# Patient Record
Sex: Female | Born: 1965 | Race: Black or African American | Hispanic: No | Marital: Single | Smoking: Current every day smoker
Health system: Southern US, Community
[De-identification: ages and names within clinical notes are randomized; demographics above are authoritative.]

## PROBLEM LIST (undated history)

## (undated) DIAGNOSIS — R569 Unspecified convulsions: Secondary | ICD-10-CM

## (undated) DIAGNOSIS — I639 Cerebral infarction, unspecified: Secondary | ICD-10-CM

## (undated) DIAGNOSIS — H539 Unspecified visual disturbance: Secondary | ICD-10-CM

## (undated) DIAGNOSIS — B20 Human immunodeficiency virus [HIV] disease: Secondary | ICD-10-CM

## (undated) DIAGNOSIS — Z21 Asymptomatic human immunodeficiency virus [HIV] infection status: Secondary | ICD-10-CM

## (undated) DIAGNOSIS — I10 Essential (primary) hypertension: Secondary | ICD-10-CM

## (undated) HISTORY — DX: Asymptomatic human immunodeficiency virus (hiv) infection status: Z21

## (undated) HISTORY — DX: Unspecified visual disturbance: H53.9

## (undated) HISTORY — DX: Essential (primary) hypertension: I10

## (undated) HISTORY — DX: Human immunodeficiency virus (HIV) disease: B20

## (undated) HISTORY — DX: Cerebral infarction, unspecified: I63.9

## (undated) HISTORY — DX: Unspecified convulsions: R56.9

---

## 1999-03-02 ENCOUNTER — Ambulatory Visit (HOSPITAL_COMMUNITY): Admission: RE | Admit: 1999-03-02 | Discharge: 1999-03-02 | Payer: Self-pay | Admitting: *Deleted

## 1999-03-14 ENCOUNTER — Encounter: Admission: RE | Admit: 1999-03-14 | Discharge: 1999-03-14 | Payer: Self-pay | Admitting: Obstetrics & Gynecology

## 1999-03-28 ENCOUNTER — Encounter: Admission: RE | Admit: 1999-03-28 | Discharge: 1999-03-28 | Payer: Self-pay | Admitting: Obstetrics & Gynecology

## 1999-04-11 ENCOUNTER — Encounter: Admission: RE | Admit: 1999-04-11 | Discharge: 1999-04-11 | Payer: Self-pay | Admitting: Obstetrics & Gynecology

## 1999-04-17 ENCOUNTER — Ambulatory Visit (HOSPITAL_COMMUNITY): Admission: RE | Admit: 1999-04-17 | Discharge: 1999-04-17 | Payer: Self-pay | Admitting: *Deleted

## 1999-04-25 ENCOUNTER — Inpatient Hospital Stay (HOSPITAL_COMMUNITY): Admission: AD | Admit: 1999-04-25 | Discharge: 1999-04-25 | Payer: Self-pay | Admitting: Obstetrics

## 1999-04-25 ENCOUNTER — Encounter (HOSPITAL_COMMUNITY): Admission: RE | Admit: 1999-04-25 | Discharge: 1999-05-14 | Payer: Self-pay | Admitting: Obstetrics & Gynecology

## 1999-04-25 ENCOUNTER — Encounter: Admission: RE | Admit: 1999-04-25 | Discharge: 1999-04-25 | Payer: Self-pay | Admitting: Obstetrics & Gynecology

## 1999-04-26 ENCOUNTER — Inpatient Hospital Stay (HOSPITAL_COMMUNITY): Admission: AD | Admit: 1999-04-26 | Discharge: 1999-04-26 | Payer: Self-pay | Admitting: Obstetrics & Gynecology

## 1999-04-30 ENCOUNTER — Inpatient Hospital Stay (HOSPITAL_COMMUNITY): Admission: AD | Admit: 1999-04-30 | Discharge: 1999-04-30 | Payer: Self-pay | Admitting: Obstetrics

## 1999-05-09 ENCOUNTER — Encounter: Admission: RE | Admit: 1999-05-09 | Discharge: 1999-05-09 | Payer: Self-pay | Admitting: Obstetrics & Gynecology

## 1999-05-12 ENCOUNTER — Inpatient Hospital Stay (HOSPITAL_COMMUNITY): Admission: AD | Admit: 1999-05-12 | Discharge: 1999-05-14 | Payer: Self-pay | Admitting: *Deleted

## 1999-05-12 ENCOUNTER — Inpatient Hospital Stay (HOSPITAL_COMMUNITY): Admission: AD | Admit: 1999-05-12 | Discharge: 1999-05-12 | Payer: Self-pay | Admitting: *Deleted

## 1999-05-12 ENCOUNTER — Encounter: Payer: Self-pay | Admitting: *Deleted

## 2000-02-14 ENCOUNTER — Emergency Department (HOSPITAL_COMMUNITY): Admission: EM | Admit: 2000-02-14 | Discharge: 2000-02-14 | Payer: Self-pay | Admitting: Emergency Medicine

## 2001-05-24 ENCOUNTER — Emergency Department (HOSPITAL_COMMUNITY): Admission: EM | Admit: 2001-05-24 | Discharge: 2001-05-24 | Payer: Self-pay | Admitting: Emergency Medicine

## 2005-04-04 ENCOUNTER — Encounter: Admission: RE | Admit: 2005-04-04 | Discharge: 2005-04-04 | Payer: Self-pay | Admitting: Obstetrics & Gynecology

## 2005-08-14 ENCOUNTER — Emergency Department (HOSPITAL_COMMUNITY): Admission: EM | Admit: 2005-08-14 | Discharge: 2005-08-14 | Payer: Self-pay | Admitting: Family Medicine

## 2005-09-26 ENCOUNTER — Emergency Department (HOSPITAL_COMMUNITY): Admission: EM | Admit: 2005-09-26 | Discharge: 2005-09-26 | Payer: Self-pay | Admitting: Family Medicine

## 2007-01-20 ENCOUNTER — Emergency Department (HOSPITAL_COMMUNITY): Admission: EM | Admit: 2007-01-20 | Discharge: 2007-01-20 | Payer: Self-pay | Admitting: Family Medicine

## 2007-01-30 ENCOUNTER — Ambulatory Visit: Payer: Self-pay | Admitting: Family Medicine

## 2007-01-30 ENCOUNTER — Encounter: Payer: Self-pay | Admitting: Internal Medicine

## 2007-01-30 LAB — CONVERTED CEMR LAB
HCV Ab: NEGATIVE
Hep B S Ab: NEGATIVE
Hepatitis B Surface Ag: NEGATIVE

## 2007-02-02 ENCOUNTER — Ambulatory Visit: Payer: Self-pay | Admitting: *Deleted

## 2007-02-19 ENCOUNTER — Encounter (INDEPENDENT_AMBULATORY_CARE_PROVIDER_SITE_OTHER): Payer: Self-pay | Admitting: Specialist

## 2007-02-19 ENCOUNTER — Encounter: Payer: Self-pay | Admitting: Internal Medicine

## 2007-02-19 ENCOUNTER — Ambulatory Visit: Payer: Self-pay | Admitting: Family Medicine

## 2007-02-19 ENCOUNTER — Encounter (INDEPENDENT_AMBULATORY_CARE_PROVIDER_SITE_OTHER): Payer: Self-pay | Admitting: Family Medicine

## 2007-02-19 LAB — CONVERTED CEMR LAB: Pap Smear: ABNORMAL

## 2007-03-12 ENCOUNTER — Ambulatory Visit: Payer: Self-pay | Admitting: Internal Medicine

## 2007-04-16 ENCOUNTER — Ambulatory Visit: Payer: Self-pay | Admitting: Internal Medicine

## 2007-05-30 ENCOUNTER — Emergency Department (HOSPITAL_COMMUNITY): Admission: EM | Admit: 2007-05-30 | Discharge: 2007-05-31 | Payer: Self-pay | Admitting: Emergency Medicine

## 2007-08-06 ENCOUNTER — Encounter: Payer: Self-pay | Admitting: Internal Medicine

## 2007-08-06 DIAGNOSIS — I10 Essential (primary) hypertension: Secondary | ICD-10-CM | POA: Insufficient documentation

## 2007-08-06 DIAGNOSIS — B20 Human immunodeficiency virus [HIV] disease: Secondary | ICD-10-CM | POA: Insufficient documentation

## 2007-09-02 ENCOUNTER — Encounter (INDEPENDENT_AMBULATORY_CARE_PROVIDER_SITE_OTHER): Payer: Self-pay | Admitting: *Deleted

## 2007-09-08 ENCOUNTER — Ambulatory Visit (HOSPITAL_COMMUNITY): Admission: RE | Admit: 2007-09-08 | Discharge: 2007-09-08 | Payer: Self-pay | Admitting: Family Medicine

## 2007-12-21 ENCOUNTER — Emergency Department (HOSPITAL_COMMUNITY): Admission: EM | Admit: 2007-12-21 | Discharge: 2007-12-21 | Payer: Self-pay | Admitting: Family Medicine

## 2007-12-24 ENCOUNTER — Ambulatory Visit: Payer: Self-pay | Admitting: Internal Medicine

## 2007-12-24 LAB — CONVERTED CEMR LAB
ALT: 55 units/L — ABNORMAL HIGH (ref 0–35)
AST: 49 units/L — ABNORMAL HIGH (ref 0–37)
Absolute CD4: 179 #/uL — ABNORMAL LOW (ref 381–1469)
Albumin: 3.7 g/dL (ref 3.5–5.2)
Alkaline Phosphatase: 67 units/L (ref 39–117)
BUN: 12 mg/dL (ref 6–23)
Basophils Absolute: 0 10*3/uL (ref 0.0–0.1)
Basophils Relative: 0 % (ref 0–1)
CD4 T Helper %: 9 % — ABNORMAL LOW (ref 32–62)
CO2: 22 meq/L (ref 19–32)
Calcium: 8.6 mg/dL (ref 8.4–10.5)
Chloride: 106 meq/L (ref 96–112)
Creatinine, Ser: 0.62 mg/dL (ref 0.40–1.20)
Eosinophils Absolute: 0.1 10*3/uL (ref 0.0–0.7)
Eosinophils Relative: 3 % (ref 0–5)
Glucose, Bld: 91 mg/dL (ref 70–99)
HCT: 40.3 % (ref 36.0–46.0)
HIV 1 RNA Quant: 101000 copies/mL — ABNORMAL HIGH (ref <50)
HIV-1 RNA Quant, Log: 5 — ABNORMAL HIGH (ref <1.70)
Hemoglobin: 13.2 g/dL (ref 12.0–15.0)
Lymphocytes Relative: 71 % — ABNORMAL HIGH (ref 12–46)
Lymphs Abs: 2 10*3/uL (ref 0.7–4.0)
MCHC: 32.8 g/dL (ref 30.0–36.0)
MCV: 86.5 fL (ref 78.0–100.0)
Monocytes Absolute: 0.3 10*3/uL (ref 0.1–1.0)
Monocytes Relative: 10 % (ref 3–12)
Neutro Abs: 0.5 10*3/uL — ABNORMAL LOW (ref 1.7–7.7)
Neutrophils Relative %: 16 % — ABNORMAL LOW (ref 43–77)
Platelets: 152 10*3/uL (ref 150–400)
Potassium: 3.9 meq/L (ref 3.5–5.3)
RBC: 4.66 M/uL (ref 3.87–5.11)
RDW: 13.5 % (ref 11.5–15.5)
Sodium: 140 meq/L (ref 135–145)
Total Bilirubin: 0.5 mg/dL (ref 0.3–1.2)
Total Lymphocytes %: 71 % — ABNORMAL HIGH (ref 12–46)
Total Protein: 8.7 g/dL — ABNORMAL HIGH (ref 6.0–8.3)
Total lymphocyte count: 1988 cells/mcL (ref 700–3300)
WBC, lymph enumeration: 2.8 10*3/uL — ABNORMAL LOW (ref 4.0–10.5)
WBC: 2.8 10*3/uL — ABNORMAL LOW (ref 4.0–10.5)

## 2008-01-07 ENCOUNTER — Ambulatory Visit: Payer: Self-pay | Admitting: Internal Medicine

## 2008-04-03 ENCOUNTER — Emergency Department (HOSPITAL_COMMUNITY): Admission: EM | Admit: 2008-04-03 | Discharge: 2008-04-04 | Payer: Self-pay | Admitting: Emergency Medicine

## 2008-07-22 ENCOUNTER — Emergency Department (HOSPITAL_COMMUNITY): Admission: EM | Admit: 2008-07-22 | Discharge: 2008-07-22 | Payer: Self-pay | Admitting: Emergency Medicine

## 2008-12-22 ENCOUNTER — Ambulatory Visit: Payer: Self-pay | Admitting: Internal Medicine

## 2008-12-22 LAB — CONVERTED CEMR LAB

## 2008-12-23 ENCOUNTER — Encounter: Payer: Self-pay | Admitting: Internal Medicine

## 2008-12-23 LAB — CONVERTED CEMR LAB
ALT: 32 units/L (ref 0–35)
AST: 38 units/L — ABNORMAL HIGH (ref 0–37)
Absolute CD4: 130 #/uL — ABNORMAL LOW (ref 381–1469)
Albumin: 3.9 g/dL (ref 3.5–5.2)
Alkaline Phosphatase: 65 units/L (ref 39–117)
BUN: 9 mg/dL (ref 6–23)
Basophils Absolute: 0 10*3/uL (ref 0.0–0.1)
Basophils Relative: 0 % (ref 0–1)
CD4 T Helper %: 9 % — ABNORMAL LOW (ref 32–62)
CO2: 20 meq/L (ref 19–32)
Calcium: 8.6 mg/dL (ref 8.4–10.5)
Chlamydia, Swab/Urine, PCR: NEGATIVE
Chloride: 104 meq/L (ref 96–112)
Creatinine, Ser: 0.74 mg/dL (ref 0.40–1.20)
Eosinophils Absolute: 0.1 10*3/uL (ref 0.0–0.7)
Eosinophils Relative: 5 % (ref 0–5)
GC Probe Amp, Urine: NEGATIVE
Glucose, Bld: 83 mg/dL (ref 70–99)
HCT: 40.9 % (ref 36.0–46.0)
HIV 1 RNA Quant: 269000 copies/mL — ABNORMAL HIGH (ref <48)
HIV-1 RNA Quant, Log: 5.43 — ABNORMAL HIGH (ref <1.68)
Hemoglobin: 13.7 g/dL (ref 12.0–15.0)
Lymphocytes Relative: 60 % — ABNORMAL HIGH (ref 12–46)
Lymphs Abs: 1.4 10*3/uL (ref 0.7–4.0)
MCHC: 33.5 g/dL (ref 30.0–36.0)
MCV: 83.5 fL (ref 78.0–100.0)
Monocytes Absolute: 0.2 10*3/uL (ref 0.1–1.0)
Monocytes Relative: 8 % (ref 3–12)
Neutro Abs: 0.7 10*3/uL — ABNORMAL LOW (ref 1.7–7.7)
Neutrophils Relative %: 28 % — ABNORMAL LOW (ref 43–77)
Platelets: 140 10*3/uL — ABNORMAL LOW (ref 150–400)
Potassium: 3.8 meq/L (ref 3.5–5.3)
RBC: 4.9 M/uL (ref 3.87–5.11)
RDW: 13.2 % (ref 11.5–15.5)
Sodium: 139 meq/L (ref 135–145)
Total Bilirubin: 0.7 mg/dL (ref 0.3–1.2)
Total Lymphocytes %: 60 % — ABNORMAL HIGH (ref 12–46)
Total Protein: 8.9 g/dL — ABNORMAL HIGH (ref 6.0–8.3)
Total lymphocyte count: 1440 cells/mcL (ref 700–3300)
WBC, lymph enumeration: 2.4 10*3/uL — ABNORMAL LOW (ref 4.0–10.5)
WBC: 2.4 10*3/uL — ABNORMAL LOW (ref 4.0–10.5)

## 2009-01-19 ENCOUNTER — Encounter: Payer: Self-pay | Admitting: Internal Medicine

## 2009-01-19 ENCOUNTER — Ambulatory Visit: Payer: Self-pay | Admitting: Internal Medicine

## 2009-01-19 DIAGNOSIS — J209 Acute bronchitis, unspecified: Secondary | ICD-10-CM | POA: Insufficient documentation

## 2009-03-07 ENCOUNTER — Ambulatory Visit: Payer: Self-pay | Admitting: Internal Medicine

## 2009-03-07 ENCOUNTER — Encounter: Payer: Self-pay | Admitting: Internal Medicine

## 2009-03-16 ENCOUNTER — Encounter: Payer: Self-pay | Admitting: Internal Medicine

## 2009-04-08 ENCOUNTER — Emergency Department (HOSPITAL_COMMUNITY): Admission: EM | Admit: 2009-04-08 | Discharge: 2009-04-08 | Payer: Self-pay | Admitting: Family Medicine

## 2009-04-20 ENCOUNTER — Encounter: Payer: Self-pay | Admitting: Internal Medicine

## 2009-12-28 ENCOUNTER — Ambulatory Visit: Payer: Self-pay | Admitting: Internal Medicine

## 2009-12-28 ENCOUNTER — Encounter: Payer: Self-pay | Admitting: Internal Medicine

## 2009-12-28 DIAGNOSIS — B370 Candidal stomatitis: Secondary | ICD-10-CM | POA: Insufficient documentation

## 2009-12-28 LAB — CONVERTED CEMR LAB
ALT: 14 units/L (ref 0–35)
AST: 23 units/L (ref 0–37)
Absolute CD4: 186 #/uL — ABNORMAL LOW (ref 381–1469)
Albumin: 3.7 g/dL (ref 3.5–5.2)
Alkaline Phosphatase: 65 units/L (ref 39–117)
BUN: 11 mg/dL (ref 6–23)
Basophils Absolute: 0.1 10*3/uL (ref 0.0–0.1)
Basophils Relative: 1 % (ref 0–1)
CD4 T Helper %: 12 % — ABNORMAL LOW (ref 32–62)
CO2: 25 meq/L (ref 19–32)
Calcium: 8.7 mg/dL (ref 8.4–10.5)
Chloride: 103 meq/L (ref 96–112)
Creatinine, Ser: 0.66 mg/dL (ref 0.40–1.20)
Eosinophils Absolute: 0.1 10*3/uL (ref 0.0–0.7)
Eosinophils Relative: 2 % (ref 0–5)
Glucose, Bld: 81 mg/dL (ref 70–99)
HCT: 43.2 % (ref 36.0–46.0)
HIV 1 RNA Quant: 126000 copies/mL — ABNORMAL HIGH (ref <48)
HIV-1 RNA Quant, Log: 5.1 — ABNORMAL HIGH (ref <1.68)
Hemoglobin: 14.7 g/dL (ref 12.0–15.0)
Lymphocytes Relative: 50 % — ABNORMAL HIGH (ref 12–46)
Lymphs Abs: 1.6 10*3/uL (ref 0.7–4.0)
MCHC: 34 g/dL (ref 30.0–36.0)
MCV: 84.9 fL (ref 78.0–100.0)
Monocytes Absolute: 0.2 10*3/uL (ref 0.1–1.0)
Monocytes Relative: 6 % (ref 3–12)
Neutro Abs: 1.3 10*3/uL — ABNORMAL LOW (ref 1.7–7.7)
Neutrophils Relative %: 41 % — ABNORMAL LOW (ref 43–77)
Platelets: 161 10*3/uL (ref 150–400)
Potassium: 3.9 meq/L (ref 3.5–5.3)
RBC: 5.09 M/uL (ref 3.87–5.11)
RDW: 13.1 % (ref 11.5–15.5)
Sodium: 140 meq/L (ref 135–145)
Total Bilirubin: 0.7 mg/dL (ref 0.3–1.2)
Total Protein: 8.6 g/dL — ABNORMAL HIGH (ref 6.0–8.3)
Total lymphocyte count: 1550 cells/mcL (ref 700–3300)
WBC: 3.1 10*3/uL — ABNORMAL LOW (ref 4.0–10.5)

## 2010-01-11 ENCOUNTER — Encounter: Payer: Self-pay | Admitting: Internal Medicine

## 2010-01-11 ENCOUNTER — Ambulatory Visit: Payer: Self-pay | Admitting: Internal Medicine

## 2010-01-30 ENCOUNTER — Encounter: Admission: RE | Admit: 2010-01-30 | Discharge: 2010-01-30 | Payer: Self-pay | Admitting: Internal Medicine

## 2010-02-22 ENCOUNTER — Encounter: Payer: Self-pay | Admitting: Internal Medicine

## 2010-02-22 ENCOUNTER — Ambulatory Visit: Payer: Self-pay | Admitting: Internal Medicine

## 2010-02-22 LAB — CONVERTED CEMR LAB
ALT: 14 units/L (ref 0–35)
AST: 18 units/L (ref 0–37)
Absolute CD4: 226 #/uL — ABNORMAL LOW (ref 381–1469)
Albumin: 3.6 g/dL (ref 3.5–5.2)
Alkaline Phosphatase: 78 units/L (ref 39–117)
BUN: 11 mg/dL (ref 6–23)
Basophils Absolute: 0 10*3/uL (ref 0.0–0.1)
Basophils Relative: 1 % (ref 0–1)
CD4 T Helper %: 14 % — ABNORMAL LOW (ref 32–62)
CO2: 24 meq/L (ref 19–32)
Calcium: 9.2 mg/dL (ref 8.4–10.5)
Chloride: 105 meq/L (ref 96–112)
Creatinine, Ser: 0.65 mg/dL (ref 0.40–1.20)
Eosinophils Absolute: 0.2 10*3/uL (ref 0.0–0.7)
Eosinophils Relative: 5 % (ref 0–5)
Glucose, Bld: 64 mg/dL — ABNORMAL LOW (ref 70–99)
HCT: 41.4 % (ref 36.0–46.0)
HIV 1 RNA Quant: 73 copies/mL — ABNORMAL HIGH (ref <48)
HIV-1 RNA Quant, Log: 1.86 — ABNORMAL HIGH (ref <1.68)
Hemoglobin: 13.9 g/dL (ref 12.0–15.0)
Lymphocytes Relative: 49 % — ABNORMAL HIGH (ref 12–46)
Lymphs Abs: 1.6 10*3/uL (ref 0.7–4.0)
MCHC: 33.6 g/dL (ref 30.0–36.0)
MCV: 85.7 fL (ref 78.0–100.0)
Monocytes Absolute: 0.3 10*3/uL (ref 0.1–1.0)
Monocytes Relative: 10 % (ref 3–12)
Neutro Abs: 1.2 10*3/uL — ABNORMAL LOW (ref 1.7–7.7)
Neutrophils Relative %: 36 % — ABNORMAL LOW (ref 43–77)
Platelets: 209 10*3/uL (ref 150–400)
Potassium: 4.2 meq/L (ref 3.5–5.3)
RBC: 4.83 M/uL (ref 3.87–5.11)
RDW: 14.1 % (ref 11.5–15.5)
Sodium: 139 meq/L (ref 135–145)
Total Bilirubin: 0.5 mg/dL (ref 0.3–1.2)
Total Protein: 8.1 g/dL (ref 6.0–8.3)
Total lymphocyte count: 1617 cells/mcL (ref 700–3300)
WBC: 3.3 10*3/uL — ABNORMAL LOW (ref 4.0–10.5)

## 2010-05-11 ENCOUNTER — Telehealth: Payer: Self-pay | Admitting: Internal Medicine

## 2010-05-21 ENCOUNTER — Ambulatory Visit: Payer: Self-pay | Admitting: Internal Medicine

## 2010-05-21 DIAGNOSIS — L0291 Cutaneous abscess, unspecified: Secondary | ICD-10-CM | POA: Insufficient documentation

## 2010-05-21 DIAGNOSIS — L0390 Cellulitis, unspecified: Secondary | ICD-10-CM

## 2010-05-21 DIAGNOSIS — R21 Rash and other nonspecific skin eruption: Secondary | ICD-10-CM | POA: Insufficient documentation

## 2010-05-21 LAB — CONVERTED CEMR LAB
ALT: 31 units/L (ref 0–35)
AST: 36 units/L (ref 0–37)
Albumin: 3.9 g/dL (ref 3.5–5.2)
Alkaline Phosphatase: 84 units/L (ref 39–117)
BUN: 12 mg/dL (ref 6–23)
Basophils Absolute: 0 10*3/uL (ref 0.0–0.1)
Basophils Relative: 0 % (ref 0–1)
CO2: 26 meq/L (ref 19–32)
Calcium: 9.1 mg/dL (ref 8.4–10.5)
Chloride: 106 meq/L (ref 96–112)
Cholesterol: 140 mg/dL (ref 0–200)
Creatinine, Ser: 0.74 mg/dL (ref 0.40–1.20)
Eosinophils Absolute: 0.1 10*3/uL (ref 0.0–0.7)
Eosinophils Relative: 2 % (ref 0–5)
Glucose, Bld: 90 mg/dL (ref 70–99)
HCT: 41.3 % (ref 36.0–46.0)
HDL: 42 mg/dL (ref >39)
HIV 1 RNA Quant: <48 copies/mL (ref <48)
HIV-1 RNA Quant, Log: <1.68 (ref <1.68)
Hemoglobin: 13.9 g/dL (ref 12.0–15.0)
LDL Cholesterol: 78 mg/dL (ref 0–99)
Lymphocytes Relative: 36 % (ref 12–46)
Lymphs Abs: 1.4 10*3/uL (ref 0.7–4.0)
MCHC: 33.7 g/dL (ref 30.0–36.0)
MCV: 88.2 fL (ref 78.0–100.0)
Monocytes Absolute: 0.3 10*3/uL (ref 0.1–1.0)
Monocytes Relative: 9 % (ref 3–12)
Neutro Abs: 2.1 10*3/uL (ref 1.7–7.7)
Neutrophils Relative %: 54 % (ref 43–77)
Platelets: 213 10*3/uL (ref 150–400)
Potassium: 3.9 meq/L (ref 3.5–5.3)
RBC: 4.68 M/uL (ref 3.87–5.11)
RDW: 12.8 % (ref 11.5–15.5)
Sodium: 140 meq/L (ref 135–145)
Total Bilirubin: 1.1 mg/dL (ref 0.3–1.2)
Total CHOL/HDL Ratio: 3.3
Total Protein: 8.2 g/dL (ref 6.0–8.3)
Triglycerides: 100 mg/dL (ref <150)
VLDL: 20 mg/dL (ref 0–40)
WBC: 3.9 10*3/uL — ABNORMAL LOW (ref 4.0–10.5)

## 2010-05-22 ENCOUNTER — Encounter: Payer: Self-pay | Admitting: Internal Medicine

## 2010-05-24 ENCOUNTER — Telehealth: Payer: Self-pay | Admitting: Internal Medicine

## 2010-06-11 ENCOUNTER — Telehealth: Payer: Self-pay | Admitting: Internal Medicine

## 2010-07-11 ENCOUNTER — Encounter: Payer: Self-pay | Admitting: Internal Medicine

## 2010-07-16 ENCOUNTER — Telehealth: Payer: Self-pay | Admitting: Internal Medicine

## 2010-07-19 ENCOUNTER — Ambulatory Visit: Payer: Self-pay | Admitting: Internal Medicine

## 2010-07-19 DIAGNOSIS — N912 Amenorrhea, unspecified: Secondary | ICD-10-CM | POA: Insufficient documentation

## 2010-08-23 ENCOUNTER — Telehealth: Payer: Self-pay | Admitting: Internal Medicine

## 2010-08-29 ENCOUNTER — Ambulatory Visit: Payer: Self-pay | Admitting: Internal Medicine

## 2010-08-29 DIAGNOSIS — D239 Other benign neoplasm of skin, unspecified: Secondary | ICD-10-CM | POA: Insufficient documentation

## 2010-08-29 LAB — CONVERTED CEMR LAB
ALT: 21 units/L (ref 0–35)
AST: 25 units/L (ref 0–37)
Albumin: 4 g/dL (ref 3.5–5.2)
Alkaline Phosphatase: 88 units/L (ref 39–117)
BUN: 15 mg/dL (ref 6–23)
Basophils Absolute: 0 10*3/uL (ref 0.0–0.1)
Basophils Relative: 0 % (ref 0–1)
CO2: 25 meq/L (ref 19–32)
Calcium: 8.9 mg/dL (ref 8.4–10.5)
Chloride: 105 meq/L (ref 96–112)
Creatinine, Ser: 0.74 mg/dL (ref 0.40–1.20)
Eosinophils Absolute: 0.1 10*3/uL (ref 0.0–0.7)
Eosinophils Relative: 3 % (ref 0–5)
Glucose, Bld: 79 mg/dL (ref 70–99)
HCT: 43.2 % (ref 36.0–46.0)
HIV 1 RNA Quant: <20 copies/mL (ref <20)
HIV-1 RNA Quant, Log: <1.3 (ref <1.30)
Hemoglobin: 14.7 g/dL (ref 12.0–15.0)
Lymphocytes Relative: 49 % — ABNORMAL HIGH (ref 12–46)
Lymphs Abs: 1.3 10*3/uL (ref 0.7–4.0)
MCHC: 34 g/dL (ref 30.0–36.0)
MCV: 89.4 fL (ref 78.0–100.0)
Monocytes Absolute: 0.3 10*3/uL (ref 0.1–1.0)
Monocytes Relative: 12 % (ref 3–12)
Neutro Abs: 0.9 10*3/uL — ABNORMAL LOW (ref 1.7–7.7)
Neutrophils Relative %: 36 % — ABNORMAL LOW (ref 43–77)
Platelets: 144 10*3/uL — ABNORMAL LOW (ref 150–400)
Potassium: 3.9 meq/L (ref 3.5–5.3)
RBC: 4.83 M/uL (ref 3.87–5.11)
RDW: 13 % (ref 11.5–15.5)
Sodium: 139 meq/L (ref 135–145)
Total Bilirubin: 1.2 mg/dL (ref 0.3–1.2)
Total Protein: 8 g/dL (ref 6.0–8.3)
WBC: 2.6 10*3/uL — ABNORMAL LOW (ref 4.0–10.5)

## 2010-09-14 ENCOUNTER — Telehealth: Payer: Self-pay | Admitting: Internal Medicine

## 2010-10-16 ENCOUNTER — Telehealth (INDEPENDENT_AMBULATORY_CARE_PROVIDER_SITE_OTHER): Payer: Self-pay | Admitting: *Deleted

## 2010-11-13 ENCOUNTER — Encounter (INDEPENDENT_AMBULATORY_CARE_PROVIDER_SITE_OTHER): Payer: Self-pay | Admitting: *Deleted

## 2010-11-14 ENCOUNTER — Telehealth (INDEPENDENT_AMBULATORY_CARE_PROVIDER_SITE_OTHER): Payer: Self-pay | Admitting: *Deleted

## 2010-12-24 ENCOUNTER — Ambulatory Visit: Admit: 2010-12-24 | Payer: Self-pay | Admitting: Internal Medicine

## 2010-12-26 ENCOUNTER — Ambulatory Visit
Admission: RE | Admit: 2010-12-26 | Discharge: 2010-12-26 | Payer: Self-pay | Source: Home / Self Care | Attending: Internal Medicine | Admitting: Internal Medicine

## 2010-12-26 ENCOUNTER — Encounter: Payer: Self-pay | Admitting: Internal Medicine

## 2010-12-26 DIAGNOSIS — N898 Other specified noninflammatory disorders of vagina: Secondary | ICD-10-CM | POA: Insufficient documentation

## 2010-12-26 LAB — CONVERTED CEMR LAB
ALT: 13 units/L (ref 0–35)
AST: 16 units/L (ref 0–37)
Albumin: 4 g/dL (ref 3.5–5.2)
Alkaline Phosphatase: 115 units/L (ref 39–117)
BUN: 11 mg/dL (ref 6–23)
Basophils Absolute: 0 10*3/uL (ref 0.0–0.1)
Basophils Relative: 0 % (ref 0–1)
CO2: 26 meq/L (ref 19–32)
Calcium: 8.7 mg/dL (ref 8.4–10.5)
Chloride: 106 meq/L (ref 96–112)
Creatinine, Ser: 0.76 mg/dL (ref 0.40–1.20)
Eosinophils Absolute: 0.1 10*3/uL (ref 0.0–0.7)
Eosinophils Relative: 4 % (ref 0–5)
Glucose, Bld: 87 mg/dL (ref 70–99)
HCT: 44.1 % (ref 36.0–46.0)
HIV 1 RNA Quant: <20 copies/mL (ref <20)
HIV-1 RNA Quant, Log: <1.3 (ref <1.30)
Hemoglobin: 14.8 g/dL (ref 12.0–15.0)
Lymphocytes Relative: 38 % (ref 12–46)
Lymphs Abs: 1.1 10*3/uL (ref 0.7–4.0)
MCHC: 33.6 g/dL (ref 30.0–36.0)
MCV: 91.7 fL (ref 78.0–100.0)
Monocytes Absolute: 0.3 10*3/uL (ref 0.1–1.0)
Monocytes Relative: 11 % (ref 3–12)
Neutro Abs: 1.4 10*3/uL — ABNORMAL LOW (ref 1.7–7.7)
Neutrophils Relative %: 46 % (ref 43–77)
Platelets: 175 10*3/uL (ref 150–400)
Potassium: 3.8 meq/L (ref 3.5–5.3)
RBC: 4.81 M/uL (ref 3.87–5.11)
RDW: 13.2 % (ref 11.5–15.5)
Sodium: 138 meq/L (ref 135–145)
Total Bilirubin: 0.8 mg/dL (ref 0.3–1.2)
Total Protein: 7.8 g/dL (ref 6.0–8.3)
WBC: 3 10*3/uL — ABNORMAL LOW (ref 4.0–10.5)

## 2010-12-27 ENCOUNTER — Encounter: Payer: Self-pay | Admitting: Internal Medicine

## 2010-12-27 LAB — CONVERTED CEMR LAB
Candida species: NEGATIVE
Gardnerella vaginalis: POSITIVE — AB

## 2010-12-31 LAB — T-HELPER CELL (CD4) - (RCID CLINIC ONLY)
CD4 % Helper T Cell: 17 % — ABNORMAL LOW (ref 33–55)
CD4 T Cell Abs: 180 uL — ABNORMAL LOW (ref 400–2700)

## 2011-01-14 ENCOUNTER — Encounter (INDEPENDENT_AMBULATORY_CARE_PROVIDER_SITE_OTHER): Payer: Self-pay | Admitting: *Deleted

## 2011-01-15 NOTE — Assessment & Plan Note (Signed)
Summary: f/u, labs in June /dde   CC:  follow-up visit, B/P elevated has been out of Troprol for one week, and c/o weight gain and recent mole on back.  History of Present Illness: Pt c/o weight gain.  She has been eating too much lately. She is trying to cut back. She has been out of her BP medication and needs a refill. She has been taking her HIV meds regularly. She has a mole on her back that she thinks is new.  Preventive Screening-Counseling & Management  Alcohol-Tobacco     Alcohol drinks/day: socially     Smoking Status: current     Smoke Cessation Stage: precontemplative     Packs/Day: 1/2  Caffeine-Diet-Exercise     Caffeine use/day: coffee 2 per day     Does Patient Exercise: no  Hep-HIV-STD-Contraception     HIV Risk: no     Sun Exposure-Excessive: occasionally  Safety-Violence-Falls     Seat Belt Use: 100      Sexual History:  currently monogamous.        Drug Use:  never and no.    Comments: pt. given condoms   Updated Prior Medication List: TOPROL XL 25 MG  TB24 (METOPROLOL SUCCINATE) Take 1 tablet by mouth once a day CLARITIN 10 MG TABS (LORATADINE) Take 1 tablet by mouth once a day TRUVADA 200-300 MG TABS (EMTRICITABINE-TENOFOVIR) Take 1 tablet by mouth once a day ISENTRESS 400 MG TABS (RALTEGRAVIR POTASSIUM) Take 1 tablet by mouth two times a day METROGEL-VAGINAL 0.75 % GEL (METRONIDAZOLE) use as directed  Current Allergies (reviewed today): No known allergies  Past History:  Past Medical History: Last updated: 08/06/2007 HIV disease Hypertension  Social History: Sexual History:  currently monogamous Drug Use:  never, no  Review of Systems       The patient complains of weight gain.  The patient denies anorexia, fever, chest pain, and headaches.    Vital Signs:  Patient profile:   45 year old female Menstrual status:  irregular Height:      64 inches (162.56 cm) Weight:      157.12 pounds (71.42 kg) BMI:     27.07 Temp:     98.1  degrees F (36.72 degrees C) oral Pulse rate:   75 / minute BP sitting:   169 / 111  (right arm)  Vitals Entered By: Wendall Mola CMA Duncan Dull) (August 29, 2010 3:13 PM) CC: follow-up visit, B/P elevated has been out of Troprol for one week, c/o weight gain and recent mole on back Is Patient Diabetic? No Pain Assessment Patient in pain? no      Nutritional Status BMI of 25 - 29 = overweight Nutritional Status Detail appetite "good"  Does patient need assistance? Functional Status Self care Ambulation Normal Comments pt. missed two doses of meds since last visit   Physical Exam  General:  alert, well-developed, well-nourished, and well-hydrated.   Head:  normocephalic and atraumatic.   Mouth:  pharynx pink and moist.   Lungs:  normal breath sounds.   Skin:  slightly irregular 1cm mole on upper right back   Impression & Recommendations:  Problem # 1:  NEVUS (ICD-216.9) refer to Derm Orders: Dermatology Referral ([facility]erm[facility])  Problem # 2:  HYPERTENSION (ICD-401.9) re-start Toprol Her updated medication list for this problem includes:    Toprol Xl 25 Mg Tb24 (Metoprolol succinate) .Marland Kitchen... Take 1 tablet by mouth once a day  Problem # 3:  HIV DISEASE (ICD-042) Will obtain labs today.  Pt to continue current meds. Influenza vaccine given. Orders: T-CBC w/Diff [phone]) T-CD4SP [facility]) (CD4SP) T-Comprehensive Metabolic Panel [phone]) T-HIV Viral Load [phone]) Est. Patient Level III (57846)  Diagnostics Reviewed:  HIV: CDC-defined AIDS (12/28/2009)   CD4: 200 (05/22/2010)   WBC: 3.9 (05/21/2010)   Hgb: 13.9 (05/21/2010)   HCT: 41.3 (05/21/2010)   Platelets: 213 (05/21/2010) HIV genotype: See Comment (12/28/2009)   HIV-1 RNA: <48 copies/mL (05/21/2010)   HBSAg: NEGATIVE (01/30/2007)  Other Orders: Influenza Vaccine NON MCR (96295)  Patient Instructions: 1)  Please schedule a follow-up appointment in 3 months, 2 weeks after labs. Prescriptions: TOPROL  XL 25 MG  TB24 (METOPROLOL SUCCINATE) Take 1 tablet by mouth once a day  #30 x 5   Entered and Authorized by:   Yisroel Ramming MD   Signed by:   Yisroel Ramming MD on 08/29/2010   Method used:   Print then Give to Patient   RxID:   [id number]    Immunizations Administered:  Influenza Vaccine # 1:    Vaccine Type: Fluvax Non-MCR    Site: right deltoid    Mfr: Novartis    Dose: 0.5 ml    Route: IM    Given by: Wendall Mola CMA ( AAMA)    Exp. Date: 03/17/2011    Lot #: [id number]    VIS given: 07/10/10 version given August 29, 2010.  Flu Vaccine Consent Questions:    Do you have a history of severe allergic reactions to this vaccine? no    Any prior history of allergic reactions to egg and/or gelatin? no    Do you have a sensitivity to the preservative Thimersol? no    Do you have a past history of Guillan-Barre Syndrome? no    Do you currently have an acute febrile illness? no    Have you ever had a severe reaction to latex? no    Vaccine information given and explained to patient? yes    Are you currently pregnant? no

## 2011-01-15 NOTE — Progress Notes (Signed)
Summary: [facility]AP/pt assist meds arrived for Jun  Phone Note Refill Request      Prescriptions: ISENTRESS 400 MG TABS (RALTEGRAVIR POTASSIUM) Take 1 tablet by mouth two times a day  #60 x 0   Entered by:   Paulo Fruit  BS,CPht II,MPH   Authorized by:   Yisroel Ramming MD   Signed by:   Paulo Fruit  BS,CPht II,MPH on 06/11/2010   Method used:   Samples Given   RxID:   [id number] TRUVADA 200-300 MG TABS (EMTRICITABINE-TENOFOVIR) Take 1 tablet by mouth once a day  #30 x 0   Entered by:   Paulo Fruit  BS,CPht II,MPH   Authorized by:   Yisroel Ramming MD   Signed by:   Paulo Fruit  BS,CPht II,MPH on 06/11/2010   Method used:   Samples Given   RxID:   [id number] BACTRIM DS 800-160 MG  TABS (SULFAMETHOXAZOLE-TRIMETHOPRIM) Take 1 tablet by mouth once a day  #30 x 0   Entered by:   Paulo Fruit  BS,CPht II,MPH   Authorized by:   Yisroel Ramming MD   Signed by:   Paulo Fruit  BS,CPht II,MPH on 06/11/2010   Method used:   Samples Given   RxID:   [id number]  Patient Assist Medication Verification: Medication name: Isentress 400mg  RX # [id number] Tech approval:MLD  Patient Assist Medication Verification: Medication name:Truvada RX # [id number] Tech approval:MLD  Patient Assist Medication Verification: Medication name:sulfameth/Trimethoprim 800/160mg  RX # [id number] Tech approval:MLD Call placed to patient with message that assistance medications are ready for pick-up. Left message for patient on her cell phone per her request. Paulo Fruit  BS,CPht II,MPH  June 11, 2010 10:16 AM   Appended Document: [facility]ADAP/pt assist meds arrived for Jun Pt. came to clinic to pick up medication, Isentress, Truvada, and Bactrim

## 2011-01-15 NOTE — Miscellaneous (Signed)
Summary: HIPAA Restrictions  HIPAA Restrictions   Imported By: Florinda Marker 05/22/2010 09:33:55  _____________________________________________________________________  External Attachment:    Type:   Image     Comment:   External Document

## 2011-01-15 NOTE — Miscellaneous (Signed)
Summary: RW Update  Clinical Lists Changes  Observations: Added new observation of DATE1STVISIT: 05/21/2010 (07/11/2010 13:54)

## 2011-01-15 NOTE — Progress Notes (Signed)
Summary: NCADAP med arrived for Sept--left msg for pt to call  Phone Note Refill Request      Prescriptions: ISENTRESS 400 MG TABS (RALTEGRAVIR POTASSIUM) Take 1 tablet by mouth two times a day  #60 Each x 0   Entered by:   Paulo Fruit  BS,CPht II,MPH   Authorized by:   Yisroel Ramming MD   Signed by:   Paulo Fruit  BS,CPht II,MPH on 08/23/2010   Method used:   Samples Given   RxID:   [id number] TRUVADA 200-300 MG TABS (EMTRICITABINE-TENOFOVIR) Take 1 tablet by mouth once a day  #30 Each x 0   Entered by:   Paulo Fruit  BS,CPht II,MPH   Authorized by:   Yisroel Ramming MD   Signed by:   Paulo Fruit  BS,CPht II,MPH on 08/23/2010   Method used:   Samples Given   RxID:   [id number]  Patient Assist Medication Verification: Medication name: Truvada RX # [id number] Tech approval:MLD  Patient Assist Medication Verification: Medication name:Isentress 400mg  RX # [id number] Tech approval:MLD Call placed to patient with message that assistance medications are ready for pick-up. Left message for patient to call office. Paulo Fruit  BS,CPht II,MPH  August 23, 2010 12:23 PM

## 2011-01-15 NOTE — Miscellaneous (Signed)
Summary: Office Visit ([facility])    Vital Signs:  Patient profile:   45 year old female Menstrual status:  irregular LMP:     02/13/2009 Weight:      128.2 pounds Temp:     98.4 degrees F oral Pulse rate:   102 / minute Pulse rhythm:   regular Resp:     17 per minute BP sitting:   136 / 100  (left arm)  Vitals Entered By: Sharen Heck RN (December 28, 2009 3:40 PM) CC: f/u 05, 0ut of 05 and BP meds x 1 month/ intermittent rash on face, Thrush, intermittent vaginal itching Is Patient Diabetic? No Pain Assessment Patient in pain? no       Does patient need assistance? Functional Status Self care Ambulation Normal LMP (date): 02/13/2009     Menstrual Status irregular Enter LMP: 02/13/2009 Last PAP Result ATYPICAL SQUAMOUS CELLS OF UNDETERMINED SIGNIFICANCE   CC:  f/u 05, 0ut of 05 and BP meds x 1 month/ intermittent rash on face, Thrush, and intermittent vaginal itching.  History of Present Illness: Pt has been out of her meds for awhile.  She had gotten a job and insurance and thus was not able to be seen here.  She did not seek care elsewhere. She c/o thrush which made it difficult to eat so she lost 8 lbs. Pt also c/o runny nose and pruritic rash on her face.  Preventive Screening-Counseling & Management  Alcohol-Tobacco     Alcohol drinks/day: 1     Smoking Status: current     Packs/Day: 1/2  Caffeine-Diet-Exercise     Caffeine use/day: 2     Does Patient Exercise: no  Current Problems (verified): 1)  Acute Bronchitis  (ICD-466.0) 2)  Hypertension  (ICD-401.9) 3)  HIV Disease  (ICD-042)  Current Medications (verified): 1)  Atripla 600-200-300 Mg Tabs (Efavirenz-Emtricitab-Tenofovir) .... Take 1 Tablet By Mouth Once A Day 2)  Bactrim Ds 800-160 Mg  Tabs (Sulfamethoxazole-Trimethoprim) .... Take 1 Tablet By Mouth Once A Day 3)  Toprol Xl 25 Mg  Tb24 (Metoprolol Succinate) .... Take 1 Tablet By Mouth Once A Day 4)  Claritin 10 Mg Tabs (Loratadine)  .... Take 1 Tablet By Mouth Once A Day 5)  Fluconazole 100 Mg Tabs (Fluconazole) .... Take 1 Tablet By Mouth Once A Day  Allergies (verified): No Known Drug Allergies   Additional History Menstrual Status:  irregular  Review of Systems       The patient complains of weight loss.  The patient denies anorexia, fever, and prolonged cough.     Physical Exam  General:  alert, well-developed, well-nourished, and well-hydrated.   Head:  normocephalic and atraumatic.   Mouth:  thrush present Lungs:  normal breath sounds.   Skin:  papular rash on face        Medication Adherence: 12/28/2009   Adherence to medications reviewed with patient. Counseling to provide adequate adherence provided   Prevention For Positives: 12/28/2009   Safe sex practices discussed with patient. Condoms offered.                             Impression & Recommendations:  Problem # 1:  HIV DISEASE (ICD-042) Will obtain labs today and re-enroll in ADAP so we can get her Atripla. Influenza vaccine given.  F/ in 2 weeks. Re-start her BActrim for PCP prophylaxis Diagnostics Reviewed:  CD4: 1.7.10 (12/22/2008)   WBC: 2.4 (12/23/2008)   Hgb: 13.7 (  12/23/2008)   HCT: 40.9 (12/23/2008)   Platelets: 140 (12/23/2008) HIV genotype: See Comment (12/23/2008)   HIV-1 RNA: 161096 (12/23/2008)   HBSAg: NEGATIVE (01/30/2007)  Orders: T-HIV1 Quant rflx Ultra or Genotype ([id number])  Problem # 2:  THRUSH (ICD-112.0) fluconazole for 10 days.  Problem # 3:  HYPERTENSION (ICD-401.9) re-start Toprol Her updated medication list for this problem includes:    Toprol Xl 25 Mg Tb24 (Metoprolol succinate) .Marland Kitchen... Take 1 tablet by mouth once a day  Problem # 4:  PREVENTIVE HEALTH CARE (ICD-V70.0) schedule mammogram  Medications Added to Medication List This Visit: 1)  Claritin 10 Mg Tabs (Loratadine) .... Take 1 tablet by mouth once a day 2)  Fluconazole 100 Mg Tabs (Fluconazole) .... Take 1 tablet by mouth once a  day  Other Orders: T-CD4SP [facility]) (CD4SP) T-Comprehensive Metabolic Panel [phone]) T-CBC w/Diff [phone]) T-RPR (Syphilis) ([id number]) Est. Patient Level III (44010)    Patient Instructions: 1)  Please schedule a follow-up appointment in 2 weeks. Prescriptions: FLUCONAZOLE 100 MG TABS (FLUCONAZOLE) Take 1 tablet by mouth once a day  #10 x 0   Entered and Authorized by:   Yisroel Ramming MD   Signed by:   Yisroel Ramming MD on 12/28/2009   Method used:   Print then Give to Patient   RxID:   [id number] CLARITIN 10 MG TABS (LORATADINE) Take 1 tablet by mouth once a day  #30 x 5   Entered and Authorized by:   Yisroel Ramming MD   Signed by:   Yisroel Ramming MD on 12/28/2009   Method used:   Print then Give to Patient   RxID:   [id number] TOPROL XL 25 MG  TB24 (METOPROLOL SUCCINATE) Take 1 tablet by mouth once a day  #30 x 5   Entered and Authorized by:   Yisroel Ramming MD   Signed by:   Yisroel Ramming MD on 12/28/2009   Method used:   Print then Give to Patient   RxID:   [id number] BACTRIM DS 800-160 MG  TABS (SULFAMETHOXAZOLE-TRIMETHOPRIM) Take 1 tablet by mouth once a day  #30 x 5   Entered and Authorized by:   Yisroel Ramming MD   Signed by:   Yisroel Ramming MD on 12/28/2009   Method used:   Print then Give to Patient   RxID:   [id number] ATRIPLA 600-200-300 MG TABS (EFAVIRENZ-EMTRICITAB-TENOFOVIR) Take 1 tablet by mouth once a day  #30 x 5   Entered and Authorized by:   Yisroel Ramming MD   Signed by:   Yisroel Ramming MD on 12/28/2009   Method used:   Print then Give to Patient   RxID:   [id number]   Appended Document: Office Visit ([facility])     Clinical Lists Changes  Orders: Added new Service order of Influenza Vaccine NON MCR (37106) - Signed Observations: Added new observation of FLU VAX#1VIS: 07/09/07 version given December 29, 2009. (12/29/2009 15:14) Added new observation of FLU VAXLOT: [id number] (12/29/2009 15:14) Added new  observation of FLU VAX EXP: 06/14/2010 (12/29/2009 15:14) Added new observation of FLU VAXBY: Sharen Heck RN (12/29/2009 15:14) Added new observation of FLU VAXRTE: IM (12/29/2009 15:14) Added new observation of FLU VAX DSE: 0.5 ml (12/29/2009 15:14) Added new observation of FLU VAXMFR: Sanofi Pasteur (12/29/2009 15:14) Added new observation of FLU VAX SITE: left deltoid (12/29/2009 15:14) Added new observation of FLU VAX: Fluvax Non-MCR (12/29/2009 15:14)       Influenza Vaccine    Vaccine Type: Fluvax Non-MCR  Site: left deltoid    Mfr: Sanofi Pasteur    Dose: 0.5 ml    Route: IM    Given by: Sharen Heck RN    Exp. Date: 06/14/2010    Lot #: [id number]    VIS given: 07/09/07 version given December 29, 2009.

## 2011-01-15 NOTE — Progress Notes (Signed)
Summary: NCADAP/pt assist one time med arrived for Jun  Phone Note Refill Request      Prescriptions: ZITHROMAX Z-PAK 250 MG TABS (AZITHROMYCIN) take as directed  #1 pack x 0   Entered by:   Paulo Fruit  BS,CPht II,MPH   Authorized by:   Yisroel Ramming MD   Signed by:   Paulo Fruit  BS,CPht II,MPH on 05/24/2010   Method used:   Samples Given   RxID:   [id number]  Patient Assist Medication Verification: Medication name: Azithromycin 250mg  (Zpak) RX # [id number] Tech approval:MLD Call placed to patient with message that assistance medications are ready for pick-up. Left message on pt's cell phone per patient's request Paulo Fruit  BS,CPht II,MPH  May 24, 2010 10:00 AM

## 2011-01-15 NOTE — Progress Notes (Signed)
Summary: Pregnant?  Phone Note Call from Patient   Caller: Patient Summary of Call: Patient called about medications.  She was told they are ready for pick up.  She also asked if we do pregnancy test here.  I told her yes that when she comes I can see if the nurse can fit her in the schedule. Initial call taken by: Paulo Fruit  BS,CPht II,MPH,  July 16, 2010 9:35 AM

## 2011-01-15 NOTE — Progress Notes (Signed)
Summary: ADAP rxes ready for pickup.  Pt. notified  Phone Note Outgoing Call   Call placed by: Jennet Maduro RN,  November 14, 2010 4:56 PM Call placed to: Patient Action Taken: Assistance medications ready for pick up Summary of Call: ADAP rxes ready for pickup.  Pt. notified.  Jennet Maduro RN  November 14, 2010 4:57 PM     Prescriptions: ISENTRESS 400 MG TABS (RALTEGRAVIR POTASSIUM) Take 1 tablet by mouth two times a day  #60 Each x 0   Entered by:   Jennet Maduro RN   Authorized by:   Yisroel Ramming MD   Signed by:   Jennet Maduro RN on 11/14/2010   Method used:   Samples Given   RxID:   [id number] TRUVADA 200-300 MG TABS (EMTRICITABINE-TENOFOVIR) Take 1 tablet by mouth once a day  #30 Each x 0   Entered by:   Jennet Maduro RN   Authorized by:   Yisroel Ramming MD   Signed by:   Jennet Maduro RN on 11/14/2010   Method used:   Samples Given   RxID:   [id number]   Appended Document: ADAP rxes ready for pickup.  Pt. notified Pt. picked up rx.

## 2011-01-15 NOTE — Progress Notes (Signed)
Summary: [facility]/pt assist meds arrived for Aug  Phone Note Refill Request      Prescriptions: TRUVADA 200-300 MG TABS (EMTRICITABINE-TENOFOVIR) Take 1 tablet by mouth once a day  #30 Each x 0   Entered by:   Paulo Fruit  BS,CPht II,MPH   Authorized by:   Yisroel Ramming MD   Signed by:   Paulo Fruit  BS,CPht II,MPH on 07/16/2010   Method used:   Samples Given   RxID:   [id number] TOPROL XL 25 MG  TB24 (METOPROLOL SUCCINATE) Take 1 tablet by mouth once a day  #30 x 0   Entered by:   Paulo Fruit  BS,CPht II,MPH   Authorized by:   Yisroel Ramming MD   Signed by:   Paulo Fruit  BS,CPht II,MPH on 07/16/2010   Method used:   Samples Given   RxID:   [id number] BACTRIM DS 800-160 MG  TABS (SULFAMETHOXAZOLE-TRIMETHOPRIM) Take 1 tablet by mouth once a day  #30 Each x 0   Entered by:   Paulo Fruit  BS,CPht II,MPH   Authorized by:   Yisroel Ramming MD   Signed by:   Paulo Fruit  BS,CPht II,MPH on 07/16/2010   Method used:   Samples Given   RxID:   [id number]  Patient Assist Medication Verification: Medication name:sulfameth/trimethoprim 800/160mg  RX # [id number] Tech approval:MLD  Patient Assist Medication Verification: Medication name:Isentress 400mg  RX # [id number] Tech approval:MLD  Patient Assist Medication Verification: Medication name:truvada RX # [id number] Tech approval:MLD  Patient called to see if medication is available for pick up. Paulo Fruit  BS,CPht II,MPH  July 16, 2010 9:33 AM   Appended Document: [facility]/pt assist meds arrived for Aug Prescription/Samples picked up by: patient

## 2011-01-15 NOTE — Miscellaneous (Signed)
Summary: Office Visit ([facility])    Vital Signs:  Patient profile:   45 year old female Menstrual status:  irregular Weight:      132.1 pounds Temp:     98.4 degrees F oral Pulse rate:   73 / minute Pulse rhythm:   regular Resp:     19 per minute BP sitting:   142 / 94  (left arm)  Vitals Entered By: Sharen Heck RN (February 22, 2010 4:06 PM) CC: f/u 05, needs Toprol refill,mucus in chest, occ speck of blood when wiping anfer having a bm, reports possible hemorrhoids/ productive cough o f clear and dark green phlegm Is Patient Diabetic? No Pain Assessment Patient in pain? no       Does patient need assistance? Functional Status Self care Ambulation Normal   CC:  f/u 05, needs Toprol refill, mucus in chest, occ speck of blood when wiping anfer having a bm, and reports possible hemorrhoids/ productive cough o f clear and dark green phlegm.  History of Present Illness: Pt her for f/u.  She is tolerating her HIV meds well and overall feels better. She c/o cough productive of yeloow thick sputum for about a week.  Preventive Screening-Counseling & Management  Alcohol-Tobacco     Alcohol drinks/day: 1     Smoking Status: current     Smoke Cessation Stage: precontemplative     Packs/Day: 1/2  Caffeine-Diet-Exercise     Caffeine use/day: 2     Does Patient Exercise: no  Current Problems (verified): 1)  Preventive Health Care  (ICD-V70.0) 2)  Thrush  (ICD-112.0) 3)  Acute Bronchitis  (ICD-466.0) 4)  Hypertension  (ICD-401.9) 5)  HIV Disease  (ICD-042)  Current Medications (verified): 1)  Bactrim Ds 800-160 Mg  Tabs (Sulfamethoxazole-Trimethoprim) .... Take 1 Tablet By Mouth Once A Day 2)  Toprol Xl 25 Mg  Tb24 (Metoprolol Succinate) .... Take 1 Tablet By Mouth Once A Day 3)  Claritin 10 Mg Tabs (Loratadine) .... Take 1 Tablet By Mouth Once A Day 4)  Truvada 200-300 Mg Tabs (Emtricitabine-Tenofovir) .... Take 1 Tablet By Mouth Once A Day 5)  Isentress 400 Mg Tabs  (Raltegravir Potassium) .... Take 1 Tablet By Mouth Two Times A Day 6)  Zithromax Z-Pak 250 Mg Tabs (Azithromycin) .... Take As Directed  Allergies (verified): No Known Drug Allergies   Review of Systems  The patient denies fever, dyspnea on exertion, and hemoptysis.     Physical Exam  General:  alert, well-developed, well-nourished, and well-hydrated.   Head:  normocephalic and atraumatic.   Mouth:  pharynx pink and moist.   Lungs:  few crackles bilaterally    Impression & Recommendations:  Problem # 1:  HIV DISEASE (ICD-042) Will obtain labs today and have her f/u in 2 weeks. Diagnostics Reviewed:  HIV: CDC-defined AIDS (12/28/2009)   CD4: 1.7.10 (12/22/2008)   WBC: 3.1 (12/28/2009)   Hgb: 14.7 (12/28/2009)   HCT: 43.2 (12/28/2009)   Platelets: 161 (12/28/2009) HIV genotype: See Comment (12/28/2009)   HIV-1 RNA: 126000 (12/28/2009)   HBSAg: NEGATIVE (01/30/2007)  Problem # 2:  ACUTE BRONCHITIS (ICD-466.0) treat witha z-pack  Medications Added to Medication List This Visit: 1)  Zithromax Z-pak 250 Mg Tabs (Azithromycin) .... Take as directed  Other Orders: T-CD4SP ([facility]) (CD4SP) T-HIV Viral Load [phone]) T-Comprehensive Metabolic Panel [phone]) T-CBC w/Diff ([id number]) Est. Patient Level III (33295)    Patient Instructions: 1)  Please schedule a follow-up appointment in 2 weeks. Prescriptions: ZITHROMAX Z-PAK 250 MG  TABS (AZITHROMYCIN) take as directed  #1 pack x 0   Entered and Authorized by:   Yisroel Ramming MD   Signed by:   Yisroel Ramming MD on 02/22/2010   Method used:   Print then Give to Patient   RxID:   [id number]

## 2011-01-15 NOTE — Miscellaneous (Signed)
Summary: RW Update  Clinical Lists Changes  Observations: Added new observation of DATE1STVISIT: 06/14/2010 (07/11/2010 13:50) Added new observation of RWPARTICIP: Yes (07/11/2010 13:50)

## 2011-01-15 NOTE — Miscellaneous (Signed)
Summary: Orders Update  Clinical Lists Changes  Problems: Added new problem of AMENORRHEA (ICD-626.0) Orders: Added new Test order of T-Urine Pregnancy (in -house) [phone]) - Signed  Appended Document: Orders Update  Laboratory Results   Urine Tests  Date/Time Received: Mariea Clonts  July 19, 2010 3:32 PM  Date/Time Reported: Mariea Clonts  July 19, 2010 3:32 PM     Urine HCG: negative

## 2011-01-15 NOTE — Progress Notes (Signed)
Summary: ncladap meds arrived for sept-pt aware  Phone Note Refill Request      Prescriptions: ISENTRESS 400 MG TABS (RALTEGRAVIR POTASSIUM) Take 1 tablet by mouth two times a day  #60 Each x 0   Entered by:   Paulo Fruit  BS,CPht II,MPH   Authorized by:   Yisroel Ramming MD   Signed by:   Paulo Fruit  BS,CPht II,MPH on 09/14/2010   Method used:   Samples Given   RxID:   [id number] TRUVADA 200-300 MG TABS (EMTRICITABINE-TENOFOVIR) Take 1 tablet by mouth once a day  #30 Each x 0   Entered by:   Paulo Fruit  BS,CPht II,MPH   Authorized by:   Yisroel Ramming MD   Signed by:   Paulo Fruit  BS,CPht II,MPH on 09/14/2010   Method used:   Samples Given   RxID:   [id number]  Patient Assist Medication Verification: Medication name: [name] # [id number] Tech approval:mld  Patient Assist Medication Verification: Medication name:isentress 400mg  RX # [id number] Tech approval:mld Call placed to patient with message that assistance medications are ready for pick-up. Paulo Fruit  BS,CPht II,MPH  September 14, 2010 3:15 PM  Appended Document: ncladap meds arrived for sept-pt aware Upon patient request medications from ADAP was givien to another patient [name].

## 2011-01-15 NOTE — Progress Notes (Signed)
Summary: ADAP meds ready for pick up  Phone Note Outgoing Call   Call placed by: Annice Pih Summary of Call: pt. notified ADAP meds ready for pick up, Isentress and Truvada Initial call taken by: Wendall Mola CMA Duncan Dull),  October 16, 2010 12:29 PM

## 2011-01-15 NOTE — Progress Notes (Signed)
Summary: Request a prescription for Metrogel Vaginal  Phone Note Outgoing Call   Call placed by: Paulo Fruit  BS,CPht II,MPH,  May 15, 2010 10:19 AM Call placed to: Patient Reason for Call: Get patient information Summary of Call: tried to contact patient per Dr. Philipp Deputy; Left a message for her to contact the office. Initial call taken by: Paulo Fruit  BS,CPht II,MPH,  May 15, 2010 10:20 AM Caller: [facility] Reason for Call: Needs renewal Summary of Call: Received a fax refill request for Metrogel Vaginal 0.7% Directions unknown.  It was last filled on 03/10/2009.  It is not listed in the EMR in her active or inactive profile.   Initial call taken by: Paulo Fruit  BS,CPht II,MPH,  May 11, 2010 3:08 PM  Follow-up for Phone Call        what does she want it for - does she think she has an infection?  patient called back and stated that she is having some irriation, but it's not bad. Follow-up by: Yisroel Ramming MD,  May 11, 2010 3:12 PM  Additional Follow-up for Phone Call Additional follow up Details #1::        tried to contact patient and was unable to reach her to find out the situation.  Will try again on Tuesday. Additional Follow-up by: Paulo Fruit  BS,CPht II,MPH,  May 11, 2010 4:53 PM

## 2011-01-15 NOTE — Miscellaneous (Signed)
  Clinical Lists Changes  Observations: Added new observation of YEARAIDSPOS: 2009  (11/13/2010 11:13)

## 2011-01-15 NOTE — Assessment & Plan Note (Signed)
Summary: COUGH/VS   CC:  pt. c/o cough and congestion x 1 week and draining sores on upper thighs and rash on back x 1 month.  History of Present Illness: Pt c/o cough for about 3 days productive of yellow sputum.  Fever initially but not now. she would also like a refill of her metrogel for vaginal itching.  She also c/o pruritic rash on her back that comes and goes.  Preventive Screening-Counseling & Management  Alcohol-Tobacco     Alcohol drinks/day: 1     Smoking Status: current     Smoke Cessation Stage: precontemplative     Packs/Day: 1/2  Caffeine-Diet-Exercise     Caffeine use/day: 2     Does Patient Exercise: no  Hep-HIV-STD-Contraception     HIV Risk: no     Sun Exposure-Excessive: occasionally  Safety-Violence-Falls     Seat Belt Use: 100      Drug Use:  no.    Comments: pt. given condoms   Updated Prior Medication List: BACTRIM DS 800-160 MG  TABS (SULFAMETHOXAZOLE-TRIMETHOPRIM) Take 1 tablet by mouth once a day TOPROL XL 25 MG  TB24 (METOPROLOL SUCCINATE) Take 1 tablet by mouth once a day CLARITIN 10 MG TABS (LORATADINE) Take 1 tablet by mouth once a day TRUVADA 200-300 MG TABS (EMTRICITABINE-TENOFOVIR) Take 1 tablet by mouth once a day ISENTRESS 400 MG TABS (RALTEGRAVIR POTASSIUM) Take 1 tablet by mouth two times a day METROGEL-VAGINAL 0.75 % GEL (METRONIDAZOLE) use as directed ZITHROMAX Z-PAK 250 MG TABS (AZITHROMYCIN) take as directed TRIAMCINOLONE ACETONIDE 0.1 % CREA (TRIAMCINOLONE ACETONIDE) apply two times a day  Current Allergies (reviewed today): No known allergies  Past History:  Past Medical History: Last updated: 08/06/2007 HIV disease Hypertension  Review of Systems  The patient denies anorexia, dyspnea on exertion, and hemoptysis.    Vital Signs:  Patient profile:   45 year old female Menstrual status:  irregular Height:      64 inches (162.56 cm) Weight:      142.8 pounds (64.91 kg) BMI:     24.60 Temp:     98.2 degrees F  (36.78 degrees C) oral Pulse rate:   98 / minute BP sitting:   152 / 105  (right arm)  Vitals Entered By: Wendall Mola CMA Duncan Dull) (May 21, 2010 3:55 PM) CC: pt. c/o cough and congestion x 1 week and draining sores on upper thighs, rash on back x 1 month Is Patient Diabetic? No Pain Assessment Patient in pain? no      Nutritional Status BMI of 19 -24 = normal Nutritional Status Detail appetite "low"  Does patient need assistance? Functional Status Self care Ambulation Normal Comments B/P elevated, did not take B/P med. today no missed doses of HAART meds   Physical Exam  General:  alert, well-developed, well-nourished, and well-hydrated.  alert, well-developed, well-nourished, and well-hydrated.   Head:  normocephalic and atraumatic.  normocephalic and atraumatic.   Mouth:  pharynx pink and moist.  pharynx pink and moist.   Lungs:  normal breath sounds.  normal breath sounds.   Skin:  slight papular rash on her back    Impression & Recommendations:  Problem # 1:  HIV DISEASE (ICD-042) Will obtain labs today and have pt f/u in 2weeks. Diagnostics Reviewed:  HIV: CDC-defined AIDS (12/28/2009)   CD4: 1.7.10 (12/22/2008)   WBC: 3.3 (02/22/2010)   Hgb: 13.9 (02/22/2010)   HCT: 41.4 (02/22/2010)   Platelets: 209 (02/22/2010) HIV genotype: See Comment (12/28/2009)  HIV-1 RNA: 73 (02/22/2010)   HBSAg: NEGATIVE (01/30/2007)  Problem # 2:  ACUTE BRONCHITIS (ICD-466.0) treat with z-pack  Problem # 3:  SKIN RASH (ICD-782.1)  Her updated medication list for this problem includes:    Triamcinolone Acetonide 0.1 % Crea (Triamcinolone acetonide) .Marland Kitchen... Apply two times a day  Her updated medication list for this problem includes:    Triamcinolone Acetonide 0.1 % Crea (Triamcinolone acetonide) .Marland Kitchen... Apply two times a day  Medications Added to Medication List This Visit: 1)  Metrogel-vaginal 0.75 % Gel (Metronidazole) .... Use as directed 2)  Zithromax Z-pak 250 Mg Tabs  (Azithromycin) .... Take as directed 3)  Triamcinolone Acetonide 0.1 % Crea (Triamcinolone acetonide) .... Apply two times a day  Other Orders: T-CD4SP [facility]) (CD4SP) T-HIV Viral Load [phone]) T-Comprehensive Metabolic Panel [phone]) T-CBC w/Diff [phone]) T-Lipid Profile ([id number]) Est. Patient Level IV (28413) Rocephin  250mg  (K4401) Admin of Therapeutic Inj  intramuscular or subcutaneous (02725) Admin of Therapeutic Inj  intramuscular or subcutaneous (36644)  Patient Instructions: 1)  Please schedule a follow-up appointment in 2 weeks.  Prescriptions: TRIAMCINOLONE ACETONIDE 0.1 % CREA (TRIAMCINOLONE ACETONIDE) apply two times a day  #60gm x 0   Entered and Authorized by:   Yisroel Ramming MD   Signed by:   Yisroel Ramming MD on 05/21/2010   Method used:   Print then Give to Patient   RxID:   [id number] ZITHROMAX Z-PAK 250 MG TABS (AZITHROMYCIN) take as directed  #1 pack x 0   Entered and Authorized by:   Yisroel Ramming MD   Signed by:   Yisroel Ramming MD on 05/21/2010   Method used:   Print then Give to Patient   RxID:   [id number] METROGEL-VAGINAL 0.75 % GEL (METRONIDAZOLE) use as directed  #70gm x 0   Entered and Authorized by:   Yisroel Ramming MD   Signed by:   Yisroel Ramming MD on 05/21/2010   Method used:   Print then Give to Patient   RxID:   [id number]   Prevention & Chronic Care Immunizations   Influenza vaccine: Fluvax Non-MCR  (12/29/2009)    Tetanus booster: Not documented    Pneumococcal vaccine: Pneumovax  (03/12/2007)  Other Screening   Pap smear: ATYPICAL SQUAMOUS CELLS OF UNDETERMINED SIGNIFICANCE  (03/07/2009)    Mammogram: BI-RADS CATEGORY 3:  Probably benign finding(s) - short interval^MM DIGITAL DIAGNOSTIC BILAT  (01/30/2010)   Smoking status: current  (05/21/2010)  Lipids   Total Cholesterol: Not documented   LDL: Not documented   LDL Direct: Not documented   HDL: Not documented   Triglycerides: Not  documented  Hypertension   Last Blood Pressure: 152 / 105  (05/21/2010)   Serum creatinine: 0.65  (02/22/2010)   Serum potassium 4.2  (02/22/2010) CMP ordered   Self-Management Support :    Hypertension self-management support: Not documented    Medication Administration  Injection # 1:    Medication: Rocephin  250mg     Diagnosis: CELLULITIS AND ABSCESS OF UNSPECIFIED SITE (ICD-682.9)    Route: IM    Site: LUOQ gluteus    Exp Date: 10/2012    Lot #: [id number]    Mfr: Sandoz    Comments: Rocephin 1 gram    Patient tolerated injection without complications    Given by: Wendall Mola CMA Duncan Dull) (May 21, 2010 4:25 PM)  Orders Added: 1)  T-CD4SP [facility]) [CD4SP] 2)  T-HIV Viral Load [phone] 3)  T-Comprehensive Metabolic Panel [[id number]] 4)  T-CBC w/Diff [  85025-10010] 5)  T-Lipid Profile [80061-22930] 6)  Est. Patient Level IV [16109] 7)  Rocephin  250mg  [J0696] 8)  Admin of Therapeutic Inj  intramuscular or subcutaneous [96372] 9)  Admin of Therapeutic Inj  intramuscular or subcutaneous [60454]

## 2011-01-15 NOTE — Miscellaneous (Signed)
Summary: Office Visit ([facility])    Vital Signs:  Patient profile:   45 year old female Menstrual status:  irregular Weight:      130 pounds Temp:     98.5 degrees F oral Pulse rate:   89 / minute Pulse rhythm:   regular Resp:     20 per minute BP sitting:   143 / 90  (left arm)  Vitals Entered By: Sharen Heck RN (January 11, 2010 3:44 PM) CC: f/u 05, has been off BP med for months Is Patient Diabetic? No Pain Assessment Patient in pain? no       Does patient need assistance? Functional Status Self care Ambulation Normal   CC:  f/u 05 and has been off BP med for months.  History of Present Illness: Pt here for her lab results. She never got her BP medication. Feels kind of spacey. No HA or CP.  Preventive Screening-Counseling & Management  Alcohol-Tobacco     Alcohol drinks/day: 1     Smoking Status: current     Packs/Day: 1/2  Caffeine-Diet-Exercise     Caffeine use/day: 2     Does Patient Exercise: no  Current Problems (verified): 1)  Preventive Health Care  (ICD-V70.0) 2)  Thrush  (ICD-112.0) 3)  Acute Bronchitis  (ICD-466.0) 4)  Hypertension  (ICD-401.9) 5)  HIV Disease  (ICD-042)  Current Medications (verified): 1)  Bactrim Ds 800-160 Mg  Tabs (Sulfamethoxazole-Trimethoprim) .... Take 1 Tablet By Mouth Once A Day 2)  Toprol Xl 25 Mg  Tb24 (Metoprolol Succinate) .... Take 1 Tablet By Mouth Once A Day 3)  Claritin 10 Mg Tabs (Loratadine) .... Take 1 Tablet By Mouth Once A Day 4)  Truvada 200-300 Mg Tabs (Emtricitabine-Tenofovir) .... Take 1 Tablet By Mouth Once A Day 5)  Isentress 400 Mg Tabs (Raltegravir Potassium) .... Take 1 Tablet By Mouth Two Times A Day  Allergies (verified): No Known Drug Allergies   Review of Systems  The patient denies anorexia, fever, and weight loss.     Physical Exam  General:  alert, well-developed, well-nourished, and well-hydrated.   Head:  normocephalic and atraumatic.     Impression &  Recommendations:  Problem # 1:  HIV DISEASE (ICD-042) genotype shows resistance to Sustiva. Will switch her meds to Truvada and Isentress.  Potential side effects were discussed.  She will f/u in 6 weeks. Her updated medication list for this problem includes:    Bactrim Ds 800-160 Mg Tabs (Sulfamethoxazole-trimethoprim) .Marland Kitchen... Take 1 tablet by mouth once a day  Diagnostics Reviewed:  HIV: CDC-defined AIDS (12/28/2009)   CD4: 1.7.10 (12/22/2008)   WBC: 3.1 (12/28/2009)   Hgb: 14.7 (12/28/2009)   HCT: 43.2 (12/28/2009)   Platelets: 161 (12/28/2009) HIV genotype: See Comment (12/28/2009)   HIV-1 RNA: 126000 (12/28/2009)   HBSAg: NEGATIVE (01/30/2007)  Problem # 2:  HYPERTENSION (ICD-401.9) Pt to pick up her Toprol at the pharmacy. Her updated medication list for this problem includes:    Toprol Xl 25 Mg Tb24 (Metoprolol succinate) .Marland Kitchen... Take 1 tablet by mouth once a day  Medications Added to Medication List This Visit: 1)  Truvada 200-300 Mg Tabs (Emtricitabine-tenofovir) .... Take 1 tablet by mouth once a day 2)  Isentress 400 Mg Tabs (Raltegravir potassium) .... Take 1 tablet by mouth two times a day   Patient Instructions: 1)  Please schedule a follow-up appointment in 6 weeks. Prescriptions: ISENTRESS 400 MG TABS (RALTEGRAVIR POTASSIUM) Take 1 tablet by mouth two times a day  #  60 x 5   Entered and Authorized by:   Yisroel Ramming MD   Signed by:   Yisroel Ramming MD on 01/11/2010   Method used:   Print then Give to Patient   RxID:   [id number] TRUVADA 200-300 MG TABS (EMTRICITABINE-TENOFOVIR) Take 1 tablet by mouth once a day  #30 x 5   Entered and Authorized by:   Yisroel Ramming MD   Signed by:   Yisroel Ramming MD on 01/11/2010   Method used:   Print then Give to Patient   RxID:   [id number]

## 2011-01-17 NOTE — Assessment & Plan Note (Signed)
Summary: VAGINAL INFECTION?/VS   CC:  pt. c/o vaginal itching and discharge, bumps on scalp, B/P elevated has not been taking Toprol regularly, and needs refill.  History of Present Illness: patient complains of a white vaginal discharge that is somewhat itchy.  She denies and odor to her discharge.  She has had this complaint for several days now.  She has been out of her blood pressure medication because she is no longer able to get it at [facility].  She denies headache or chest pain.  She also complains of sores on her head since a perm that she had.  The sores are draining pus-like fluid.  She states that she has been taking her HIV medications every day.  Preventive Screening-Counseling & Management  Alcohol-Tobacco     Alcohol drinks/day: socially     Smoking Status: current     Smoke Cessation Stage: precontemplative     Packs/Day: 1/2  Caffeine-Diet-Exercise     Caffeine use/day: coffee 2 per day     Does Patient Exercise: yes     Type of exercise: gym     Times/week: <3  Hep-HIV-STD-Contraception     HIV Risk: no     Sun Exposure-Excessive: occasionally  Safety-Violence-Falls     Seat Belt Use: 100      Sexual History:  currently monogamous.        Drug Use:  never and no.    Comments: pt. given condoms   Updated Prior Medication List: CLARITIN 10 MG TABS (LORATADINE) Take 1 tablet by mouth once a day TRUVADA 200-300 MG TABS (EMTRICITABINE-TENOFOVIR) Take 1 tablet by mouth once a day ISENTRESS 400 MG TABS (RALTEGRAVIR POTASSIUM) Take 1 tablet by mouth two times a day METROGEL-VAGINAL 0.75 % GEL (METRONIDAZOLE) use as directed ATENOLOL 100 MG TABS (ATENOLOL) Take 1 tablet by mouth once a day DOXYCYCLINE HYCLATE 100 MG CAPS (DOXYCYCLINE HYCLATE) Take 1 capsule by mouth two times a day  Current Allergies (reviewed today): No known allergies  Past History:  Past Medical History: Last updated: 08/06/2007 HIV disease Hypertension  Review of Systems  The  patient denies anorexia, fever, weight loss, chest pain, and headaches.    Vital Signs:  Patient profile:   45 year old female Menstrual status:  irregular Height:      64 inches (162.56 cm) Weight:      168.8 pounds (76.73 kg) BMI:     29.08 Temp:     98.0 degrees F (36.67 degrees C) oral Pulse rate:   85 / minute BP sitting:   160 / 119  (left arm)  Vitals Entered By: Wendall Mola CMA Duncan Dull) (December 26, 2010 9:21 AM) CC: pt. c/o vaginal itching and discharge, bumps on scalp, B/P elevated has not been taking Toprol regularly, needs refill Is Patient Diabetic? No Pain Assessment Patient in pain? no      Nutritional Status BMI of 25 - 29 = overweight Nutritional Status Detail appetite "great"  Have you ever been in a relationship where you felt threatened, hurt or afraid?No   Does patient need assistance? Functional Status Self care Ambulation Normal Comments pt. has missed three doses of HAART meds since last visit   Physical Exam  General:  alert, well-developed, well-nourished, and well-hydrated.   Head:  normocephalic and atraumatic.  some pustules on scalp Lungs:  normal breath sounds.   Heart:  normal rate and regular rhythm.     Impression & Recommendations:  Problem # 1:  HIV DISEASE (ICD-042) Will  obatin labs today and have patient return in 2 weeks for results. Her updated medication list for this problem includes:    Doxycycline Hyclate 100 Mg Caps (Doxycycline hyclate) .Marland Kitchen... Take 1 capsule by mouth two times a day  Orders: T-CBC w/Diff [id number]) T-CD4SP [facility]) (CD4SP) T-Comprehensive Metabolic Panel [id number]) T-HIV Viral Load [phone]) Est. Patient Level IV (41324)  Diagnostics Reviewed:  HIV: CDC-defined AIDS (12/28/2009)   CD4: 220 (08/30/2010)   WBC: 2.6 (08/29/2010)   Hgb: 14.7 (08/29/2010)   HCT: 43.2 (08/29/2010)   Platelets: 144 (08/29/2010) HIV genotype: See Comment (12/28/2009)   HIV-1 RNA: <20 copies/mL (08/29/2010)    HBSAg: NEGATIVE (01/30/2007)  Problem # 2:  VAGINAL DISCHARGE (ICD-623.5) obtain wet prep Orders: T-Wet Prep by Molecular Probe ([id number]) Est. Patient Level IV (64403)  Problem # 3:  CELLULITIS AND ABSCESS OF UNSPECIFIED SITE (ICD-682.9) boils on scalp will treat with doxycycline for 10 days Her updated medication list for this problem includes:    Doxycycline Hyclate 100 Mg Caps (Doxycycline hyclate) .Marland Kitchen... Take 1 capsule by mouth two times a day  Problem # 4:  HYPERTENSION (ICD-401.9) will switch to atenolo which she can get on the $4 Rx plan. The following medications were removed from the medication list:    Toprol Xl 25 Mg Tb24 (Metoprolol succinate) .Marland Kitchen... Take 1 tablet by mouth once a day Her updated medication list for this problem includes:    Atenolol 100 Mg Tabs (Atenolol) .Marland Kitchen... Take 1 tablet by mouth once a day  Medications Added to Medication List This Visit: 1)  Atenolol 100 Mg Tabs (Atenolol) .... Take 1 tablet by mouth once a day 2)  Doxycycline Hyclate 100 Mg Caps (Doxycycline hyclate) .... Take 1 capsule by mouth two times a day  Patient Instructions: 1)  Please schedule a follow-up appointment in 2 weeks. Prescriptions: DOXYCYCLINE HYCLATE 100 MG CAPS (DOXYCYCLINE HYCLATE) Take 1 capsule by mouth two times a day  #20 x 0   Entered and Authorized by:   Yisroel Ramming MD   Signed by:   Yisroel Ramming MD on 12/26/2010   Method used:   Print then Give to Patient   RxID:   [id number] ATENOLOL 100 MG TABS (ATENOLOL) Take 1 tablet by mouth once a day  #30 x 5   Entered and Authorized by:   Yisroel Ramming MD   Signed by:   Yisroel Ramming MD on 12/26/2010   Method used:   Print then Give to Patient   RxID:   [id number]

## 2011-01-18 ENCOUNTER — Inpatient Hospital Stay (HOSPITAL_COMMUNITY)
Admission: EM | Admit: 2011-01-18 | Discharge: 2011-01-22 | DRG: 063 | Disposition: A | Discharge status: Home / Self Care | Payer: Medicaid Other | Attending: Neurology | Admitting: Neurology

## 2011-01-18 ENCOUNTER — Inpatient Hospital Stay (HOSPITAL_COMMUNITY): Payer: Medicaid Other

## 2011-01-18 ENCOUNTER — Emergency Department (HOSPITAL_COMMUNITY): Payer: Medicaid Other

## 2011-01-18 DIAGNOSIS — Z21 Asymptomatic human immunodeficiency virus [HIV] infection status: Secondary | ICD-10-CM | POA: Diagnosis present

## 2011-01-18 DIAGNOSIS — I10 Essential (primary) hypertension: Secondary | ICD-10-CM | POA: Diagnosis present

## 2011-01-18 DIAGNOSIS — F17200 Nicotine dependence, unspecified, uncomplicated: Secondary | ICD-10-CM | POA: Diagnosis present

## 2011-01-18 DIAGNOSIS — Z7982 Long term (current) use of aspirin: Secondary | ICD-10-CM

## 2011-01-18 DIAGNOSIS — E785 Hyperlipidemia, unspecified: Secondary | ICD-10-CM | POA: Diagnosis present

## 2011-01-18 DIAGNOSIS — T45615A Adverse effect of thrombolytic drugs, initial encounter: Secondary | ICD-10-CM | POA: Diagnosis not present

## 2011-01-18 DIAGNOSIS — T783XXA Angioneurotic edema, initial encounter: Secondary | ICD-10-CM | POA: Diagnosis not present

## 2011-01-18 DIAGNOSIS — I6340 Cerebral infarction due to embolism of unspecified cerebral artery: Principal | ICD-10-CM | POA: Diagnosis present

## 2011-01-18 LAB — PROTIME-INR
INR: 0.92 (ref 0.00–1.49)
Prothrombin Time: 12.6 seconds (ref 11.6–15.2)
Prothrombin Time: 13.3 seconds (ref 11.6–15.2)

## 2011-01-18 LAB — CK TOTAL AND CKMB (NOT AT ARMC)
Relative Index: 1.7 (ref 0.0–2.5)
Total CK: 463 U/L — ABNORMAL HIGH (ref 7–177)
Total CK: 310 U/L — ABNORMAL HIGH (ref 7–177)

## 2011-01-18 LAB — DIFFERENTIAL
Basophils Absolute: 0 10*3/uL (ref 0.0–0.1)
Basophils Relative: 0 % (ref 0–1)
Eosinophils Absolute: 0.2 10*3/uL (ref 0.0–0.7)
Eosinophils Relative: 7 % — ABNORMAL HIGH (ref 0–5)
Lymphocytes Relative: 48 % — ABNORMAL HIGH (ref 12–46)
Lymphocytes Relative: 19 % (ref 12–46)
Lymphs Abs: 1.3 10*3/uL (ref 0.7–4.0)
Lymphs Abs: 0.6 10*3/uL — ABNORMAL LOW (ref 0.7–4.0)
Monocytes Absolute: 0.4 10*3/uL (ref 0.1–1.0)
Monocytes Absolute: 0.1 10*3/uL (ref 0.1–1.0)
Monocytes Relative: 13 % — ABNORMAL HIGH (ref 3–12)
Monocytes Relative: 2 % — ABNORMAL LOW (ref 3–12)
Neutro Abs: 0.9 10*3/uL — ABNORMAL LOW (ref 1.7–7.7)
Neutro Abs: 2.5 10*3/uL (ref 1.7–7.7)
Neutrophils Relative %: 32 % — ABNORMAL LOW (ref 43–77)
Neutrophils Relative %: 78 % — ABNORMAL HIGH (ref 43–77)

## 2011-01-18 LAB — CBC
HCT: 43.1 % (ref 36.0–46.0)
HCT: 44.2 % (ref 36.0–46.0)
Hemoglobin: 14.8 g/dL (ref 12.0–15.0)
Hemoglobin: 15.2 g/dL — ABNORMAL HIGH (ref 12.0–15.0)
MCH: 30.5 pg (ref 26.0–34.0)
MCH: 30.6 pg (ref 26.0–34.0)
MCHC: 34.3 g/dL (ref 30.0–36.0)
MCHC: 34.4 g/dL (ref 30.0–36.0)
MCV: 88.7 fL (ref 78.0–100.0)
MCV: 89.1 fL (ref 78.0–100.0)
Platelets: 134 10*3/uL — ABNORMAL LOW (ref 150–400)
RBC: 4.86 MIL/uL (ref 3.87–5.11)
RBC: 4.96 MIL/uL (ref 3.87–5.11)
RDW: 12.9 % (ref 11.5–15.5)
WBC: 2.8 10*3/uL — ABNORMAL LOW (ref 4.0–10.5)

## 2011-01-18 LAB — COMPREHENSIVE METABOLIC PANEL
ALT: 21 U/L (ref 0–35)
ALT: 22 U/L (ref 0–35)
AST: 26 U/L (ref 0–37)
Alkaline Phosphatase: 95 U/L (ref 39–117)
CO2: 23 mEq/L (ref 19–32)
CO2: 22 mEq/L (ref 19–32)
Calcium: 8.5 mg/dL (ref 8.4–10.5)
Calcium: 8.5 mg/dL (ref 8.4–10.5)
Chloride: 106 mEq/L (ref 96–112)
Creatinine, Ser: 0.82 mg/dL (ref 0.4–1.2)
GFR calc Af Amer: >60 mL/min (ref >60)
GFR calc non Af Amer: >60 mL/min (ref >60)
GFR calc non Af Amer: >60 mL/min (ref >60)
Glucose, Bld: 103 mg/dL — ABNORMAL HIGH (ref 70–99)
Glucose, Bld: 102 mg/dL — ABNORMAL HIGH (ref 70–99)
Potassium: 3.7 mEq/L (ref 3.5–5.1)
Sodium: 137 mEq/L (ref 135–145)
Total Bilirubin: 2.4 mg/dL — ABNORMAL HIGH (ref 0.3–1.2)

## 2011-01-18 LAB — URINALYSIS, ROUTINE W REFLEX MICROSCOPIC
Bilirubin Urine: NEGATIVE
Ketones, ur: NEGATIVE mg/dL
Nitrite: NEGATIVE
Urobilinogen, UA: 0.2 mg/dL (ref 0.0–1.0)
pH: 6.5 (ref 5.0–8.0)

## 2011-01-18 LAB — RAPID URINE DRUG SCREEN, HOSP PERFORMED
Amphetamines: NOT DETECTED
Cocaine: NOT DETECTED
Tetrahydrocannabinol: NOT DETECTED

## 2011-01-18 LAB — TROPONIN I
Troponin I: 0.11 ng/mL — ABNORMAL HIGH (ref 0.00–0.06)
Troponin I: 0.14 ng/mL — ABNORMAL HIGH (ref 0.00–0.06)

## 2011-01-18 LAB — GLUCOSE, CAPILLARY: Glucose-Capillary: 102 mg/dL — ABNORMAL HIGH (ref 70–99)

## 2011-01-18 LAB — POCT I-STAT, CHEM 8
BUN: 9 mg/dL (ref 6–23)
Calcium, Ion: 1 mmol/L — ABNORMAL LOW (ref 1.12–1.32)
Creatinine, Ser: 1 mg/dL (ref 0.4–1.2)
TCO2: 27 mmol/L (ref 0–100)

## 2011-01-18 LAB — APTT: aPTT: 29 seconds (ref 24–37)

## 2011-01-18 LAB — URINE MICROSCOPIC-ADD ON

## 2011-01-19 ENCOUNTER — Inpatient Hospital Stay (HOSPITAL_COMMUNITY): Payer: Medicaid Other

## 2011-01-19 LAB — LIPID PANEL
LDL Cholesterol: 112 mg/dL — ABNORMAL HIGH (ref 0–99)
Total CHOL/HDL Ratio: 3.1 RATIO
Triglycerides: 48 mg/dL (ref <150)
VLDL: 10 mg/dL (ref 0–40)

## 2011-01-19 LAB — HEMOGLOBIN A1C
Hgb A1c MFr Bld: 5.1 % (ref <5.7)
Mean Plasma Glucose: 100 mg/dL (ref <117)

## 2011-01-20 DIAGNOSIS — I6789 Other cerebrovascular disease: Secondary | ICD-10-CM

## 2011-01-20 LAB — GLUCOSE, CAPILLARY
Glucose-Capillary: 100 mg/dL — ABNORMAL HIGH (ref 70–99)
Glucose-Capillary: 131 mg/dL — ABNORMAL HIGH (ref 70–99)

## 2011-01-21 ENCOUNTER — Inpatient Hospital Stay (HOSPITAL_COMMUNITY): Payer: Medicaid Other

## 2011-01-21 DIAGNOSIS — R079 Chest pain, unspecified: Secondary | ICD-10-CM

## 2011-01-21 DIAGNOSIS — I6350 Cerebral infarction due to unspecified occlusion or stenosis of unspecified cerebral artery: Secondary | ICD-10-CM

## 2011-01-21 DIAGNOSIS — I6789 Other cerebrovascular disease: Secondary | ICD-10-CM

## 2011-01-21 LAB — GLUCOSE, CAPILLARY: Glucose-Capillary: 94 mg/dL (ref 70–99)

## 2011-01-22 ENCOUNTER — Inpatient Hospital Stay (HOSPITAL_COMMUNITY): Payer: Medicaid Other

## 2011-01-22 DIAGNOSIS — R072 Precordial pain: Secondary | ICD-10-CM

## 2011-01-22 LAB — GLUCOSE, CAPILLARY: Glucose-Capillary: 110 mg/dL — ABNORMAL HIGH (ref 70–99)

## 2011-01-22 LAB — ANA: Anti Nuclear Antibody(ANA): NEGATIVE

## 2011-01-23 NOTE — Miscellaneous (Signed)
  Clinical Lists Changes  Observations: Added new observation of INCOMESOURCE: UNKNOWN (01/14/2011 15:21) Added new observation of #CHILD<18 IN: Unknown (01/14/2011 15:21) Added new observation of YEARLYEXPEN: 0  (01/14/2011 15:21) Added new observation of GENDER: Unknown  (01/14/2011 15:21)

## 2011-01-24 ENCOUNTER — Emergency Department (HOSPITAL_COMMUNITY): Payer: Medicaid Other

## 2011-01-24 ENCOUNTER — Emergency Department (HOSPITAL_COMMUNITY)
Admission: EM | Admit: 2011-01-24 | Discharge: 2011-01-25 | Disposition: A | Discharge status: Home / Self Care | Payer: Medicaid Other | Attending: Emergency Medicine | Admitting: Emergency Medicine

## 2011-01-24 DIAGNOSIS — I10 Essential (primary) hypertension: Secondary | ICD-10-CM | POA: Insufficient documentation

## 2011-01-24 DIAGNOSIS — I69998 Other sequelae following unspecified cerebrovascular disease: Secondary | ICD-10-CM | POA: Insufficient documentation

## 2011-01-24 DIAGNOSIS — R51 Headache: Secondary | ICD-10-CM | POA: Insufficient documentation

## 2011-01-24 DIAGNOSIS — B20 Human immunodeficiency virus [HIV] disease: Secondary | ICD-10-CM | POA: Insufficient documentation

## 2011-01-24 DIAGNOSIS — I69992 Facial weakness following unspecified cerebrovascular disease: Secondary | ICD-10-CM | POA: Insufficient documentation

## 2011-01-24 DIAGNOSIS — F17200 Nicotine dependence, unspecified, uncomplicated: Secondary | ICD-10-CM | POA: Insufficient documentation

## 2011-01-24 DIAGNOSIS — R29898 Other symptoms and signs involving the musculoskeletal system: Secondary | ICD-10-CM | POA: Insufficient documentation

## 2011-01-24 LAB — CBC
MCH: 30.7 pg (ref 26.0–34.0)
MCHC: 34.5 g/dL (ref 30.0–36.0)
Platelets: 137 10*3/uL — ABNORMAL LOW (ref 150–400)
RBC: 4.73 MIL/uL (ref 3.87–5.11)
RDW: 12.4 % (ref 11.5–15.5)

## 2011-01-24 LAB — DIFFERENTIAL
Basophils Relative: 0 % (ref 0–1)
Eosinophils Absolute: 0.2 10*3/uL (ref 0.0–0.7)
Monocytes Relative: 10 % (ref 3–12)
Neutrophils Relative %: 45 % (ref 43–77)

## 2011-01-24 LAB — BASIC METABOLIC PANEL
BUN: 9 mg/dL (ref 6–23)
Chloride: 103 mEq/L (ref 96–112)
Creatinine, Ser: 0.62 mg/dL (ref 0.4–1.2)
GFR calc non Af Amer: >60 mL/min (ref >60)

## 2011-01-25 ENCOUNTER — Telehealth: Payer: Self-pay | Admitting: Adult Health

## 2011-01-25 ENCOUNTER — Ambulatory Visit: Payer: Self-pay | Admitting: Adult Health

## 2011-01-25 ENCOUNTER — Encounter: Payer: Self-pay | Admitting: Adult Health

## 2011-01-25 DIAGNOSIS — I69959 Hemiplegia and hemiparesis following unspecified cerebrovascular disease affecting unspecified side: Secondary | ICD-10-CM | POA: Insufficient documentation

## 2011-01-28 ENCOUNTER — Ambulatory Visit: Payer: 59 | Admitting: Adult Health

## 2011-02-01 ENCOUNTER — Encounter (INDEPENDENT_AMBULATORY_CARE_PROVIDER_SITE_OTHER): Payer: Self-pay | Admitting: *Deleted

## 2011-02-06 ENCOUNTER — Telehealth (INDEPENDENT_AMBULATORY_CARE_PROVIDER_SITE_OTHER): Payer: Self-pay | Admitting: *Deleted

## 2011-02-06 NOTE — Progress Notes (Signed)
Summary: Recent stroke and ED f/u 01/24/11 for B/P elevation  Phone Note Call from Patient Call back at Home Phone [phone]   Caller: Patient Reason for Call: Acute Illness Action Taken: Phone Call Completed, Appt Scheduled Today Summary of Call: Pt. recently diagnosed with stroke.  Returned to ED yesterday d/t elevated blood pressure.  Placed on new medications.  Pt. instructed to call this office for urgent follow up.  ED wanting new B/P meds reconciled with HIV mreds. Pt. scheduled for 3:45 today.  Jennet Maduro RN  January 25, 2011 10:19 AM

## 2011-02-06 NOTE — Miscellaneous (Signed)
  Clinical Lists Changes  Observations: Added new observation of TRANSGENDER: Female to female (02/01/2011 15:26) Added new observation of GENDER: Transgender (02/01/2011 15:26)

## 2011-02-12 NOTE — Progress Notes (Signed)
Summary: Med pick-up  Phone Note Outgoing Call   Call placed by: Alesia Morin CMA,  February 06, 2011 9:08 AM Action Taken: Phone Call Completed Details for Reason: Med pick-up Summary of Call: Called the patient to let her know that her medications are here and need to be picked up ASAP. We have her Truvada and her Isentress

## 2011-02-14 NOTE — Discharge Summary (Signed)
[name], [name]                ACCOUNT NO.:  [id number]  MEDICAL RECORD NO.:  [id number]           PATIENT TYPE:  I  LOCATION:  3003                         FACILITY:  [facility]  PHYSICIAN:  Read Bonelli P. [name], MD    DATE OF BIRTH:  1966/05/09  DATE OF ADMISSION:  01/18/2011 DATE OF DISCHARGE:  01/22/2011                              DISCHARGE SUMMARY   DIAGNOSES AT TIME OF DISCHARGE: 1. Right middle cerebral artery branch infarct, felt to be     embolic, though no source found during hospitalization. 2. Hypertension. 3. Human immunodeficiency virus. 4. Dyslipidemia. 5. Cigarette smoker. 6. Alcohol use.  MEDICINES AT TIME OF DISCHARGE: 1. Aspirin 325 mg a day. 2. Simvastatin 20 mg a day. 3. Atenolol 100 mg a day. 4. Isentress 400 mg b.i.d. 5. Truvada 200/300 mg a day.  STUDIES PERFORMED: 1. CT of the brain shows no acute abnormality.  A mildly age advanced     cerebral atrophy.  Minimal sinus disease. 2. MRI of the brain shows 3-4 cm acute infarct in the right     frontoparietal region.  No apparent hemorrhage. 3. MRA of the head shows no major vessel occlusion or correctable     proximal stenosis.  Minimal medium-to-small vessel atherosclerotic     changes. 4. Chest x-ray shows no pneumothorax postbronchoscopy. 5. 2D echocardiogram shows EF of 55-60% with no obvious source of     embolus. 6. 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  No evidence of thrombus in atrial cavity or appendage     right or left, delayed appearance of agitated saline contrast in     left atrium suggestive of possible transpulmonary passage or shunt.     No PFO by color Doppler or with contrast injections.  Carotid     Doppler shows no ICA stenosis. 7. EKG shows sinus bradycardia with sinus arrhythmia, left axis     deviation, minimal voltage criteria for LVH maybe normal  variant.  LABORATORY STUDIES:  ANA negative.  Cardiac enzymes with CK-MB 5.6, relative index 4.7, CK 120 and troponin 0.03.  Sed rate 5.  Pathology smear review shows moderate absolute neutropenia and mild thrombocytopenia, absolute lymphocyte count normal, but increased reactive forms present.  Cholesterol 179, triglycerides 48, HDL 57, LDL 112, hemoglobin A1c 5.1.  Coagulation studies normal.  CBC with hemoglobin 13.2, white blood cells 3.2, platelets 132, neutrophils 78. MRSA screening negative.  Urinalysis was 3-6 white blood cells, 0-2 red blood cells, and few bacteria.  Urine drug screen is negative.  Urine pregnancy is negative.  HISTORY OF PRESENT ILLNESS:  Abigail Medina is a 45 year old right- handed African American female who was last seen normal at 2:00 p.m. the day of admission.  She was in her normal state of health playing with her dog at 2:00 p.m. when she bent over to pick up something.  She felt sudden onset of left arm numbness and tingling with left-sided facial weakness and numbness.  The patient called a friend  to bring her to the emergency room.  Upon arrival, CT was negative for acute abnormality. NIH stroke scale was 5.  She was thought to be a candidate for full-dose IV t-PA, which was administered at 3:55 p.m.  She was admitted to the hospital for further stroke evaluation.  HOSPITAL COURSE:  The patient tolerated t-PA without difficulty.  Post t- PA imaging showed no acute hemorrhage.  She was started on aspirin for secondary stroke prevention. The patient was also found to have mild dyslipidemia and started on Zocor.  It is felt her stroke was likely embolic; therefore, a transesophageal echocardiogram was performed.  TEE was negative for obvious source, though she did have delayed agitation of saline contrast, which indicates possible transpulmonary AV shunt.  She had no PFO.  Following the TEE, the patient had chest pain.  Cardiac enzymes were  nonreactive.  No further cardiac testing was needed. Outpatient TCD study was recommended if she has a large shunt and a chest CTA to evaluate for pulmonary AVMs.  Cardiology deferred to Dr. [name] and follow up to make these decisions.  In the meanwhile, the patient was evaluated by PT, OT, and Speech Therapy and recommended outpatient followup.  CONDITION AT TIME OF DISCHARGE:  The patient alert and oriented x3. Mild left facial weakness, mild left upper extremity weakness, decreased grip on the left, and decreased fine motor movements on the left.  She has good lower extremity strength and slightly ataxic gait.  DISCHARGE PLAN: 1. Discharge home with family. 2. Aspirin for secondary stroke prevention. 3. New Zocor for mild dyslipidemia.  Needs followup by primary care     physician. 4. Consider outpatient IRIS study. 5. Outpatient CardioNet monitoring arranged with [facility].     They are to call the patient and setup. 6. Outpatient TCD bubble study     and emboli monitoring within 1 month with Dr. [name]. 6. Outpatient PT, OT, and speech therapy. 7. TCD/chest CT to evaluate for pulmonary AVMs per Neurology     discretion.     [name], N.P.   ______________________________ Sunny Schlein. [name], MD    SB/MEDQ  D:  01/22/2011  T:  01/23/2011  Job:  [id number]  cc:    Cardiology Dr. [facility]  Electronically Signed by [name] N.P. on 02/04/2011 04:04:46 PM Electronically Signed by Delia Heady MD on 02/14/2011 01:15:49 PM

## 2011-02-15 NOTE — Consult Note (Signed)
Abigail Medina, Abigail Medina NO.:  [id number]  MEDICAL RECORD NO.:  [id number]           PATIENT TYPE:  I  LOCATION:  3003                         FACILITY:  [facility]  PHYSICIAN:  Brayton El, MD    DATE OF BIRTH:  February 11, 1966  DATE OF CONSULTATION:  01/21/2011 DATE OF DISCHARGE:                                CONSULTATION   CHIEF COMPLAINT:  Intermittent chest pain after TEE.  HISTORY OF PRESENT ILLNESS:  The patient is a 45 year old black female with past medical history significant for HIV and hypertension who was admitted with an acute CVA and given thrombolytics.  As part of the workup for CVA, she underwent a TEE earlier today which demonstrated a structurally normal heart with no evidence of PFO.  The patient states that since the procedure, she has had persistent discomfort in her throat and chest.  She has vomited 2 times but denies any frank hematemesis or coffee-ground emesis.  She has recently been able to tolerate very small amounts of p.o. liquid without any worsening of the chest or throat discomfort.  The patient denies any prior history of swallowing difficulty or chest or throat pain.  PAST MEDICAL HISTORY:  As above in HPI.  SOCIAL HISTORY:  She smokes cigarettes and drinks moderate amount of alcohol.  No illicit drug use.  FAMILY HISTORY:  None.  ALLERGIES:  No known drug allergies.  MEDICATIONS: 1. Aspirin 325 daily. 2. Lopressor 25 b.i.d. 3. Reglan p.r.n. 4. Zocor 20 mg every evening. 5. Labetalol p.r.n. 6. Nicardipine p.r.n.  REVIEW OF SYSTEMS:  As in HPI.  The patient also complained of left facial and left arm numbness and weakness as well as slurred speech from her stroke.  All other systems as in HPI, otherwise negative.  PHYSICAL EXAMINATION:  VITAL SIGNS:  Blood pressure is 160/100, heart rate is 60. GENERAL:  No acute distress. HEENT:  Normocephalic and atraumatic.  Oropharynx appears clear without lesion or  exudate. NECK:  Supple.  There is no tenderness to palpation on the anterior portion of her trachea. HEART:  Regular rate and rhythm. LUNGS:  Clear bilaterally. ABDOMEN:  Soft, nontender, and nondistended. CHEST:  Chest wall is nontender to palpation. ABDOMEN:  Soft, nontender, and nondistended. EXTREMITIES:  Without edema. SKIN:  Warm and dry. PSYCHIATRIC:  The patient is appropriate.  IMAGING:  Chest x-ray postop shows no acute disease.  ASSESSMENT:  Esophageal/oropharyngeal discomfort after TEE.  This could certainly be the normal course and may resolve.  However, micro perforation is also possible.  Homero Fellers perforation is possible but less likely.  PLAN:  I would observe for overnight with adequate pain and nausea control.  Only a small amount p.o. fluid should be given.  If pain persists in a.m., a GI consult may be appropriate for further investigation.  She would need imaging in the form of esophagram versus EGD versus CT with oral contrast to more thoroughly evaluate her esophagus.     Brayton El, MD     SGA/MEDQ  D:  01/21/2011  T:  01/21/2011  Job:  [id number]  Electronically Signed by  Raynelle Bring MD on 02/15/2011 10:35:02 AM

## 2011-02-18 NOTE — H&P (Signed)
Abigail Medina, Abigail Medina                ACCOUNT NO.:  [id number]  MEDICAL RECORD NO.:  [id number]           PATIENT TYPE:  I  LOCATION:  1824                         FACILITY:  [facility]  PHYSICIAN:  Joycelyn Schmid, MD   DATE OF BIRTH:  1966/01/19  DATE OF ADMISSION:  01/18/2011 DATE OF DISCHARGE:                             HISTORY & PHYSICAL   TIME:  4:15 p.m.  CHIEF COMPLAINT:  Left face and arm numbness and weakness, slurred speech, code stroke evaluation.  Code stroke log last seen normal 1400.  The patient arrival to triage 1443.  Code Stroke was activated at 14:58 stroke Team arrival 15:05. Initial ED physician examination 15:20.  Initial NIH stroke scale was 5. Initial neurology examination was 15:45, full dose IV t-PA was given at 15:55.  NIH stroke scale meter prior to giving t-PA was 6.  HISTORY OF PRESENT ILLNESS:  A 45 year old female with history of hypertension and HIV, was in normal state of health at home playing withher dog at 2 p.m. when she bent over to pick something up.  She felt a sudden onset of left arm numbness and tingling, left-sided facial numbness and weakness.  The patient called a friend to bring her to emergency room, but son brought the patient to the Triage.  PAST MEDICAL HISTORY:  Hypertension and HIV.  MEDICATIONS:  Toprol dose unknown, Truvada, and Isentress.  ALLERGIES:  No known drug allergies.  FAMILY HISTORY:  None.  SOCIAL HISTORY:  The patient smokes one pack of cigarettes per day.  She has 2 alcoholic drinks per day.  No illicit drug use.  REVIEW OF SYSTEMS:  As per the HPI plus denies chest pain, shortness of breath, nausea, vomiting, abdominal pain.  Denies any history of intracerebral hemorrhage.  RECENT SURGERIES OR PROCEDURES:  GI bleeding and urinary bleeding.  PHYSICAL EXAMINATION:  VITAL SIGNS:  Blood pressure 184/115.  Repeat blood pressure 170s/110, heart rate 65, respirations 12. GENERAL EXAMINATION:  She is awake and  alert.  Her language is fluent. Comprehension intact.  No aphasia. CRANIAL NERVE EXAMINATION:  Pupils reactive 2 to 1 mm.  Visual fields, full to confrontation.  Extraocular muscles intact.  Decreased facial sensation on the left side.  She has decreased strength in the left lower face.  She has mild dysarthria.  Uvula is midline.  Shoulder symmetric.  Tongue is midline.  Motor examination, normal bulk and tone in the right upper and bilateral lower extremities.  The left upper extremity demonstrates mild drift.  She has significant weakness of left grip strength.  Sensory examination, decreased light touch in the left arm.  No extinction.  Coordination testing, finger-nose-finger on the right upper extremity is normal and on the left upper extremity is mild ataxia.  Reflexes are 1+ throughout. CARDIOVASCULAR EXAMINATION:  Regular rate and rhythm.  No murmurs.  No carotid bruits.  LABORATORY DATA:  Sodium 138, BUN 9, creatinine 1.0, glucose 83.  White count 2.8, platelets 134.  PT 12.6, INR 0.92.  CD4 count on December 26, 2010, is 180.  CT scan of the head, which I reviewed shows no evidence of  intracerebral hemorrhage.  Small streak artifact noted in the right parietal region.  EKG shows normal sinus rhythm.  ASSESSMENT/PLAN:  A 45 year old female with hypertension, human immunodeficiency virus (HIV), now with suspected right subcortical ischemic infarction, NIH stroke scale of 6.  Risks and benefits IV t-PA were discussed with the patient.  We proceeded with full-dose of IV t- PA.  PLAN: 1. Strict blood pressure control at next admission to the Neuro ICU     3100. 2. Transthoracic echocardiogram carotid ultrasound. 3. MRI and MRA of the head. 4. Fasting lipid profile. 5. Antiplatelet agent will be started after repeat neuro imaging study     tomorrow. 6. PT and OT evaluation. 7. NPO until swallow evaluation. 8. Head of the bed less than 30 degrees. 9. Maintain euvolemia,  euglycemia, normothermia.  Reviewed the patient's lab testing, neuroimaging studies myself. Discussed with other healthcare providers evaluation of this high-risk medical condition of acute ischemic infarction requiring high complex medical decision-making.     Joycelyn Schmid, MD     VP/MEDQ  D:  01/18/2011  T:  01/18/2011  Job:  [id number]  Electronically Signed by Joycelyn Schmid  on 02/18/2011 12:28:09 PM

## 2011-02-19 ENCOUNTER — Ambulatory Visit: Payer: Medicaid Other

## 2011-02-19 ENCOUNTER — Ambulatory Visit: Payer: Medicaid Other | Admitting: Physical Therapy

## 2011-02-19 ENCOUNTER — Ambulatory Visit: Payer: Medicaid Other | Attending: Neurology | Admitting: Occupational Therapy

## 2011-02-19 DIAGNOSIS — R269 Unspecified abnormalities of gait and mobility: Secondary | ICD-10-CM | POA: Insufficient documentation

## 2011-02-19 DIAGNOSIS — Z5189 Encounter for other specified aftercare: Secondary | ICD-10-CM | POA: Insufficient documentation

## 2011-02-19 DIAGNOSIS — R279 Unspecified lack of coordination: Secondary | ICD-10-CM | POA: Insufficient documentation

## 2011-02-19 DIAGNOSIS — R5381 Other malaise: Secondary | ICD-10-CM | POA: Insufficient documentation

## 2011-02-19 DIAGNOSIS — I69922 Dysarthria following unspecified cerebrovascular disease: Secondary | ICD-10-CM | POA: Insufficient documentation

## 2011-02-19 DIAGNOSIS — I69991 Dysphagia following unspecified cerebrovascular disease: Secondary | ICD-10-CM | POA: Insufficient documentation

## 2011-02-19 DIAGNOSIS — M6281 Muscle weakness (generalized): Secondary | ICD-10-CM | POA: Insufficient documentation

## 2011-02-19 DIAGNOSIS — R1310 Dysphagia, unspecified: Secondary | ICD-10-CM | POA: Insufficient documentation

## 2011-02-19 DIAGNOSIS — I69998 Other sequelae following unspecified cerebrovascular disease: Secondary | ICD-10-CM | POA: Insufficient documentation

## 2011-02-21 ENCOUNTER — Other Ambulatory Visit: Payer: Self-pay | Admitting: Adult Health

## 2011-02-21 ENCOUNTER — Encounter: Payer: Self-pay | Admitting: Adult Health

## 2011-02-21 ENCOUNTER — Ambulatory Visit (INDEPENDENT_AMBULATORY_CARE_PROVIDER_SITE_OTHER): Payer: Self-pay | Admitting: Adult Health

## 2011-02-21 DIAGNOSIS — B20 Human immunodeficiency virus [HIV] disease: Secondary | ICD-10-CM

## 2011-02-21 DIAGNOSIS — I10 Essential (primary) hypertension: Secondary | ICD-10-CM

## 2011-02-21 LAB — CONVERTED CEMR LAB
ALT: 14 units/L (ref 0–35)
Albumin: 4.2 g/dL (ref 3.5–5.2)
CO2: 25 meq/L (ref 19–32)
Calcium: 8.9 mg/dL (ref 8.4–10.5)
Chloride: 106 meq/L (ref 96–112)
Cholesterol: 179 mg/dL (ref 0–200)
Eosinophils Absolute: 0.2 10*3/uL (ref 0.0–0.7)
Lymphocytes Relative: 43 % (ref 12–46)
Lymphs Abs: 1.2 10*3/uL (ref 0.7–4.0)
Neutro Abs: 1.1 10*3/uL — ABNORMAL LOW (ref 1.7–7.7)
Neutrophils Relative %: 38 % — ABNORMAL LOW (ref 43–77)
Platelets: 142 10*3/uL — ABNORMAL LOW (ref 150–400)
Sodium: 138 meq/L (ref 135–145)
Total CHOL/HDL Ratio: 3.1
Total Protein: 7.2 g/dL (ref 6.0–8.3)
Triglycerides: 79 mg/dL (ref <150)
VLDL: 16 mg/dL (ref 0–40)
WBC: 2.9 10*3/uL — ABNORMAL LOW (ref 4.0–10.5)

## 2011-02-22 LAB — T-HELPER CELL (CD4) - (RCID CLINIC ONLY): CD4 T Cell Abs: 240 uL — ABNORMAL LOW (ref 400–2700)

## 2011-02-25 ENCOUNTER — Ambulatory Visit: Payer: Medicaid Other | Admitting: Speech Pathology

## 2011-02-26 NOTE — Assessment & Plan Note (Signed)
Summary: ED F/U/VS   Vital Signs:  Patient profile:   45 year old female Menstrual status:  irregular Height:      64 inches Weight:      167 pounds BMI:     28.77 Temp:     97.8 degrees F oral Pulse rate:   87 / minute BP sitting:   169 / 115  (right arm)  Vitals Entered By: Alesia Morin CMA (January 25, 2011 3:32 PM) CC: pt in for uncontrolled BP and the ER sent her home with a Rx and wants to know if it will be ok with her HIV meds, she also has a itch in the vaginal area and some discharge Is Patient Diabetic? No Pain Assessment Patient in pain? no      Nutritional Status BMI of 25 - 29 = overweight Nutritional Status Detail appetite "ok"  Have you ever been in a relationship where you felt threatened, hurt or afraid?No   Does patient need assistance? Functional Status Self care Ambulation Normal   CC:  pt in for uncontrolled BP and the ER sent her home with a Rx and wants to know if it will be ok with her HIV meds and she also has a itch in the vaginal area and some discharge.  History of Present Illness: Recent treatment for R _______ CVA with Left side weakness and facial droop.  Recently seen in ED for hypertensive episode and associated headache.  Was given Rx for hydrochlorothizide, but did not fill Rx until she knew there were no interactions with HIV meds.  Also c/o new onset vaginal infection.  Has been treated recurrentlyfor BV which resolves with metronidazole therapy.  Current symptoms include pruritic "fishy" smelling vaginal dschg similar to her previous episodes.  Preventive Screening-Counseling & Management  Alcohol-Tobacco     Alcohol drinks/day: socially     Smoking Status: current     Smoke Cessation Stage: precontemplative     Packs/Day: 1/2  Caffeine-Diet-Exercise     Caffeine use/day: coffee 2 per day     Does Patient Exercise: yes     Type of exercise: gym     Times/week: <3  Hep-HIV-STD-Contraception     HIV Risk: no     Sun  Exposure-Excessive: occasionally  Safety-Violence-Falls     Seat Belt Use: 100      Sexual History:  currently monogamous.        Drug Use:  never and no.    Allergies (verified): No Known Drug Allergies  Past History:  Past medical, surgical, family and social histories (including risk factors) reviewed for relevance to current acute and chronic problems.  Past Medical History: Reviewed history from 08/06/2007 and no changes required. HIV disease Hypertension  Family History: Reviewed history and no changes required.  Social History: Reviewed history and no changes required.  Review of Systems General:  Denies chills, fatigue, fever, loss of appetite, malaise, sleep disorder, sweats, weakness, and weight loss. Eyes:  Denies blurring, discharge, double vision, eye irritation, eye pain, halos, itching, light sensitivity, red eye, vision loss-1 eye, and vision loss-both eyes. ENT:  Denies decreased hearing, difficulty swallowing, ear discharge, earache, hoarseness, nasal congestion, nosebleeds, postnasal drainage, ringing in ears, sinus pressure, and sore throat. CV:  Denies bluish discoloration of lips or nails, chest pain or discomfort, difficulty breathing at night, difficulty breathing while lying down, fainting, fatigue, leg cramps with exertion, lightheadness, near fainting, palpitations, shortness of breath with exertion, swelling of feet, swelling of hands, and weight  gain. Resp:  Denies chest discomfort, chest pain with inspiration, cough, coughing up blood, excessive snoring, hypersomnolence, morning headaches, pleuritic, shortness of breath, sputum productive, and wheezing. GI:  Complains of constipation and gas; denies abdominal pain, bloody stools, change in bowel habits, dark tarry stools, diarrhea, excessive appetite, hemorrhoids, indigestion, loss of appetite, nausea, vomiting, vomiting blood, and yellowish skin color. Derm:  Denies changes in color of skin, changes in  nail beds, dryness, excessive perspiration, flushing, hair loss, insect bite(s), itching, lesion(s), poor wound healing, and rash. Neuro:  Complains of disturbances in coordination, headaches, poor balance, and weakness. Psych:  Complains of depression, easily angered, and irritability; denies suicidal thoughts/plans, unusual visions or sounds, and thoughts /plans of harming others.  Physical Exam  General:  alert, well-developed, well-nourished, and well-hydrated.   Head:  normocephalic and atraumatic.   Eyes:  vision grossly intact, pupils equal, pupils round, and pupils reactive to light.   Mouth:  Left facial droop Neck:  supple and full ROM.   Lungs:  normal respiratory effort and normal breath sounds.   Heart:  normal rate, regular rhythm, no murmur, no gallop, no rub, and no JVD.   Abdomen:  soft, non-tender, normal bowel sounds, no hepatomegaly, no splenomegaly, and bowel sounds hypoactive.   Extremities:  No clubbing, cyanosis, edema, or deformity noted with normal full range of motion of all joints.   Neurologic:  alert & oriented X3, speech clear without dysarthria or slurr.  Left facial droop with smile.  Left-sided weakness but still 4/4 with R > L.  Gait guarded favoring left but stableDTRs symmetrical and normal.   Skin:  turgor normal, color normal, no rashes, and no suspicious lesions.   Psych:  Oriented X3, memory intact for recent and remote, normally interactive, good eye contact, not anxious appearing, and not depressed appearing.      Impression & Recommendations:  Problem # 1:  HYPERTENSION (ICD-401.9) Has yet to start hydrochlorothiazide.  Informed no interaction problems with med and HIV therapy.  Instructed her to begin therapy as soon as she has it filled.  She is to check her BP at home at least 2x/wk and she is to come to clinic in 2 weeks for  nurse visit to check BP.  If BP remains elevated we should consider adding ACE Inhibitor therapy to her regimen. Her  updated medication list for this problem includes:    Atenolol 100 Mg Tabs (Atenolol) .Marland Kitchen... Take 1 tablet by mouth once a day  Problem # 2:  VAGINAL DISCHARGE (ICD-623.5) Will renew metronidazole 500mg  by mouth two times a day x 10 days.  Instructed if dschg either does not clear or recurs, she needs to come in for anothervaginal exam. Her updated medication list for this problem includes:    Metrogel-vaginal 0.75 % Gel (Metronidazole) ..... 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  At present, she seems to managing well independently.  A review of her hospitalization and course of therapy would be helpful to determine appropriate eferrals and f/u.  Meanwhile, BP control at this stage is important to prevent recurrence of another CVA.     Problem # 4:  HIV DISEASE (ICD-042) Last CD4 was <200, but she has not been taking her Bactrim PCP prophylaxis.   Extensive discussion regarding this and reasosn for continuing therapy for now was made.  Verbally acknowledged and agreed with  plan to continue Bactrim along with ARV therapy.  We will have her RTC for lbs in 4 weeks with a formal f/u visit in 6 weeks. Prescriptions: METRONIDAZOLE 500 MG TABS (METRONIDAZOLE) take one tablet two times a day x 7 days  #14 x 0   Entered and Authorized by:   Talmadge Chad NP   Signed by:   Talmadge Chad NP on 01/25/2011   Method used:   Print then Give to Patient   RxID:   [id number]

## 2011-02-26 NOTE — Assessment & Plan Note (Signed)
Summary: resch from 2/13 f/u on bp meds [mkj]   Vital Signs:  Patient profile:   45 year old female Menstrual status:  irregular Height:      64 inches Weight:      178 pounds BMI:     30.66 Temp:     98 degrees F oral Pulse rate:   69 / minute BP sitting:   149 / 105  (left arm)  Vitals Entered By: Alesia Morin CMA (February 21, 2011 9:49 AM) CC: follow-up visit for BP check, pt says that she has not felt good she has had headached and been dizzy and light headed with some nauesa. Is Patient Diabetic? No Pain Assessment Patient in pain? no      Nutritional Status BMI of > 30 = obese Nutritional Status Detail appetite "good"  Have you ever been in a relationship where you felt threatened, hurt or afraid?No   Does patient need assistance? Functional Status Self care Ambulation Normal Comments no missed meds   CC:  follow-up visit for BP check and pt says that she has not felt good she has had headached and been dizzy and light headed with some nauesa.Marland Kitchen  History of Present Illness: In for BP check.  Ran out of HCTZ 6 days back.  Claims she notified clinic, but was informed we did not want to refill med until we were certain medication was working.  Now having episodes of dizziness, heaches and weaknee.  Bp diastolically has been between 90-95 before she ran out of HCTZ.  Also wants to know if she can start going to gym to work out.  Preventive Screening-Counseling & Management  Alcohol-Tobacco     Alcohol drinks/day: socially     Smoking Status: current     Smoke Cessation Stage: precontemplative     Packs/Day: 1/2  Caffeine-Diet-Exercise     Caffeine use/day: coffee 2 per day     Does Patient Exercise: yes     Type of exercise: gym     Times/week: <3  Hep-HIV-STD-Contraception     HIV Risk: no     Sun Exposure-Excessive: occasionally  Safety-Violence-Falls     Seat Belt Use: 100      Sexual History:  currently monogamous.        Drug Use:  never and no.      Blood Transfusions:  no.        Travel History:  no.    Comments: pt declined condoms  Allergies: No Known Drug Allergies  Social History: Blood Transfusions:  no Travel History:  no  Review of Systems       The patient complains of headaches and muscle weakness.  The patient denies anorexia, fever, weight loss, weight gain, vision loss, decreased hearing, hoarseness, chest pain, syncope, dyspnea on exertion, peripheral edema, prolonged cough, hemoptysis, abdominal pain, melena, hematochezia, severe indigestion/heartburn, hematuria, incontinence, genital sores, suspicious skin lesions, transient blindness, difficulty walking, depression, unusual weight change, abnormal bleeding, enlarged lymph nodes, angioedema, and breast masses.         See HPI  Physical Exam  General:  alert, well-developed, well-nourished, and well-hydrated.   Head:  normocephalic and atraumatic.   Eyes:  vision grossly intact, pupils equal, pupils round, and pupils reactive to light.   Ears:  R ear normal and L ear normal.   Nose:  no external deformity, no external erythema, and no nasal discharge.   Mouth:  pharynx pink and moist.   Neck:  supple, full ROM,  and no masses.   Lungs:  normal respiratory effort and normal breath sounds.   Heart:  normal rate, regular rhythm, no murmur, no gallop, no rub, and no JVD.   Abdomen:  soft, non-tender, and normal bowel sounds.   Msk:  normal ROM.   Extremities:  No clubbing, cyanosis, edema, or deformity noted with normal full range of motion of all joints.   Neurologic:  alert & oriented X3, cranial nerves II-XII intact, and gait normal.  Some weakness on left > right but ovreral motor strength is 5/5 Skin:  Intact without suspicious lesions or rashes Psych:  Cognition and judgment appear intact. Alert and cooperative with normal attention span and concentration. No apparent delusions, illusions, hallucinations   Impression & Recommendations:  Problem # 1:   HYPERTENSION (ICD-401.9) BP 144/102 after 2 doses clonidine 0.1 mg SL.  Renew HCTZ and add Lisinopril 20 mg once daily.  Recheck BP 2x/ week.  RTC 2 weeks for f/u.  Verbally acknowledged.  Instructed we will consider medical release to participate in gym activites after we have re-assessed her BP statusand then after we have sent her for a stress test.  Verbally acknowleedged this as well. Her updated medication list for this problem includes:    Atenolol 100 Mg Tabs (Atenolol) .Marland Kitchen... Take 1 tablet by mouth once a day    Hydrochlorothiazide 25 Mg Tabs (Hydrochlorothiazide) .Marland Kitchen... 1 tab by mouth once daily    Lisinopril 20 Mg Tabs (Lisinopril) .Marland Kitchen... 1 tab by mouth once daily  Orders: Clonidine 0.1mg  tab [facility]) Admin of patients own med IM/SQ (16109U) T-Lipid Profile ([id number]) Est. Patient Level IV (14782)  Problem # 2:  HIV DISEASE (ICD-042) Staging labs + lipids today.  CPM until labs reviewed.  F/U in 2 weeks. Her updated medication list for this problem includes:    Metronidazole 500 Mg Tabs (Metronidazole) .Marland Kitchen... Take one tablet two times a day x 7 days    Bactrim Ds 800-160 Mg Tabs (Sulfamethoxazole-trimethoprim) .Marland Kitchen... Take 1 tablet by mouth once a day  Orders: T-CBC w/Diff [phone]) T-CD4SP [facility]) (CD4SP) T-Comprehensive Metabolic Panel [phone]) T-HIV Viral Load [phone]) Est. Patient Level IV (27253)  Medications Added to Medication List This Visit: 1)  Hydrochlorothiazide 25 Mg Tabs (Hydrochlorothiazide) .Marland Kitchen.. 1 tab by mouth once daily 2)  Lisinopril 20 Mg Tabs (Lisinopril) .Marland Kitchen.. 1 tab by mouth once daily Prescriptions: LISINOPRIL 20 MG TABS (LISINOPRIL) 1 tab by mouth once daily  #30 x 0   Entered and Authorized by:   Talmadge Chad NP   Signed by:   Talmadge Chad NP on 02/21/2011   Method used:   Print then Give to Patient   RxID:   [id number] HYDROCHLOROTHIAZIDE 25 MG TABS (HYDROCHLOROTHIAZIDE) 1 tab by mouth once daily  #90 x 1    Entered and Authorized by:   Talmadge Chad NP   Signed by:   Talmadge Chad NP on 02/21/2011   Method used:   Print then Give to Patient   RxID:   [id number]    Medication Administration  Medication # 1:    Medication: Clonidine 0.1mg  tab    Diagnosis: HYPERTENSION (ICD-401.9)    Dose: 1 tablet    Route: SL    Exp Date: 03/14/2012    Lot #: [id number]    Mfr: [employer]    Patient tolerated medication without complications    Given by: Alesia Morin CMA (February 21, 2011 10:28 AM)  Orders Added: 1)  Clonidine 0.1mg  tab [EMRORAL] 2)  Admin of patients own med IM/SQ [96372M] 3)  T-CBC w/Diff [[id number]] 4)  T-CD4SP [facility]) [CD4SP] 5)  T-Comprehensive Metabolic Panel [80053-22900] 6)  T-HIV Viral Load [phone] 7)  T-Lipid Profile [80061-22930] 8)  Est. Patient Level IV [84696]

## 2011-02-27 ENCOUNTER — Ambulatory Visit: Payer: Medicaid Other | Admitting: Occupational Therapy

## 2011-03-04 ENCOUNTER — Ambulatory Visit: Payer: Medicaid Other

## 2011-03-04 ENCOUNTER — Ambulatory Visit: Payer: Medicaid Other | Admitting: Occupational Therapy

## 2011-03-04 LAB — T-HELPER CELL (CD4) - (RCID CLINIC ONLY): CD4 % Helper T Cell: 15 % — ABNORMAL LOW (ref 33–55)

## 2011-03-06 ENCOUNTER — Ambulatory Visit: Payer: Medicaid Other | Admitting: Occupational Therapy

## 2011-03-06 ENCOUNTER — Ambulatory Visit: Payer: Medicaid Other | Admitting: Speech Pathology

## 2011-03-07 ENCOUNTER — Ambulatory Visit (INDEPENDENT_AMBULATORY_CARE_PROVIDER_SITE_OTHER): Payer: Self-pay | Admitting: Adult Health

## 2011-03-07 ENCOUNTER — Encounter: Payer: Self-pay | Admitting: Adult Health

## 2011-03-07 DIAGNOSIS — L219 Seborrheic dermatitis, unspecified: Secondary | ICD-10-CM | POA: Insufficient documentation

## 2011-03-07 DIAGNOSIS — E785 Hyperlipidemia, unspecified: Secondary | ICD-10-CM

## 2011-03-07 DIAGNOSIS — B9689 Other specified bacterial agents as the cause of diseases classified elsewhere: Secondary | ICD-10-CM

## 2011-03-07 DIAGNOSIS — Z21 Asymptomatic human immunodeficiency virus [HIV] infection status: Secondary | ICD-10-CM

## 2011-03-07 DIAGNOSIS — B20 Human immunodeficiency virus [HIV] disease: Secondary | ICD-10-CM

## 2011-03-07 DIAGNOSIS — N760 Acute vaginitis: Secondary | ICD-10-CM

## 2011-03-07 DIAGNOSIS — J309 Allergic rhinitis, unspecified: Secondary | ICD-10-CM

## 2011-03-07 DIAGNOSIS — I10 Essential (primary) hypertension: Secondary | ICD-10-CM

## 2011-03-07 DIAGNOSIS — A499 Bacterial infection, unspecified: Secondary | ICD-10-CM

## 2011-03-07 MED ORDER — ATENOLOL 100 MG PO TABS: 50.0000 mg | ORAL_TABLET | Freq: Every day | ORAL | Status: DC

## 2011-03-07 MED ORDER — NYSTATIN 100000 UNIT/GM EX CREA: TOPICAL_CREAM | Freq: Two times a day (BID) | CUTANEOUS | Status: DC

## 2011-03-07 MED ORDER — HYDROCORTISONE 1 % EX LOTN: TOPICAL_LOTION | Freq: Two times a day (BID) | CUTANEOUS | Status: DC

## 2011-03-07 NOTE — Progress Notes (Signed)
  Subjective:    Patient ID: Abigail Medina, female    DOB: July 10, 1966, 45 y.o.   MRN: [id number]  HPI Comments: Doing much better with BP control, except evening BP's dropping below 100 mmHg systolic.  Taking HCTZ, atenolol, and lisinopril.   Hypertension This is a chronic problem. The current episode started more than 1 year ago. The problem has been rapidly improving since onset. The problem is controlled. Associated symptoms include headaches (unchanged from previously). Risk factors for coronary artery disease include dyslipidemia, family history, smoking/tobacco exposure, stress and sedentary lifestyle.  HIV Positive/AIDS Disease stability: stable.     Substance Abuse: Counseled about substance abuse: No.  Doing well on new ARV regimen with good tolerance.    Review of Systems  Eyes: Negative.   Respiratory: Negative.   Cardiovascular: Negative.   Gastrointestinal: Negative.   Genitourinary: Negative.   Musculoskeletal: Negative.   Skin: Positive for rash.  Neurological: Positive for weakness (improved LUE strength but still with weakness) and headaches (unchanged from previously).  Hematological: Negative.   Psychiatric/Behavioral: Negative.   Noticing patchy dry flaking rash on face     Objective:   Physical Exam  Constitutional: She is oriented to person, place, and time. She appears well-developed and well-nourished. No distress.  HENT:  Head: Normocephalic and atraumatic.  Right Ear: External ear normal.  Left Ear: External ear normal.  Nose: Nose normal.  Mouth/Throat: Oropharynx is clear and moist. No oropharyngeal exudate.  Eyes: Conjunctivae and EOM are normal. Pupils are equal, round, and reactive to light. Right eye exhibits no discharge. Left eye exhibits no discharge. No scleral icterus.  Neck: Normal range of motion. Neck supple. No JVD present. No tracheal deviation present. No thyromegaly present.  Cardiovascular: Normal rate, regular rhythm, normal heart  sounds and intact distal pulses.  Exam reveals no gallop and no friction rub.   No murmur heard. Pulmonary/Chest: Effort normal and breath sounds normal. No stridor.  Abdominal: Soft. Bowel sounds are normal.  Musculoskeletal: Normal range of motion. She exhibits no edema.  Lymphadenopathy:    She has cervical adenopathy.  Neurological: She is alert and oriented to person, place, and time. She has normal reflexes. A cranial nerve deficit (slight left facial droop with smile) is present. She exhibits abnormal muscle tone (muscle tone normal on right, and diminished on left).  Skin: Skin is warm and dry. Rash (hyperkeratotic seborrheic patches on right cheek, left eye brow and left upper lip regions) noted. She is not diaphoretic. No erythema. No pallor.  Psychiatric: She has a normal mood and affect. Her behavior is normal. Judgment and thought content normal.          Assessment & Plan:  1. HIV - CD4 226 @ 17% marked improvement with VL < 20 copies/ml.  CPM check labs again in 6 weeks and RTC f/u in 2 months. 2.. Facial seborrheic dermatitis.  Hydrocortisone 1% cream bid.  And nystatin cream 100000 U bid. To affected areas. 3.  Hypertension.  Maybe overcompensating.  Will decrease atenolol to 50mg  qd and have her check her BP daily.  To call clinic if systolic BP continues to rise over 140 mmHg. 4. R CVA.:Improving, may begin exercise regimens, light only (walking), and to stop if there is any dizziness

## 2011-03-12 ENCOUNTER — Ambulatory Visit: Payer: Medicaid Other | Admitting: Occupational Therapy

## 2011-03-12 ENCOUNTER — Ambulatory Visit: Payer: Medicaid Other

## 2011-03-14 ENCOUNTER — Ambulatory Visit: Payer: Medicaid Other | Admitting: Occupational Therapy

## 2011-03-14 ENCOUNTER — Ambulatory Visit: Payer: Medicaid Other

## 2011-03-19 ENCOUNTER — Ambulatory Visit: Payer: Medicaid Other | Attending: Neurology

## 2011-03-19 ENCOUNTER — Ambulatory Visit: Payer: Medicaid Other | Admitting: Occupational Therapy

## 2011-03-19 DIAGNOSIS — I69991 Dysphagia following unspecified cerebrovascular disease: Secondary | ICD-10-CM | POA: Insufficient documentation

## 2011-03-19 DIAGNOSIS — R279 Unspecified lack of coordination: Secondary | ICD-10-CM | POA: Insufficient documentation

## 2011-03-19 DIAGNOSIS — R1310 Dysphagia, unspecified: Secondary | ICD-10-CM | POA: Insufficient documentation

## 2011-03-19 DIAGNOSIS — R5381 Other malaise: Secondary | ICD-10-CM | POA: Insufficient documentation

## 2011-03-19 DIAGNOSIS — I69922 Dysarthria following unspecified cerebrovascular disease: Secondary | ICD-10-CM | POA: Insufficient documentation

## 2011-03-19 DIAGNOSIS — Z5189 Encounter for other specified aftercare: Secondary | ICD-10-CM | POA: Insufficient documentation

## 2011-03-19 DIAGNOSIS — R269 Unspecified abnormalities of gait and mobility: Secondary | ICD-10-CM | POA: Insufficient documentation

## 2011-03-19 DIAGNOSIS — M6281 Muscle weakness (generalized): Secondary | ICD-10-CM | POA: Insufficient documentation

## 2011-03-19 DIAGNOSIS — I69998 Other sequelae following unspecified cerebrovascular disease: Secondary | ICD-10-CM | POA: Insufficient documentation

## 2011-03-21 ENCOUNTER — Ambulatory Visit: Payer: Medicaid Other

## 2011-03-21 ENCOUNTER — Ambulatory Visit: Payer: Medicaid Other | Admitting: Occupational Therapy

## 2011-03-25 ENCOUNTER — Other Ambulatory Visit: Payer: Self-pay | Admitting: Adult Health

## 2011-03-25 DIAGNOSIS — I10 Essential (primary) hypertension: Secondary | ICD-10-CM

## 2011-03-27 ENCOUNTER — Ambulatory Visit: Payer: Medicaid Other | Admitting: Speech Pathology

## 2011-03-27 ENCOUNTER — Ambulatory Visit: Payer: Self-pay

## 2011-03-27 ENCOUNTER — Ambulatory Visit: Payer: Medicaid Other | Admitting: Occupational Therapy

## 2011-03-29 ENCOUNTER — Ambulatory Visit: Payer: Medicaid Other | Admitting: Occupational Therapy

## 2011-04-02 ENCOUNTER — Ambulatory Visit: Payer: Medicaid Other | Admitting: Occupational Therapy

## 2011-04-02 ENCOUNTER — Ambulatory Visit: Payer: Medicaid Other

## 2011-04-03 ENCOUNTER — Encounter: Payer: Self-pay | Admitting: Speech Pathology

## 2011-04-03 ENCOUNTER — Encounter: Payer: Self-pay | Admitting: Occupational Therapy

## 2011-04-04 ENCOUNTER — Other Ambulatory Visit: Payer: Self-pay | Admitting: Adult Health

## 2011-04-04 ENCOUNTER — Ambulatory Visit: Payer: Medicaid Other | Admitting: Occupational Therapy

## 2011-04-04 ENCOUNTER — Other Ambulatory Visit: Payer: Self-pay

## 2011-04-04 DIAGNOSIS — B20 Human immunodeficiency virus [HIV] disease: Secondary | ICD-10-CM

## 2011-04-04 DIAGNOSIS — Z113 Encounter for screening for infections with a predominantly sexual mode of transmission: Secondary | ICD-10-CM

## 2011-04-09 ENCOUNTER — Ambulatory Visit: Payer: Medicaid Other | Admitting: Occupational Therapy

## 2011-04-09 ENCOUNTER — Ambulatory Visit: Payer: Medicaid Other

## 2011-04-11 ENCOUNTER — Ambulatory Visit: Payer: Medicaid Other | Admitting: Occupational Therapy

## 2011-04-16 ENCOUNTER — Encounter: Payer: Self-pay | Admitting: Occupational Therapy

## 2011-04-18 ENCOUNTER — Encounter: Payer: Self-pay | Admitting: Adult Health

## 2011-04-18 ENCOUNTER — Ambulatory Visit (INDEPENDENT_AMBULATORY_CARE_PROVIDER_SITE_OTHER): Payer: Self-pay | Admitting: Adult Health

## 2011-04-18 VITALS — BP 118/83 | HR 59 | Temp 97.9°F | Ht 64.0 in | Wt 179.0 lb

## 2011-04-18 DIAGNOSIS — L210 Seborrhea capitis: Secondary | ICD-10-CM | POA: Insufficient documentation

## 2011-04-18 DIAGNOSIS — L738 Other specified follicular disorders: Secondary | ICD-10-CM

## 2011-04-18 DIAGNOSIS — Z23 Encounter for immunization: Secondary | ICD-10-CM

## 2011-04-18 DIAGNOSIS — B20 Human immunodeficiency virus [HIV] disease: Secondary | ICD-10-CM

## 2011-04-18 DIAGNOSIS — Z79899 Other long term (current) drug therapy: Secondary | ICD-10-CM

## 2011-04-18 DIAGNOSIS — L739 Follicular disorder, unspecified: Secondary | ICD-10-CM

## 2011-04-18 NOTE — Progress Notes (Signed)
Subjective:    Patient ID: Abigail Medina, female    DOB: [birth year], 45 y.o.   MRN: [id number]  HPI Presents for followup. Has been adherent to her new antiretroviral regimen with good tolerance as well as her antihypertensive therapy with very good response. Her chief complaint today involves recurrence of a dry, flaky, scaly, pruritic scalp with, "bad dandruff." She denies any use of any special hair products and uses "regular shampoo." For washing her hair. She also relates having a few "bumps"on the back of her neck and 2 spots on her arms. States they are pruritic, come to a head, and go away.   Review of Systems  Constitutional: Negative.   HENT: Negative.        Hair and scalp complaints as per history of present illness. Grossly  Eyes: Negative.   Respiratory: Negative.   Cardiovascular: Negative.   Gastrointestinal: Negative.   Genitourinary: Negative.   Musculoskeletal: Negative.   Skin: Positive for wound.       Skin lesions, as described per history of present illness.   Neurological: Negative.   Hematological: Negative.   Psychiatric/Behavioral: Negative.        Objective:   Physical Exam  Constitutional: She is oriented to person, place, and time. She appears well-developed and well-nourished.  HENT:  Head: Normocephalic and atraumatic. Hair is abnormal.  Right Ear: External ear normal.  Left Ear: External ear normal.  Mouth/Throat: Oropharyngeal exudate present.       Diffuse xerotic skin patches covering most areas of scalp with dry flaking skin intermixed with hair. Scalp base appears seborrheic hyperkeratotic.  Eyes: Conjunctivae and EOM are normal. Pupils are equal, round, and reactive to light. Right eye exhibits no discharge. Left eye exhibits no discharge.  Neck: Normal range of motion. Neck supple. No JVD present. No tracheal deviation present. No thyromegaly present.  Cardiovascular: Normal rate, regular rhythm, normal heart sounds and intact distal  pulses.  Exam reveals no gallop and no friction rub.   No murmur heard. Pulmonary/Chest: Effort normal and breath sounds normal.  Abdominal: Soft. Bowel sounds are normal.  Musculoskeletal: Normal range of motion. She exhibits no edema and no tenderness.  Lymphadenopathy:    She has no cervical adenopathy.  Neurological: She is alert and oriented to person, place, and time. No cranial nerve deficit. She exhibits normal muscle tone. Coordination normal.  Skin: Skin is warm and dry.       3 punctate follicular lesions noted, with 1 at the base of the neck posteriorly 1 on the left wrist and one on the right forearm. All lesions appear to be healing with no active inflammation noted.  Psychiatric: She has a normal mood and affect. Her behavior is normal. Judgment and thought content normal.          Assessment & Plan:  1. HIV. No recent labs are available for evaluation, but labs were drawn on 02/21/2011, showed a CD4 count of 240 at 17%, and a viral load less than 20 copies per mL. She is stable on her current regimen, and we would recommend continuing her present management. She draw staging labs today and repeat labs again in 10 weeks. If her CD4 count remains greater than 200 cells per cubic millimeter in 10 weeks. We will discontinue her Bactrim. She verbally acknowledged this and agreed with plan.  2. Seborrheic Dermatitis of the Scalp. Instructed her that perhaps at this point he might be necessary for her to have her hair cut rather  short this time. We did recommend that she washes her hair daily with selenium sulfide shampoo (Selsun Blue). She openly agreed to this and showed no objection to having her hair cut considering her problems. I instructed her that should the symptoms persist over the next month to contact the clinic and we will call in a prescription for a different hair shampoo that she might use, but keeping hair, short, and daily use of Selsun Blue should improve this  condition. She verbally acknowledged this.  3. Isolated Follicular Lesion. Recommended to wash areas with a antibacterial soap and to apply triple antibiotic ointment to the areas should they recur again. If lesions become inflamed, do not drain, and/or become painful, she is to notify the clinic. Verbally acknowledged this information and agreed with plan of care. Sleep

## 2011-04-19 LAB — COMPREHENSIVE METABOLIC PANEL
AST: 24 U/L (ref 0–37)
Albumin: 4.1 g/dL (ref 3.5–5.2)
Alkaline Phosphatase: 72 U/L (ref 39–117)
BUN: 16 mg/dL (ref 6–23)
Creat: 0.76 mg/dL (ref 0.40–1.20)
Glucose, Bld: 84 mg/dL (ref 70–99)
Potassium: 4.1 mEq/L (ref 3.5–5.3)

## 2011-04-19 LAB — CBC WITH DIFFERENTIAL/PLATELET
Basophils Absolute: 0 10*3/uL (ref 0.0–0.1)
Basophils Relative: 0 % (ref 0–1)
Eosinophils Absolute: 0.2 10*3/uL (ref 0.0–0.7)
Eosinophils Relative: 7 % — ABNORMAL HIGH (ref 0–5)
HCT: 37.3 % (ref 36.0–46.0)
Hemoglobin: 12.4 g/dL (ref 12.0–15.0)
MCH: 28.9 pg (ref 26.0–34.0)
MCHC: 33.2 g/dL (ref 30.0–36.0)
MCV: 86.9 fL (ref 78.0–100.0)
Monocytes Absolute: 0.3 10*3/uL (ref 0.1–1.0)
Monocytes Relative: 10 % (ref 3–12)
RDW: 13.3 % (ref 11.5–15.5)

## 2011-04-19 LAB — T-HELPER CELL (CD4) - (RCID CLINIC ONLY): CD4 % Helper T Cell: 16 % — ABNORMAL LOW (ref 33–55)

## 2011-04-21 LAB — HIV-1 RNA QUANT-NO REFLEX-BLD: HIV-1 RNA Quant, Log: <1.3 {Log} (ref <1.30)

## 2011-04-24 ENCOUNTER — Ambulatory Visit: Payer: Medicaid Other | Attending: Neurology | Admitting: Occupational Therapy

## 2011-04-24 DIAGNOSIS — M6281 Muscle weakness (generalized): Secondary | ICD-10-CM | POA: Insufficient documentation

## 2011-04-24 DIAGNOSIS — R279 Unspecified lack of coordination: Secondary | ICD-10-CM | POA: Insufficient documentation

## 2011-04-24 DIAGNOSIS — R1310 Dysphagia, unspecified: Secondary | ICD-10-CM | POA: Insufficient documentation

## 2011-04-24 DIAGNOSIS — I69922 Dysarthria following unspecified cerebrovascular disease: Secondary | ICD-10-CM | POA: Insufficient documentation

## 2011-04-24 DIAGNOSIS — R269 Unspecified abnormalities of gait and mobility: Secondary | ICD-10-CM | POA: Insufficient documentation

## 2011-04-24 DIAGNOSIS — R5381 Other malaise: Secondary | ICD-10-CM | POA: Insufficient documentation

## 2011-04-24 DIAGNOSIS — I69991 Dysphagia following unspecified cerebrovascular disease: Secondary | ICD-10-CM | POA: Insufficient documentation

## 2011-04-24 DIAGNOSIS — Z5189 Encounter for other specified aftercare: Secondary | ICD-10-CM | POA: Insufficient documentation

## 2011-04-24 DIAGNOSIS — I69998 Other sequelae following unspecified cerebrovascular disease: Secondary | ICD-10-CM | POA: Insufficient documentation

## 2011-04-26 ENCOUNTER — Other Ambulatory Visit (HOSPITAL_COMMUNITY)
Admission: RE | Admit: 2011-04-26 | Discharge: 2011-04-26 | Disposition: A | Discharge status: Home / Self Care | Payer: Medicaid Other | Source: Ambulatory Visit | Attending: Adult Health | Admitting: Adult Health

## 2011-04-26 ENCOUNTER — Encounter: Payer: Self-pay | Admitting: Occupational Therapy

## 2011-04-26 ENCOUNTER — Ambulatory Visit (INDEPENDENT_AMBULATORY_CARE_PROVIDER_SITE_OTHER): Payer: Medicaid Other | Admitting: *Deleted

## 2011-04-26 ENCOUNTER — Other Ambulatory Visit: Payer: Self-pay | Admitting: *Deleted

## 2011-04-26 DIAGNOSIS — B20 Human immunodeficiency virus [HIV] disease: Secondary | ICD-10-CM

## 2011-04-26 DIAGNOSIS — IMO0001 Reserved for inherently not codable concepts without codable children: Secondary | ICD-10-CM

## 2011-04-26 DIAGNOSIS — Z124 Encounter for screening for malignant neoplasm of cervix: Secondary | ICD-10-CM | POA: Insufficient documentation

## 2011-04-26 DIAGNOSIS — A6000 Herpesviral infection of urogenital system, unspecified: Secondary | ICD-10-CM

## 2011-04-26 DIAGNOSIS — R87610 Atypical squamous cells of undetermined significance on cytologic smear of cervix (ASC-US): Secondary | ICD-10-CM

## 2011-04-26 DIAGNOSIS — R87810 Cervical high risk human papillomavirus (HPV) DNA test positive: Secondary | ICD-10-CM | POA: Insufficient documentation

## 2011-04-26 MED ORDER — VALACYCLOVIR HCL 1 G PO TABS: 1000.0000 mg | ORAL_TABLET | Freq: Two times a day (BID) | ORAL | Status: DC

## 2011-04-26 MED ORDER — EMTRICITABINE-TENOFOVIR DF 200-300 MG PO TABS: 1.0000 | ORAL_TABLET | Freq: Every day | ORAL | Status: DC

## 2011-04-26 NOTE — Patient Instructions (Addendum)
  Pt has previously had a abnormal PAP smear and a COLPO over a year ago.  She c/o spotting after intercourse.  She had stopped having periods for the year following the COLPO.  She started having irregular periods one in Feb. 2012 and another in March 2012.  She also c/o perineal itching with vaginal discharge.  Upon inspection the pt has vesicular lesions located the labia major and the perinium.  Reported lesions to B. Farringtion, NP who ordered Valtrex 1 gram bid x 7 days.  Rx sent to [facility] on [address].  RN stated that pt will be contacted with results and possible referral to GYN MD at [facility].  Jennet Maduro, RN   Results will be available in about a week.  Abnormal results.  Pt referred to [facility] clinic.  First available appt June 24, 2011 @ 1:30.  Pt telephoned and mailed appt information.  Jennet Maduro, RN.

## 2011-04-26 NOTE — Progress Notes (Signed)
  Subjective:     Shakera Ebrahimi is a 45 y.o. woman who comes in today for a  pap smear only.    Review of Systems Pt. Complained of perineal itching intermittently for the past year.  Lesions that come and go that end with a scab.  She also stated that every time she has intercourse she has spotting.  History of previous abnormal PAP smear and COLPO per the pt.    Objective:    There were no vitals taken for this visit. Pelvic Exam:  Vesicular lesions observed on perineum. Vaginal tissue dry and friable.  Pap smear test causing small amount of bleeding from vaginal tissue.  Pap smear obtained.   Assessment:  Discussed vesicular lesions with Traci Sermon, NP.  Ordered Valtrex 1 gram bid for 7 days to be called in to the pharmacy.   Screening pap smear.   Plan:  Possible referral to GYN MD for evaluation of Pap smear results and "vaginal dryness.    Follow up in one year, or as indicated by Pap results.   PAP results = ASC-US, HPV high risk DETECTED.  Obtained order from B. Sundra Aland, NP for referral.  Referral to [facility] clinic.  Form completed and faxed.  Jennet Maduro, RN

## 2011-04-26 NOTE — Telephone Encounter (Signed)
Patient needs refill of truvada and someone else is in the chart so they will send it over

## 2011-04-30 ENCOUNTER — Ambulatory Visit: Payer: Medicaid Other | Admitting: Occupational Therapy

## 2011-05-02 ENCOUNTER — Ambulatory Visit: Payer: Medicaid Other | Admitting: Occupational Therapy

## 2011-05-07 ENCOUNTER — Ambulatory Visit: Payer: Medicaid Other | Admitting: Occupational Therapy

## 2011-05-09 ENCOUNTER — Encounter: Payer: Self-pay | Admitting: *Deleted

## 2011-05-14 ENCOUNTER — Ambulatory Visit: Payer: Medicaid Other | Admitting: Physical Therapy

## 2011-05-14 ENCOUNTER — Ambulatory Visit: Payer: Medicaid Other | Admitting: Occupational Therapy

## 2011-05-16 ENCOUNTER — Ambulatory Visit: Payer: Medicaid Other | Admitting: Occupational Therapy

## 2011-05-16 ENCOUNTER — Encounter: Payer: Self-pay | Admitting: *Deleted

## 2011-05-20 ENCOUNTER — Ambulatory Visit: Payer: Medicaid Other | Attending: Neurology | Admitting: Occupational Therapy

## 2011-05-20 ENCOUNTER — Encounter: Payer: Self-pay | Admitting: *Deleted

## 2011-05-20 DIAGNOSIS — Z5189 Encounter for other specified aftercare: Secondary | ICD-10-CM | POA: Insufficient documentation

## 2011-05-20 DIAGNOSIS — M6281 Muscle weakness (generalized): Secondary | ICD-10-CM | POA: Insufficient documentation

## 2011-05-20 DIAGNOSIS — I69991 Dysphagia following unspecified cerebrovascular disease: Secondary | ICD-10-CM | POA: Insufficient documentation

## 2011-05-20 DIAGNOSIS — I69998 Other sequelae following unspecified cerebrovascular disease: Secondary | ICD-10-CM | POA: Insufficient documentation

## 2011-05-20 DIAGNOSIS — R1310 Dysphagia, unspecified: Secondary | ICD-10-CM | POA: Insufficient documentation

## 2011-05-20 DIAGNOSIS — I69922 Dysarthria following unspecified cerebrovascular disease: Secondary | ICD-10-CM | POA: Insufficient documentation

## 2011-05-20 DIAGNOSIS — R269 Unspecified abnormalities of gait and mobility: Secondary | ICD-10-CM | POA: Insufficient documentation

## 2011-05-20 DIAGNOSIS — R5381 Other malaise: Secondary | ICD-10-CM | POA: Insufficient documentation

## 2011-05-20 DIAGNOSIS — R279 Unspecified lack of coordination: Secondary | ICD-10-CM | POA: Insufficient documentation

## 2011-05-23 ENCOUNTER — Telehealth: Payer: Self-pay | Admitting: *Deleted

## 2011-05-23 NOTE — Telephone Encounter (Signed)
States her bp is up today 137/106. Past bps have being good when seen in office.  She is taking all 3 bp meds. Asked if she can take a whole tab of the atenolol instead of a half. Told her not to change meds without a doctor ok. Asked her to come in tomorrow am for a BP check. She agreed, I reviewed a low salt diet with her. She has not been following one.

## 2011-05-24 ENCOUNTER — Ambulatory Visit: Payer: Medicaid Other

## 2011-05-24 ENCOUNTER — Encounter: Payer: Self-pay | Admitting: *Deleted

## 2011-05-24 ENCOUNTER — Ambulatory Visit: Payer: Medicaid Other | Admitting: Occupational Therapy

## 2011-05-24 NOTE — Patient Instructions (Signed)
States her bp at PT this am was 128/82 & at home this am was 137/94. Advised her to continue meds as prescribed. Reviewed low salt diet again. Told her BP machines may differ in accuracy & that bp changes with emotions, activity, etc. Told her if she gets a bad HA, blurry vision or dizziness, call to be seen. May use tylenol for HA. Keep fluid intake up. She verbalized understanding

## 2011-05-27 ENCOUNTER — Other Ambulatory Visit: Payer: Self-pay | Admitting: *Deleted

## 2011-05-28 ENCOUNTER — Ambulatory Visit: Payer: Medicaid Other | Admitting: Occupational Therapy

## 2011-05-28 ENCOUNTER — Other Ambulatory Visit: Payer: Self-pay | Admitting: *Deleted

## 2011-05-28 DIAGNOSIS — B009 Herpesviral infection, unspecified: Secondary | ICD-10-CM

## 2011-05-28 MED ORDER — VALACYCLOVIR HCL 1 G PO TABS: 1000.0000 mg | ORAL_TABLET | Freq: Two times a day (BID) | ORAL | Status: AC

## 2011-05-28 MED ORDER — VALACYCLOVIR HCL 1 G PO TABS: 1000.0000 mg | ORAL_TABLET | Freq: Two times a day (BID) | ORAL | Status: DC

## 2011-05-31 ENCOUNTER — Ambulatory Visit: Payer: Medicaid Other | Admitting: Occupational Therapy

## 2011-05-31 ENCOUNTER — Inpatient Hospital Stay (HOSPITAL_COMMUNITY)
Admission: EM | Admit: 2011-05-31 | Discharge: 2011-06-02 | DRG: 864 | Disposition: A | Discharge status: Home / Self Care | Payer: Medicaid Other | Attending: Internal Medicine | Admitting: Internal Medicine

## 2011-05-31 DIAGNOSIS — R509 Fever, unspecified: Principal | ICD-10-CM | POA: Diagnosis present

## 2011-05-31 DIAGNOSIS — R209 Unspecified disturbances of skin sensation: Secondary | ICD-10-CM

## 2011-05-31 DIAGNOSIS — E785 Hyperlipidemia, unspecified: Secondary | ICD-10-CM | POA: Diagnosis present

## 2011-05-31 DIAGNOSIS — I951 Orthostatic hypotension: Secondary | ICD-10-CM | POA: Diagnosis present

## 2011-05-31 DIAGNOSIS — Z21 Asymptomatic human immunodeficiency virus [HIV] infection status: Secondary | ICD-10-CM | POA: Diagnosis present

## 2011-05-31 DIAGNOSIS — R51 Headache: Secondary | ICD-10-CM | POA: Diagnosis present

## 2011-05-31 DIAGNOSIS — I69992 Facial weakness following unspecified cerebrovascular disease: Secondary | ICD-10-CM

## 2011-05-31 DIAGNOSIS — E860 Dehydration: Secondary | ICD-10-CM | POA: Diagnosis present

## 2011-05-31 DIAGNOSIS — E876 Hypokalemia: Secondary | ICD-10-CM | POA: Diagnosis present

## 2011-05-31 DIAGNOSIS — I69998 Other sequelae following unspecified cerebrovascular disease: Secondary | ICD-10-CM

## 2011-05-31 DIAGNOSIS — I10 Essential (primary) hypertension: Secondary | ICD-10-CM | POA: Diagnosis present

## 2011-05-31 DIAGNOSIS — J329 Chronic sinusitis, unspecified: Secondary | ICD-10-CM | POA: Diagnosis present

## 2011-06-01 ENCOUNTER — Emergency Department (HOSPITAL_COMMUNITY): Payer: Medicaid Other

## 2011-06-01 ENCOUNTER — Encounter: Payer: Self-pay | Admitting: Internal Medicine

## 2011-06-01 DIAGNOSIS — R51 Headache: Secondary | ICD-10-CM

## 2011-06-01 DIAGNOSIS — R509 Fever, unspecified: Secondary | ICD-10-CM

## 2011-06-01 LAB — ETHANOL: Alcohol, Ethyl (B): <11 mg/dL (ref 0–11)

## 2011-06-01 LAB — DIFFERENTIAL
Basophils Relative: 0 % (ref 0–1)
Eosinophils Absolute: 0 10*3/uL (ref 0.0–0.7)
Lymphs Abs: 1.5 10*3/uL (ref 0.7–4.0)
Monocytes Relative: 11 % (ref 3–12)
Neutro Abs: 5.3 10*3/uL (ref 1.7–7.7)
Neutrophils Relative %: 69 % (ref 43–77)

## 2011-06-01 LAB — URINALYSIS, MICROSCOPIC ONLY
Bilirubin Urine: NEGATIVE
Ketones, ur: NEGATIVE mg/dL
Nitrite: NEGATIVE
Protein, ur: NEGATIVE mg/dL
pH: 6 (ref 5.0–8.0)

## 2011-06-01 LAB — URINALYSIS, ROUTINE W REFLEX MICROSCOPIC
Hgb urine dipstick: NEGATIVE
Nitrite: POSITIVE — AB
Protein, ur: NEGATIVE mg/dL
Specific Gravity, Urine: >1.03 — ABNORMAL HIGH (ref 1.005–1.030)
Urobilinogen, UA: 0.2 mg/dL (ref 0.0–1.0)

## 2011-06-01 LAB — GRAM STAIN

## 2011-06-01 LAB — CBC
Hemoglobin: 12.7 g/dL (ref 12.0–15.0)
MCH: 30 pg (ref 26.0–34.0)
MCH: 30.4 pg (ref 26.0–34.0)
MCV: 88.7 fL (ref 78.0–100.0)
MCV: 89.4 fL (ref 78.0–100.0)
Platelets: 134 10*3/uL — ABNORMAL LOW (ref 150–400)
Platelets: 164 10*3/uL (ref 150–400)
RBC: 4.24 MIL/uL (ref 3.87–5.11)
RDW: 13.9 % (ref 11.5–15.5)
WBC: 7.3 10*3/uL (ref 4.0–10.5)
WBC: 7.8 10*3/uL (ref 4.0–10.5)

## 2011-06-01 LAB — URINE MICROSCOPIC-ADD ON

## 2011-06-01 LAB — LACTIC ACID, PLASMA: Lactic Acid, Venous: 1 mmol/L (ref 0.5–2.2)

## 2011-06-01 LAB — CSF CELL COUNT WITH DIFFERENTIAL
RBC Count, CSF: 0 /mm3
Tube #: 4
WBC, CSF: 1 /mm3 (ref 0–5)

## 2011-06-01 LAB — CRYPTOCOCCAL ANTIGEN, CSF: Crypto Ag: NEGATIVE

## 2011-06-01 LAB — RAPID URINE DRUG SCREEN, HOSP PERFORMED
Amphetamines: NOT DETECTED
Benzodiazepines: NOT DETECTED
Tetrahydrocannabinol: NOT DETECTED

## 2011-06-01 LAB — BASIC METABOLIC PANEL
CO2: 25 mEq/L (ref 19–32)
Chloride: 100 mEq/L (ref 96–112)
Glucose, Bld: 119 mg/dL — ABNORMAL HIGH (ref 70–99)
Sodium: 135 mEq/L (ref 135–145)

## 2011-06-01 LAB — PROTEIN AND GLUCOSE, CSF
Glucose, CSF: 66 mg/dL (ref 43–76)
Total  Protein, CSF: 26 mg/dL (ref 15–45)

## 2011-06-01 LAB — PROCALCITONIN: Procalcitonin: <0.1 ng/mL

## 2011-06-01 LAB — TSH: TSH: 0.661 u[IU]/mL (ref 0.350–4.500)

## 2011-06-01 NOTE — Progress Notes (Signed)
Hospital Admission Note Date: 06/01/2011  Patient name: Abigail Medina Medical record number: [id number] Date of birth: 1966-11-05 Age: 45 y.o. Gender: female PCP: Carolin Guernsey, NP, NP  Medical Service: IMTS  Attending physician:  Dr. Anderson Malta              Pager: Resident [phone]): Dr. Eben Burow    Pager: Resident (R1): Dr. 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 Patient states that she was in her usual state of health until earlier today when she started complaining of a left sided headache which she described as throbbing. The headache was associated with a fever to 102.9 and chills, however there was no associated nausea, vomiting, neck pain, abd pain, dizziness/lightheadedness, chest pain, sob, palpitations, dysuria or abd pain. However, she states she had a mild non productive cough for the past 2 weeks which was now resolved. She states that she feels like she has a sinus infection. Of note, her daughter checked her bp at home and found it to be low in the 80s/50s, after which she was subsequently brought into the ED. She denies being around any sick contacts or recent travel.  ED course is notable for concerns of meningitis given fever and headache. Fluoro guided LP was conducted and preliminary results have been normal. Workup to eval fever by CXR, CT and UA have also been non revealing. However, she was started on Vanc + Rocephin. There was no documented fever in the ED.  Past Medical History: HIV on ARV (last CD4 count 200 in May 2012, viral load <20 in March 2012) HTN HL R. MCA infarct in Feb 2012   Current Outpatient Prescriptions  Medication Sig Dispense Refill  . atenolol  (TENORMIN) 100 MG tablet Take 0.5 tablets (50 mg total) by mouth daily.  15 tablet  2  . emtricitabine-tenofovir (TRUVADA) 200-300 MG per tablet Take 1 tablet by mouth daily.  30 tablet  6  . hydrochlorothiazide 25 MG tablet Take 25 mg by mouth daily.        . hydrocortisone 1 % lotion Apply topically 2 (two) times daily. Use sparingly (May use OTC cream)  120 mL  0  . lisinopril (PRINIVIL,ZESTRIL) 20 MG tablet TAKE ONE TABLET BY MOUTH DAILY  30 tablet  3  . loratadine (CLARITIN) 10 MG tablet Take 10 mg by mouth daily.        . metroNIDAZOLE (FLAGYL) 500 MG tablet Take 500 mg by mouth 2 (two) times daily. For 7 days       . metroNIDAZOLE (METROGEL) 0.75 % vaginal gel Place 1 Applicatorful vaginally 2 (two) times daily.        Marland Kitchen nystatin (MYCOSTATIN) cream Apply topically 2 (two) times daily. Use sparingly.  30 g  1  . raltegravir (ISENTRESS) 400 MG tablet Take 400 mg by mouth 2 (two) times daily.        . simvastatin (ZOCOR) 20 MG tablet Take 20 mg by mouth at bedtime.        . sulfamethoxazole-trimethoprim (BACTRIM DS) 800-160 MG per tablet Take 1 tablet by mouth daily.        . valACYclovir (VALTREX) 1000 MG tablet Take 1 tablet (1,000  mg total) by mouth 2 (two) times daily.  21 tablet  0    Allergies: Review of patient's allergies indicates no known allergies.   History   Social History  . Marital Status: Single    Spouse Name: N/A    Number of Children: N/A  . Years of Education: N/A   Occupational History  . Not on file.   Social History Main Topics  . Smoking status: Never Smoker   . Smokeless tobacco: Never Used  . Alcohol Use: 1.0 - 1.5 oz/week    2-3 drink(s) per week  . Drug Use: No  . Sexually Active: Yes -- Female partner(s)    Birth Control/ Protection: Condom   Other Topics Concern  . Not on file   Social History Narrative  . No narrative on file    Review of Systems: Pertinent items are noted in HPI.  Physical Exam: Vitals  t  98.7, bp 88/58 (118/85), p  86-96, rr 17-19, 02 sat 98% RA General appearance: no distress and drowsy from pain meds Head: Normocephalic, without obvious abnormality, atraumatic Eyes: PERRL, EOMI Nose: Nares normal. Septum midline. Mucosa normal. No drainage or sinus tenderness. Throat: lips, mucosa, and tongue normal; teeth and gums normal Neck: no adenopathy, no carotid bruit, no JVD, supple, symmetrical, trachea midline and no meningeal signs Lungs: clear to auscultation bilaterally Heart: regular rate and rhythm, S1, S2 normal, no murmur, click, rub or gallop Abdomen: soft, non-tender; bowel sounds normal; no masses,  no organomegaly Extremities: extremities normal, atraumatic, no cyanosis or edema Pulses: 2+ and symmetric Skin: warm and dry, color normal, no rashes or petechiae Neurologic: left sided facial droop, decreased sensation on the left face, at baseline per patient and daughter, rest of the exam difficult to assess given reduced ability to perform commands given increased drowsiness from pain medications  Lab results: CBC:    Component Value Date/Time   WBC 7.8 06/01/2011 0027   HGB 12.7 06/01/2011 0027   HCT 37.6 06/01/2011 0027   PLT 164 06/01/2011 0027   MCV 88.7 06/01/2011 0027   NEUTROABS 5.3 06/01/2011 0027   LYMPHSABS 1.5 06/01/2011 0027   MONOABS 0.9 06/01/2011 0027   EOSABS 0.0 06/01/2011 0027   BASOSABS 0.0 06/01/2011 0027    Basic Metabolic Panel:    Component Value Date/Time   NA 135 06/01/2011 0027   K 3.1* 06/01/2011 0027   CL 100 06/01/2011 0027   CO2 25 06/01/2011 0027   BUN 11 06/01/2011 0027   CREATININE 0.85 06/01/2011 0027   CREATININE 0.76 04/18/2011 1115   GLUCOSE 119* 06/01/2011 0027   CALCIUM 8.9 06/01/2011 0027    Color, Urine                             AMBER      a      YELLOW    BIOCHEMICALS MAY BE AFFECTED BY COLOR  Appearance                               CLOUDY     a      CLEAR  Specific Gravity                         >1.030     h      1.005-1.030  pH  5.0               5.0-8.0  Urine Glucose                            NEGATIVE          NEG              mg/dL  Bilirubin                                MODERATE   a      NEG  Ketones                                  15         a      NEG              mg/dL  Blood                                    NEGATIVE          NEG  Protein                                  NEGATIVE          NEG              mg/dL  Urobilinogen                             0.2               0.0-1.0          mg/dL  Nitrite                                  POSITIVE   a      NEG  Leukocytes                               TRACE      a      NEG  Squamous Epithelial / LPF                MANY       a      RARE  Casts / HPF                              SEE NOTE.  a      NEG    HYALINE CASTS  WBC / HPF                                3-6               <3               WBC/hpf  RBC / HPF  0-2               <3               RBC/hpf  Bacteria / HPF                           RARE              RARE   Procalcitonin                            <0.10                              Ng/mL  CSF Analysis SPECIMEN OBTAINED:            06/01/2011 03:35  SPECIMEN DESCRIPTION:         CSF                                TUBE 3  SPECIAL REQUESTS:             NONE  GRAM SMEAR:                   CYTOSPIN SLIDE                                WBC PRESENT,BOTH PMN AND MONONUCLEAR                                NO ORGANISMS SEEN  Glucose, CSF                             66                43-76            mg/dL  Total  Protein, CSF                      26                15-45            mg/dL  Cell count and diff' Tube #                                   1  Color, CSF                               COLORLESS         COLR  Clarity                                  CLEAR             CLEAR  Supernatant                              SEE NOTE.    NOT INDICATED  WBC Count-CSF  1                  0-5              /cu mm  RBC Count                                1          h      0                /cu mm  Segmented Neutrophils-CSF                RARE              0-6              %    TOO FEW TO COUNT, SMEAR AVAILABLE FOR REVIEW  Lymphocytes-CSF                          FEW               40-80            %  Monocyte-Macrophage-Spinal Fluid         RARE              15-45            %  Tube #                                   4  Color, CSF                               COLORLESS         COLR  Clarity                                  CLEAR             CLEAR  Supernatant                              SEE NOTE.    NOT INDICATED  WBC Count-CSF                            1                 0-5              /cu mm  RBC Count                                0                 0                /cu mm  Segmented Neutrophils-CSF                RARE              0-6              %  TOO FEW TO COUNT, SMEAR AVAILABLE FOR REVIEW  Lymphocytes-CSF                          RARE              40-80            %  Monocyte-Macrophage-Spinal Fluid         RARE              15-45            %   Imaging results:  CHEST - 2 VIEW    Comparison: 01/21/2011    Findings: Heart size is within normal limits.  Ectasia of the   thoracic aorta is stable.  Both lungs are clear.  No evidence of   pleural effusion. No mass or adenopathy identified.    IMPRESSION:   Stable exam.  No active disease.  CT HEAD WITHOUT CONTRAST    Technique:  Contiguous axial images were obtained from the base of   the skull through the vertex without contrast.    Comparison: 01/25/2011    Findings: Chronic infarct right frontal cortex.  No acute infarct.   Negative for hemorrhage or mass lesion.  Ventricle size is slightly   prominent but unchanged from prior study.  Chronic infarct right   cerebellum is unchanged.    Mild chronic sinusitis.    IMPRESSION:   Chronic infarcts right frontal lobe and right cerebellum.   No acute   abnormality.  Assessment & Plan by Problem:  1. Fever/headache - unclear etiology. No fever documented in the ED thus far. Clinically and hemodynamically stable currently. 02 sats wnl on RA. Workup thus far for fever has potentially ruled out meningitis, pneumonia, CT head showing mild chronic sinusitis. Rheumatologic workup with ANA and ESR negative in Feb 2012. UA dirty, however asymptomatic. Hypotension resolved after IVF, however appears to be clinically dry. Will admit for observation over the next 24 hours and for IVF administration. - Blood and urine cultures obtained in the ED. Will follow. - Conservative management with IVFs- 1L bolus now and cont NS @ 100cc/hr - Tylenol and Toradol prn - consider starting broad spectrum antibiotics if she spikes a fever   2. Hypokalemia - likely 2/2 dehydration and poor po intake - replete orally for now. Check Mg if she doesn't respond appropriately.  3. HIV - continue Isentress and Truvada - f/u CD4 count. Consider checking a viral load  4. HTN - hold all antihypertensives for now  5. H/O CVA - continue ASA and statin - Lipids wnl in Feb 2012  6. HL - continue statin  7. DVT ppx - Heparin

## 2011-06-03 ENCOUNTER — Telehealth: Payer: Self-pay | Admitting: *Deleted

## 2011-06-03 LAB — HERPES SIMPLEX VIRUS(HSV) DNA BY PCR
HSV 1 DNA: NOT DETECTED
HSV 2 DNA: NOT DETECTED

## 2011-06-03 LAB — T-HELPER CELLS (CD4) COUNT (NOT AT ARMC): CD4 % Helper T Cell: 15 % — ABNORMAL LOW (ref 33–55)

## 2011-06-03 NOTE — Telephone Encounter (Signed)
Patient called adv she was just released from the hospital and needs a follow-up. She said her BP was low and that she has some kind of infection. Adv her would make a not and transferred her to the front desk to make her appt.

## 2011-06-04 ENCOUNTER — Encounter: Payer: Medicaid Other | Admitting: Occupational Therapy

## 2011-06-04 ENCOUNTER — Encounter: Payer: Self-pay | Admitting: Adult Health

## 2011-06-04 ENCOUNTER — Ambulatory Visit (INDEPENDENT_AMBULATORY_CARE_PROVIDER_SITE_OTHER): Payer: Medicaid Other | Admitting: Adult Health

## 2011-06-04 VITALS — BP 117/90 | HR 73 | Temp 97.9°F | Ht 63.5 in | Wt 183.4 lb

## 2011-06-04 DIAGNOSIS — B20 Human immunodeficiency virus [HIV] disease: Secondary | ICD-10-CM

## 2011-06-04 LAB — URINE CULTURE

## 2011-06-04 LAB — CSF CULTURE W GRAM STAIN

## 2011-06-04 NOTE — Progress Notes (Signed)
  Subjective:    Patient ID: Abigail Medina, female    DOB: 24-Dec-1965, 45 y.o.   MRN: [id number]  HPI Presents to clinic for followup posthospitalization for which he was admitted for "being sick." States had fevers with chills, and sweats, and generalized malaise, and not exertional fatigue, along with headaches and neck stiffness. Hospital. Workup revealed only an active UTI for which she was treated during the hospitalization. Currently, she states she is feeling better. However, she is complaining of sinus congestion, and burning in the nasal passages.   Review of Systems  Constitutional: Negative.   HENT: Positive for congestion, rhinorrhea, postnasal drip and sinus pressure. Negative for sneezing.   Eyes: Negative.   Respiratory: Negative.   Cardiovascular: Negative.   Gastrointestinal: Negative.   Genitourinary: Negative.   Musculoskeletal: Negative.   Skin: Negative.   Neurological: Negative.   Psychiatric/Behavioral: Negative.        Objective:   Physical Exam  Constitutional: She is oriented to person, place, and time. She appears well-developed and well-nourished. No distress.  HENT:  Head: Normocephalic and atraumatic.  Right Ear: External ear normal.  Left Ear: External ear normal.  Mouth/Throat: Oropharynx is clear and moist.       Nasal passages appeared edematous, but dry. Slightly inflamed  Eyes: Conjunctivae and EOM are normal. Pupils are equal, round, and reactive to light.  Neck: Normal range of motion. Neck supple. No thyromegaly present.  Cardiovascular: Normal rate, regular rhythm and intact distal pulses.   Pulmonary/Chest: Effort normal and breath sounds normal.  Abdominal: Soft. Bowel sounds are normal.  Neurological: She is alert and oriented to person, place, and time. No cranial nerve deficit. She exhibits normal muscle tone. Coordination normal.  Skin: Skin is warm and dry. No rash noted.  Psychiatric: She has a normal mood and affect. Her behavior is  normal. Judgment and thought content normal.          Assessment & Plan:  1. Status post hospitalization. No discharge summary was available, either in the E-Chart or in Epic. Based on laboratory data, it appears that she may have had a bad UTI, which has recovered as her repeat urinalysis demonstrated a normal result. We will continue to monitor this especially if symptoms redevelop.  2. HIV. Scheduled for repeat staging labs, with a lipid profile on 06/27/2011, with a followup 2 weeks following the blood draw.  Verbally acknowledged all information was provided to her and agreed with plan of care.

## 2011-06-06 ENCOUNTER — Ambulatory Visit: Payer: Medicaid Other | Admitting: Occupational Therapy

## 2011-06-07 LAB — CULTURE, BLOOD (ROUTINE X 2)
Culture  Setup Time: 201206161032
Culture: NO GROWTH

## 2011-06-11 ENCOUNTER — Ambulatory Visit: Payer: Medicaid Other | Admitting: Occupational Therapy

## 2011-06-11 ENCOUNTER — Encounter: Payer: Self-pay | Admitting: *Deleted

## 2011-06-13 ENCOUNTER — Encounter: Payer: Medicaid Other | Admitting: Occupational Therapy

## 2011-06-17 ENCOUNTER — Encounter: Payer: Medicaid Other | Admitting: Occupational Therapy

## 2011-06-19 NOTE — Discharge Summary (Signed)
Abigail Medina, SAKO NO.:  [id number]  MEDICAL RECORD NO.:  [id number]  LOCATION:  6714                         FACILITY:  [facility]  PHYSICIAN:  Blanch Media, M.D.DATE OF BIRTH:  [birth year]  DATE OF ADMISSION:  05/31/2011 DATE OF DISCHARGE:  06/02/2011                              DISCHARGE SUMMARY   DISCHARGE DIAGNOSES: 1. Fever and headache, unclear etiology.  Meningitis ruled out with     unrevealing spinal tap.  No fever for 24 hours during admission. 2. Human immunodeficiency virus with CD-4 count 200 at admission. 3. Hypokalemia, likely secondary to dehydration and poor p.o. intake,     replaced before discharge. 4. Hypertension. 5. History of cerebrovascular accident, on aspirin and statin. 6. Hyperlipidemia, on statin. 7. Stroke with right middle cerebral artery infarct in February 2012.  DISCHARGE MEDICATIONS: 1. Bactrim double strength 1 tablet daily by mouth. 2. Atenolol 100 mg 1 tablet p.o. daily every morning. 3. Aspirin 325 mg 1 tablet p.o. daily. 4. Isentress that is raltegravir 400 mg 1 tablet p.o. b.i.d. 5. Simvastatin 20 mg 1 tablet p.o. daily. 6. Truvada 1 tablet p.o. daily 200 x 300 mg.  DISPOSITION AND FOLLOWUP:  [name] is to be discharged in a relatively stable condition.  She is to be followed up at [facility] in 1-2 weeks.  She is also be followed up in the ID Clinic with Talmadge Chad, NP.  CONSULTATIONS:  None.  PROCEDURES PERFORMED: 1. Chest x-ray 2 view on June 01, 2011, impression, stable exam, no     active disease. 2. CT head without contrast media, June 01, 2011, impression, chronic     infarcts in right frontal lobe and right cerebellum.  No acute     abnormality. 3. Lumbar puncture, fluoroscopic guided, impression, successful lumbar     puncture with clear CSF obtained on June 01, 2011, and the CSF     studies showing no signs of infection.  Also, blood culture and CSF     culture final  negative, showing no growth.  CHIEF COMPLAINT:  Fever, headache, and hypotension.  HISTORY OF PRESENT ILLNESS:  The patient in 45 year old woman with HIV, CD-4 200 in May 2012, hypertension, hyperlipidemia, and recent right MCA stroke with residual left-sided facial droop and numbness in February 2012, presenting with history of fever to 102.9, headache, and hypertension, this started 38 a day.  The patient states that she was in her usual state of health until earlier today when she started complaining of a left-sided headache which she described as throbbing. The headache was associated with a fever of 102.9 and chills, however, there was no associated nausea, vomiting, neck pain, abdominal pain, dizziness/lightheadedness, chest pain, shortness of breath, palpitations, dysuria, or abdominal pain.  However, she states she had a mild nonproductive cough for the past 2 weeks which was not resolved. She states that she feels like she had a sinus infection.  Of note, her daughter checked her blood pressure at home and found it to be low in the 80s/50s.  After which, she was subsequently brought into the ED. She denies being around any sick contacts or recent travels.  ED  course is notable for concerns of meningitis given fever and headache. Fluoroscopic-guided lumbar puncture was conducted and preliminary results have been normal.  Workup to evaluate fever by chest x-ray, CT, and UA have also been nonrevealing.  However, she was started on vancomycin plus Rocephin.  There was no documented fever in the ED.  PHYSICAL EXAMINATION:  VITAL SIGNS:  On admission, temperature 98.7, blood pressure 88/58 which improved to 118/85, pulse 86-96, respirations 17-19, O2 sats 98% on room air. GENERAL APPEARANCE:  No distress and drowsy from pain meds. HEENT:  Head normocephalic without obvious abnormality, atraumatic. Eyes, PERRLA.  EOMI.  Nose, nares normal.  Septum midline.  Mucosa normal.  No  drainage or sinus tenderness.  Throat, lips, mucosa, and tongue normal.  Teeth and gums normal. NECK:  No adenopathy.  No carotid bruit.  No JVD.  Supple, symmetrical. Trachea midline.  No meningeal signs. LUNGS:  Clear to auscultation bilaterally. HEART:  Regular rate and rhythm.  S1 and S2 normal.  No murmur, click, rub, or gallop. ABDOMEN:  Soft, nontender.  Bowel sounds normal.  No masses, no organomegaly. EXTREMITIES:  Normal, atraumatic.  No cyanosis or edema.  Pulses are 2+ and symmetric. SKIN:  Warm and dry.  Normal.  No rashes or petechiae. NEUROLOGIC:  Left-sided facial droop.  Decreased sensation on the left face at baseline per the patient and daughter, rest of the exam difficult to assess given her reduced ability to perform commands, given increased drowsiness from pain medications.  LABS ON ADMISSION:  CBC; WBC 7.8, hemoglobin 12.7, hematocrit 37.6, platelets 164, absolute neutrophil 5.3.  BMET; sodium 135, potassium 3.1, chloride 100, bicarb 25, BUN 11, creatinine 0.85, glucose 119, calcium 8.9.  UA showing moderate bilirubin with cloudy urine and positive nitrites with trace leukocytes.  Urine micro showing many squamous epithelial cells but rare bacteria and 3-6 wbc's. Procalcitonin less than 0.1.  CSF analysis, Gram smear showing no organisms, cell count in differential showing 1 wbc, 1 rbc, and few lymphocytes.  HOSPITAL COURSE: 1. Fever and headache.  The patient complained of fever of 102.9 at     home along with headache.  Lumbar puncture was performed in the ED     to rule out meningitis because the patient is HIV and had last CD-4     count was 200 and so is at high risk of having meningitis versus     encephalitis.  So, lumbar puncture was unrevealing for any signs of     infection as described above.  Also, final cultures from CSF showed     no growth for any organism.  Also, blood culture was drawn in the     ED, final results showed no growth.  Her  urine sample which was     collected was likely contaminated and which showed 100,000 colonies     of MRSA, but she was asymptomatic and was likely not a clean urine     sample.  Also during the whole hospital course, her temperature     never spiked up to a fever range.  Her headache was improving.     Also, HSV DNA was checked and CSF which was not detected, both HSV     1 and 2 were not detecting, so she was ruled out for serious causes     of her headache and fever.  Also, not able to find any serious     causes.  She was discharged home in relatively stable condition  with a follow up in a week or two with a primary care and with     followup appointment which would be set up by the patient with ID     Clinic. 2. HIV, her recent CD-4 count in May 2012 was 200 and during the     admission also was 200.  It was unchanged.  She was continued on     her HIV medication during the hospital course and was discharged on     the same.  She was discharged on Bactrim prophylaxis double     strength 1 tablet daily.  She is to be followed up with ID Clinic     and she will make an appointment with them. 3. Hypokalemia.  The patient was found to have potassium of 3.1 during     admission and was likely due to dehydration and poor p.o. intake.     Her potassium was replaced appropriately before discharge. 4. Hypertension.  All the blood pressure medications were held during     the hospital course because of hypotension and she was sent home on     atenolol 100 mg p.o. daily once her blood pressure was stabilized     during the hospital course. 5. History of CVA recently in February 2012.  She was continued on     aspirin and statin and was discharged on the same home.  DISCHARGE LABS:  CBC; WBC 7.3, hemoglobin 11.2, hematocrit 32.9, platelets 134.  TSH 0.661.  Cryptococcal antigen negative and CSF, HSV 1 and 2 DNA not detected in CSF.  DISCHARGE VITALS:  Temperature 97.7, pulse 75,  respirations 18, blood pressure 134/94, O2 sat 100% on room air.  She is to be discharged in relatively stable condition.    ______________________________ Lyn Hollingshead, MD   ______________________________ Blanch Media, M.D.    RP/MEDQ  D:  06/17/2011  T:  06/18/2011  Job:  [id number]  cc:   Talmadge Chad, NP  Electronically Signed by Lyn Hollingshead MD on 06/18/2011 02:26:57 PM Electronically Signed by Blanch Media M.D. on 06/19/2011 02:13:05 PM

## 2011-06-20 ENCOUNTER — Encounter: Payer: Medicaid Other | Admitting: Occupational Therapy

## 2011-06-24 ENCOUNTER — Other Ambulatory Visit: Payer: Self-pay | Admitting: Adult Health

## 2011-06-24 ENCOUNTER — Ambulatory Visit (INDEPENDENT_AMBULATORY_CARE_PROVIDER_SITE_OTHER): Payer: Medicaid Other | Admitting: Family Medicine

## 2011-06-24 ENCOUNTER — Encounter: Payer: Self-pay | Admitting: Family Medicine

## 2011-06-24 ENCOUNTER — Other Ambulatory Visit (HOSPITAL_COMMUNITY)
Admission: RE | Admit: 2011-06-24 | Discharge: 2011-06-24 | Disposition: A | Discharge status: Home / Self Care | Payer: Medicaid Other | Source: Ambulatory Visit | Attending: Family Medicine | Admitting: Family Medicine

## 2011-06-24 ENCOUNTER — Other Ambulatory Visit: Payer: Self-pay | Admitting: Family Medicine

## 2011-06-24 VITALS — BP 107/81 | HR 69 | Temp 97.2°F | Ht 64.0 in | Wt 181.9 lb

## 2011-06-24 DIAGNOSIS — N72 Inflammatory disease of cervix uteri: Secondary | ICD-10-CM | POA: Insufficient documentation

## 2011-06-24 DIAGNOSIS — N870 Mild cervical dysplasia: Secondary | ICD-10-CM | POA: Insufficient documentation

## 2011-06-24 DIAGNOSIS — R87810 Cervical high risk human papillomavirus (HPV) DNA test positive: Secondary | ICD-10-CM

## 2011-06-24 DIAGNOSIS — I10 Essential (primary) hypertension: Secondary | ICD-10-CM

## 2011-06-24 LAB — POCT PREGNANCY, URINE: Preg Test, Ur: NEGATIVE

## 2011-06-24 NOTE — Progress Notes (Signed)
Negative urine pregnancy test.

## 2011-06-24 NOTE — Progress Notes (Signed)
  Colposcopy Procedure Note  Indications: this is a 45 yo HIV pov [id number] s/p TSVD x6 presenting with recurrent abnormal pap smear.  Pap smear 3 months ago showed: ASCUS with POSITIVE high risk HPV. The prior pap showed atypical squamous cellularity of undetermined significance (ASCUS).  Prior cervical/vaginal disease: HPV related changes. Prior cervical treatment: numerous colposcopies and pt denies any history of LEEP or cone or surgeries. UPT neg. H/o irregular menses currently every 3 mo. LMP 06/13/2011  Procedure Details  The risks and benefits of the procedure and Written informed consent obtained.  Speculum placed in vagina and excellent visualization of cervix achieved, cervix swabbed x 3 with acetic acid solution.  Findings: Cervix: HPV changes noted at 9 o'clock and 1 o'clock; cervix swabbed with Lugol's solution, endocervical curettage performed and cervical biopsies taken at 9 and 1 o'clock. Vaginal inspection: normal without visible lesions. Vulvar colposcopy: vulvar colposcopy not performed.  Specimens: cervical specimens at 9 o'clock and 1 o'clock.  ECC performed  Complications: none-minimal EBL-monsel's applied to the cervix.  Plan: Specimens labelled and sent to Pathology. Post biopsy instructions given to patient. Return to discuss Pathology results in 2 weeks.

## 2011-06-24 NOTE — Patient Instructions (Signed)
  Colposcopy Care After Colposcopy is a procedure in which a special tool is used to magnify the surface of the cervix. A tissue sample (biopsy) may also be taken. This sample will be used to detect cancer of the cervix or other problems. These procedures may be done after a pelvic exam shows a possible problem.  AFTER THE TEST  You may have some cramping.   Lie down for a few minutes if you feel light headed.   You may have some bleeding which should stop in a few days.  HOME CARE  Do not have sex or use tampons for 2-3 days or as directed.   Only take medicine as directed by your doctor.   If you are on birth control pills, continue to take your pills the way you normally do.   To protect your privacy, test results cannot be given over the phone. Make sure you get the results of your test. Ask how you will get the results if you have not been told. Do not assume everything is normal if you have not heard from your doctor. Follow up on all of your test results.  GET HELP RIGHT AWAY IF YOU:  Are bleeding heavily or passing blood clots.   Develop a fever of 102F (38.9C) or higher.   Have abnormal vaginal discharge.   Have cramps that do not go away after medicine.   Feel light headed, dizzy or faint.  MAKE SURE YOU:   Understand these instructions.   Will watch your condition.   Will get help right away if you are not doing well or get worse.  Return to clinic in 2 weeks for results and follow up with surgeon to discuss options.  Document Released: 05/20/2008 Document Re-Released: 09/29/2009 [facility] Patient Information 2011 [place], [place].

## 2011-06-25 ENCOUNTER — Encounter: Payer: Medicaid Other | Admitting: Occupational Therapy

## 2011-06-27 ENCOUNTER — Other Ambulatory Visit: Payer: Medicaid Other

## 2011-06-27 ENCOUNTER — Encounter: Payer: Medicaid Other | Admitting: Occupational Therapy

## 2011-07-01 ENCOUNTER — Other Ambulatory Visit: Payer: Medicaid Other

## 2011-07-01 ENCOUNTER — Other Ambulatory Visit: Payer: Self-pay | Admitting: Infectious Diseases

## 2011-07-01 DIAGNOSIS — Z79899 Other long term (current) drug therapy: Secondary | ICD-10-CM

## 2011-07-01 DIAGNOSIS — B20 Human immunodeficiency virus [HIV] disease: Secondary | ICD-10-CM

## 2011-07-01 LAB — FUNGUS CULTURE W SMEAR

## 2011-07-02 LAB — COMPREHENSIVE METABOLIC PANEL
ALT: 28 U/L (ref 0–35)
AST: 27 U/L (ref 0–37)
Calcium: 9.1 mg/dL (ref 8.4–10.5)
Chloride: 105 mEq/L (ref 96–112)
Creat: 0.94 mg/dL (ref 0.50–1.10)
Potassium: 4.2 mEq/L (ref 3.5–5.3)
Sodium: 139 mEq/L (ref 135–145)
Total Protein: 7.4 g/dL (ref 6.0–8.3)

## 2011-07-02 LAB — CBC WITH DIFFERENTIAL/PLATELET
Basophils Absolute: 0 10*3/uL (ref 0.0–0.1)
Eosinophils Relative: 5 % (ref 0–5)
Lymphocytes Relative: 50 % — ABNORMAL HIGH (ref 12–46)
Neutro Abs: 1 10*3/uL — ABNORMAL LOW (ref 1.7–7.7)
Neutrophils Relative %: 36 % — ABNORMAL LOW (ref 43–77)
Platelets: 151 10*3/uL (ref 150–400)
RBC: 4.29 MIL/uL (ref 3.87–5.11)
RDW: 13.7 % (ref 11.5–15.5)
WBC: 2.7 10*3/uL — ABNORMAL LOW (ref 4.0–10.5)

## 2011-07-02 LAB — LIPID PANEL
HDL: 51 mg/dL (ref >39)
LDL Cholesterol: 73 mg/dL (ref 0–99)
Triglycerides: 74 mg/dL (ref <150)
VLDL: 15 mg/dL (ref 0–40)

## 2011-07-11 ENCOUNTER — Ambulatory Visit: Payer: Medicaid Other | Admitting: Adult Health

## 2011-07-14 LAB — AFB CULTURE WITH SMEAR (NOT AT ARMC): Acid Fast Smear: NONE SEEN

## 2011-07-15 ENCOUNTER — Ambulatory Visit (INDEPENDENT_AMBULATORY_CARE_PROVIDER_SITE_OTHER): Payer: Medicaid Other | Admitting: Adult Health

## 2011-07-15 ENCOUNTER — Encounter: Payer: Self-pay | Admitting: Adult Health

## 2011-07-15 DIAGNOSIS — F418 Other specified anxiety disorders: Secondary | ICD-10-CM | POA: Insufficient documentation

## 2011-07-15 DIAGNOSIS — G4700 Insomnia, unspecified: Secondary | ICD-10-CM | POA: Insufficient documentation

## 2011-07-15 DIAGNOSIS — F341 Dysthymic disorder: Secondary | ICD-10-CM

## 2011-07-15 MED ORDER — ESCITALOPRAM OXALATE 10 MG PO TABS: ORAL_TABLET | ORAL | Status: DC

## 2011-07-15 MED ORDER — HYDROXYZINE PAMOATE 25 MG PO CAPS: ORAL_CAPSULE | ORAL | Status: DC

## 2011-07-15 NOTE — Progress Notes (Signed)
Subjective:    Patient ID: Abigail Medina, female    DOB: 1966/01/13, 45 y.o.   MRN: [id number]  HPI Presents to clinic for routine scheduled followup. Remains adherent to her antiretrovirals with good tolerance and no complications. Relates symptoms of weight gain, anhedonia, feeling sad, anxiety, insomnia, emotional lability, and low energy. Her significant other reports that these symptoms have been occurring since she had her CVA  several months ago. It has been within the past month where the symptoms have interfered with her ADL. Denies any auditory or visual hallucinations, suicidal or homicidal thoughts or plans.   Review of Systems  Constitutional: Positive for activity change, appetite change, fatigue and unexpected weight change.  HENT: Negative.   Eyes: Negative.   Respiratory: Negative.   Cardiovascular: Negative.   Gastrointestinal: Negative.   Genitourinary: Negative.   Musculoskeletal: Negative.   Neurological: Negative.   Psychiatric/Behavioral: Positive for sleep disturbance, decreased concentration and agitation. Negative for suicidal ideas, hallucinations, behavioral problems, confusion, self-injury and dysphoric mood. The patient is nervous/anxious. The patient is not hyperactive.        Objective:   Physical Exam  Constitutional: She is oriented to person, place, and time. She appears well-developed and well-nourished. No distress.  HENT:  Head: Normocephalic and atraumatic.  Right Ear: External ear normal.  Left Ear: External ear normal.  Mouth/Throat: Oropharynx is clear and moist.  Eyes: Conjunctivae and EOM are normal. Pupils are equal, round, and reactive to light.  Neck: Normal range of motion.  Cardiovascular: Normal rate, regular rhythm, normal heart sounds and intact distal pulses.   Pulmonary/Chest: Effort normal and breath sounds normal.  Abdominal: Soft.  Musculoskeletal: Normal range of motion.  Neurological: She is alert and oriented to person,  place, and time. No cranial nerve deficit. She exhibits normal muscle tone. Coordination normal.  Skin: Skin is warm and dry.  Psychiatric: Her mood appears not anxious. Her affect is blunt. Her affect is not angry, not labile and not inappropriate. Her speech is not rapid and/or pressured, not delayed, not tangential and not slurred. She is not agitated, not aggressive, is not hyperactive, not slowed, not withdrawn, not actively hallucinating and not combative. Thought content is not paranoid. Cognition and memory are not impaired. She does not express impulsivity or inappropriate judgment. She exhibits a depressed mood. She expresses no homicidal and no suicidal ideation. She expresses no suicidal plans and no homicidal plans. She is communicative. She exhibits normal recent memory and normal remote memory. She is attentive.          Assessment & Plan:  1. HIV. Labs obtained 07/01/2011. Show a CD4 count of 220 at 15% with a viral load less than 20 copies per mL. Clinically stable on current regimen. Recommend continuing present management, following up with her HIV in 4 months with repeat staging labs 2 weeks before her next appointment.  2. Depression with Anxiety. We will begin with Lexapro 10 mg by mouth daily x10 days, then, 20 mg by mouth daily thereafter. Also, we will try hydroxyzine 25 mg by mouth every 6 hours when necessary for anxiety, with 25 mg at bedtime for sleep. We will refer her to mental health counseling, which, a phone number was provided for her. We will ask that she return to clinic in 2 weeks for followup on how. She is doing with her medications.  Medication regimens, drug effects, side effects, potential adverse drug reactions, and toxicities were discussed in detail. She verbally acknowledged all information that  was provided to her and agreed with plan of care. No problem-specific assessment & plan notes found for this encounter.

## 2011-07-15 NOTE — Patient Instructions (Signed)
Depression You have signs of depression. This is a common problem. It can occur at any age. It is often hard to recognize. People can suffer from depression and still have moments of enjoyment. Depression interferes with your basic ability to function in life. It upsets your relationships, sleep, eating, and work habits. CAUSES Depression is believed to be caused by an imbalance in brain chemicals. It may be triggered by an unpleasant event. Relationship crises, a death in the family, financial worries, retirement, or other stressors are normal causes of depression. Depression may also start for no known reason. Other factors that may play a part include medical illnesses, some medicines, genetics, and alcohol or drug abuse. SYMPTOMS  Feeling unhappy or worthless.   Long-lasting (chronic) tiredness or worn-out feeling.   Self-destructive thoughts and actions.   Not being able to sleep or sleeping too much.   Eating more than usual or not eating at all.   Headaches or feeling anxious.   Trouble concentrating or making decisions.   Unexplained physical problems and substance abuse.  TREATMENT Depression usually gets better with treatment. This can include:  Antidepressant medicines. It can take weeks before the proper dose is achieved and benefits are reached.   Talking with a therapist, clergyperson, counselor, or friend. These people can help you gain insight into your problem and regain control of your life.   Eating a good diet.   Getting regular physical exercise, such as walking for 30 minutes every day.   Not abusing alcohol or drugs.  Treating depression often takes 6 months or longer. This length of treatment is needed to keep symptoms from returning. Call your caregiver and arrange for follow-up care as suggested. SEEK IMMEDIATE MEDICAL CARE IF:  You start to have thoughts of hurting yourself or others.   Call your local emergency services (911 in [place]   Go to your  local medical emergency department.   Call the National Suicide Prevention Lifeline: 1-800-273-TALK (206)440-1096).  Document Released: 12/02/2005 Document Re-Released: 05/22/2010 [employer] Patient Information 2011 [employer], [place].  SCHEDULE APPOINTMENT WITH ALISA BRANCH FOR COUNSELLING

## 2011-07-18 ENCOUNTER — Telehealth: Payer: Self-pay | Admitting: *Deleted

## 2011-07-18 NOTE — Telephone Encounter (Signed)
Patient called with her BP readings for the past 2 days. She was told to do so at her last visit. Relayed the numbers to the provider who told her to not take her medication today and we would review her med profile and give her a call back tomorrow but to continue to take BP before bed and in morning.

## 2011-07-19 ENCOUNTER — Other Ambulatory Visit: Payer: Self-pay | Admitting: Adult Health

## 2011-07-19 ENCOUNTER — Telehealth: Payer: Self-pay | Admitting: *Deleted

## 2011-07-19 DIAGNOSIS — I10 Essential (primary) hypertension: Secondary | ICD-10-CM

## 2011-07-19 MED ORDER — LISINOPRIL 10 MG PO TABS: 10.0000 mg | ORAL_TABLET | Freq: Every day | ORAL | Status: DC

## 2011-07-19 NOTE — Progress Notes (Signed)
Continues to report blood pressures with systolic values ranging below 045 mmHg with diastolics below 70 mmHg. We will decrease her lisinopril to 10 mg by mouth daily, and have her continue to check her blood pressure, at least twice a week.

## 2011-07-19 NOTE — Telephone Encounter (Addendum)
Called patient per Traci Sermon, he changed her Lisinopril from 20 mg to 10 mg.  She can take 1/2 of 20 mg until she runs out.  Monitor her blood pressures and if too high above 140/80 or below 100/60 call the clinic. Wendall Mola CMA

## 2011-07-29 ENCOUNTER — Ambulatory Visit: Payer: Medicaid Other | Admitting: Adult Health

## 2011-08-05 ENCOUNTER — Ambulatory Visit: Payer: Medicaid Other | Admitting: Family Medicine

## 2011-08-22 ENCOUNTER — Other Ambulatory Visit: Payer: Self-pay | Admitting: Neurology

## 2011-08-22 DIAGNOSIS — R200 Anesthesia of skin: Secondary | ICD-10-CM

## 2011-08-27 ENCOUNTER — Other Ambulatory Visit: Payer: Self-pay | Admitting: *Deleted

## 2011-08-27 DIAGNOSIS — I10 Essential (primary) hypertension: Secondary | ICD-10-CM

## 2011-08-27 MED ORDER — HYDROCHLOROTHIAZIDE 25 MG PO TABS: 25.0000 mg | ORAL_TABLET | Freq: Every day | ORAL | Status: DC

## 2011-08-28 ENCOUNTER — Ambulatory Visit
Admission: RE | Admit: 2011-08-28 | Discharge: 2011-08-28 | Disposition: A | Discharge status: Home / Self Care | Payer: Medicaid Other | Source: Ambulatory Visit | Attending: Neurology | Admitting: Neurology

## 2011-08-28 DIAGNOSIS — R200 Anesthesia of skin: Secondary | ICD-10-CM

## 2011-09-02 ENCOUNTER — Ambulatory Visit (INDEPENDENT_AMBULATORY_CARE_PROVIDER_SITE_OTHER): Payer: Medicaid Other | Admitting: Obstetrics and Gynecology

## 2011-09-02 VITALS — BP 119/85 | HR 64 | Temp 98.8°F | Wt 187.0 lb

## 2011-09-02 DIAGNOSIS — R87612 Low grade squamous intraepithelial lesion on cytologic smear of cervix (LGSIL): Secondary | ICD-10-CM

## 2011-09-02 DIAGNOSIS — IMO0002 Reserved for concepts with insufficient information to code with codable children: Secondary | ICD-10-CM

## 2011-09-08 NOTE — Progress Notes (Signed)
Date of visit 09/02/11.  S: Pt presents today to discuss results of recent colposcopic biopsy. Pt denies any problems at this time. O: VSS A&O x 3 in NAD  Patient Name: Abigail Medina, Abigail Medina Accession #: [id number] DOB: Apr 09, 1966 Age: 45 Gender: F Client Name [facility] Collected Date: 06/24/2011 Received Date: 06/24/2011 Physician: Maryelizabeth Kaufmann, MD Chart #: MRN # : [id number] Physician cc: Race: B Visit #: [id number] REPORT OF SURGICAL PATHOLOGY FINAL DIAGNOSIS Diagnosis 1. Cervix, biopsy, at 9 o'clock ACUTE AND CHRONIC CERVICITIS 2. Cervix, biopsy, at 1 o'clock LOW GRADE SQUAMOUS INTRAEPITHELIAL LESION, CIN-I (MILD DYSPLASIA). SEE COMMENT. 3. Endocervix, curettage BENIGN ENDOCERVICAL MUCOSA. Microscopic Comment 2. The findings correlate with the changes seen in the most recent Pap test [phone]) (BNS:gt, 06/25/11) Guerry Bruin MD Pathologist, Electronic Signature (Case signed 06/25/2011) Specimen Gross and Clinical Information Specimen(s) Obtained: 1. Cervix, biopsy, at 9 o'clock 2. Cervix, biopsy, at 1 o'clock 3. Endocervix, curettage Specimen Clinical Information 1. ASCUS, abdominal pain (isv) Gross 1. Received in formalin are tan, soft tissue fragments that are submitted in toto. Number: Two pieces, Size: 0.1 and 0.3 cm, (1 B). 2. Received in formalin is a tan, soft tissue fragment that is submitted in toto. Size: 0.3 x 0.2 x 0.1 cm, 1 block submitted. 3. Received in formalin is blood tinged mucus that is entirely submitted in one block. Volume: 1.5 x 0.7 x 0.2 cm. JC/eps 06/24/11 1 of 2 FINAL for Abigail Medina, Abigail Medina ([id number]) Report signed out from the following location(s) [facility] [address], [place], [place] [address]. CLIA #: [id number],  A/P: Hx of LGSIL: discussed with pt at length. Pt will return in 6 months for repeat Pap. Discussed diet, activity, risks, and precautions.  Clinton Gallant. Jafet Wissing III, DrHSc, MPAS, PA-C

## 2011-10-08 ENCOUNTER — Ambulatory Visit (INDEPENDENT_AMBULATORY_CARE_PROVIDER_SITE_OTHER): Payer: Medicaid Other | Admitting: Infectious Disease

## 2011-10-08 ENCOUNTER — Encounter: Payer: Self-pay | Admitting: Infectious Disease

## 2011-10-08 VITALS — BP 130/94 | HR 66 | Temp 98.2°F | Wt 183.5 lb

## 2011-10-08 DIAGNOSIS — B20 Human immunodeficiency virus [HIV] disease: Secondary | ICD-10-CM

## 2011-10-08 DIAGNOSIS — J329 Chronic sinusitis, unspecified: Secondary | ICD-10-CM

## 2011-10-08 DIAGNOSIS — I10 Essential (primary) hypertension: Secondary | ICD-10-CM

## 2011-10-08 DIAGNOSIS — Z23 Encounter for immunization: Secondary | ICD-10-CM

## 2011-10-08 DIAGNOSIS — Z113 Encounter for screening for infections with a predominantly sexual mode of transmission: Secondary | ICD-10-CM

## 2011-10-08 DIAGNOSIS — I69959 Hemiplegia and hemiparesis following unspecified cerebrovascular disease affecting unspecified side: Secondary | ICD-10-CM

## 2011-10-08 MED ORDER — AMOXICILLIN-POT CLAVULANATE 875-125 MG PO TABS: 1.0000 | ORAL_TABLET | Freq: Two times a day (BID) | ORAL | Status: AC

## 2011-10-08 NOTE — Progress Notes (Signed)
Subjective:    Patient ID: Abigail Medina, female    DOB: May 16, 1966, 45 y.o.   MRN: [id number]  HPI  45 year old African American with hx of CVA in Spring and had chronic drainage from sinuses. Has had numbness in her left nostril and left side of face. Has had drainage on the left side nearly consistently since then. She has seen Cameron Sprang at [facility] (as PCP) and was given fluticasone. Other sinus started draining. Then started coughing up phlegm for about a month. Patient denies light headedness or dizziness. No fevers or chills. She had workup this past summer for headaches that included LP that was unremarkable and CT which had shown radiographic evidence of sinus disease. She is without fevers, chills or maliase. She is now coughing but with nonproductive cough. She has had recurrent dental problems recently and infection treated with antibiotics. She claims to be adherent to her ARVs.   Review of Systems  Constitutional: Negative for fever, chills, diaphoresis, activity change, appetite change, fatigue and unexpected weight change.  HENT: Positive for dental problem, postnasal drip and sinus pressure. Negative for congestion, sore throat, rhinorrhea, sneezing and trouble swallowing.   Eyes: Negative for photophobia and visual disturbance.  Respiratory: Positive for cough. Negative for chest tightness, shortness of breath, wheezing and stridor.   Cardiovascular: Negative for chest pain, palpitations and leg swelling.  Gastrointestinal: Negative for nausea, vomiting, abdominal pain, diarrhea, constipation, blood in stool, abdominal distention and anal bleeding.  Genitourinary: Negative for dysuria, hematuria, flank pain and difficulty urinating.  Musculoskeletal: Negative for myalgias, back pain, joint swelling, arthralgias and gait problem.  Skin: Negative for color change, pallor, rash and wound.  Neurological: Negative for dizziness, tremors, weakness and light-headedness.    Hematological: Negative for adenopathy. Does not bruise/bleed easily.  Psychiatric/Behavioral: Negative for behavioral problems, confusion, sleep disturbance, dysphoric mood, decreased concentration and agitation.       Objective:   Physical Exam  Constitutional: She is oriented to person, place, and time. She appears well-developed and well-nourished. No distress.  HENT:  Head: Normocephalic and atraumatic.  Mouth/Throat: Oropharynx is clear and moist. No oropharyngeal exudate.  Eyes: Conjunctivae and EOM are normal. Pupils are equal, round, and reactive to light. No scleral icterus.  Neck: Normal range of motion. Neck supple. No JVD present.  Cardiovascular: Normal rate, regular rhythm and normal heart sounds.  Exam reveals no gallop and no friction rub.   No murmur heard. Pulmonary/Chest: Effort normal and breath sounds normal. No respiratory distress. She has no wheezes. She has no rales. She exhibits no tenderness.  Abdominal: She exhibits no distension and no mass. There is no tenderness. There is no rebound and no guarding.  Musculoskeletal: She exhibits no edema and no tenderness.  Lymphadenopathy:    She has no cervical adenopathy.  Neurological: She is alert and oriented to person, place, and time. She has normal reflexes. She exhibits normal muscle tone. Coordination normal.  Skin: Skin is warm and dry. She is not diaphoretic. No erythema. No pallor.  Psychiatric: She has a normal mood and affect. Her behavior is normal. Judgment and thought content normal.          Assessment & Plan:  HIV DISEASE Check viral load and cd4 count  CVA WITH LEFT HEMIPARESIS Continue antihypertensives, statin and aspirin  HYPERTENSION Pt has been light headed in am and taking bp meds later int he day. Will need careful but aggressive control of BP given her CVA

## 2011-10-08 NOTE — Assessment & Plan Note (Signed)
Continue antihypertensives, statin and aspirin

## 2011-10-08 NOTE — Assessment & Plan Note (Signed)
Pt has been light headed in am and taking bp meds later int he day. Will need careful but aggressive control of BP given her CVA

## 2011-10-08 NOTE — Patient Instructions (Signed)
I want you to take claritin generic otc 10mg  tablet daily Continue your steroid nasal spray  Take the augmentin for 14 days

## 2011-10-08 NOTE — Assessment & Plan Note (Signed)
Check viral load and cd4 count

## 2011-10-09 LAB — COMPLETE METABOLIC PANEL WITH GFR
Alkaline Phosphatase: 69 U/L (ref 39–117)
BUN: 17 mg/dL (ref 6–23)
GFR, Est Non African American: 83 mL/min — ABNORMAL LOW (ref >90)
Glucose, Bld: 92 mg/dL (ref 70–99)
Sodium: 138 mEq/L (ref 135–145)
Total Bilirubin: 1.7 mg/dL — ABNORMAL HIGH (ref 0.3–1.2)

## 2011-10-09 LAB — CBC WITH DIFFERENTIAL/PLATELET
Basophils Absolute: 0 10*3/uL (ref 0.0–0.1)
Basophils Relative: 0 % (ref 0–1)
Eosinophils Relative: 2 % (ref 0–5)
HCT: 38.6 % (ref 36.0–46.0)
MCHC: 33.4 g/dL (ref 30.0–36.0)
MCV: 92.1 fL (ref 78.0–100.0)
Monocytes Absolute: 0.3 10*3/uL (ref 0.1–1.0)
RDW: 13.5 % (ref 11.5–15.5)

## 2011-10-09 LAB — GC/CHLAMYDIA PROBE AMP, URINE: Chlamydia, Swab/Urine, PCR: NEGATIVE

## 2011-10-10 LAB — HIV-1 RNA QUANT-NO REFLEX-BLD
HIV 1 RNA Quant: <20 copies/mL (ref <20)
HIV-1 RNA Quant, Log: <1.3 {Log} (ref <1.30)

## 2011-12-03 ENCOUNTER — Encounter: Payer: Self-pay | Admitting: Internal Medicine

## 2011-12-03 ENCOUNTER — Ambulatory Visit (INDEPENDENT_AMBULATORY_CARE_PROVIDER_SITE_OTHER): Payer: Medicaid Other | Admitting: Internal Medicine

## 2011-12-03 ENCOUNTER — Ambulatory Visit: Payer: Medicaid Other | Admitting: Internal Medicine

## 2011-12-03 VITALS — BP 127/96 | HR 76 | Temp 98.0°F | Ht 63.5 in | Wt 182.5 lb

## 2011-12-03 DIAGNOSIS — B20 Human immunodeficiency virus [HIV] disease: Secondary | ICD-10-CM

## 2011-12-03 DIAGNOSIS — Z21 Asymptomatic human immunodeficiency virus [HIV] infection status: Secondary | ICD-10-CM

## 2011-12-03 DIAGNOSIS — Z23 Encounter for immunization: Secondary | ICD-10-CM

## 2011-12-03 DIAGNOSIS — J309 Allergic rhinitis, unspecified: Secondary | ICD-10-CM

## 2011-12-03 MED ORDER — RALTEGRAVIR POTASSIUM 400 MG PO TABS: 400.0000 mg | ORAL_TABLET | Freq: Two times a day (BID) | ORAL | Status: DC

## 2011-12-03 MED ORDER — LORATADINE 10 MG PO TABS: 10.0000 mg | ORAL_TABLET | Freq: Every day | ORAL | Status: DC

## 2011-12-03 MED ORDER — HYDROXYZINE PAMOATE 25 MG PO CAPS: 25.0000 mg | ORAL_CAPSULE | ORAL | Status: DC

## 2011-12-03 MED ORDER — EMTRICITABINE-TENOFOVIR DF 200-300 MG PO TABS: 1.0000 | ORAL_TABLET | Freq: Every day | ORAL | Status: DC

## 2011-12-03 NOTE — Progress Notes (Signed)
Patient ID: Abigail Medina, female   DOB: [birth year], 45 y.o.   MRN: [id number]  INFECTIOUS DISEASE PROGRESS NOTE    Subjective: Abigail Medina is in for her routine visit. She developed a bad cold around Thanksgiving and saw her primary care doctor. She had not tolerated the ampicillin she was given an her last visit here. She was placed on a Z-Pak and felt a little bit better but has had some persistent cough that seems to be resolving very slowly. She has not had any fever or shortness of breath recently.  She recalls missing only 2 doses of her HIV medicines since her last visit when her pharmacy was late refilling them.  She continues to struggle with depression, primarily related to being more dependent on her family since her stroke. She is also been trying to apply for disability and had to be in court this morning. She states that that stressed her out.  She states that she monitors her blood pressure at home and that sometimes it will be very low, around 120/70 and she might feel dizzy. When this occurs she won't take her medications. She has been out of her atenolol recently.  Objective:   General: She is tearful. Skin: No rash Lungs:  She has a few scattered crackles and I hear a faint wheeze in her right upper lobe. Cor: Regular S1 and S2 no murmurs Abdomen: Soft and nontender.   Lab Results Lab Results  Component Value Date   WBC 2.7* 10/08/2011   HGB 12.9 10/08/2011   HCT 38.6 10/08/2011   MCV 92.1 10/08/2011   PLT 160 10/08/2011    Lab Results  Component Value Date   CREATININE 0.84 10/08/2011   BUN 17 10/08/2011   NA 138 10/08/2011   K 4.6 10/08/2011   CL 104 10/08/2011   CO2 25 10/08/2011    Lab Results  Component Value Date   ALT 32 10/08/2011   AST 34 10/08/2011   ALKPHOS 69 10/08/2011   BILITOT 1.7* 10/08/2011      HIV 1 RNA Quant (copies/mL)  Date Value  10/08/2011 <20   07/01/2011 <20   04/18/2011 <20      CD4 T Cell Abs (cmm)  Date Value    10/08/2011 200*  07/01/2011 220*  06/01/2011 200*       Microbiology: No results found for this or any previous visit (from the past 240 hour(s)).  Studies/Results: No results found.   Assessment: Her HIV remains under very good control. She has not had significant CD4 reconstitution but her levels have remained steady around 200. I will stop her Septra PCP prophylaxis to see if this will help with reconstitution.  I think she might benefit from a case management and may need a referral for mental health counseling.  I've encouraged her to continue to take her blood pressure at home and to restart her atenolol today. I've emphasized the importance of controlling her blood pressure with a regimen that she can tolerate as continued hypertension puts her at increased risk for another stroke.  She's had a recent upper respiratory infection but this is resolving slowly.  Plan: 1. Continue her current antiretroviral medications. 2. Discontinue trimethoprim-sulfamethoxazole. 3. Restart atenolol today. 4. Referral for case management with [facility]. 5. Return to clinic in one month for repeat blood pressure check.     Cliffton Asters, MD [facility] [phone] pager   [phone] cell 12/03/2011, 11:24 AM

## 2011-12-03 NOTE — Progress Notes (Signed)
Addended by: Wendall Mola A on: 12/03/2011 11:34 AM   Modules accepted: Orders

## 2011-12-03 NOTE — Progress Notes (Signed)
Addended by: Jennet Maduro D on: 12/03/2011 11:47 AM   Modules accepted: Orders

## 2011-12-04 ENCOUNTER — Other Ambulatory Visit: Payer: Self-pay | Admitting: Adult Health

## 2011-12-04 DIAGNOSIS — F329 Major depressive disorder, single episode, unspecified: Secondary | ICD-10-CM

## 2011-12-04 DIAGNOSIS — F32A Depression, unspecified: Secondary | ICD-10-CM

## 2011-12-06 ENCOUNTER — Ambulatory Visit: Payer: Medicaid Other | Admitting: Adult Health

## 2011-12-26 ENCOUNTER — Other Ambulatory Visit: Payer: Self-pay | Admitting: Adult Health

## 2011-12-26 DIAGNOSIS — I10 Essential (primary) hypertension: Secondary | ICD-10-CM

## 2012-01-14 ENCOUNTER — Ambulatory Visit: Payer: Medicaid Other | Admitting: Internal Medicine

## 2012-01-16 ENCOUNTER — Encounter: Payer: Self-pay | Admitting: Internal Medicine

## 2012-01-16 ENCOUNTER — Ambulatory Visit (INDEPENDENT_AMBULATORY_CARE_PROVIDER_SITE_OTHER): Payer: Medicaid Other | Admitting: Internal Medicine

## 2012-01-16 DIAGNOSIS — K219 Gastro-esophageal reflux disease without esophagitis: Secondary | ICD-10-CM

## 2012-01-16 DIAGNOSIS — B20 Human immunodeficiency virus [HIV] disease: Secondary | ICD-10-CM

## 2012-01-16 DIAGNOSIS — Z21 Asymptomatic human immunodeficiency virus [HIV] infection status: Secondary | ICD-10-CM

## 2012-01-16 LAB — CBC WITH DIFFERENTIAL/PLATELET
Eosinophils Absolute: 0.1 10*3/uL (ref 0.0–0.7)
Hemoglobin: 14.4 g/dL (ref 12.0–15.0)
Lymphocytes Relative: 40 % (ref 12–46)
Lymphs Abs: 1 10*3/uL (ref 0.7–4.0)
MCH: 31.2 pg (ref 26.0–34.0)
Monocytes Relative: 12 % (ref 3–12)
Neutro Abs: 1.1 10*3/uL — ABNORMAL LOW (ref 1.7–7.7)
Neutrophils Relative %: 43 % (ref 43–77)
RBC: 4.62 MIL/uL (ref 3.87–5.11)
WBC: 2.6 10*3/uL — ABNORMAL LOW (ref 4.0–10.5)

## 2012-01-16 MED ORDER — FAMOTIDINE 20 MG PO TABS: 20.0000 mg | ORAL_TABLET | Freq: Every day | ORAL | Status: DC

## 2012-01-16 NOTE — Progress Notes (Signed)
HIV CLINIC VISIT  RFV: 3 month follow up Subjective:    Patient ID: Abigail Medina, female    DOB: 1965-12-29, 46 y.o.   MRN: [id number]  HPI Abigail Medina is 39 yoAAF with HIV, CD4 count 200(20)/VL <20 (10/07/12), currently on TRV/RAL, the patient reports having missing 4-5 doses of her evening RAL dose over the last month. She states that since her last visit she had left hip pain that is now improved, but she also notices having occasional chest pain, "pluck of my chest", lasting less than a few seconds, non radiating, is not exacerbated by exertion, sometimes brought on by food. She has noticed increasing in belching.  She reports having increasing tenderness of the muscles in the right forearm she attributes to overuse since she compensates for weakness in her left arm from sequelae of stroke  She has noticed a few skin lesions come up in the last few weeks, the last lesion on her right occiput is slow to heal. She states that she was expressing pus from it < 1 wk. Slowly improving. Still swollen, no drainage, nontender.  She has had increasing financial stressors which has led her to drink 3 days per week where in the past she was not this frequent. She is attempting to refrain from drinking.  No fever.chills.nightsweats.chest pain. diarrhea  Active Ambulatory Problems    Diagnosis Date Noted  . HIV DISEASE 08/06/2007  . THRUSH 12/28/2009  . NEVUS 08/29/2010  . HYPERTENSION 08/06/2007  . ACUTE BRONCHITIS 01/19/2009  . AMENORRHEA 07/19/2010  . CELLULITIS AND ABSCESS OF UNSPECIFIED SITE 05/21/2010  . SKIN RASH 05/21/2010  . VAGINAL DISCHARGE 12/26/2010  . 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 Seborrheic dermatitis 03/07/2011  . Folliculitis 04/18/2011  . Seborrhea capitis 04/18/2011  . Depression with anxiety 07/15/2011  . Insomnia 07/15/2011  . Chronic sinusitis 10/08/2011  . Sinusitis 10/08/2011   Resolved Ambulatory Problems    Diagnosis Date Noted  . No Resolved Ambulatory  Problems   No Additional Past Medical History   No Known Allergies Prior to Admission medications   Medication Sig Start Date End Date Taking? Authorizing Provider  atenolol (TENORMIN) 50 MG tablet Take 1 tablet (50 mg total) by mouth daily. 06/25/11 06/24/12 Yes Carolin Guernsey, NP  emtricitabine-tenofovir (TRUVADA) 200-300 MG per tablet Take 1 tablet by mouth daily. 12/03/11  Yes Cliffton Asters, MD  escitalopram (LEXAPRO) 10 MG tablet 1 TABLET BY MOUTH DAILY FOR 10 DAYS THEN 2 TABLETS DAILY THEREAFTER. 12/04/11  Yes Cliffton Asters, MD  hydrochlorothiazide 25 MG tablet Take 1 tablet (25 mg total) by mouth daily. 08/27/11  Yes Carolin Guernsey, NP  hydrocortisone 1 % lotion Apply topically 2 (two) times daily. Use sparingly (May use OTC cream) 03/07/11  Yes Carolin Guernsey, NP  hydrOXYzine (VISTARIL) 25 MG capsule Take 1 capsule (25 mg total) by mouth as directed. 1 capsule by mouth every 6 hours as needed and 1 capsule by mouth at bed time 12/03/11  Yes Cliffton Asters, MD  lisinopril (PRINIVIL,ZESTRIL) 10 MG tablet TAKE 1 TABLET BY MOUTH DAILY. 12/26/11  Yes Johny Sax, MD  loratadine (CLARITIN) 10 MG tablet Take 1 tablet (10 mg total) by mouth daily. 12/03/11  Yes Cliffton Asters, MD  raltegravir (ISENTRESS) 400 MG tablet Take 1 tablet (400 mg total) by mouth 2 (two) times daily. 12/03/11  Yes Cliffton Asters, MD  simvastatin (ZOCOR) 20 MG tablet Take 20 mg by mouth at bedtime.     Yes Historical Provider,  MD  famotidine (PEPCID) 20 MG tablet Take 1 tablet (20 mg total) by mouth daily. 01/16/12 01/15/13  Judyann Munson, MD   Social hx = no smoking, increasing etoh 3 nights per week.   Review of Systems 10 point review of systems is negative with exception to what is mentioned in HPI.    Objective:   Physical Exam BP 104/72  Pulse 61  Temp(Src) 97.8 F (36.6 C) (Oral)  Ht 5\' 3"  (1.6 m)  Wt 177 lb 8 oz (80.513 kg)  BMI 31.44 kg/m2  LMP 11/16/2011  General Appearance:     Alert, cooperative, no distress, appears stated age  Head:    Normocephalic, without obvious abnormality, atraumatic  Eyes:    PERRL, conjunctiva/corneas clear, EOM's intact, fundi    benign, both eyes  Ears:    Normal TM's and external ear canals, both ears  Nose:   Nares normal, septum midline, mucosa normal, no drainage    or sinus tenderness  Throat:   Lips, mucosa, and tongue normal; teeth and gums normal  Neck:   Supple, symmetrical, trachea midline, no adenopathy;    thyroid:  no enlargement/tenderness/nodules; no carotid   bruit or JVD  Back:     Symmetric, no curvature, ROM normal, no CVA tenderness  Lungs:     Clear to auscultation bilaterally, respirations unlabored  Chest Wall:    No tenderness or deformity   Heart:    Regular rate and rhythm, S1 and S2 normal, no murmur, rub   or gallop  Breast Exam:    No tenderness, masses, or nipple abnormality  Abdomen:     Soft, non-tender, bowel sounds active all four quadrants,    no masses, no organomegaly        Extremities:   Extremities normal, atraumatic, no cyanosis or edema  Pulses:   2+ and symmetric all extremities  Skin:   Skin color, texture, turgor normal, no rashes or lesions  Lymph nodes:   Cervical, supraclavicular, and axillary nodes normal  Neurologic:   CNII-XII intact, normal strength, sensation and reflexes    throughout         Assessment & Plan:  HIV = will check CD 4 count and VL. Encourage her to remember taking her 2nd dose.   Chest pain possible gerd = will do a trial of ranitidine for the next month to see if it improves her symptoms. Description does not appear to be cardiac in nature.  Skin infection= patient suffers from occasional boils. None appear to require I X D at this time.  Health maintenance = will check annual RPR today. at next visit we will check hepatitis b surface antibody to see if she responded to her hep b series, as well as give pneumococcal vax.  Alcohol use= patient  realizes this is related to her increasing financial stressors. The patient has appt with lisa, counselor.  msk pain = can use occasional tylenol or ibuprofen.  rtc in 3 months

## 2012-01-17 LAB — T-HELPER CELL (CD4) - (RCID CLINIC ONLY)
CD4 % Helper T Cell: 20 % — ABNORMAL LOW (ref 33–55)
CD4 T Cell Abs: 250 uL — ABNORMAL LOW (ref 400–2700)

## 2012-01-17 LAB — BASIC METABOLIC PANEL WITH GFR
CO2: 25 mEq/L (ref 19–32)
GFR, Est African American: >89 mL/min
Glucose, Bld: 94 mg/dL (ref 70–99)
Potassium: 4 mEq/L (ref 3.5–5.3)
Sodium: 139 mEq/L (ref 135–145)

## 2012-01-17 LAB — RPR

## 2012-01-23 ENCOUNTER — Ambulatory Visit: Payer: Medicaid Other | Admitting: Internal Medicine

## 2012-01-30 DIAGNOSIS — I6990 Unspecified sequelae of unspecified cerebrovascular disease: Secondary | ICD-10-CM | POA: Insufficient documentation

## 2012-01-30 DIAGNOSIS — R209 Unspecified disturbances of skin sensation: Secondary | ICD-10-CM | POA: Insufficient documentation

## 2012-03-02 ENCOUNTER — Other Ambulatory Visit: Payer: Self-pay | Admitting: *Deleted

## 2012-03-02 DIAGNOSIS — B20 Human immunodeficiency virus [HIV] disease: Secondary | ICD-10-CM

## 2012-03-19 ENCOUNTER — Other Ambulatory Visit: Payer: Medicaid Other

## 2012-03-31 ENCOUNTER — Other Ambulatory Visit: Payer: Medicaid Other

## 2012-03-31 ENCOUNTER — Other Ambulatory Visit: Payer: Self-pay | Admitting: Infectious Diseases

## 2012-04-02 ENCOUNTER — Ambulatory Visit: Payer: Medicaid Other | Admitting: Internal Medicine

## 2012-04-03 ENCOUNTER — Ambulatory Visit: Payer: Medicaid Other | Admitting: Internal Medicine

## 2012-04-09 ENCOUNTER — Ambulatory Visit (INDEPENDENT_AMBULATORY_CARE_PROVIDER_SITE_OTHER): Payer: Medicaid Other | Admitting: Internal Medicine

## 2012-04-09 ENCOUNTER — Other Ambulatory Visit: Payer: Self-pay | Admitting: Infectious Diseases

## 2012-04-09 ENCOUNTER — Encounter: Payer: Self-pay | Admitting: Internal Medicine

## 2012-04-09 VITALS — BP 132/90 | HR 79 | Temp 97.7°F | Wt 176.0 lb

## 2012-04-09 DIAGNOSIS — B20 Human immunodeficiency virus [HIV] disease: Secondary | ICD-10-CM

## 2012-04-09 DIAGNOSIS — Z21 Asymptomatic human immunodeficiency virus [HIV] infection status: Secondary | ICD-10-CM

## 2012-04-09 DIAGNOSIS — Z0000 Encounter for general adult medical examination without abnormal findings: Secondary | ICD-10-CM

## 2012-04-09 DIAGNOSIS — Z23 Encounter for immunization: Secondary | ICD-10-CM

## 2012-04-09 DIAGNOSIS — Z789 Other specified health status: Secondary | ICD-10-CM

## 2012-04-09 LAB — COMPREHENSIVE METABOLIC PANEL
ALT: 28 U/L (ref 0–35)
Albumin: 4.3 g/dL (ref 3.5–5.2)
CO2: 29 mEq/L (ref 19–32)
Glucose, Bld: 93 mg/dL (ref 70–99)
Potassium: 3.9 mEq/L (ref 3.5–5.3)
Sodium: 140 mEq/L (ref 135–145)
Total Protein: 7.4 g/dL (ref 6.0–8.3)

## 2012-04-09 LAB — URINALYSIS
Glucose, UA: NEGATIVE mg/dL
Leukocytes, UA: NEGATIVE
Protein, ur: NEGATIVE mg/dL
Specific Gravity, Urine: 1.021 (ref 1.005–1.030)
pH: 6.5 (ref 5.0–8.0)

## 2012-04-09 LAB — CBC WITH DIFFERENTIAL/PLATELET
Hemoglobin: 14 g/dL (ref 12.0–15.0)
Lymphocytes Relative: 50 % — ABNORMAL HIGH (ref 12–46)
Lymphs Abs: 1.5 10*3/uL (ref 0.7–4.0)
Monocytes Relative: 11 % (ref 3–12)
Neutro Abs: 1 10*3/uL — ABNORMAL LOW (ref 1.7–7.7)
Neutrophils Relative %: 36 % — ABNORMAL LOW (ref 43–77)
RBC: 4.43 MIL/uL (ref 3.87–5.11)
WBC: 2.9 10*3/uL — ABNORMAL LOW (ref 4.0–10.5)

## 2012-04-09 LAB — LIPID PANEL
Cholesterol: 134 mg/dL (ref 0–200)
Total CHOL/HDL Ratio: 2.4 Ratio

## 2012-04-09 NOTE — Progress Notes (Signed)
HIV CLINIC VISIT  RFV = routine follow up, and rash Subjective:    Patient ID: Abigail Medina, female    DOB: [birth year], 46 y.o.   MRN: [id number]  HPI Abigail Medina is a 46yo F with HIV, CD 4 count of 250(20%)/VL , 20, on ral/truvada as of end Jan 2013. Doing well. She did miss 4 days straight of medications since she went away on vacation to [place] but extended her trip without having enough meds. She has restarted her medications without difficulty. She is doing well with exception to having a fine rash to her neck. She denies any new soaps, new detergents. No other areas on her body is involved. She reports still having some "cool sensation" to left arm as residual to stroke  Prior to Admission medications   Medication Sig Start Date End Date Taking? Authorizing Provider  atenolol (TENORMIN) 50 MG tablet Take 1 tablet (50 mg total) by mouth daily. 06/25/11 06/24/12 Yes Carolin Guernsey, NP  emtricitabine-tenofovir (TRUVADA) 200-300 MG per tablet Take 1 tablet by mouth daily. 12/03/11  Yes Cliffton Asters, MD  escitalopram (LEXAPRO) 10 MG tablet 1 TABLET BY MOUTH DAILY FOR 10 DAYS THEN 2 TABLETS DAILY THEREAFTER. 12/04/11  Yes Cliffton Asters, MD  famotidine (PEPCID) 20 MG tablet Take 1 tablet (20 mg total) by mouth daily. 01/16/12 01/15/13 Yes Judyann Munson, MD  hydrochlorothiazide 25 MG tablet Take 1 tablet (25 mg total) by mouth daily. 08/27/11  Yes Carolin Guernsey, NP  hydrocortisone 1 % lotion Apply topically 2 (two) times daily. Use sparingly (May use OTC cream) 03/07/11  Yes Carolin Guernsey, NP  hydrOXYzine (VISTARIL) 25 MG capsule Take 1 capsule (25 mg total) by mouth as directed. 1 capsule by mouth every 6 hours as needed and 1 capsule by mouth at bed time 12/03/11  Yes Cliffton Asters, MD  lisinopril (PRINIVIL,ZESTRIL) 10 MG tablet TAKE 1 TABLET BY MOUTH EVERY DAY 03/31/12  Yes Ginnie Smart, MD  loratadine (CLARITIN) 10 MG tablet Take 1 tablet (10 mg total) by mouth daily. 12/03/11  Yes  Cliffton Asters, MD  simvastatin (ZOCOR) 20 MG tablet Take 20 mg by mouth at bedtime.     Yes Historical Provider, MD  ISENTRESS 400 MG tablet TAKE 1 TABLET BY MOUTH TWICE DAILY 04/09/12   Judyann Munson, MD   Active Ambulatory Problems    Diagnosis Date Noted  . HIV DISEASE 08/06/2007  . THRUSH 12/28/2009  . NEVUS 08/29/2010  . HYPERTENSION 08/06/2007  . ACUTE BRONCHITIS 01/19/2009  . AMENORRHEA 07/19/2010  . CELLULITIS AND ABSCESS OF UNSPECIFIED SITE 05/21/2010  . SKIN RASH 05/21/2010  . VAGINAL DISCHARGE 12/26/2010  . 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 Seborrheic dermatitis 03/07/2011  . Folliculitis 04/18/2011  . Seborrhea capitis 04/18/2011  . Depression with anxiety 07/15/2011  . Insomnia 07/15/2011  . Chronic sinusitis 10/08/2011  . Sinusitis 10/08/2011   Resolved Ambulatory Problems    Diagnosis Date Noted  . No Resolved Ambulatory Problems   No Additional Past Medical History   Review of Systems  Constitutional: Negative for fever, chills, diaphoresis, activity change, appetite change, fatigue and unexpected weight change.  HENT: Negative for congestion, sore throat, rhinorrhea, sneezing, trouble swallowing and sinus pressure.  Eyes: Negative for photophobia and visual disturbance.  Respiratory: Negative for cough, chest tightness, shortness of breath, wheezing and stridor.  Cardiovascular: Negative for chest pain, palpitations and leg swelling.  Gastrointestinal: Negative for nausea, vomiting, abdominal pain, diarrhea, constipation, blood in stool, abdominal distention  and anal bleeding.  Genitourinary: Negative for dysuria, hematuria, flank pain and difficulty urinating.  Musculoskeletal: Negative for myalgias, back pain, joint swelling, arthralgias and gait problem.  Skin: Negative for color change, pallor, rash and wound.  Neurological: Negative for dizziness, tremors, weakness and light-headedness.  Hematological: Negative for adenopathy. Does not  bruise/bleed easily.  Psychiatric/Behavioral: Negative for behavioral problems, confusion, sleep disturbance, dysphoric mood, decreased concentration and agitation.       Objective:   Physical Exam BP 132/90  Pulse 79  Temp(Src) 97.7 F (36.5 C) (Oral)  Wt 176 lb (79.833 kg)  General Appearance:    Alert, cooperative, no distress, appears stated age  Head:    Normocephalic, without obvious abnormality, atraumatic  Eyes:    PERRL, conjunctiva/corneas clear, EOM's intact, fundi    benign, both eyes  Ears:    Normal TM's and external ear canals, both ears  Nose:   Nares normal, septum midline, mucosa normal, no drainage    or sinus tenderness  Throat:   Lips, mucosa, and tongue normal; teeth and gums normal  Neck:   Supple, symmetrical, trachea midline, no adenopathy;    Fine rash,small <4mm, macular papular .  Back:     Symmetric, no curvature, ROM normal, no CVA tenderness  Lungs:     Clear to auscultation bilaterally, respirations unlabored  Chest Wall:    No tenderness or deformity   Heart:    Regular rate and rhythm, S1 and S2 normal, no murmur, rub   or gallop     Abdomen:     Soft, non-tender, bowel sounds active all four quadrants,    no masses, no organomegaly        Extremities:   Extremities normal, atraumatic, no cyanosis or edema  Pulses:   2+ and symmetric all extremities  Skin:   Skin color, texture, turgor normal, no rashes or lesions  Lymph nodes:   Cervical, supraclavicular, and axillary nodes normal          Assessment & Plan:  hiv = continue with ral/truvada; will check cd 4 count, vl  Health maintenance = will check lipids, ua, ur chlam/gc.  hiv prevention =talked about using condoms, not to have unprotected sex especially if anyone having herpes outbreak.  Women's health = will need to schedule pap at next visit. And will also get mammo study.  Rash= recommend 50/50 of hydrocortisone 1% with eucerin or vaseline to apply to affected area BID x 2  wks.  rtc in 3 months

## 2012-04-10 LAB — T-HELPER CELL (CD4) - (RCID CLINIC ONLY)
CD4 % Helper T Cell: 21 % — ABNORMAL LOW (ref 33–55)
CD4 T Cell Abs: 260 uL — ABNORMAL LOW (ref 400–2700)

## 2012-04-13 LAB — HIV-1 RNA QUANT-NO REFLEX-BLD
HIV 1 RNA Quant: <20 copies/mL (ref <20)
HIV-1 RNA Quant, Log: <1.3 {Log} (ref <1.30)

## 2012-04-14 ENCOUNTER — Other Ambulatory Visit: Payer: Self-pay | Admitting: Neurology

## 2012-04-14 ENCOUNTER — Ambulatory Visit: Payer: Medicaid Other | Admitting: Adult Health

## 2012-04-19 ENCOUNTER — Other Ambulatory Visit: Payer: Medicaid Other

## 2012-04-19 ENCOUNTER — Inpatient Hospital Stay: Admission: RE | Admit: 2012-04-19 | Payer: Medicaid Other | Source: Ambulatory Visit

## 2012-04-22 ENCOUNTER — Encounter: Payer: Self-pay | Admitting: *Deleted

## 2012-04-23 ENCOUNTER — Ambulatory Visit
Admission: RE | Admit: 2012-04-23 | Discharge: 2012-04-23 | Disposition: A | Discharge status: Home / Self Care | Payer: Medicaid Other | Source: Ambulatory Visit | Attending: Neurology | Admitting: Neurology

## 2012-04-23 ENCOUNTER — Other Ambulatory Visit: Payer: Medicaid Other

## 2012-05-12 ENCOUNTER — Other Ambulatory Visit: Payer: Self-pay | Admitting: Adult Health

## 2012-05-12 ENCOUNTER — Other Ambulatory Visit: Payer: Self-pay | Admitting: *Deleted

## 2012-05-12 DIAGNOSIS — B20 Human immunodeficiency virus [HIV] disease: Secondary | ICD-10-CM

## 2012-05-12 MED ORDER — EMTRICITABINE-TENOFOVIR DF 200-300 MG PO TABS: 1.0000 | ORAL_TABLET | Freq: Every day | ORAL | Status: DC

## 2012-05-14 ENCOUNTER — Ambulatory Visit
Admission: RE | Admit: 2012-05-14 | Discharge: 2012-05-14 | Disposition: A | Discharge status: Home / Self Care | Payer: Medicaid Other | Source: Ambulatory Visit | Attending: Neurology | Admitting: Neurology

## 2012-05-14 ENCOUNTER — Other Ambulatory Visit: Payer: Medicaid Other

## 2012-05-14 MED ORDER — GADOBENATE DIMEGLUMINE 529 MG/ML IV SOLN
16.0000 mL | Freq: Once | INTRAVENOUS | Status: AC | PRN
  Administered 2012-05-14: 16 mL via INTRAVENOUS

## 2012-06-24 ENCOUNTER — Other Ambulatory Visit: Payer: Medicaid Other

## 2012-06-24 DIAGNOSIS — B20 Human immunodeficiency virus [HIV] disease: Secondary | ICD-10-CM

## 2012-06-24 LAB — COMPREHENSIVE METABOLIC PANEL
Alkaline Phosphatase: 63 U/L (ref 39–117)
BUN: 11 mg/dL (ref 6–23)
CO2: 28 mEq/L (ref 19–32)
Creat: 0.83 mg/dL (ref 0.50–1.10)
Glucose, Bld: 83 mg/dL (ref 70–99)
Total Bilirubin: 1.3 mg/dL — ABNORMAL HIGH (ref 0.3–1.2)

## 2012-06-24 LAB — CBC WITH DIFFERENTIAL/PLATELET
Basophils Relative: 0 % (ref 0–1)
Eosinophils Absolute: 0.1 10*3/uL (ref 0.0–0.7)
Eosinophils Relative: 4 % (ref 0–5)
Hemoglobin: 13.9 g/dL (ref 12.0–15.0)
Lymphs Abs: 1.1 10*3/uL (ref 0.7–4.0)
MCH: 31.4 pg (ref 26.0–34.0)
MCHC: 35 g/dL (ref 30.0–36.0)
MCV: 89.8 fL (ref 78.0–100.0)
Monocytes Relative: 11 % (ref 3–12)
RBC: 4.42 MIL/uL (ref 3.87–5.11)

## 2012-06-25 ENCOUNTER — Other Ambulatory Visit: Payer: Medicaid Other

## 2012-06-25 LAB — T-HELPER CELL (CD4) - (RCID CLINIC ONLY)
CD4 % Helper T Cell: 22 % — ABNORMAL LOW (ref 33–55)
CD4 T Cell Abs: 270 uL — ABNORMAL LOW (ref 400–2700)

## 2012-06-27 LAB — HIV-1 RNA QUANT-NO REFLEX-BLD
HIV 1 RNA Quant: <20 copies/mL (ref <20)
HIV-1 RNA Quant, Log: <1.3 {Log} (ref <1.30)

## 2012-07-09 ENCOUNTER — Ambulatory Visit: Payer: Medicaid Other | Admitting: Internal Medicine

## 2012-07-09 ENCOUNTER — Encounter: Payer: Self-pay | Admitting: Internal Medicine

## 2012-07-09 ENCOUNTER — Ambulatory Visit (INDEPENDENT_AMBULATORY_CARE_PROVIDER_SITE_OTHER): Payer: Medicaid Other | Admitting: Internal Medicine

## 2012-07-09 VITALS — BP 137/94 | HR 69 | Temp 98.0°F | Wt 170.0 lb

## 2012-07-09 DIAGNOSIS — B20 Human immunodeficiency virus [HIV] disease: Secondary | ICD-10-CM

## 2012-07-09 NOTE — Progress Notes (Signed)
HIV CLINIC NOTE  RFV: 3 month follow up Subjective:    Patient ID: Abigail Medina, female    DOB: 02/17/1966, 46 y.o.   MRN: [id number]  HPI Abigail Medina is a 46yo F with HIV, CD 4 count of 270(22%)/VL<20, on raltegravir and truvada. She states she is still having difficulty remembering to take her 2nd dose of RLG. She is otherwise doing well. She started to date an old fiance and has questions regarding disclosure and condom use for protection, discordant couples. We spent majority of the visit discussing these issues.  She denies any fever/chills/nightsweats/n/v/d/rash/no dysphagia. She states that she needs [facility]. Last mammo 2 years ago, and recs were to get follow up 6 mo. U/S.  Current Outpatient Prescriptions on File Prior to Visit  Medication Sig Dispense Refill  . atenolol (TENORMIN) 50 MG tablet Take 1 tablet (50 mg total) by mouth daily.  30 tablet  11  . emtricitabine-tenofovir (TRUVADA) 200-300 MG per tablet Take 1 tablet by mouth daily.  30 tablet  6  . escitalopram (LEXAPRO) 10 MG tablet 1 TABLET BY MOUTH DAILY FOR 10 DAYS THEN 2 TABLETS DAILY THEREAFTER.  60 tablet  5  . famotidine (PEPCID) 20 MG tablet Take 1 tablet (20 mg total) by mouth daily.  30 tablet  1  . hydrochlorothiazide (HYDRODIURIL) 25 MG tablet TAKE 1 TABLET BY MOUTH EVERY DAY  30 tablet  6  . hydrocortisone 1 % lotion Apply topically 2 (two) times daily. Use sparingly (May use OTC cream)  120 mL  0  . hydrOXYzine (VISTARIL) 25 MG capsule Take 1 capsule (25 mg total) by mouth as directed. 1 capsule by mouth every 6 hours as needed and 1 capsule by mouth at bed time  30 capsule  2  . ISENTRESS 400 MG tablet TAKE 1 TABLET BY MOUTH TWICE DAILY  60 tablet  10  . lisinopril (PRINIVIL,ZESTRIL) 10 MG tablet TAKE 1 TABLET BY MOUTH EVERY DAY  30 tablet  2  . loratadine (CLARITIN) 10 MG tablet Take 1 tablet (10 mg total) by mouth daily.  30 tablet  6  . simvastatin (ZOCOR) 20 MG tablet Take 20 mg by  mouth at bedtime.          Review of Systems Review of Systems  Constitutional: Negative for fever, chills, diaphoresis, activity change, appetite change, fatigue and unexpected weight change.  HENT: Negative for congestion, sore throat, rhinorrhea, sneezing, trouble swallowing and sinus pressure.  Eyes: Negative for photophobia and visual disturbance.  Respiratory: Negative for cough, chest tightness, shortness of breath, wheezing and stridor.  Cardiovascular: Negative for chest pain, palpitations and leg swelling.  Gastrointestinal: Negative for nausea, vomiting, abdominal pain, diarrhea, constipation, blood in stool, abdominal distention and anal bleeding.  Genitourinary: Negative for dysuria, hematuria, flank pain and difficulty urinating.  Musculoskeletal: Negative for myalgias, back pain, joint swelling, arthralgias and gait problem.  Skin: Negative for color change, pallor, rash and wound.  Neurological: Negative for dizziness, tremors, weakness and light-headedness.  Hematological: Negative for adenopathy. Does not bruise/bleed easily.  Psychiatric/Behavioral: Negative for behavioral problems, confusion, sleep disturbance, dysphoric mood, decreased concentration and agitation.       Objective:   Physical Exam  BP 137/94  Pulse 69  Temp 98 F (36.7 C) (Oral)  Wt 170 lb (77.111 kg) Physical Exam  Constitutional:  oriented to person, place, and time.  appears well-developed and well-nourished. No distress.  HENT: [place]/AT, PERRLA, EOMI, Mouth/Throat: Oropharynx  is clear and moist. No oropharyngeal exudate.  Cardiovascular: Normal rate, regular rhythm and normal heart sounds. Exam reveals no gallop and no friction rub. No murmur heard.  Pulmonary/Chest: Effort normal and breath sounds normal. No respiratory distress. He has no wheezes.  Abdominal: Soft. Bowel sounds are normal.  no distension. There is no tenderness.  Lymphadenopathy:  no cervical adenopathy.  Skin: Skin is warm  and dry. No rash noted. No erythema.     Assessment & Plan:  HIV = continue with truvada daily and raltegravir BID. Recommended that she keeps pills by her toothbrush at night to remember to take 2nd dose.  Women's health= will need to schedule bilateral axillary/breast u/s as follow up and also, will have the patient schedule her pap smear.  hiv prevention = condoms with new partner  Health prevention = flu vax at next appt.  rtc in 3 months

## 2012-07-13 ENCOUNTER — Encounter (HOSPITAL_COMMUNITY): Payer: Self-pay

## 2012-07-13 ENCOUNTER — Emergency Department (HOSPITAL_COMMUNITY)
Admission: EM | Admit: 2012-07-13 | Discharge: 2012-07-13 | Disposition: A | Discharge status: Home / Self Care | Payer: Medicaid Other | Source: Home / Self Care

## 2012-07-13 DIAGNOSIS — T783XXA Angioneurotic edema, initial encounter: Secondary | ICD-10-CM

## 2012-07-13 MED ORDER — PREDNISONE 20 MG PO TABS: 40.0000 mg | ORAL_TABLET | Freq: Every day | ORAL | Status: AC

## 2012-07-13 MED ORDER — HYDROXYZINE PAMOATE 25 MG PO CAPS: 25.0000 mg | ORAL_CAPSULE | ORAL | Status: DC

## 2012-07-13 NOTE — ED Provider Notes (Signed)
Abigail Medina is a 46 y.o. female who presents to Urgent Care today for swelling of the lips and eye.  Patient notes onset of left upper lips swelling for the last 6 days.  This was not caused by any known contact or bug bite.  It started spontaneously and is not itchy or painful. She denies any fevers or chills and feels well otherwise. Today her left upper eyelid started swelling without any itchiness or pain.  She denies any trouble breathing trouble swallowing wheezing.  She feels well.  She stopped taking lisinopril 2 days ago.   She has a primary care doctor and can get an appointment in a few days.    PMH reviewed. Significant for stroke about one year ago History  Substance Use Topics  . Smoking status: Former Smoker    Quit date: 12/04/2010  . Smokeless tobacco: Never Used  . Alcohol Use: 1.0 - 1.5 oz/week    2-3 drink(s) per week     liquor or beer; stated has increased with social meetings   ROS as above Medications reviewed. No current facility-administered medications for this encounter.   Current Outpatient Prescriptions  Medication Sig Dispense Refill  . atenolol (TENORMIN) 50 MG tablet Take 1 tablet (50 mg total) by mouth daily.  30 tablet  11  . emtricitabine-tenofovir (TRUVADA) 200-300 MG per tablet Take 1 tablet by mouth daily.  30 tablet  6  . escitalopram (LEXAPRO) 10 MG tablet 1 TABLET BY MOUTH DAILY FOR 10 DAYS THEN 2 TABLETS DAILY THEREAFTER.  60 tablet  5  . famotidine (PEPCID) 20 MG tablet Take 1 tablet (20 mg total) by mouth daily.  30 tablet  1  . hydrochlorothiazide (HYDRODIURIL) 25 MG tablet TAKE 1 TABLET BY MOUTH EVERY DAY  30 tablet  6  . hydrocortisone 1 % lotion Apply topically 2 (two) times daily. Use sparingly (May use OTC cream)  120 mL  0  . hydrOXYzine (VISTARIL) 25 MG capsule Take 1 capsule (25 mg total) by mouth as directed. 1 capsule by mouth every 6 hours as needed and 1 capsule by mouth at bed time  30 capsule  2  . ISENTRESS 400 MG tablet TAKE 1  TABLET BY MOUTH TWICE DAILY  60 tablet  10  . loratadine (CLARITIN) 10 MG tablet Take 1 tablet (10 mg total) by mouth daily.  30 tablet  6  . predniSONE (DELTASONE) 20 MG tablet Take 2 tablets (40 mg total) by mouth daily.  14 tablet  0  . simvastatin (ZOCOR) 20 MG tablet Take 20 mg by mouth at bedtime.        Marland Kitchen DISCONTD: lisinopril (PRINIVIL,ZESTRIL) 10 MG tablet TAKE 1 TABLET BY MOUTH EVERY DAY  30 tablet  2    Exam:  BP 128/91  Pulse 88  Temp 98 F (36.7 C) (Oral)  Resp 12  SpO2 99% Gen: Well NAD HEENT: EOMI MMM, mild swelling of the left upper eyelid and left upper lip. No involvement of the tongue or posterior pharynx.  No injection tenderness or erythema.  Lungs: CTABL Nl WOB Heart: RRR no MRG Exts: Non edematous BL  LE, warm and well perfused.   No results found for this or any previous visit (from the past 24 hour(s)). No results found.  Assessment and Plan: 46 y.o. female with probable angioedema.  I agree to withhold lisinopril. Also will prescribe prednisone and hydroxyzine.  I discussion with the patient the pros versus cons of observation in the emergency  room.  If she is doing very well clinically and not worsening I feel that watchful waiting at home with prednisone and hydroxyzine is reasonable.  She will followup with her primary care doctor in a few days. Discussed warning signs or symptoms. Specifically warned about worsening swelling trouble breathing or trouble swallowing . Please see discharge instructions. Patient expresses understanding.      Rodolph Bong, MD 07/13/12 [phone]

## 2012-07-13 NOTE — ED Notes (Signed)
C/o swelling in face for 1 year

## 2012-07-14 NOTE — ED Provider Notes (Signed)
Medical screening examination/treatment/procedure(s) were performed by non-physician practitioner and as supervising physician I was immediately available for consultation/collaboration.  Abigail Medina   Taronda Comacho, MD 07/14/12 1858 

## 2012-07-15 DIAGNOSIS — E785 Hyperlipidemia, unspecified: Secondary | ICD-10-CM | POA: Insufficient documentation

## 2012-07-15 DIAGNOSIS — I10 Essential (primary) hypertension: Secondary | ICD-10-CM | POA: Insufficient documentation

## 2012-07-15 DIAGNOSIS — R600 Localized edema: Secondary | ICD-10-CM | POA: Insufficient documentation

## 2012-07-29 ENCOUNTER — Other Ambulatory Visit: Payer: Self-pay | Admitting: Adult Health

## 2012-08-20 DIAGNOSIS — Z79899 Other long term (current) drug therapy: Secondary | ICD-10-CM | POA: Insufficient documentation

## 2012-08-20 DIAGNOSIS — Z21 Asymptomatic human immunodeficiency virus [HIV] infection status: Secondary | ICD-10-CM | POA: Insufficient documentation

## 2012-08-20 DIAGNOSIS — B20 Human immunodeficiency virus [HIV] disease: Secondary | ICD-10-CM | POA: Insufficient documentation

## 2012-08-20 DIAGNOSIS — R220 Localized swelling, mass and lump, head: Secondary | ICD-10-CM | POA: Insufficient documentation

## 2012-08-20 DIAGNOSIS — Z889 Allergy status to unspecified drugs, medicaments and biological substances status: Secondary | ICD-10-CM | POA: Insufficient documentation

## 2012-09-29 ENCOUNTER — Other Ambulatory Visit: Payer: Medicaid Other

## 2012-10-10 ENCOUNTER — Encounter (HOSPITAL_COMMUNITY): Payer: Self-pay

## 2012-10-10 ENCOUNTER — Emergency Department (HOSPITAL_COMMUNITY)
Admission: EM | Admit: 2012-10-10 | Discharge: 2012-10-10 | Disposition: A | Discharge status: Home / Self Care | Payer: Medicaid Other | Attending: Emergency Medicine | Admitting: Emergency Medicine

## 2012-10-10 ENCOUNTER — Emergency Department (HOSPITAL_COMMUNITY): Payer: Medicaid Other

## 2012-10-10 DIAGNOSIS — Y939 Activity, unspecified: Secondary | ICD-10-CM | POA: Insufficient documentation

## 2012-10-10 DIAGNOSIS — Z87891 Personal history of nicotine dependence: Secondary | ICD-10-CM | POA: Insufficient documentation

## 2012-10-10 DIAGNOSIS — X500XXA Overexertion from strenuous movement or load, initial encounter: Secondary | ICD-10-CM | POA: Insufficient documentation

## 2012-10-10 DIAGNOSIS — S93401A Sprain of unspecified ligament of right ankle, initial encounter: Secondary | ICD-10-CM

## 2012-10-10 DIAGNOSIS — Y929 Unspecified place or not applicable: Secondary | ICD-10-CM | POA: Insufficient documentation

## 2012-10-10 DIAGNOSIS — Z79899 Other long term (current) drug therapy: Secondary | ICD-10-CM | POA: Insufficient documentation

## 2012-10-10 DIAGNOSIS — S93409A Sprain of unspecified ligament of unspecified ankle, initial encounter: Secondary | ICD-10-CM | POA: Insufficient documentation

## 2012-10-10 DIAGNOSIS — Z7982 Long term (current) use of aspirin: Secondary | ICD-10-CM | POA: Insufficient documentation

## 2012-10-10 MED ORDER — HYDROCODONE-ACETAMINOPHEN 5-325 MG PO TABS
2.0000 | ORAL_TABLET | Freq: Once | ORAL | Status: AC
  Administered 2012-10-10: 2 via ORAL
  Filled 2012-10-10: qty 2

## 2012-10-10 MED ORDER — IBUPROFEN 800 MG PO TABS: 800.0000 mg | ORAL_TABLET | Freq: Three times a day (TID) | ORAL | Status: DC

## 2012-10-10 MED ORDER — HYDROCODONE-ACETAMINOPHEN 5-325 MG PO TABS
2.0000 | ORAL_TABLET | ORAL | Status: AC | PRN
Start: 2012-10-10 — End: 2012-10-20

## 2012-10-10 NOTE — ED Notes (Signed)
Pt. Reports rolling her ankle at 1930 yesterday. States heard a "pop" but was not in pain. Reports pain starting around 0200 this AM. Top of right foot minimally swollen with slight bruising. Pt. fidgeting in pain.

## 2012-10-10 NOTE — Progress Notes (Signed)
Orthopedic Tech Progress Note Patient Details:  Abigail Medina 03-06-1966 [id number] Ankle ASO brace applied to Right ankle. Crutches fitted for patient height and comfort. Tolerated well.  Ortho Devices Type of Ortho Device: Crutches;ASO Ortho Device/Splint Location: Right LE Ortho Device/Splint Interventions: Application   Asia R Thompson 10/10/2012, 9:38 AM

## 2012-10-10 NOTE — ED Provider Notes (Signed)
History     CSN: [id number]  Arrival date & time 10/10/12  1478   First MD Initiated Contact with Patient 10/10/12 [phone]      Chief Complaint  Patient presents with  . Foot Pain    (Consider location/radiation/quality/duration/timing/severity/associated sxs/prior treatment) Patient is a 46 y.o. female presenting with ankle pain. The history is provided by the patient. No language interpreter was used.  Ankle Pain  The incident occurred yesterday. The injury mechanism was torsion. The pain is present in the right ankle and right foot. The quality of the pain is described as sharp and throbbing. The pain is severe. The pain has been worsening since onset. Associated symptoms include inability to bear weight. Pertinent negatives include no numbness. Nothing aggravates the symptoms. She has tried ice for the symptoms.  Pt reports swelling and pain  History reviewed. No pertinent past medical history.  History reviewed. No pertinent past surgical history.  No family history on file.  History  Substance Use Topics  . Smoking status: Former Smoker    Quit date: 12/04/2010  . Smokeless tobacco: Never Used  . Alcohol Use: 1.0 - 1.5 oz/week    2-3 drink(s) per week     liquor or beer; stated has increased with social meetings    OB History    Grav Para Term Preterm Abortions TAB SAB Ect Mult Living                  Review of Systems  Musculoskeletal: Positive for joint swelling.  Neurological: Negative for numbness.  All other systems reviewed and are negative.    Allergies  Lisinopril  Home Medications   Current Outpatient Rx  Name Route Sig Dispense Refill  . ASPIRIN 81 MG PO CHEW Oral Chew 81 mg by mouth daily.    . ATENOLOL 50 MG PO TABS Oral Take 50 mg by mouth daily.    Marland Kitchen EMTRICITABINE-TENOFOVIR 200-300 MG PO TABS Oral Take 1 tablet by mouth daily. 30 tablet 6  . FAMOTIDINE 20 MG PO TABS Oral Take 1 tablet (20 mg total) by mouth daily. 30 tablet 1  . GABAPENTIN  300 MG PO CAPS Oral Take 300 mg by mouth 3 (three) times daily.    Marland Kitchen HYDROCHLOROTHIAZIDE 25 MG PO TABS  TAKE 1 TABLET BY MOUTH EVERY DAY 30 tablet 6  . HYDROXYZINE HCL 25 MG PO TABS Oral Take 25 mg by mouth at bedtime.    Marland Kitchen LORATADINE 10 MG PO TABS Oral Take 1 tablet (10 mg total) by mouth daily. 30 tablet 6  . RALTEGRAVIR POTASSIUM 400 MG PO TABS Oral Take 400 mg by mouth 2 (two) times daily.    Marland Kitchen SIMVASTATIN 20 MG PO TABS Oral Take 20 mg by mouth at bedtime.        BP 134/94  Pulse 68  Temp 98.5 F (36.9 C) (Oral)  Resp 24  SpO2 100%  Physical Exam  Nursing note and vitals reviewed. Constitutional: She is oriented to person, place, and time. She appears well-developed and well-nourished.  HENT:  Head: Normocephalic and atraumatic.  Musculoskeletal: She exhibits tenderness.       Tender right foot and right ankle,  Decreased range of motion,  nv and ns intact,  Small amount of swelling,    Neurological: She is alert and oriented to person, place, and time. She has normal reflexes.  Skin: Skin is warm.  Psychiatric: She has a normal mood and affect.    ED Course  Procedures (including critical care time)  Labs Reviewed - No data to display No results found.   1. Sprain of ankle, right       MDM  Xrays no fracture.   Pt placed in aso and given crutches.   I advised follow up with Orthopaedist for recheck  Pt given rx for ibuprofen and hydrocodone for pain      Lonia Skinner [place], [place] 10/10/12 0833  Elson Areas, [place] 10/10/12 [phone]

## 2012-10-10 NOTE — ED Notes (Signed)
Foot elevated and ice applied.

## 2012-10-10 NOTE — ED Notes (Addendum)
Otho called for wrap to right ankle and crutches. Pt then discharged to home with family and voiced understanding of discharge instructions and prescriptions. NAD.

## 2012-10-10 NOTE — ED Provider Notes (Signed)
Medical screening examination/treatment/procedure(s) were performed by non-physician practitioner and as supervising physician I was immediately available for consultation/collaboration.  Cheri Guppy, MD 10/10/12 1241

## 2012-10-13 ENCOUNTER — Encounter: Payer: Self-pay | Admitting: Internal Medicine

## 2012-10-13 ENCOUNTER — Ambulatory Visit (INDEPENDENT_AMBULATORY_CARE_PROVIDER_SITE_OTHER): Payer: Medicaid Other | Admitting: Internal Medicine

## 2012-10-13 VITALS — BP 128/87 | HR 77 | Temp 98.0°F | Wt 177.0 lb

## 2012-10-13 DIAGNOSIS — R6889 Other general symptoms and signs: Secondary | ICD-10-CM

## 2012-10-13 DIAGNOSIS — R928 Other abnormal and inconclusive findings on diagnostic imaging of breast: Secondary | ICD-10-CM

## 2012-10-13 DIAGNOSIS — IMO0001 Reserved for inherently not codable concepts without codable children: Secondary | ICD-10-CM

## 2012-10-13 DIAGNOSIS — Z23 Encounter for immunization: Secondary | ICD-10-CM

## 2012-10-13 NOTE — Progress Notes (Signed)
HIV CLINIC VISIT  RFV: routine visit Subjective:    Patient ID: Abigail Medina, female    DOB: 1966-12-14, 46 y.o.   MRN: [id number]  HPI 46 yo Female with HIV, CD 270/ VL <20, on truvada and raltegravir. Also has hx of CVA with left sided hemiparesis in 2012.  Sprained right ankle 5 days ago, went to ED, rule out any fracture. Now has ankle brace and a crutch to help with symptoms.  Followed up with allergist regarding angio edema from lisinopril.involving lips, face, jaw, eye swelling. No recent swelling since discontinuation of lisinopril ( this is not a new problem < 12 months)  Still participating in water aerobics 3x per week to improve conditioning, coordination, stroke rehab.   Current Outpatient Prescriptions on File Prior to Visit  Medication Sig Dispense Refill  . aspirin 81 MG chewable tablet Chew 81 mg by mouth daily.      Marland Kitchen atenolol (TENORMIN) 50 MG tablet Take 50 mg by mouth daily.      Marland Kitchen emtricitabine-tenofovir (TRUVADA) 200-300 MG per tablet Take 1 tablet by mouth daily.  30 tablet  6  . famotidine (PEPCID) 20 MG tablet Take 1 tablet (20 mg total) by mouth daily.  30 tablet  1  . gabapentin (NEURONTIN) 300 MG capsule Take 300 mg by mouth 3 (three) times daily.      . hydrochlorothiazide (HYDRODIURIL) 25 MG tablet TAKE 1 TABLET BY MOUTH EVERY DAY  30 tablet  6  . HYDROcodone-acetaminophen (NORCO/VICODIN) 5-325 MG per tablet Take 2 tablets by mouth every 4 (four) hours as needed for pain.  10 tablet  0  . hydrOXYzine (ATARAX/VISTARIL) 25 MG tablet Take 25 mg by mouth at bedtime.      Marland Kitchen ibuprofen (ADVIL,MOTRIN) 800 MG tablet Take 1 tablet (800 mg total) by mouth 3 (three) times daily.  21 tablet  0  . loratadine (CLARITIN) 10 MG tablet Take 1 tablet (10 mg total) by mouth daily.  30 tablet  6  . raltegravir (ISENTRESS) 400 MG tablet Take 400 mg by mouth 2 (two) times daily.      . simvastatin (ZOCOR) 20 MG tablet Take 20 mg by mouth at bedtime.        Marland Kitchen DISCONTD: lisinopril  (PRINIVIL,ZESTRIL) 10 MG tablet TAKE 1 TABLET BY MOUTH EVERY DAY  30 tablet  2     Review of Systems 10 point ROS negative, with exception to right ankle tenderness when ambulating    Objective:   Physical Exam BP 128/87  Pulse 77  Temp 98 F (36.7 C) (Oral)  Wt 177 lb (80.287 kg) Physical Exam  Constitutional:oriented to person, place, and time.  appears well-developed and well-nourished. No distress.  HENT:  Mouth/Throat: Oropharynx is clear and moist. No oropharyngeal exudate.  Cardiovascular: Normal rate, regular rhythm and normal heart sounds. Exam reveals no gallop and no friction rub.  No murmur heard.  Pulmonary/Chest: Effort normal and breath sounds normal. No respiratory distress.  no wheezes.  Abdominal: Soft. Bowel sounds are normal.  exhibits no distension. There is no tenderness.  Lymphadenopathy:  no cervical adenopathy.  Skin: Skin is warm and dry. No rash noted. No erythema.   neuro: mild facial droop. Left hand grip -4    Assessment & Plan:  HIV= will continue on truvada and raltegravir. Doing well with adherence  Health maintenance = flu shot today  Women's health = [facility]; and mammogram.  rtc in Jan, labs at that time.

## 2012-11-18 ENCOUNTER — Other Ambulatory Visit: Payer: Self-pay | Admitting: Infectious Diseases

## 2013-01-04 ENCOUNTER — Other Ambulatory Visit: Payer: Self-pay | Admitting: *Deleted

## 2013-01-04 DIAGNOSIS — B20 Human immunodeficiency virus [HIV] disease: Secondary | ICD-10-CM

## 2013-01-12 ENCOUNTER — Other Ambulatory Visit: Payer: Medicaid Other

## 2013-01-14 ENCOUNTER — Other Ambulatory Visit (INDEPENDENT_AMBULATORY_CARE_PROVIDER_SITE_OTHER): Payer: Medicaid Other

## 2013-01-14 DIAGNOSIS — Z113 Encounter for screening for infections with a predominantly sexual mode of transmission: Secondary | ICD-10-CM

## 2013-01-14 DIAGNOSIS — B20 Human immunodeficiency virus [HIV] disease: Secondary | ICD-10-CM

## 2013-01-14 LAB — COMPLETE METABOLIC PANEL WITH GFR
Alkaline Phosphatase: 65 U/L (ref 39–117)
BUN: 12 mg/dL (ref 6–23)
GFR, Est Non African American: 86 mL/min
Glucose, Bld: 84 mg/dL (ref 70–99)
Sodium: 141 mEq/L (ref 135–145)
Total Bilirubin: 0.8 mg/dL (ref 0.3–1.2)
Total Protein: 7.1 g/dL (ref 6.0–8.3)

## 2013-01-14 LAB — CBC WITH DIFFERENTIAL/PLATELET
Basophils Absolute: 0 10*3/uL (ref 0.0–0.1)
Basophils Relative: 0 % (ref 0–1)
Eosinophils Absolute: 0.1 10*3/uL (ref 0.0–0.7)
Hemoglobin: 13.8 g/dL (ref 12.0–15.0)
MCH: 30.1 pg (ref 26.0–34.0)
MCHC: 34 g/dL (ref 30.0–36.0)
Monocytes Relative: 11 % (ref 3–12)
Neutrophils Relative %: 32 % — ABNORMAL LOW (ref 43–77)
RDW: 13.6 % (ref 11.5–15.5)

## 2013-01-14 LAB — RPR

## 2013-01-15 LAB — HIV-1 RNA QUANT-NO REFLEX-BLD: HIV 1 RNA Quant: <20 copies/mL (ref <20)

## 2013-01-15 LAB — T-HELPER CELL (CD4) - (RCID CLINIC ONLY): CD4 % Helper T Cell: 21 % — ABNORMAL LOW (ref 33–55)

## 2013-01-26 ENCOUNTER — Ambulatory Visit (INDEPENDENT_AMBULATORY_CARE_PROVIDER_SITE_OTHER): Payer: Medicaid Other | Admitting: Internal Medicine

## 2013-01-26 ENCOUNTER — Encounter: Payer: Self-pay | Admitting: Internal Medicine

## 2013-01-26 VITALS — BP 131/97 | HR 66 | Temp 97.7°F | Ht 64.0 in | Wt 168.0 lb

## 2013-01-26 DIAGNOSIS — I10 Essential (primary) hypertension: Secondary | ICD-10-CM

## 2013-01-26 DIAGNOSIS — E785 Hyperlipidemia, unspecified: Secondary | ICD-10-CM

## 2013-01-26 DIAGNOSIS — B20 Human immunodeficiency virus [HIV] disease: Secondary | ICD-10-CM

## 2013-01-26 NOTE — Progress Notes (Signed)
[facility] HIV NOTE  RFV: routine visit Subjective:    Patient ID: Abigail Medina, female    DOB: November 05, 1966, 47 y.o.   MRN: [id number]  HPI 47yo F with HIV, CD 4 count 330/ VL < 20 ( in Jan 2014), on truvada/RAL no missing doses. She had a cold 2-3 wks had cough, productive with nasal congestion and mild fever but now improved. Feels like her phlegm is thick. Otherwise improving.  Had sex with her new partner, HIV-, when condom broke. Worried about risk of transmissions.  She hasn't had a period since may 2013, no menses in 10 months    Current Outpatient Prescriptions on File Prior to Visit  Medication Sig Dispense Refill  . aspirin 81 MG chewable tablet Chew 81 mg by mouth daily.      Marland Kitchen atenolol (TENORMIN) 50 MG tablet Take 50 mg by mouth daily.      Marland Kitchen atenolol (TENORMIN) 50 MG tablet TAKE 1 TABLET BY MOUTH DAILY.  30 tablet  2  . emtricitabine-tenofovir (TRUVADA) 200-300 MG per tablet Take 1 tablet by mouth daily.  30 tablet  6  . gabapentin (NEURONTIN) 300 MG capsule Take 300 mg by mouth 3 (three) times daily.      . hydrochlorothiazide (HYDRODIURIL) 25 MG tablet TAKE 1 TABLET BY MOUTH EVERY DAY  30 tablet  6  . hydrOXYzine (ATARAX/VISTARIL) 25 MG tablet Take 25 mg by mouth at bedtime.      Marland Kitchen ibuprofen (ADVIL,MOTRIN) 800 MG tablet Take 1 tablet (800 mg total) by mouth 3 (three) times daily.  21 tablet  0  . loratadine (CLARITIN) 10 MG tablet Take 1 tablet (10 mg total) by mouth daily.  30 tablet  6  . raltegravir (ISENTRESS) 400 MG tablet Take 400 mg by mouth 2 (two) times daily.      . simvastatin (ZOCOR) 20 MG tablet Take 20 mg by mouth at bedtime.        . famotidine (PEPCID) 20 MG tablet Take 1 tablet (20 mg total) by mouth daily.  30 tablet  1  . [DISCONTINUED] lisinopril (PRINIVIL,ZESTRIL) 10 MG tablet TAKE 1 TABLET BY MOUTH EVERY DAY  30 tablet  2   No current facility-administered medications on file prior to visit.   Active Ambulatory Problems    Diagnosis Date Noted  . HIV  DISEASE 08/06/2007  . THRUSH 12/28/2009  . NEVUS 08/29/2010  . HYPERTENSION 08/06/2007  . ACUTE BRONCHITIS 01/19/2009  . AMENORRHEA 07/19/2010  . CELLULITIS AND ABSCESS OF UNSPECIFIED SITE 05/21/2010  . SKIN RASH 05/21/2010  . VAGINAL DISCHARGE 12/26/2010  . 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 Seborrheic dermatitis 03/07/2011  . Folliculitis 04/18/2011  . Seborrhea capitis 04/18/2011  . Depression with anxiety 07/15/2011  . Insomnia 07/15/2011  . Chronic sinusitis 10/08/2011  . Sinusitis 10/08/2011   Resolved Ambulatory Problems    Diagnosis Date Noted  . No Resolved Ambulatory Problems   No Additional Past Medical History    Social hx: has been with new partner, for the past month, who is a  Childhood friend. Her 18yo son applying colleges now.   Review of Systems     Objective:   Physical Exam BP 131/97  Pulse 66  Temp(Src) 97.7 F (36.5 C) (Oral)  Ht 5\' 4"  (1.626 m)  Wt 168 lb (76.204 kg)  BMI 28.82 kg/m2 Physical Exam  Constitutional: oriented to person, place, and time.  appears well-developed and well-nourished. No distress.  HENT:  Mouth/Throat: Oropharynx is clear and  moist. No oropharyngeal exudate.  Cardiovascular: Normal rate, regular rhythm and normal heart sounds. Exam reveals no gallop and no friction rub.  No murmur heard.  Pulmonary/Chest: Effort normal and breath sounds normal. No respiratory distress.  no wheezes.  Abdominal: Soft. Bowel sounds are normal. exhibits no distension. There is no tenderness.  Lymphadenopathy:  no cervical adenopathy.  Skin: Skin is warm and dry. No rash noted. No erythema.  Psychiatric:good mood, upbeat         Assessment & Plan:  hiv = continue with excellent adherence with truvada/RAL  HTN= well controlled  Hyperlipidemia= doing well.  Health maintenance =will need pap and mammo in the next < 3months., urine screen for GC/chlam

## 2013-01-27 ENCOUNTER — Other Ambulatory Visit: Payer: Self-pay | Admitting: Licensed Clinical Social Worker

## 2013-01-27 DIAGNOSIS — B20 Human immunodeficiency virus [HIV] disease: Secondary | ICD-10-CM

## 2013-01-27 MED ORDER — EMTRICITABINE-TENOFOVIR DF 200-300 MG PO TABS: 1.0000 | ORAL_TABLET | Freq: Every day | ORAL | Status: DC

## 2013-01-31 ENCOUNTER — Other Ambulatory Visit: Payer: Self-pay | Admitting: Infectious Diseases

## 2013-02-02 ENCOUNTER — Other Ambulatory Visit (HOSPITAL_COMMUNITY)
Admission: RE | Admit: 2013-02-02 | Discharge: 2013-02-02 | Disposition: A | Discharge status: Home / Self Care | Payer: Medicaid Other | Source: Ambulatory Visit | Attending: Infectious Disease | Admitting: Infectious Disease

## 2013-02-02 ENCOUNTER — Ambulatory Visit: Payer: Medicaid Other | Admitting: *Deleted

## 2013-02-02 DIAGNOSIS — Z124 Encounter for screening for malignant neoplasm of cervix: Secondary | ICD-10-CM

## 2013-02-02 DIAGNOSIS — Z01419 Encounter for gynecological examination (general) (routine) without abnormal findings: Secondary | ICD-10-CM | POA: Insufficient documentation

## 2013-02-02 DIAGNOSIS — Z0000 Encounter for general adult medical examination without abnormal findings: Secondary | ICD-10-CM

## 2013-02-02 NOTE — Progress Notes (Signed)
  Subjective:     Abigail Medina is a 48 y.o. woman who comes in today for a  pap smear only. Contraception:  condoms  Objective:    There were no vitals taken for this visit. Pelvic Exam: Pap smear obtained.   Assessment:    Screening pap smear.   Plan:    Follow up in one year, or as indicated by Pap results.  Pt given educational materials re: ACA, PAP smears, heart disease and HIV, partner protection, SBE and self-esteem. Pt given condoms and lubricant.

## 2013-02-02 NOTE — Patient Instructions (Signed)
Your results will be ready in about a week.  You can find them on MyChart.  Thank you for coming to the Center for your care.  If you need to be referred based on the results I will be in touch.  Angelique Blonder, RN

## 2013-02-04 ENCOUNTER — Ambulatory Visit (HOSPITAL_COMMUNITY)
Admission: RE | Admit: 2013-02-04 | Discharge: 2013-02-04 | Disposition: A | Discharge status: Home / Self Care | Payer: Medicaid Other | Source: Ambulatory Visit | Attending: Internal Medicine | Admitting: Internal Medicine

## 2013-02-04 DIAGNOSIS — Z0000 Encounter for general adult medical examination without abnormal findings: Secondary | ICD-10-CM

## 2013-02-04 DIAGNOSIS — Z1231 Encounter for screening mammogram for malignant neoplasm of breast: Secondary | ICD-10-CM | POA: Insufficient documentation

## 2013-02-09 ENCOUNTER — Emergency Department (HOSPITAL_COMMUNITY): Payer: Medicaid Other

## 2013-02-09 ENCOUNTER — Encounter (HOSPITAL_COMMUNITY): Payer: Self-pay | Admitting: *Deleted

## 2013-02-09 ENCOUNTER — Emergency Department (HOSPITAL_COMMUNITY)
Admission: EM | Admit: 2013-02-09 | Discharge: 2013-02-10 | Disposition: A | Discharge status: Home / Self Care | Payer: Medicaid Other | Attending: Emergency Medicine | Admitting: Emergency Medicine

## 2013-02-09 DIAGNOSIS — Z7982 Long term (current) use of aspirin: Secondary | ICD-10-CM | POA: Insufficient documentation

## 2013-02-09 DIAGNOSIS — Z79899 Other long term (current) drug therapy: Secondary | ICD-10-CM | POA: Insufficient documentation

## 2013-02-09 DIAGNOSIS — Y92410 Unspecified street and highway as the place of occurrence of the external cause: Secondary | ICD-10-CM | POA: Insufficient documentation

## 2013-02-09 DIAGNOSIS — Z87891 Personal history of nicotine dependence: Secondary | ICD-10-CM | POA: Insufficient documentation

## 2013-02-09 DIAGNOSIS — S82401A Unspecified fracture of shaft of right fibula, initial encounter for closed fracture: Secondary | ICD-10-CM

## 2013-02-09 DIAGNOSIS — Y9389 Activity, other specified: Secondary | ICD-10-CM | POA: Insufficient documentation

## 2013-02-09 DIAGNOSIS — S82899A Other fracture of unspecified lower leg, initial encounter for closed fracture: Secondary | ICD-10-CM | POA: Insufficient documentation
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
## 2013-02-10 MED ORDER — OXYCODONE-ACETAMINOPHEN 5-325 MG PO TABS
1.0000 | ORAL_TABLET | Freq: Once | ORAL | Status: AC
  Administered 2013-02-10: 1 via ORAL
  Filled 2013-02-10: qty 1

## 2013-02-10 MED ORDER — HYDROCODONE-ACETAMINOPHEN 5-325 MG PO TABS: 1.0000 | ORAL_TABLET | ORAL | Status: DC | PRN

## 2013-02-10 NOTE — ED Notes (Signed)
Ortho paged. 
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
## 2013-02-10 NOTE — Progress Notes (Signed)
Orthopedic Tech Progress Note Patient Details:  Abigail Medina 04/04/1966 [id number]  Ortho Devices Type of Ortho Device: CAM walker   Haskell Flirt 02/10/2013, 1:29 AM

## 2013-02-11 NOTE — ED Provider Notes (Signed)
Medical screening examination/treatment/procedure(s) were performed by non-physician practitioner and as supervising physician I was immediately available for consultation/collaboration.   Lyanne Co, MD 02/11/13 [phone]

## 2013-02-24 ENCOUNTER — Other Ambulatory Visit: Payer: Self-pay | Admitting: Licensed Clinical Social Worker

## 2013-02-24 DIAGNOSIS — B20 Human immunodeficiency virus [HIV] disease: Secondary | ICD-10-CM

## 2013-02-24 MED ORDER — RALTEGRAVIR POTASSIUM 400 MG PO TABS: 400.0000 mg | ORAL_TABLET | Freq: Two times a day (BID) | ORAL | Status: DC

## 2013-03-04 ENCOUNTER — Other Ambulatory Visit: Payer: Self-pay | Admitting: Infectious Diseases

## 2013-03-08 ENCOUNTER — Other Ambulatory Visit: Payer: Self-pay | Admitting: *Deleted

## 2013-03-08 DIAGNOSIS — I10 Essential (primary) hypertension: Secondary | ICD-10-CM

## 2013-03-08 MED ORDER — ATENOLOL 50 MG PO TABS: 50.0000 mg | ORAL_TABLET | Freq: Every day | ORAL | Status: DC

## 2013-04-06 ENCOUNTER — Telehealth: Payer: Self-pay | Admitting: *Deleted

## 2013-04-06 NOTE — Telephone Encounter (Signed)
Message copied by Salome Spotted on Tue Apr 06, 2013  5:31 PM ------      Message from: Warren Lacy A      Created: Tue Apr 06, 2013  2:42 PM      Contact: Haisley Arens called and wants to schedule a appointment with Dr. [name] but there was nothing available until September or October but she is having pain with her hands and she wants a nurse to call her back to see what she needs to do. Can you please call her at [phone]            Thanks ------

## 2013-04-06 NOTE — Telephone Encounter (Signed)
Patient states she is having a lot of pain in her hands that's making it hard for her to do anything. Her hand gets stuck and she has to have someone pull it out for her. Patient has taken hydrocodone In with the gabapentin and that's the only thing that finds that helps her. But is will to try anything she can no longer stand the pain.  Please advise.

## 2013-04-07 NOTE — Telephone Encounter (Signed)
Try topamax 50 mg hs x 1 week then twice daily and needs f/u appointment with Larita Fife. Nurse practitioner

## 2013-04-08 ENCOUNTER — Other Ambulatory Visit: Payer: Self-pay

## 2013-04-08 MED ORDER — TOPIRAMATE 50 MG PO TABS: ORAL_TABLET | ORAL | Status: DC

## 2013-04-08 NOTE — Telephone Encounter (Signed)
Per Dr [name] last note: Call Documentation    Micki Riley, MD at 04/07/2013 5:26 PM    Status: Signed             Try topamax 50 mg hs x 1 week then twice daily and needs f/u appointment with Larita Fife. Nurse practitioner

## 2013-04-13 NOTE — Telephone Encounter (Signed)
I called pt f/u on message.   She had started taking topamax 50mg  po qhs and will increase to bid this next week.  She has had some relief.  Hopefully this will continue.

## 2013-04-26 ENCOUNTER — Other Ambulatory Visit: Payer: Medicaid Other

## 2013-05-11 ENCOUNTER — Ambulatory Visit: Payer: Medicaid Other | Admitting: Internal Medicine

## 2013-05-25 DIAGNOSIS — D72819 Decreased white blood cell count, unspecified: Secondary | ICD-10-CM | POA: Insufficient documentation

## 2013-05-25 DIAGNOSIS — J3489 Other specified disorders of nose and nasal sinuses: Secondary | ICD-10-CM | POA: Insufficient documentation

## 2013-05-25 DIAGNOSIS — J309 Allergic rhinitis, unspecified: Secondary | ICD-10-CM | POA: Insufficient documentation

## 2013-06-03 ENCOUNTER — Other Ambulatory Visit: Payer: Medicaid Other

## 2013-06-03 ENCOUNTER — Other Ambulatory Visit (HOSPITAL_COMMUNITY)
Admission: RE | Admit: 2013-06-03 | Discharge: 2013-06-03 | Disposition: A | Discharge status: Home / Self Care | Payer: Medicaid Other | Source: Ambulatory Visit | Attending: Internal Medicine | Admitting: Internal Medicine

## 2013-06-03 DIAGNOSIS — Z79899 Other long term (current) drug therapy: Secondary | ICD-10-CM

## 2013-06-03 DIAGNOSIS — B20 Human immunodeficiency virus [HIV] disease: Secondary | ICD-10-CM

## 2013-06-03 DIAGNOSIS — Z113 Encounter for screening for infections with a predominantly sexual mode of transmission: Secondary | ICD-10-CM | POA: Insufficient documentation

## 2013-06-03 LAB — COMPREHENSIVE METABOLIC PANEL
ALT: 20 U/L (ref 0–35)
Albumin: 4 g/dL (ref 3.5–5.2)
Alkaline Phosphatase: 75 U/L (ref 39–117)
CO2: 25 mEq/L (ref 19–32)
Glucose, Bld: 81 mg/dL (ref 70–99)
Potassium: 3.9 mEq/L (ref 3.5–5.3)
Sodium: 140 mEq/L (ref 135–145)
Total Protein: 7.3 g/dL (ref 6.0–8.3)

## 2013-06-03 LAB — CBC WITH DIFFERENTIAL/PLATELET
Hemoglobin: 14.2 g/dL (ref 12.0–15.0)
Lymphs Abs: 1.4 10*3/uL (ref 0.7–4.0)
Monocytes Relative: 9 % (ref 3–12)
Neutro Abs: 1.1 10*3/uL — ABNORMAL LOW (ref 1.7–7.7)
Neutrophils Relative %: 37 % — ABNORMAL LOW (ref 43–77)
Platelets: 179 10*3/uL (ref 150–400)
RBC: 4.6 MIL/uL (ref 3.87–5.11)
WBC: 2.9 10*3/uL — ABNORMAL LOW (ref 4.0–10.5)

## 2013-06-03 LAB — LIPID PANEL
HDL: 47 mg/dL (ref >39)
LDL Cholesterol: 98 mg/dL (ref 0–99)
Total CHOL/HDL Ratio: 3.6 Ratio

## 2013-06-04 LAB — HIV-1 RNA QUANT-NO REFLEX-BLD
HIV 1 RNA Quant: <20 copies/mL (ref <20)
HIV-1 RNA Quant, Log: <1.3 {Log} (ref <1.30)

## 2013-06-04 LAB — T-HELPER CELL (CD4) - (RCID CLINIC ONLY)
CD4 % Helper T Cell: 23 % — ABNORMAL LOW (ref 33–55)
CD4 T Cell Abs: 300 uL — ABNORMAL LOW (ref 400–2700)

## 2013-06-17 ENCOUNTER — Ambulatory Visit: Payer: Medicaid Other | Admitting: Internal Medicine

## 2013-06-17 ENCOUNTER — Telehealth: Payer: Self-pay | Admitting: *Deleted

## 2013-06-17 NOTE — Telephone Encounter (Signed)
Patient called to reschedule her appt that she missed today. She was given an appt for 06/22/13. Reminded the patient that she has forms to pick up that can not be completed until she comes for a visit.

## 2013-06-22 ENCOUNTER — Encounter: Payer: Self-pay | Admitting: Internal Medicine

## 2013-06-22 ENCOUNTER — Ambulatory Visit (INDEPENDENT_AMBULATORY_CARE_PROVIDER_SITE_OTHER): Payer: Medicaid Other | Admitting: Internal Medicine

## 2013-06-22 VITALS — BP 109/76 | HR 96 | Temp 98.0°F | Wt 164.0 lb

## 2013-06-22 DIAGNOSIS — B20 Human immunodeficiency virus [HIV] disease: Secondary | ICD-10-CM
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
## 2013-06-23 ENCOUNTER — Telehealth: Payer: Self-pay | Admitting: *Deleted

## 2013-06-23 NOTE — Telephone Encounter (Signed)
Pt walked in to day asking for form dropped off 06-03-13. It is a Programme researcher, broadcasting/film/video form from [employer].   Her appt is today at lawyers office.   I called and told her that we donot do these forms.  Unfortunately this was not caught until today.  I offered her money back and records (MRA's, NCS, ofv note 03-28-13) from [employer] no charge.  Instructed front desk when she comes in to p/u that she needs appt with Dr. Marlis Edelson NP.

## 2013-06-23 NOTE — Telephone Encounter (Signed)
I agree

## 2013-08-06 ENCOUNTER — Other Ambulatory Visit: Payer: Self-pay | Admitting: Internal Medicine

## 2013-09-20 ENCOUNTER — Other Ambulatory Visit: Payer: Self-pay | Admitting: *Deleted

## 2013-09-20 ENCOUNTER — Other Ambulatory Visit: Payer: Medicaid Other

## 2013-09-20 DIAGNOSIS — B20 Human immunodeficiency virus [HIV] disease: Secondary | ICD-10-CM

## 2013-10-05 ENCOUNTER — Ambulatory Visit: Payer: Medicaid Other | Admitting: Internal Medicine

## 2013-10-11 ENCOUNTER — Ambulatory Visit: Payer: Medicaid Other | Admitting: Internal Medicine

## 2013-10-19 ENCOUNTER — Ambulatory Visit: Payer: Medicaid Other | Admitting: Internal Medicine

## 2013-10-21 ENCOUNTER — Other Ambulatory Visit: Payer: Self-pay

## 2013-11-16 ENCOUNTER — Other Ambulatory Visit: Payer: Medicaid Other

## 2013-11-16 DIAGNOSIS — B20 Human immunodeficiency virus [HIV] disease: Secondary | ICD-10-CM

## 2013-11-16 LAB — COMPLETE METABOLIC PANEL WITH GFR
ALT: 17 U/L (ref 0–35)
AST: 20 U/L (ref 0–37)
Alkaline Phosphatase: 70 U/L (ref 39–117)
BUN: 10 mg/dL (ref 6–23)
Calcium: 9.3 mg/dL (ref 8.4–10.5)
Chloride: 105 mEq/L (ref 96–112)
Creat: 0.78 mg/dL (ref 0.50–1.10)

## 2013-11-16 LAB — CBC WITH DIFFERENTIAL/PLATELET
Eosinophils Absolute: 0.1 10*3/uL (ref 0.0–0.7)
Eosinophils Relative: 2 % (ref 0–5)
HCT: 41.9 % (ref 36.0–46.0)
Hemoglobin: 14.6 g/dL (ref 12.0–15.0)
Lymphocytes Relative: 43 % (ref 12–46)
Lymphs Abs: 1.3 10*3/uL (ref 0.7–4.0)
MCH: 31 pg (ref 26.0–34.0)
MCV: 89 fL (ref 78.0–100.0)
Monocytes Absolute: 0.3 10*3/uL (ref 0.1–1.0)
Monocytes Relative: 9 % (ref 3–12)
Platelets: 161 10*3/uL (ref 150–400)
RBC: 4.71 MIL/uL (ref 3.87–5.11)
WBC: 2.9 10*3/uL — ABNORMAL LOW (ref 4.0–10.5)

## 2013-11-17 LAB — T-HELPER CELL (CD4) - (RCID CLINIC ONLY)
CD4 % Helper T Cell: 24 % — ABNORMAL LOW (ref 33–55)
CD4 T Cell Abs: 350 /uL — ABNORMAL LOW (ref 400–2700)

## 2013-11-30 ENCOUNTER — Ambulatory Visit (INDEPENDENT_AMBULATORY_CARE_PROVIDER_SITE_OTHER): Payer: Medicaid Other | Admitting: Internal Medicine

## 2013-11-30 ENCOUNTER — Encounter: Payer: Self-pay | Admitting: Internal Medicine

## 2013-11-30 VITALS — BP 129/90 | HR 68 | Temp 97.7°F | Wt 169.0 lb

## 2013-11-30 DIAGNOSIS — Z23 Encounter for immunization: Secondary | ICD-10-CM

## 2013-11-30 DIAGNOSIS — G4700 Insomnia, unspecified: Secondary | ICD-10-CM

## 2013-11-30 MED ORDER — TRAZODONE HCL 50 MG PO TABS: 25.0000 mg | ORAL_TABLET | Freq: Every day | ORAL | Status: DC

## 2013-11-30 MED ORDER — TRAZODONE 25 MG HALF TABLET: 25.0000 mg | ORAL_TABLET | Freq: Every evening | ORAL | Status: DC | PRN

## 2013-11-30 NOTE — Progress Notes (Signed)
Subjective:    Patient ID: Abigail Medina, female    DOB: 1966-01-21, 47 y.o.   MRN: [id number]  HPI 47yo F with HIV, CD 4 count 350/VL<20 (dec 2014), on RLG/truvada. CVA with left hemiparesis and numbness. Has been doing well with her hiv medications. Feels that very stressed out recently, having increasing anxiety. Dealing with disability and moving out their house. Financial stressors.   ROS: Left eye, blurry, loss of vision. First occurred in summer, then last week. Notices zigzagging lines. Lasting up to 10 minutes. She she lays down/uses  Compresses. Vision returns to baseline. Insomnia due to anxiety = poor sleep.not going to bed til 1 am, and wakes up at 4am. Runny nose = uses flonase and claritin that she uses PRN Sharp chest pain lasting a few seconds, 2 days ago. She thinks it associated with panic disorder Numbness from stroke  Current Outpatient Prescriptions on File Prior to Visit  Medication Sig Dispense Refill  . aspirin 81 MG chewable tablet Chew 81 mg by mouth daily.      Marland Kitchen atenolol (TENORMIN) 50 MG tablet TAKE 1 TABLET BY MOUTH EVERY DAY  30 tablet  3  . emtricitabine-tenofovir (TRUVADA) 200-300 MG per tablet Take 1 tablet by mouth daily.  30 tablet  6  . fluticasone (FLONASE) 50 MCG/ACT nasal spray Place 2 sprays into the nose daily.      . hydrochlorothiazide (HYDRODIURIL) 25 MG tablet TAKE 1 TABLET BY MOUTH EVERY DAY  30 tablet  6  . HYDROcodone-acetaminophen (NORCO/VICODIN) 5-325 MG per tablet Take 1 tablet by mouth every 4 (four) hours as needed for pain.  20 tablet  0  . hydrOXYzine (ATARAX/VISTARIL) 25 MG tablet Take 25 mg by mouth at bedtime.      Marland Kitchen loratadine (CLARITIN) 10 MG tablet Take 10 mg by mouth every other day.      . raltegravir (ISENTRESS) 400 MG tablet Take 1 tablet (400 mg total) by mouth 2 (two) times daily.  60 tablet  6  . simvastatin (ZOCOR) 20 MG tablet Take 20 mg by mouth at bedtime.        . topiramate (TOPAMAX) 50 MG tablet one tablet po hs  for one week then increase to one tablet bid  60 tablet  1  . famotidine (PEPCID) 20 MG tablet Take 1 tablet (20 mg total) by mouth daily.  30 tablet  1  . [DISCONTINUED] lisinopril (PRINIVIL,ZESTRIL) 10 MG tablet TAKE 1 TABLET BY MOUTH EVERY DAY  30 tablet  2   No current facility-administered medications on file prior to visit.   Active Ambulatory Problems    Diagnosis Date Noted  . HIV DISEASE 08/06/2007  . THRUSH 12/28/2009  . NEVUS 08/29/2010  . HYPERTENSION 08/06/2007  . ACUTE BRONCHITIS 01/19/2009  . AMENORRHEA 07/19/2010  . CELLULITIS AND ABSCESS OF UNSPECIFIED SITE 05/21/2010  . SKIN RASH 05/21/2010  . VAGINAL DISCHARGE 12/26/2010  . 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 Seborrheic dermatitis 03/07/2011  . Folliculitis 04/18/2011  . Seborrhea capitis 04/18/2011  . Depression with anxiety 07/15/2011  . Insomnia 07/15/2011  . Chronic sinusitis 10/08/2011  . Sinusitis 10/08/2011   Resolved Ambulatory Problems    Diagnosis Date Noted  . No Resolved Ambulatory Problems   No Additional Past Medical History      Review of Systems Listed above    Objective:   Physical Exam  BP 129/90  Pulse 68  Temp(Src) 97.7 F (36.5 C) (Oral)  Wt 169 lb (76.658 kg)  Physical Exam  Constitutional:  oriented to person, place, and time.  appears well-developed and well-nourished. No distress.  HENT:  Mouth/Throat: Oropharynx is clear and moist. No oropharyngeal exudate.  Cardiovascular: Normal rate, regular rhythm and normal heart sounds. Exam reveals no gallop and no friction rub.  No murmur heard.  Pulmonary/Chest: Effort normal and breath sounds normal. No respiratory distress.  no wheezes.  Lymphadenopathy:  no cervical adenopathy.  Neurological:  alert and oriented to person, place, and time.  Skin: Skin is warm and dry. No rash noted. No erythema.  Psychiatric:  a normal mood and affect.  behavior is normal.       Assessment & Plan:  hiv = well controlled    Anxiety = situational due to financial stressor  Insomnia = poor sleep hygiene. Will give trazodone  Visual compliants = refer to ophtho  Numbness/peripheral neuropathy  = followed by [facility]

## 2013-12-20 ENCOUNTER — Other Ambulatory Visit: Payer: Self-pay | Admitting: *Deleted

## 2013-12-20 DIAGNOSIS — B20 Human immunodeficiency virus [HIV] disease: Secondary | ICD-10-CM

## 2013-12-20 MED ORDER — RALTEGRAVIR POTASSIUM 400 MG PO TABS: 400.0000 mg | ORAL_TABLET | Freq: Two times a day (BID) | ORAL | Status: DC

## 2013-12-20 MED ORDER — EMTRICITABINE-TENOFOVIR DF 200-300 MG PO TABS: 1.0000 | ORAL_TABLET | Freq: Every day | ORAL | Status: DC

## 2014-02-06 ENCOUNTER — Other Ambulatory Visit: Payer: Self-pay | Admitting: Internal Medicine

## 2014-02-18 ENCOUNTER — Telehealth: Payer: Self-pay | Admitting: *Deleted

## 2014-02-18 NOTE — Telephone Encounter (Signed)
Pt needs to make PAP smear appt.

## 2014-02-28 ENCOUNTER — Other Ambulatory Visit: Payer: Medicaid Other

## 2014-03-10 ENCOUNTER — Ambulatory Visit (INDEPENDENT_AMBULATORY_CARE_PROVIDER_SITE_OTHER): Payer: Medicaid Other | Admitting: Internal Medicine

## 2014-03-10 ENCOUNTER — Other Ambulatory Visit: Payer: Self-pay | Admitting: *Deleted

## 2014-03-10 ENCOUNTER — Encounter: Payer: Self-pay | Admitting: Internal Medicine

## 2014-03-10 VITALS — BP 143/102 | HR 68 | Temp 97.8°F | Wt 174.0 lb

## 2014-03-10 DIAGNOSIS — B20 Human immunodeficiency virus [HIV] disease: Secondary | ICD-10-CM

## 2014-03-10 DIAGNOSIS — M5380 Other specified dorsopathies, site unspecified: Secondary | ICD-10-CM

## 2014-03-10 DIAGNOSIS — M62830 Muscle spasm of back: Secondary | ICD-10-CM

## 2014-03-10 DIAGNOSIS — Z21 Asymptomatic human immunodeficiency virus [HIV] infection status: Secondary | ICD-10-CM

## 2014-03-10 LAB — COMPLETE METABOLIC PANEL WITH GFR
ALT: 25 U/L (ref 0–35)
AST: 25 U/L (ref 0–37)
Albumin: 3.8 g/dL (ref 3.5–5.2)
Alkaline Phosphatase: 81 U/L (ref 39–117)
BILIRUBIN TOTAL: 1 mg/dL (ref 0.2–1.2)
BUN: 7 mg/dL (ref 6–23)
CO2: 27 meq/L (ref 19–32)
CREATININE: 0.84 mg/dL (ref 0.50–1.10)
Calcium: 8.7 mg/dL (ref 8.4–10.5)
Chloride: 104 mEq/L (ref 96–112)
GFR, EST NON AFRICAN AMERICAN: 81 mL/min
Glucose, Bld: 86 mg/dL (ref 70–99)
Potassium: 4.2 mEq/L (ref 3.5–5.3)
Sodium: 139 mEq/L (ref 135–145)
Total Protein: 6.7 g/dL (ref 6.0–8.3)

## 2014-03-10 LAB — CBC WITH DIFFERENTIAL/PLATELET
Basophils Absolute: 0 10*3/uL (ref 0.0–0.1)
Basophils Relative: 0 % (ref 0–1)
EOS ABS: 0.1 10*3/uL (ref 0.0–0.7)
EOS PCT: 3 % (ref 0–5)
HCT: 40.4 % (ref 36.0–46.0)
Hemoglobin: 13.9 g/dL (ref 12.0–15.0)
LYMPHS ABS: 1.1 10*3/uL (ref 0.7–4.0)
Lymphocytes Relative: 42 % (ref 12–46)
MCH: 30.2 pg (ref 26.0–34.0)
MCHC: 34.4 g/dL (ref 30.0–36.0)
MCV: 87.8 fL (ref 78.0–100.0)
MONO ABS: 0.2 10*3/uL (ref 0.1–1.0)
Monocytes Relative: 8 % (ref 3–12)
Neutro Abs: 1.3 10*3/uL — ABNORMAL LOW (ref 1.7–7.7)
Neutrophils Relative %: 47 % (ref 43–77)
PLATELETS: 163 10*3/uL (ref 150–400)
RBC: 4.6 MIL/uL (ref 3.87–5.11)
RDW: 14.1 % (ref 11.5–15.5)
WBC: 2.7 10*3/uL — ABNORMAL LOW (ref 4.0–10.5)

## 2014-03-10 MED ORDER — CYCLOBENZAPRINE HCL 10 MG PO TABS: 10.0000 mg | ORAL_TABLET | Freq: Three times a day (TID) | ORAL | Status: DC | PRN

## 2014-03-10 MED ORDER — EMTRICITABINE-TENOFOVIR DF 200-300 MG PO TABS: 1.0000 | ORAL_TABLET | Freq: Every day | ORAL | Status: DC

## 2014-03-10 MED ORDER — DOLUTEGRAVIR SODIUM 50 MG PO TABS: 50.0000 mg | ORAL_TABLET | Freq: Every day | ORAL | Status: DC

## 2014-03-10 NOTE — Progress Notes (Signed)
Subjective:    Patient ID: Abigail Medina, female    DOB: [birth year], 48 y.o.   MRN: [id number]  HPI 48yo F with HIV, CD 4 count of  600/VL<20 on Truvada/RLG but commonly misses 2nd dose of RLG. She states she missed 3 doses last week. She states that it is very difficult for her to remember 2nd dose of medications. She states that she threw out her back which has caused her having difficulty with sleeping. She has taken pain medication and muscle relaxant to help her symptoms. Muscle relaxant have helped her the most. She states that she still feels low back is "tight" no weakness to lower extremities.  Current Outpatient Prescriptions on File Prior to Visit  Medication Sig Dispense Refill  . aspirin 81 MG chewable tablet Chew 81 mg by mouth daily.      Marland Kitchen atenolol (TENORMIN) 50 MG tablet TAKE 1 TABLET BY MOUTH EVERY DAY  30 tablet  3  . emtricitabine-tenofovir (TRUVADA) 200-300 MG per tablet Take 1 tablet by mouth daily.  30 tablet  3  . fluticasone (FLONASE) 50 MCG/ACT nasal spray Place 2 sprays into the nose daily.      . hydrochlorothiazide (HYDRODIURIL) 25 MG tablet TAKE 1 TABLET BY MOUTH EVERY DAY  30 tablet  6  . loratadine (CLARITIN) 10 MG tablet Take 10 mg by mouth every other day.      . topiramate (TOPAMAX) 50 MG tablet one tablet po hs for one week then increase to one tablet bid  60 tablet  1  . famotidine (PEPCID) 20 MG tablet Take 1 tablet (20 mg total) by mouth daily.  30 tablet  1  . HYDROcodone-acetaminophen (NORCO/VICODIN) 5-325 MG per tablet Take 1 tablet by mouth every 4 (four) hours as needed for pain.  20 tablet  0  . hydrOXYzine (ATARAX/VISTARIL) 25 MG tablet Take 25 mg by mouth at bedtime.      . simvastatin (ZOCOR) 20 MG tablet Take 20 mg by mouth at bedtime.        . traZODone (DESYREL) 50 MG tablet Take 0.5 tablets (25 mg total) by mouth at bedtime. May increase to 1 tablet if needed.  30 tablet  2  . [DISCONTINUED] lisinopril (PRINIVIL,ZESTRIL) 10 MG tablet TAKE 1  TABLET BY MOUTH EVERY DAY  30 tablet  2   No current facility-administered medications on file prior to visit.   Active Ambulatory Problems    Diagnosis Date Noted  . HIV DISEASE 08/06/2007  . THRUSH 12/28/2009  . NEVUS 08/29/2010  . HYPERTENSION 08/06/2007  . ACUTE BRONCHITIS 01/19/2009  . AMENORRHEA 07/19/2010  . CELLULITIS AND ABSCESS OF UNSPECIFIED SITE 05/21/2010  . SKIN RASH 05/21/2010  . VAGINAL DISCHARGE 12/26/2010  . 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 Seborrheic dermatitis 03/07/2011  . Folliculitis 54/98/2641  . Seborrhea capitis 04/18/2011  . Depression with anxiety 07/15/2011  . Insomnia 07/15/2011  . Chronic sinusitis 10/08/2011  . Sinusitis 10/08/2011   Resolved Ambulatory Problems    Diagnosis Date Noted  . No Resolved Ambulatory Problems   No Additional Past Medical History       Review of Systems     Objective:   Physical Exam BP 143/102  Pulse 68  Temp(Src) 97.8 F (36.6 C) (Oral)  Wt 174 lb (78.926 kg) Physical Exam  Constitutional:  oriented to person, place, and time. appears well-developed and well-nourished. No distress.  HENT:  Mouth/Throat: Oropharynx is clear and moist. No oropharyngeal exudate.  Cardiovascular: Normal  rate, regular rhythm and normal heart sounds. Exam reveals no gallop and no friction rub.  No murmur heard.  Pulmonary/Chest: Effort normal and breath sounds normal. No respiratory distress.  has no wheezes.  Abdominal: Soft. Bowel sounds are normal.  exhibits no distension. There is no tenderness.  Lymphadenopathy: no cervical adenopathy.  Back = firm paraspinal muscles of lower spine Neurological: alert and oriented to person, place, and time.  Skin: Skin is warm and dry. No rash noted. No erythema.  Psychiatric: a normal mood and affect. Hibehavior is normal.         Assessment & Plan:  hiv = concern for breakthrough since not taking rlg bid. Will change to tivicay daily plus truvuda  Muscle spasm = will  give rx for flexeril  Health maintenance = will check rpr

## 2014-03-10 NOTE — Telephone Encounter (Signed)
HIV medications needed to have gone to [facility].  [facility] at [facility].  Pt still picking Flex[facility]il up at [facility].  [facility].

## 2014-03-10 NOTE — Progress Notes (Signed)
  [facility] - Pharmacist    HPI: Abigail Medina is a 48 y.o. female here for a follow-up appointment for her HIV infection. Her labs from December 2014 show a CD4 count of 350 and HIV viral load <20, though she describes poor adherence so unsure as of her current viral suppression.  Allergies: Allergies  Allergen Reactions  . Lisinopril Swelling    Vitals: Temp: 97.8 F (36.6 C) (03/26 1017) Temp src: Oral (03/26 1017) BP: 143/102 mmHg (03/26 1017) Pulse Rate: 68 (03/26 1017)  Past Medical History: No past medical history on file.  Social History: History   Social History  . Marital Status: Single    Spouse Name: N/A    Number of Children: N/A  . Years of Education: N/A   Social History Main Topics  . Smoking status: Former Smoker    Quit date: 12/04/2010  . Smokeless tobacco: Never Used  . Alcohol Use: 1 - 1.5 oz/week    2-3 drink(s) per week     Comment: liquor or beer; stated has increased with social meetings  . Drug Use: No  . Sexual Activity: Yes    Partners: Male    Birth Control/ Protection: Condom     Comment: accepted condoms   Other Topics Concern  . None   Social History Narrative  . None    Current Regimen: Truvada and Isentress  Labs: HIV 1 RNA Quant (copies/mL)  Date Value  11/16/2013 <20   06/03/2013 <20   01/14/2013 <20      CD4 T Cell Abs (/uL)  Date Value  11/16/2013 350*  06/03/2013 300*  01/14/2013 330*     Hep B S Ab (no units)  Date Value  01/30/2007 NEGATIVE      Hepatitis B Surface Ag (no units)  Date Value  01/30/2007 NEGATIVE      HCV Ab (no units)  Date Value  01/30/2007 NEGATIVE    Lipids:    Component Value Date/Time   CHOL 170 06/03/2013 1015   TRIG 125 06/03/2013 1015   HDL 47 06/03/2013 1015   CHOLHDL 3.6 06/03/2013 1015   VLDL 25 06/03/2013 1015   LDLCALC 98 06/03/2013 1015    Assessment: Ms. Messner says that she typically misses her second dose of Isentress ~ 3x/week. She did  not provide a specific reason for the missed doses, saying she often just forgets about it. We discussed several adherence techniques, including keeping a pill box by her bedside to help her remember her evening dose, but she said she didn't think that would help. I discussed setting alarms on her phone, as well as looking at different apps that are available on smart phones that can serve as medication reminders. She also says she frequently does not take her medications around the same time each day. We discussed the importance of taking her medications as prescribed and around the same time each day to avoid the development of resistance. I also provided her with a keychain pill bottle to help with adherence as patient was interested in this option, though switching to a once daily integrase inhibitor should also improve adherence.   Recommendations: Change Isentress to Tivicay. Follow-up with adherence at next appointment.  Lucilia Yanni C. Inza Mikrut, PharmD Clinical Pharmacist-Resident Pager: [phone] Pharmacy: [phone] 03/10/2014 10:41 AM

## 2014-03-11 LAB — RPR

## 2014-03-11 LAB — HIV-1 RNA ULTRAQUANT REFLEX TO GENTYP+

## 2014-03-11 LAB — HEPATITIS B SURFACE ANTIBODY,QUALITATIVE: HEP B S AB: NEGATIVE

## 2014-03-11 LAB — T-HELPER CELL (CD4) - (RCID CLINIC ONLY)
CD4 % Helper T Cell: 28 % — ABNORMAL LOW (ref 33–55)
CD4 T Cell Abs: 340 /uL — ABNORMAL LOW (ref 400–2700)

## 2014-03-14 ENCOUNTER — Ambulatory Visit: Payer: Medicaid Other | Admitting: Internal Medicine

## 2014-03-16 ENCOUNTER — Encounter: Payer: Self-pay | Admitting: *Deleted

## 2014-04-12 ENCOUNTER — Ambulatory Visit (INDEPENDENT_AMBULATORY_CARE_PROVIDER_SITE_OTHER): Payer: Medicaid Other | Admitting: Internal Medicine

## 2014-04-12 ENCOUNTER — Encounter: Payer: Self-pay | Admitting: Internal Medicine

## 2014-04-12 VITALS — BP 129/90 | HR 58 | Temp 98.0°F | Wt 170.0 lb

## 2014-04-12 DIAGNOSIS — B20 Human immunodeficiency virus [HIV] disease: Secondary | ICD-10-CM

## 2014-04-12 DIAGNOSIS — Z0000 Encounter for general adult medical examination without abnormal findings: Secondary | ICD-10-CM

## 2014-04-12 DIAGNOSIS — Z21 Asymptomatic human immunodeficiency virus [HIV] infection status: Secondary | ICD-10-CM

## 2014-04-12 NOTE — Progress Notes (Signed)
Subjective:    Patient ID: Abigail Medina, female    DOB: September 29, 1966, 48 y.o.   MRN: [id number]  HPI48yo F with HIV, CD 4 count of 340/VL <20, last month changed to DLG/truvada. Doing well with her new regimen. She states that she occasionally gets numbness and cramping of hands bilaterally. She is recovering from her cold.  Current Outpatient Prescriptions on File Prior to Visit  Medication Sig Dispense Refill  . aspirin 81 MG chewable tablet Chew 81 mg by mouth daily.      Marland Kitchen atenolol (TENORMIN) 50 MG tablet TAKE 1 TABLET BY MOUTH EVERY DAY  30 tablet  3  . cyclobenzaprine (FLEXERIL) 10 MG tablet Take 1 tablet (10 mg total) by mouth 3 (three) times daily as needed for muscle spasms.  30 tablet  0  . dolutegravir (TIVICAY) 50 MG tablet Take 1 tablet (50 mg total) by mouth daily.  30 tablet  11  . emtricitabine-tenofovir (TRUVADA) 200-300 MG per tablet Take 1 tablet by mouth daily.  30 tablet  11  . fluticasone (FLONASE) 50 MCG/ACT nasal spray Place 2 sprays into the nose daily.      . hydrochlorothiazide (HYDRODIURIL) 25 MG tablet TAKE 1 TABLET BY MOUTH EVERY DAY  30 tablet  6  . HYDROcodone-acetaminophen (NORCO/VICODIN) 5-325 MG per tablet Take 1 tablet by mouth every 4 (four) hours as needed for pain.  20 tablet  0  . hydrOXYzine (ATARAX/VISTARIL) 25 MG tablet Take 25 mg by mouth at bedtime.      Marland Kitchen loratadine (CLARITIN) 10 MG tablet Take 10 mg by mouth every other day.      . topiramate (TOPAMAX) 50 MG tablet one tablet po hs for one week then increase to one tablet bid  60 tablet  1  . traZODone (DESYREL) 50 MG tablet Take 0.5 tablets (25 mg total) by mouth at bedtime. May increase to 1 tablet if needed.  30 tablet  2  . famotidine (PEPCID) 20 MG tablet Take 1 tablet (20 mg total) by mouth daily.  30 tablet  1  . [DISCONTINUED] lisinopril (PRINIVIL,ZESTRIL) 10 MG tablet TAKE 1 TABLET BY MOUTH EVERY DAY  30 tablet  2   No current facility-administered medications on file prior to visit.    Active Ambulatory Problems    Diagnosis Date Noted  . HIV DISEASE 08/06/2007  . THRUSH 12/28/2009  . NEVUS 08/29/2010  . HYPERTENSION 08/06/2007  . ACUTE BRONCHITIS 01/19/2009  . AMENORRHEA 07/19/2010  . CELLULITIS AND ABSCESS OF UNSPECIFIED SITE 05/21/2010  . SKIN RASH 05/21/2010  . VAGINAL DISCHARGE 12/26/2010  . 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 Seborrheic dermatitis 03/07/2011  . Folliculitis 84/69/6295  . Seborrhea capitis 04/18/2011  . Depression with anxiety 07/15/2011  . Insomnia 07/15/2011  . Chronic sinusitis 10/08/2011  . Sinusitis 10/08/2011   Resolved Ambulatory Problems    Diagnosis Date Noted  . No Resolved Ambulatory Problems   No Additional Past Medical History       Review of Systems 10 point ros is negative except    Objective:   Physical Exam BP 129/90  Pulse 58  Temp(Src) 98 F (36.7 C) (Oral)  Wt 170 lb (77.111 kg) Physical Exam  Constitutional:  oriented to person, place, and time. appears well-developed and well-nourished. No distress.  HENT:  Mouth/Throat: Oropharynx is clear and moist. No oropharyngeal exudate.  Cardiovascular: Normal rate, regular rhythm and normal heart sounds. Exam reveals no gallop and no friction rub.  No murmur heard.  Pulmonary/Chest: Effort normal and breath sounds normal. No respiratory distress.  has no wheezes.  Abdominal: Soft. Bowel sounds are normal.  exhibits no distension. There is no tenderness.  Lymphadenopathy: no cervical adenopathy.  Neurological: alert and oriented to person, place, and time.  Skin: Skin is warm and dry. No rash noted. No erythema.  Psychiatric: a normal mood and affect.  behavior is normal.         Assessment & Plan:  hiv = continue on truvada and tivicay  Health maintenance = will need mammo and pap smear  Cold/nasal congestion = do trial of mucinex

## 2014-04-19 ENCOUNTER — Ambulatory Visit (INDEPENDENT_AMBULATORY_CARE_PROVIDER_SITE_OTHER): Payer: Medicaid Other | Admitting: Family Medicine

## 2014-04-19 ENCOUNTER — Encounter: Payer: Self-pay | Admitting: Family Medicine

## 2014-04-19 VITALS — BP 116/82 | HR 71 | Temp 97.9°F | Resp 20 | Ht 65.0 in | Wt 170.0 lb

## 2014-04-19 DIAGNOSIS — I69959 Hemiplegia and hemiparesis following unspecified cerebrovascular disease affecting unspecified side: Secondary | ICD-10-CM

## 2014-04-19 DIAGNOSIS — K219 Gastro-esophageal reflux disease without esophagitis: Secondary | ICD-10-CM

## 2014-04-19 DIAGNOSIS — B20 Human immunodeficiency virus [HIV] disease: Secondary | ICD-10-CM

## 2014-04-19 NOTE — Progress Notes (Signed)
Subjective:    Patient ID: Abigail Medina, female    DOB: Jan 31, 1966, 48 y.o.   MRN: [id number]  HPI Patient is in office to establish care. She reports that it has been 6-7 months since she has had a primary provider. Reports that she is followed by Dr. Carlyle Basques at [facility].   Patient states that its been 6-7 months since she has had a primary provider. Patient is followed by Dr. Baxter Flattery every 3 months at infectious disease. Patient followed up with Dr Baxter Flattery on 04/12/2014 after her HIV medication regimen was changed to Truvada and Tivicay. She reports that she is doing well on new  medications.   Also, patient is complaining of left side weakness. Patient has a history of a CVA with left side weakness. Reports that she was referred to Dr. [name] at [facility], but has not gotten an appointment.    Review of Systems  Constitutional: Negative.   HENT: Positive for congestion.   Eyes: Negative.   Respiratory: Positive for cough.   Cardiovascular: Negative for palpitations.  Gastrointestinal: Negative for nausea, diarrhea, blood in stool and abdominal distention.  Endocrine: Negative.   Musculoskeletal: Positive for back pain (intermittent). Negative for myalgias.  Allergic/Immunologic: Negative.   Neurological: Positive for numbness (left side numbness to face,left hand and lower extremities).  Hematological: Negative.   Psychiatric/Behavioral: The patient is nervous/anxious.        Objective:   Physical Exam  Constitutional: She is oriented to person, place, and time. She appears well-developed and well-nourished.  HENT:  Head: Normocephalic and atraumatic.  Eyes: Conjunctivae are normal. Pupils are equal, round, and reactive to light.  Neck: Normal range of motion. Neck supple.  Cardiovascular: Normal rate and regular rhythm.   Pulmonary/Chest: Effort normal.  Abdominal: Soft. Normal appearance. There is no tenderness.  Musculoskeletal:   Lumbar back: She exhibits decreased range of motion and pain. She exhibits no tenderness.  Lymphadenopathy:       Head (right side): No submental and no submandibular adenopathy present.       Head (left side): No submental and no submandibular adenopathy present.  Neurological: She is alert and oriented to person, place, and time. She has normal reflexes. She is not disoriented. She displays no tremor. No cranial nerve deficit or sensory deficit. She displays a negative Romberg sign.  Skin: Skin is warm, dry and intact.  Psychiatric: Her speech is normal and behavior is normal. Judgment and thought content normal. She is not withdrawn. Cognition and memory are normal. She exhibits a depressed mood (Tearful). She expresses no homicidal and no suicidal ideation. She is attentive.          Assessment & Plan:  1. CVA: Patient was seen by Dr. [name] at [facility] for left side weakness and deficit after CVA. Reports left hand and left side of face still numb from the stroke. . Patient was referred to neurologist,  But did not get an appt quickly, so she stopped trying.  Encouraged to follow up with neurologist for left side weakness.   2. High blood pressure: Controlled on current medication regimen.   3. Preventative: Mammogram, refer to the breast center.  She reports that she has never had a colonoscopy. She will require a referral. Reports that she has been getting yearly pap smears at [facility], plans to continue to get them there.   4. Heart burn: Reports that she takes Pepcid 20 mg once daily, which is effective  5. Vaccinations: Reviewed vaccination hx. Patient is up to day. Will need influenza in the fall.   6. HIV:  Patient is followed by Dr. Carlyle Basques every 3 months. Patient saw Dr. Baxter Flattery for labs and medication  Recently and  Medication was changed to Truvada and Tivicay. Patient reports that her medication was changed due to non adherence. She admits that  she would forget to take the 2nd dose, so she was switched to a daily regimen. She states that she prefers to take medications once daily.   7. Depression: Patient reports that she has occasional depression pertaining to HIV diagnosis. She reports that she often feels alone in dealing with diagnosis. Patient reports that she has not shared diagnosis with her 6 children. It has been 14 years since initial diagnosis. Patient will need a referral for counseling. Patient denies suicidal or homicidal ideations. Recommend that patient follow up with Merita Norton, LCSW at ID for counseling options.    8. Cough: Patient was recommended to take Mucinex, by Dr. Baxter Flattery, she did not pick up the medication. Patient to start Mucinex trial, samples given  9. Back pain: Patient states that she has intermittent back pain. Patient was takes Flexeril as needed. Patient encouraged to stretch back, use warm, moist compreseses as needed, and OTC Aleve daily.  RTC: 1 month with Dr. Zigmund Daniel for [place], FNP

## 2014-04-20 MED ORDER — FAMOTIDINE 20 MG PO TABS: 20.0000 mg | ORAL_TABLET | Freq: Every day | ORAL | Status: DC

## 2014-05-05 ENCOUNTER — Encounter: Payer: Self-pay | Admitting: Internal Medicine

## 2014-05-05 ENCOUNTER — Ambulatory Visit: Payer: Medicaid Other | Admitting: Internal Medicine

## 2014-05-05 ENCOUNTER — Other Ambulatory Visit: Payer: Self-pay | Admitting: Internal Medicine

## 2014-05-05 VITALS — BP 109/84 | HR 71 | Temp 98.0°F | Resp 20 | Ht 65.0 in | Wt 170.0 lb

## 2014-05-05 DIAGNOSIS — I10 Essential (primary) hypertension: Secondary | ICD-10-CM

## 2014-05-05 DIAGNOSIS — E559 Vitamin D deficiency, unspecified: Secondary | ICD-10-CM

## 2014-05-05 DIAGNOSIS — Z0000 Encounter for general adult medical examination without abnormal findings: Secondary | ICD-10-CM

## 2014-05-05 LAB — BASIC METABOLIC PANEL
BUN: 8 mg/dL (ref 6–23)
CO2: 25 meq/L (ref 19–32)
CREATININE: 0.86 mg/dL (ref 0.50–1.35)
Calcium: 8.8 mg/dL (ref 8.4–10.5)
Chloride: 104 mEq/L (ref 96–112)
Glucose, Bld: 96 mg/dL (ref 70–99)
POTASSIUM: 3.5 meq/L (ref 3.5–5.3)
SODIUM: 137 meq/L (ref 135–145)

## 2014-05-05 LAB — MAGNESIUM: Magnesium: 1.9 mg/dL (ref 1.5–2.5)

## 2014-05-05 NOTE — Progress Notes (Unsigned)
   Subjective:    Patient ID: Abigail Medina, female    DOB: [birth year], 48 y.o.   MRN: [id number]  HPI: Pt here today to establish care. She is HIV positive and is currently under the care of [facility]. Pt states that she had a CVA in 2012 and is still having some disthesias. I explained that if it is related to the CVA from 2012, it is unlikely that she would regain normal sensation.     Review of Systems     Objective:   Physical Exam        Assessment & Plan:  1. HTN: BP a little on the low side but denies dizziness. She does however complain of low energy Check BMET   2. Cough: Improved since starting mucinex  3. Fatigue: Since having the stroke. Will check EKG  3. Tobacco Use disorder in remission:   4. Hemorrhoids:   5. Preventative Care: Will refer for Colonoscopy.   6.   7. Annual Physical: LNMP 3 years ago. Menopausal. Pt has occasional hot flashes. Sexually active with one partner and using protection. Last PAP 01/2013 normal. Will repeat next year. Lipids normal last year. No need for evaluation at this time. Refer for colonoscopy, Check Vitamin D levels ,  12 lead EKG, TSH

## 2014-05-06 LAB — TSH: TSH: 2.003 u[IU]/mL (ref 0.350–4.500)

## 2014-05-06 LAB — VITAMIN D 25 HYDROXY (VIT D DEFICIENCY, FRACTURES): Vit D, 25-Hydroxy: 18 ng/mL — ABNORMAL LOW (ref 30–89)

## 2014-05-17 ENCOUNTER — Encounter: Payer: Self-pay | Admitting: Internal Medicine

## 2014-05-18 ENCOUNTER — Encounter: Payer: Self-pay | Admitting: Internal Medicine

## 2014-05-18 MED ORDER — ERGOCALCIFEROL 1.25 MG (50000 UT) PO CAPS: 50000.0000 [IU] | ORAL_CAPSULE | ORAL | Status: DC

## 2014-05-18 NOTE — Progress Notes (Signed)
Quick Note:  Notes Recorded by Leana Gamer, MD on 05/18/2014 at 6:02 PM Pt started on Vitamin D. Prescription sent to Pharmacy   ______

## 2014-05-18 NOTE — Addendum Note (Signed)
Addended by: Liston Alba A on: 05/18/2014 06:10 PM   Modules accepted: Orders

## 2014-05-18 NOTE — Progress Notes (Signed)
Prescription written for Drisdol 50, 000 units weekly.

## 2014-05-20 ENCOUNTER — Telehealth: Payer: Self-pay

## 2014-05-20 NOTE — Telephone Encounter (Signed)
Pt was contacted this A.M and told of her Rx for Vit D that has been sent over to her local Pharm.Pt voiced understanding of this.

## 2014-06-01 ENCOUNTER — Other Ambulatory Visit: Payer: Medicaid Other

## 2014-06-02 ENCOUNTER — Other Ambulatory Visit: Payer: Medicaid Other

## 2014-06-02 ENCOUNTER — Other Ambulatory Visit (HOSPITAL_COMMUNITY)
Admission: RE | Admit: 2014-06-02 | Discharge: 2014-06-02 | Disposition: A | Discharge status: Home / Self Care | Payer: Medicaid Other | Source: Ambulatory Visit | Attending: Internal Medicine | Admitting: Internal Medicine

## 2014-06-02 DIAGNOSIS — Z113 Encounter for screening for infections with a predominantly sexual mode of transmission: Secondary | ICD-10-CM

## 2014-06-02 DIAGNOSIS — Z79899 Other long term (current) drug therapy: Secondary | ICD-10-CM

## 2014-06-02 DIAGNOSIS — B20 Human immunodeficiency virus [HIV] disease: Secondary | ICD-10-CM

## 2014-06-02 LAB — CBC WITH DIFFERENTIAL/PLATELET
Basophils Absolute: 0 10*3/uL (ref 0.0–0.1)
Basophils Relative: 0 % (ref 0–1)
Eosinophils Absolute: 0.2 10*3/uL (ref 0.0–0.7)
Eosinophils Relative: 5 % (ref 0–5)
HEMATOCRIT: 42.4 % (ref 36.0–46.0)
HEMOGLOBIN: 15.1 g/dL — AB (ref 12.0–15.0)
LYMPHS ABS: 1.7 10*3/uL (ref 0.7–4.0)
LYMPHS PCT: 51 % — AB (ref 12–46)
MCH: 31.6 pg (ref 26.0–34.0)
MCHC: 35.6 g/dL (ref 30.0–36.0)
MCV: 88.7 fL (ref 78.0–100.0)
MONO ABS: 0.3 10*3/uL (ref 0.1–1.0)
Monocytes Relative: 8 % (ref 3–12)
NEUTROS PCT: 36 % — AB (ref 43–77)
Neutro Abs: 1.2 10*3/uL — ABNORMAL LOW (ref 1.7–7.7)
Platelets: 156 10*3/uL (ref 150–400)
RBC: 4.78 MIL/uL (ref 3.87–5.11)
RDW: 13.8 % (ref 11.5–15.5)
WBC: 3.3 10*3/uL — AB (ref 4.0–10.5)

## 2014-06-02 LAB — COMPREHENSIVE METABOLIC PANEL
ALBUMIN: 4.1 g/dL (ref 3.5–5.2)
ALK PHOS: 79 U/L (ref 39–117)
ALT: 26 U/L (ref 0–35)
AST: 24 U/L (ref 0–37)
BILIRUBIN TOTAL: 1.5 mg/dL — AB (ref 0.2–1.2)
BUN: 13 mg/dL (ref 6–23)
CO2: 24 meq/L (ref 19–32)
Calcium: 9.1 mg/dL (ref 8.4–10.5)
Chloride: 104 mEq/L (ref 96–112)
Creat: 0.85 mg/dL (ref 0.50–1.10)
GLUCOSE: 97 mg/dL (ref 70–99)
Potassium: 3.8 mEq/L (ref 3.5–5.3)
SODIUM: 140 meq/L (ref 135–145)
Total Protein: 7 g/dL (ref 6.0–8.3)

## 2014-06-02 LAB — LIPID PANEL
CHOL/HDL RATIO: 3.5 ratio
CHOLESTEROL: 163 mg/dL (ref 0–200)
HDL: 47 mg/dL (ref >39)
LDL Cholesterol: 95 mg/dL (ref 0–99)
TRIGLYCERIDES: 106 mg/dL (ref <150)
VLDL: 21 mg/dL (ref 0–40)

## 2014-06-03 ENCOUNTER — Encounter: Payer: Self-pay | Admitting: *Deleted

## 2014-06-03 LAB — T-HELPER CELL (CD4) - (RCID CLINIC ONLY)
CD4 T CELL ABS: 410 /uL (ref 400–2700)
CD4 T CELL HELPER: 25 % — AB (ref 33–55)

## 2014-06-07 LAB — HIV-1 RNA QUANT-NO REFLEX-BLD: HIV-1 RNA Quant, Log: <1.3 {Log} (ref <1.30)

## 2014-06-15 ENCOUNTER — Encounter: Payer: Self-pay | Admitting: Internal Medicine

## 2014-06-15 ENCOUNTER — Ambulatory Visit (INDEPENDENT_AMBULATORY_CARE_PROVIDER_SITE_OTHER): Payer: Medicaid Other | Admitting: Internal Medicine

## 2014-06-15 ENCOUNTER — Ambulatory Visit
Admission: RE | Admit: 2014-06-15 | Discharge: 2014-06-15 | Disposition: A | Discharge status: Home / Self Care | Payer: Medicaid Other | Source: Ambulatory Visit | Attending: Internal Medicine | Admitting: Internal Medicine

## 2014-06-15 VITALS — BP 124/85 | HR 50 | Temp 97.2°F | Wt 172.0 lb

## 2014-06-15 DIAGNOSIS — B20 Human immunodeficiency virus [HIV] disease: Secondary | ICD-10-CM

## 2014-06-15 DIAGNOSIS — Z21 Asymptomatic human immunodeficiency virus [HIV] infection status: Secondary | ICD-10-CM

## 2014-06-15 DIAGNOSIS — M25561 Pain in right knee: Secondary | ICD-10-CM

## 2014-06-15 DIAGNOSIS — R05 Cough: Secondary | ICD-10-CM

## 2014-06-15 DIAGNOSIS — Z0000 Encounter for general adult medical examination without abnormal findings: Secondary | ICD-10-CM

## 2014-06-15 DIAGNOSIS — M25569 Pain in unspecified knee: Secondary | ICD-10-CM

## 2014-06-15 DIAGNOSIS — R053 Chronic cough: Secondary | ICD-10-CM

## 2014-06-15 DIAGNOSIS — R059 Cough, unspecified: Secondary | ICD-10-CM

## 2014-06-15 NOTE — Progress Notes (Signed)
Subjective:    Patient ID: Abigail Medina, female    DOB: [birth year], 48 y.o.   MRN: [id number]  HPI 48yo F with HIV, CD 4 count of 410 VL <20, on DLG/truvada. Doing well with her new regimen.good adherence. Established care with dr. Zigmund Daniel. Doing well except she still has productive chronic cough that improved with mucinex, now happening again. She notices it most when moving from warm to cold environment. She also had a ground level fall injured her right knee severa lweeks ago. Initially had swelling and pain. Now just pain  Current Outpatient Prescriptions on File Prior to Visit  Medication Sig Dispense Refill  . aspirin 81 MG chewable tablet Chew 81 mg by mouth daily.      Marland Kitchen atenolol (TENORMIN) 50 MG tablet TAKE 1 TABLET BY MOUTH EVERY DAY  30 tablet  3  . cyclobenzaprine (FLEXERIL) 10 MG tablet Take 1 tablet (10 mg total) by mouth 3 (three) times daily as needed for muscle spasms.  30 tablet  0  . dolutegravir (TIVICAY) 50 MG tablet Take 1 tablet (50 mg total) by mouth daily.  30 tablet  11  . emtricitabine-tenofovir (TRUVADA) 200-300 MG per tablet Take 1 tablet by mouth daily.  30 tablet  11  . ergocalciferol (VITAMIN D2) 50000 UNITS capsule Take 1 capsule (50,000 Units total) by mouth once a week.  4 capsule  12  . famotidine (PEPCID) 20 MG tablet Take 1 tablet (20 mg total) by mouth daily.  30 tablet  1  . fluticasone (FLONASE) 50 MCG/ACT nasal spray Place 2 sprays into the nose daily.      . hydrochlorothiazide (HYDRODIURIL) 25 MG tablet TAKE 1 TABLET BY MOUTH EVERY DAY  30 tablet  6  . HYDROcodone-acetaminophen (NORCO/VICODIN) 5-325 MG per tablet Take 1 tablet by mouth every 4 (four) hours as needed for pain.  20 tablet  0  . hydrOXYzine (ATARAX/VISTARIL) 25 MG tablet Take 25 mg by mouth at bedtime.      Marland Kitchen loratadine (CLARITIN) 10 MG tablet Take 10 mg by mouth every other day.      . topiramate (TOPAMAX) 50 MG tablet one tablet po hs for one week then increase to one tablet bid  60  tablet  1  . traZODone (DESYREL) 50 MG tablet Take 0.5 tablets (25 mg total) by mouth at bedtime. May increase to 1 tablet if needed.  30 tablet  2  . [DISCONTINUED] lisinopril (PRINIVIL,ZESTRIL) 10 MG tablet TAKE 1 TABLET BY MOUTH EVERY DAY  30 tablet  2   No current facility-administered medications on file prior to visit.   Active Ambulatory Problems    Diagnosis Date Noted  . HIV DISEASE 08/06/2007  . THRUSH 12/28/2009  . NEVUS 08/29/2010  . HYPERTENSION 08/06/2007  . ACUTE BRONCHITIS 01/19/2009  . AMENORRHEA 07/19/2010  . CELLULITIS AND ABSCESS OF UNSPECIFIED SITE 05/21/2010  . SKIN RASH 05/21/2010  . VAGINAL DISCHARGE 12/26/2010  . 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 Seborrheic dermatitis 03/07/2011  . Folliculitis 62/83/1517  . Seborrhea capitis 04/18/2011  . Depression with anxiety 07/15/2011  . Insomnia 07/15/2011  . Chronic sinusitis 10/08/2011  . Sinusitis 10/08/2011   Resolved Ambulatory Problems    Diagnosis Date Noted  . No Resolved Ambulatory Problems   No Additional Past Medical History       Review of Systems 10 point ros reviewed positive for post nasal drainage, chronic cough, and right knee pain from ground level fall    Objective:  Physical Exam Temp(Src) 97.2 F (36.2 C) (Oral)  Wt 172 lb (78.019 kg) Physical Exam  Constitutional:  oriented to person, place, and time. appears well-developed and well-nourished. No distress.  HENT:  Mouth/Throat: Oropharynx is clear and moist. No oropharyngeal exudate.  Cardiovascular: Normal rate, regular rhythm and normal heart sounds. Exam reveals no gallop and no friction rub.  No murmur heard.  Pulmonary/Chest: Effort normal and breath sounds normal. No respiratory distress.  has no wheezes.  Lymphadenopathy: no cervical adenopathy.  Neurological: alert and oriented to person, place, and time.  Skin: Skin is warm and dry. No rash noted. No erythema.  Psychiatric: a normal mood and affect. behavior is  normal.  Ext = right knee full range of motion no effusion     assessment:  Assessment & Plan:  hiv = well controlled, continue on current regimen  health maintenance= had mammo/pap in feb 2014, will need schedule for both. Also needs to get colonoscopy sometime this year  Right knee pain = will get plain films to rule out patellar fracture but feel she just has bruising.  Chronic cough/allergic rhinitis = do a trial of zyrtec if no improvement try omeprazole, possibly due to GERD since she reports evening cough. Follow up with PCP

## 2014-06-25 ENCOUNTER — Other Ambulatory Visit: Payer: Self-pay | Admitting: Internal Medicine

## 2014-06-30 ENCOUNTER — Other Ambulatory Visit: Payer: Self-pay | Admitting: Internal Medicine

## 2014-08-05 ENCOUNTER — Encounter: Payer: Self-pay | Admitting: Family Medicine

## 2014-08-05 ENCOUNTER — Ambulatory Visit (INDEPENDENT_AMBULATORY_CARE_PROVIDER_SITE_OTHER): Payer: Medicaid Other | Admitting: Family Medicine

## 2014-08-05 VITALS — BP 138/98 | HR 64 | Temp 97.9°F | Resp 18 | Ht 64.0 in | Wt 176.5 lb

## 2014-08-05 DIAGNOSIS — E559 Vitamin D deficiency, unspecified: Secondary | ICD-10-CM

## 2014-08-05 DIAGNOSIS — F418 Other specified anxiety disorders: Secondary | ICD-10-CM

## 2014-08-05 DIAGNOSIS — F341 Dysthymic disorder: Secondary | ICD-10-CM | POA: Diagnosis not present

## 2014-08-05 DIAGNOSIS — R221 Localized swelling, mass and lump, neck: Secondary | ICD-10-CM

## 2014-08-05 DIAGNOSIS — G4700 Insomnia, unspecified: Secondary | ICD-10-CM | POA: Diagnosis not present

## 2014-08-05 DIAGNOSIS — R220 Localized swelling, mass and lump, head: Secondary | ICD-10-CM

## 2014-08-05 DIAGNOSIS — I10 Essential (primary) hypertension: Secondary | ICD-10-CM

## 2014-08-05 DIAGNOSIS — B20 Human immunodeficiency virus [HIV] disease: Secondary | ICD-10-CM

## 2014-08-05 DIAGNOSIS — K219 Gastro-esophageal reflux disease without esophagitis: Secondary | ICD-10-CM

## 2014-08-05 MED ORDER — ERGOCALCIFEROL 1.25 MG (50000 UT) PO CAPS: 50000.0000 [IU] | ORAL_CAPSULE | ORAL | Status: DC

## 2014-08-05 MED ORDER — CIMETIDINE 200 MG PO TABS: 200.0000 mg | ORAL_TABLET | Freq: Two times a day (BID) | ORAL | Status: DC

## 2014-08-05 MED ORDER — ASPIRIN 81 MG PO CHEW: 81.0000 mg | CHEWABLE_TABLET | Freq: Every day | ORAL | Status: DC

## 2014-08-05 MED ORDER — CETIRIZINE HCL 10 MG PO TABS: 10.0000 mg | ORAL_TABLET | Freq: Every day | ORAL | Status: DC

## 2014-08-05 MED ORDER — TRAZODONE HCL 50 MG PO TABS: 25.0000 mg | ORAL_TABLET | Freq: Every day | ORAL | Status: DC

## 2014-08-05 NOTE — Patient Instructions (Signed)

## 2014-08-05 NOTE — Progress Notes (Signed)
Subjective:    Patient ID: Abigail Medina, female    DOB: 1966-01-22, 48 y.o.   MRN: [id number]  HPI  Patient states that she has some swelling to the top lip and tightness to left face that started days ago. Patient denies changing toothpaste, soap, detergent, foods or facial cream. She states that the only thing that she can remember differently is eating a nectarine. Patient reports that she is taking routine medications consistently.   Patient here for follow-up of elevated blood pressure. She is not exercising and is not adherent to low salt diet.  Blood pressure is typically well controlled at home.  Patient denies chest pain, claudication, dyspnea, irregular heart beat, near-syncope, orthopnea, palpitations and syncope  . Review of Systems  Constitutional: Positive for fatigue.  HENT: Positive for facial swelling.   Eyes: Negative.   Respiratory: Negative.   Cardiovascular: Positive for leg swelling.  Gastrointestinal: Negative.   Endocrine: Negative.   Genitourinary: Positive for menstrual problem. Negative for difficulty urinating.  Musculoskeletal: Negative.   Skin: Negative.   Allergic/Immunologic: Negative.   Neurological: Positive for numbness. Negative for dizziness.  Hematological: Negative.   Psychiatric/Behavioral: Negative.        Patient states that she feels depression.        Objective:   Physical Exam  Constitutional: She is oriented to person, place, and time. She appears well-developed and well-nourished.  HENT:  Head: Normocephalic and atraumatic.  Mouth/Throat: Mucous membranes are normal. She has dentures. Abnormal dentition. Dental caries present.  Eyes: Conjunctivae and EOM are normal. Pupils are equal, round, and reactive to light.  Neck: Normal range of motion. Neck supple.  Cardiovascular: Normal rate, regular rhythm and normal heart sounds.   Pulmonary/Chest: Effort normal and breath sounds normal.  Abdominal: Soft. Bowel sounds are normal.    Musculoskeletal: Normal range of motion.  Neurological: She is alert and oriented to person, place, and time. She has normal reflexes.  Skin: Skin is warm and dry.     Psychiatric: She has a normal mood and affect. Her behavior is normal. Judgment and thought content normal.         BP 147/100  Pulse 64  Temp(Src) 97.9 F (36.6 C)  Resp 18  Ht 5\' 4"  (1.626 m)  Wt 176 lb 8 oz (80.06 kg)  BMI 30.28 kg/m2  SpO2 100%  Assessment & Plan:  1. HYPERTENSION -BP stable, manual re-check 145/89. Continue current medication regimen -Reviewed last CMP, wnl -Complete metabolic panel (future order)  2. Swelling of upper lip -Apply cool compress to upper lips as needed - cetirizine (ZYRTEC) 10 MG tablet; Take 1 tablet (10 mg total) by mouth daily.  Dispense: 30 tablet; Refill: 11 3. HIV:  -Follow up with Dr. Baxter Flattery as scheduled for labs -She reports that she takes medications consistently 4. Depression with anxiety She reports that her depression is situational. He house is currently in foreclosure and she is unemployed. She currently denies suicidal or homicidal ideations.   5  Insomnia  traZODone (DESYREL) 50 MG tablet; Take 0.5 tablets (25 mg total) by mouth at bedtime. May increase to 1 tablet if needed.  Dispense: 30 tablet; Refill: 2  6 Vitamin D deficiency   ergocalciferol (VITAMIN D2) 50000 UNITS capsule; Take 1 capsule (50,000 Units total) by mouth once a week.  Dispense: 4 capsule; Refill: 12    7. Gastroesophageal reflux disease without esophagitis  - cimetidine (TAGAMET) 200 MG tablet; Take 1 tablet (200 mg total) by mouth  2 (two) times daily.  Dispense: 30 tablet; Refill: 2    RTC: 3 months for hypertension

## 2014-08-19 ENCOUNTER — Ambulatory Visit (INDEPENDENT_AMBULATORY_CARE_PROVIDER_SITE_OTHER): Payer: Medicaid Other | Admitting: *Deleted

## 2014-08-19 ENCOUNTER — Other Ambulatory Visit (HOSPITAL_COMMUNITY)
Admission: RE | Admit: 2014-08-19 | Discharge: 2014-08-19 | Disposition: A | Discharge status: Home / Self Care | Payer: Medicaid Other | Source: Ambulatory Visit | Attending: Internal Medicine | Admitting: Internal Medicine

## 2014-08-19 DIAGNOSIS — Z01419 Encounter for gynecological examination (general) (routine) without abnormal findings: Secondary | ICD-10-CM | POA: Insufficient documentation

## 2014-08-19 DIAGNOSIS — Z124 Encounter for screening for malignant neoplasm of cervix: Secondary | ICD-10-CM

## 2014-08-19 DIAGNOSIS — Z0000 Encounter for general adult medical examination without abnormal findings: Secondary | ICD-10-CM

## 2014-08-19 NOTE — Patient Instructions (Signed)
Your results will be ready in about a week.  I will mail them to you.  Thank you for coming to the Center for your care.  Oluwademilade Mckiver,  RN 

## 2014-08-19 NOTE — Progress Notes (Addendum)
  Subjective:     Abigail Medina is a 48 y.o. woman who comes in today for a  pap smear only.  Previous abnormal Pap smears: yes, x2 followed up by [facility]. Contraception: condoms.  Recent return of menses 08/04/14 after no menses for 3 years.  Menses lasted 2.5 days aas was her usual pattern previously.  No clots.  Objective:    There were no vitals taken for this visit.   LMP 08/04/14, lasted 2.5 days. Pelvic Exam:  Pap smear obtained.   Assessment:    Screening pap smear.   Plan:    Follow up in one year, or as indicated by Pap results.  Pt given educational materials re: HIV and women, self-esteem, BSE, nutrition and diet management, PAP smears and partner safety. Pt given condoms.

## 2014-08-24 LAB — CYTOLOGY - PAP

## 2014-08-25 ENCOUNTER — Encounter: Payer: Self-pay | Admitting: *Deleted

## 2014-09-05 ENCOUNTER — Other Ambulatory Visit: Payer: Self-pay | Admitting: Internal Medicine

## 2014-09-13 ENCOUNTER — Ambulatory Visit (HOSPITAL_COMMUNITY): Payer: Medicaid Other

## 2014-09-13 ENCOUNTER — Ambulatory Visit (HOSPITAL_COMMUNITY)
Admission: RE | Admit: 2014-09-13 | Discharge: 2014-09-13 | Disposition: A | Discharge status: Home / Self Care | Payer: Medicaid Other | Source: Ambulatory Visit | Attending: Internal Medicine | Admitting: Internal Medicine

## 2014-09-13 DIAGNOSIS — Z1231 Encounter for screening mammogram for malignant neoplasm of breast: Secondary | ICD-10-CM | POA: Insufficient documentation

## 2014-09-13 DIAGNOSIS — Z0000 Encounter for general adult medical examination without abnormal findings: Secondary | ICD-10-CM

## 2014-10-07 ENCOUNTER — Telehealth: Payer: Self-pay | Admitting: Internal Medicine

## 2014-10-07 NOTE — Telephone Encounter (Signed)
Patient needs an appointment. Called and scheduled an appointment for Monday 10/10/2014 @1 :15pm. Thanks!

## 2014-10-10 ENCOUNTER — Ambulatory Visit (INDEPENDENT_AMBULATORY_CARE_PROVIDER_SITE_OTHER): Payer: Medicaid Other | Admitting: Family Medicine

## 2014-10-10 ENCOUNTER — Ambulatory Visit (HOSPITAL_COMMUNITY)
Admission: RE | Admit: 2014-10-10 | Discharge: 2014-10-10 | Disposition: A | Discharge status: Home / Self Care | Payer: Medicaid Other | Source: Ambulatory Visit | Attending: Family Medicine | Admitting: Family Medicine

## 2014-10-10 ENCOUNTER — Encounter: Payer: Self-pay | Admitting: Family Medicine

## 2014-10-10 VITALS — BP 128/93 | HR 71 | Temp 98.1°F | Resp 16 | Ht 63.5 in | Wt 176.0 lb

## 2014-10-10 DIAGNOSIS — E559 Vitamin D deficiency, unspecified: Secondary | ICD-10-CM | POA: Diagnosis not present

## 2014-10-10 DIAGNOSIS — M545 Low back pain, unspecified: Secondary | ICD-10-CM

## 2014-10-10 DIAGNOSIS — I69959 Hemiplegia and hemiparesis following unspecified cerebrovascular disease affecting unspecified side: Secondary | ICD-10-CM

## 2014-10-10 DIAGNOSIS — B20 Human immunodeficiency virus [HIV] disease: Secondary | ICD-10-CM

## 2014-10-10 DIAGNOSIS — R200 Anesthesia of skin: Secondary | ICD-10-CM | POA: Insufficient documentation

## 2014-10-10 DIAGNOSIS — I10 Essential (primary) hypertension: Secondary | ICD-10-CM | POA: Diagnosis not present

## 2014-10-10 DIAGNOSIS — Z23 Encounter for immunization: Secondary | ICD-10-CM

## 2014-10-10 DIAGNOSIS — K219 Gastro-esophageal reflux disease without esophagitis: Secondary | ICD-10-CM

## 2014-10-10 DIAGNOSIS — R208 Other disturbances of skin sensation: Secondary | ICD-10-CM

## 2014-10-10 MED ORDER — CIMETIDINE 200 MG PO TABS: 200.0000 mg | ORAL_TABLET | Freq: Two times a day (BID) | ORAL | Status: DC

## 2014-10-10 MED ORDER — TOPIRAMATE 50 MG PO TABS: ORAL_TABLET | ORAL | Status: DC

## 2014-10-11 LAB — ARTHRITIS PANEL
ANA: NEGATIVE
Rhuematoid fact SerPl-aCnc: <10 IU/mL (ref <=14)
SED RATE: 7 mm/h (ref 0–22)
Uric Acid, Serum: 6.9 mg/dL (ref 2.4–7.0)

## 2014-10-11 NOTE — Progress Notes (Signed)
Subjective:    Patient ID: Abigail Medina, female    DOB: 04/04/1966, 48 y.o.   MRN: [id number]  HPI  Abigail Medina a patient with a history of a left CVA presents with left hand numbness and tingling and low back pain. Abigail Medina states that she had a left CVA 3 years ago and was previously followed by Dr. [name] at [facility]. She states that she has not been able to schedule a follow up appointment. She maintains that she was previously taking Topamax for left extremity numbness and tingling, but she has not had medication for greater than 3 months. Symptoms of left hand numbness and tingling have been gradually worsening.    Patient also presents for presents for  evaluation of low back problems.  Symptoms have been present for several months and include pain in mid lumbar sacral back (aching in character; 6/10 in severity). She denies an inciting event. Alleviating factors identifiable by patient are bending forwards, bending sideways, recumbency, sitting and standing. Exacerbating factors identifiable by patient are sitting and standing. She maintains that she has not attempted any OTC interventions to alleviate symptoms.    Review of Systems  HENT: Negative.   Eyes: Negative.   Respiratory: Negative.   Cardiovascular: Negative.   Gastrointestinal: Negative.   Endocrine: Negative.   Musculoskeletal: Positive for back pain and myalgias.  Skin: Negative.   Neurological: Positive for facial asymmetry and weakness. Negative for tremors, syncope and speech difficulty.  Psychiatric/Behavioral: Negative for suicidal ideas.       Objective:   Physical Exam  HENT:  Head: Normocephalic and atraumatic.  Right Ear: Hearing, tympanic membrane, external ear and ear canal normal.  Left Ear: Hearing, tympanic membrane, external ear and ear canal normal.  Nose: Nose normal.  Mouth/Throat: Uvula is midline and oropharynx is clear and moist.  Neck: Normal range of motion and full passive range of  motion without pain.  Cardiovascular: Normal rate, regular rhythm, normal heart sounds, intact distal pulses and normal pulses.   Pulmonary/Chest: Effort normal and breath sounds normal.  Abdominal: Soft. Normal appearance. Bowel sounds are absent. There is no tenderness.  Musculoskeletal:       Left wrist: She exhibits decreased range of motion and tenderness. She exhibits no swelling.       Lumbar back: She exhibits decreased range of motion, tenderness and pain. She exhibits no bony tenderness, no swelling and no edema.  Pain with straight leg lifts.   Neurological: She displays no tremor. No sensory deficit. Coordination and gait abnormal.         BP 128/93  Pulse 71  Temp(Src) 98.1 F (36.7 C) (Oral)  Resp 16  Ht 5' 3.5" (1.613 m)  Wt 176 lb (79.833 kg)  BMI 30.68 kg/m2  LMP 08/04/2014 Assessment & Plan:  1.Hemiplegia, late effect of cerebrovascular disease Recommend that she schedule follow up appointment with Dr. [name] - topiramate (TOPAMAX) 50 MG tablet; one tablet po hs for one week then increase to one tablet bid  Dispense: 60 tablet; Refill: 1 2. Arm numbness left - topiramate (TOPAMAX) 50 MG tablet; one tablet po hs for one week then increase to one tablet bid  Dispense: 60 tablet; Refill: 1  3. Midline low back pain without sciatica Demonstrated back exercises. Provided written information on back exercises.  - Arthritis Panel - DG Lumbar Spine Complete; Future  4. Human immunodeficiency virus (HIV) disease Stable on current medication regimen. Patient reports that she has to schedule a  follow up appointment with Dr. Baxter Flattery.   5. Essential hypertension Stable on current medication regimen  6.  Vitamin D deficiency Patient states that she did not start vitamin D. I recommend that she pick up the prescription   7. Gastroesophageal reflux disease without esophagitis - cimetidine (TAGAMET) 200 MG tablet; Take 1 tablet (200 mg total) by mouth 2 (two) times daily.   Dispense: 30 tablet; Refill: 2   8. Need for immunization against influenza - Flu Vaccine QUAD 36+ mos IM (Fluarix)  Stephenia Vogan M, FNP RTC: as scheduled

## 2014-11-04 ENCOUNTER — Ambulatory Visit: Payer: Medicaid Other | Admitting: Family Medicine

## 2014-11-09 ENCOUNTER — Other Ambulatory Visit: Payer: Self-pay | Admitting: Internal Medicine

## 2014-11-14 ENCOUNTER — Other Ambulatory Visit: Payer: Self-pay | Admitting: *Deleted

## 2014-11-14 DIAGNOSIS — I10 Essential (primary) hypertension: Secondary | ICD-10-CM

## 2014-11-14 MED ORDER — ATENOLOL 50 MG PO TABS: 50.0000 mg | ORAL_TABLET | Freq: Every day | ORAL | Status: DC

## 2014-11-21 ENCOUNTER — Other Ambulatory Visit: Payer: Self-pay | Admitting: Internal Medicine

## 2015-02-26 ENCOUNTER — Other Ambulatory Visit: Payer: Self-pay | Admitting: Family Medicine

## 2015-03-11 ENCOUNTER — Other Ambulatory Visit: Payer: Self-pay | Admitting: Internal Medicine

## 2015-04-15 ENCOUNTER — Other Ambulatory Visit: Payer: Self-pay | Admitting: Internal Medicine

## 2015-05-30 ENCOUNTER — Ambulatory Visit (INDEPENDENT_AMBULATORY_CARE_PROVIDER_SITE_OTHER): Payer: Medicaid Other | Admitting: Family Medicine

## 2015-05-30 ENCOUNTER — Encounter: Payer: Self-pay | Admitting: Family Medicine

## 2015-05-30 VITALS — BP 122/86 | HR 69 | Temp 97.8°F | Resp 16 | Ht 64.0 in | Wt 158.0 lb

## 2015-05-30 DIAGNOSIS — R29898 Other symptoms and signs involving the musculoskeletal system: Secondary | ICD-10-CM

## 2015-05-30 DIAGNOSIS — M62830 Muscle spasm of back: Secondary | ICD-10-CM

## 2015-05-30 DIAGNOSIS — I10 Essential (primary) hypertension: Secondary | ICD-10-CM | POA: Diagnosis not present

## 2015-05-30 DIAGNOSIS — I69959 Hemiplegia and hemiparesis following unspecified cerebrovascular disease affecting unspecified side: Secondary | ICD-10-CM

## 2015-05-30 DIAGNOSIS — R200 Anesthesia of skin: Secondary | ICD-10-CM

## 2015-05-30 DIAGNOSIS — B20 Human immunodeficiency virus [HIV] disease: Secondary | ICD-10-CM

## 2015-05-30 DIAGNOSIS — R5383 Other fatigue: Secondary | ICD-10-CM

## 2015-05-30 DIAGNOSIS — R202 Paresthesia of skin: Secondary | ICD-10-CM

## 2015-05-30 DIAGNOSIS — R208 Other disturbances of skin sensation: Secondary | ICD-10-CM

## 2015-05-30 DIAGNOSIS — F17200 Nicotine dependence, unspecified, uncomplicated: Secondary | ICD-10-CM

## 2015-05-30 LAB — CBC WITH DIFFERENTIAL/PLATELET
Basophils Absolute: 0 10*3/uL (ref 0.0–0.1)
Basophils Relative: 0 % (ref 0–1)
EOS ABS: 0.1 10*3/uL (ref 0.0–0.7)
EOS PCT: 3 % (ref 0–5)
HCT: 42.9 % (ref 36.0–46.0)
HEMOGLOBIN: 15 g/dL (ref 12.0–15.0)
LYMPHS PCT: 54 % — AB (ref 12–46)
Lymphs Abs: 1.9 10*3/uL (ref 0.7–4.0)
MCH: 31.8 pg (ref 26.0–34.0)
MCHC: 35 g/dL (ref 30.0–36.0)
MCV: 91.1 fL (ref 78.0–100.0)
MONO ABS: 0.2 10*3/uL (ref 0.1–1.0)
MPV: 10.9 fL (ref 8.6–12.4)
Monocytes Relative: 5 % (ref 3–12)
Neutro Abs: 1.3 10*3/uL — ABNORMAL LOW (ref 1.7–7.7)
Neutrophils Relative %: 38 % — ABNORMAL LOW (ref 43–77)
Platelets: 162 10*3/uL (ref 150–400)
RBC: 4.71 MIL/uL (ref 3.87–5.11)
RDW: 14.2 % (ref 11.5–15.5)
WBC: 3.5 10*3/uL — ABNORMAL LOW (ref 4.0–10.5)

## 2015-05-30 LAB — BASIC METABOLIC PANEL
BUN: 14 mg/dL (ref 6–23)
CO2: 29 mEq/L (ref 19–32)
CREATININE: 1.05 mg/dL (ref 0.50–1.10)
Calcium: 9.3 mg/dL (ref 8.4–10.5)
Chloride: 105 mEq/L (ref 96–112)
Glucose, Bld: 87 mg/dL (ref 70–99)
POTASSIUM: 3.5 meq/L (ref 3.5–5.3)
SODIUM: 141 meq/L (ref 135–145)

## 2015-05-30 LAB — POCT URINALYSIS DIP (DEVICE)
Bilirubin Urine: NEGATIVE
Glucose, UA: NEGATIVE mg/dL
Hgb urine dipstick: NEGATIVE
KETONES UR: NEGATIVE mg/dL
Leukocytes, UA: NEGATIVE
Nitrite: NEGATIVE
PROTEIN: NEGATIVE mg/dL
SPECIFIC GRAVITY, URINE: 1.025 (ref 1.005–1.030)
UROBILINOGEN UA: 1 mg/dL (ref 0.0–1.0)
pH: 6 (ref 5.0–8.0)

## 2015-05-30 MED ORDER — TOPIRAMATE 50 MG PO TABS: ORAL_TABLET | ORAL | Status: DC

## 2015-05-30 MED ORDER — CYCLOBENZAPRINE HCL 10 MG PO TABS: 10.0000 mg | ORAL_TABLET | Freq: Three times a day (TID) | ORAL | Status: DC | PRN

## 2015-05-30 NOTE — Progress Notes (Signed)
Subjective:    Patient ID: Abigail Medina, female    DOB: [birth year], 49 y.o.   MRN: [id number]  HPI [name], a 49 year old female presents with a history of HIV, hypertension, chronic low back pain and left CVA presents  with a new weakness, numbness and tingling to right arm. Patient has occasional pain to right arm. She reports that she is having difficulty lifting small items. She has a history of a cerebral vascular accident 4 years ago. She states that she was previously followed by neurologist post CVA, but has not been for follow up in several years. She states that she has been out of Topamax for greater than 1 month. Patient denies headache, facial weakness, fever, dysuria, nausea, vomiting or diarrhea.   Ms. Halliday also has a history of hypertension.  She is not exercising and is not adherent to low salt diet. Patient does not check blood pressures at home. a. Patient denies dizziness,  chest pain, dyspnea, irregular heart beat, near-syncope, palpitations and tachypnea.  Cardiovascular risk factors include: hypertension, sedentary lifestyle and smoking/ tobacco exposure. No past medical history on file. History   Social History  . Marital Status: Single    Spouse Name: N/A  . Number of Children: N/A  . Years of Education: N/A   Occupational History  . Not on file.   Social History Main Topics  . Smoking status: Current Every Day Smoker    Last Attempt to Quit: 12/04/2010  . Smokeless tobacco: Never Used  . Alcohol Use: 1.2 - 1.8 oz/week    2-3 Standard drinks or equivalent per week     Comment: liquor or beer; stated has increased with social meetings  . Drug Use: No  . Sexual Activity:    Partners: Male    Birth Control/ Protection: Condom     Comment: accepted condoms   Other Topics Concern  . Not on file   Social History Narrative   Immunization History  Administered Date(s) Administered  . Hepatitis B 01/19/2009, 04/18/2011, 12/03/2011  . Influenza Split  10/08/2011, 10/13/2012  . Influenza Whole 12/22/2008, 12/29/2009, 08/29/2010  . Influenza,inj,Quad PF,36+ Mos 11/30/2013, 10/10/2014  . Pneumococcal Polysaccharide-23 03/12/2007, 04/09/2012   Allergies  Allergen Reactions  . Lisinopril Swelling    Review of Systems  Constitutional: Positive for fatigue.  HENT: Positive for dental problem.   Eyes: Negative.   Respiratory: Negative.   Cardiovascular: Negative.   Gastrointestinal: Negative.  Negative for nausea and diarrhea.  Endocrine: Negative.   Genitourinary: Negative.   Musculoskeletal: Negative.   Allergic/Immunologic: Negative.   Neurological: Positive for speech difficulty, weakness and numbness. Negative for dizziness.  Hematological: Negative.        Objective:   Physical Exam  Constitutional: She is oriented to person, place, and time. She appears well-developed and well-nourished.  HENT:  Head: Normocephalic and atraumatic.  Right Ear: External ear normal.  Left Ear: External ear normal.  Mouth/Throat: Oropharynx is clear and moist.  Eyes: Conjunctivae and EOM are normal. Pupils are equal, round, and reactive to light.  Neck: Normal range of motion. Neck supple.  Cardiovascular: Normal rate, normal heart sounds and intact distal pulses.   Pulmonary/Chest: Effort normal and breath sounds normal.  Abdominal: Soft. Bowel sounds are normal.  Musculoskeletal: Normal range of motion.  Neurological: She is alert and oriented to person, place, and time. She has normal reflexes.  Skin: Skin is warm and dry.  Psychiatric: She has a normal mood and affect. Her  behavior is normal. Judgment and thought content normal.       BP 122/86 mmHg  Pulse 69  Temp(Src) 97.8 F (36.6 C) (Oral)  Resp 16  Ht 5\' 4"  (1.626 m)  Wt 158 lb (71.668 kg)  BMI 27.11 kg/m2 Assessment & Plan:  1. Right arm weakness Ms. Sandell has a history of CVA with left side weakness in 2012. Patient complaining of new numbness, tingling and weakness to  right arm.  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 Was a patient of Dr. [name] at [facility]. She has not had a neurology appointment since 2014. . Will send a new referral.   - Ambulatory referral to Neurology  2. Numbness and tingling of right arm  - Ambulatory referral to Neurology  3. Essential hypertension Blood pressure is at goal on current medication regimen. Will continue medications as previously ordered. No proteinuria present, will check BUN and creatinine. Patient has not returned for follow up since December 2015, but has been taking medications consistently.  - POCT urinalysis dipstick - Basic Metabolic Panel  4. Human immunodeficiency virus (HIV) disease Patient has not had an appointment with Dr. Baxter Flattery since 2015. She states that she was waiting for the staff to call her with an appointment. She maintains that she is taking medications consistently. Called and scheduled an appointment for 06/20/2015 at 3 pm.   5. Hemiplegia, late effect of cerebrovascular disease Will send referral to neurology. Patient states that she is out of topiramate, will refill medication until patient can be evaluated by neurology.  - topiramate (TOPAMAX) 50 MG tablet; one tablet po hs for one week then increase to one tablet bid  Dispense: 60 tablet; Refill: 1  6. Arm numbness left Will continue topamax as previously prescribed.  - topiramate (TOPAMAX) 50 MG tablet; one tablet po hs for one week then increase to one tablet bid  Dispense: 60 tablet; Refill: 1  7. Other fatigue Patient complaining of feeling tired and weakness over the past several weeks. Will check CBC w/ differential.  - CBC with Differential  8. Muscle spasm of back - cyclobenzaprine (FLEXERIL) 10 MG tablet; Take 1 tablet (10 mg total) by mouth 3 (three) times daily as needed for muscle spasms.  Dispense: 30 tablet; Refill: 0   9. Tobacco dependence Ms. Prettyman states that she started back smoking several  months ago. Smoking cessation instruction/counseling given:  counseled patient on the dangers of tobacco use, advised patient to stop smoking, and reviewed strategies to maximize success  RTC: As previously scheduled Dorena Dew, FNP

## 2015-06-02 ENCOUNTER — Encounter: Payer: Self-pay | Admitting: Family Medicine

## 2015-06-02 DIAGNOSIS — F17200 Nicotine dependence, unspecified, uncomplicated: Secondary | ICD-10-CM | POA: Insufficient documentation

## 2015-06-05 ENCOUNTER — Other Ambulatory Visit: Payer: Self-pay

## 2015-06-05 DIAGNOSIS — R200 Anesthesia of skin: Secondary | ICD-10-CM

## 2015-06-05 DIAGNOSIS — I69959 Hemiplegia and hemiparesis following unspecified cerebrovascular disease affecting unspecified side: Secondary | ICD-10-CM

## 2015-06-05 MED ORDER — TOPIRAMATE 50 MG PO TABS: ORAL_TABLET | ORAL | Status: DC

## 2015-06-05 NOTE — Telephone Encounter (Signed)
Re-sent to pharmacy. Pharmacy did not receive last week. Thanks!

## 2015-06-06 ENCOUNTER — Other Ambulatory Visit: Payer: Medicaid Other

## 2015-06-10 ENCOUNTER — Other Ambulatory Visit: Payer: Self-pay | Admitting: Internal Medicine

## 2015-06-14 ENCOUNTER — Other Ambulatory Visit (HOSPITAL_COMMUNITY)
Admission: RE | Admit: 2015-06-14 | Discharge: 2015-06-14 | Disposition: A | Discharge status: Home / Self Care | Payer: Medicaid Other | Source: Ambulatory Visit | Attending: Internal Medicine | Admitting: Internal Medicine

## 2015-06-14 ENCOUNTER — Other Ambulatory Visit: Payer: Medicaid Other

## 2015-06-14 DIAGNOSIS — Z113 Encounter for screening for infections with a predominantly sexual mode of transmission: Secondary | ICD-10-CM | POA: Diagnosis present

## 2015-06-14 DIAGNOSIS — B20 Human immunodeficiency virus [HIV] disease: Secondary | ICD-10-CM

## 2015-06-14 DIAGNOSIS — Z79899 Other long term (current) drug therapy: Secondary | ICD-10-CM

## 2015-06-14 LAB — LIPID PANEL
CHOLESTEROL: 159 mg/dL (ref 0–200)
HDL: 44 mg/dL — AB (ref >=46)
LDL Cholesterol: 94 mg/dL (ref 0–99)
Total CHOL/HDL Ratio: 3.6 Ratio
Triglycerides: 104 mg/dL (ref <150)
VLDL: 21 mg/dL (ref 0–40)

## 2015-06-15 LAB — RPR

## 2015-06-15 LAB — T-HELPER CELL (CD4) - (RCID CLINIC ONLY)
CD4 T CELL HELPER: 24 % — AB (ref 33–55)
CD4 T Cell Abs: 1250 /uL (ref 400–2700)

## 2015-06-15 LAB — URINE CYTOLOGY ANCILLARY ONLY
Chlamydia: NEGATIVE
Neisseria Gonorrhea: NEGATIVE

## 2015-06-16 LAB — HIV-1 RNA QUANT-NO REFLEX-BLD

## 2015-06-20 ENCOUNTER — Ambulatory Visit: Payer: Medicaid Other | Admitting: Internal Medicine

## 2015-06-21 LAB — HLA B*5701: HLA-B*5701 w/rflx HLA-B High: NEGATIVE

## 2015-06-22 ENCOUNTER — Ambulatory Visit (INDEPENDENT_AMBULATORY_CARE_PROVIDER_SITE_OTHER): Payer: Medicaid Other | Admitting: Neurology

## 2015-06-22 ENCOUNTER — Encounter: Payer: Self-pay | Admitting: Neurology

## 2015-06-22 VITALS — BP 140/94 | HR 70 | Resp 14 | Ht 64.0 in | Wt 159.4 lb

## 2015-06-22 DIAGNOSIS — R208 Other disturbances of skin sensation: Secondary | ICD-10-CM

## 2015-06-22 DIAGNOSIS — I69959 Hemiplegia and hemiparesis following unspecified cerebrovascular disease affecting unspecified side: Secondary | ICD-10-CM | POA: Diagnosis not present

## 2015-06-22 DIAGNOSIS — R200 Anesthesia of skin: Secondary | ICD-10-CM | POA: Diagnosis not present

## 2015-06-22 DIAGNOSIS — R269 Unspecified abnormalities of gait and mobility: Secondary | ICD-10-CM

## 2015-06-22 DIAGNOSIS — R202 Paresthesia of skin: Secondary | ICD-10-CM

## 2015-06-22 DIAGNOSIS — G5620 Lesion of ulnar nerve, unspecified upper limb: Secondary | ICD-10-CM | POA: Insufficient documentation

## 2015-06-22 DIAGNOSIS — Z21 Asymptomatic human immunodeficiency virus [HIV] infection status: Secondary | ICD-10-CM | POA: Diagnosis not present

## 2015-06-22 DIAGNOSIS — G5622 Lesion of ulnar nerve, left upper limb: Secondary | ICD-10-CM | POA: Diagnosis not present

## 2015-06-22 DIAGNOSIS — B20 Human immunodeficiency virus [HIV] disease: Secondary | ICD-10-CM

## 2015-06-22 MED ORDER — GABAPENTIN 300 MG PO CAPS: 300.0000 mg | ORAL_CAPSULE | Freq: Three times a day (TID) | ORAL | Status: DC

## 2015-06-22 NOTE — Progress Notes (Signed)
[facility]  PATIENT: Abigail Medina DOB: 1966-06-27  REFERRING DOCTOR OR PCP:  Liston Alba SOURCE: patient, records on EMR and imaging on PACS  _________________________________   HISTORICAL  CHIEF COMPLAINT:  Chief Complaint  Patient presents with  . History of CVA    Sts. she had a stroke in 2012.  She was previously seen by Dr. [name] for same--sts. hasn't seen him in about 2 yrs.  She has continued taking Aspirin 81mg  daily.  She has residual spasticity in left hand, residual left sided facial drooping and numbness, and left arm has felt heavy since 2012 stroke.  Today she is here for new c/o onset 2 mos. ago of frequent episodes of right sided facial numbness, right eye "jumps" and she feels very nervous, like she may pass out.  Sts. episodes are very brief--maybe  . Numbness    a min. or so, then pass.  She also c/o increasing aching pian in right wrist/hand over the last year--sts. she has had emg/ncs bilat ue and that Dr. [name] told her she had ? carpal tunnel syndrome in both wrists.  She does not have wrist splints.  She also c/o mid and lower back pain radiating down back of left leg to foot.  Sts. she has intermittent numbness bilat lower legs.[place]  . Back Pain    HISTORY OF PRESENT ILLNESS:  I had the pleasure seeing you patient, Abigail Medina, at [facility] for neurologic consultation regarding her strokes and more recent right sided numbness.   She has HIV (diagnosed 2000) and is treated with Truvada and Tivicay.    In February 2012, she presented with left sided weakness and numbness. The weakness was much worse in the hand and the face than in the leg. Numbness was on the left side. Her right side had no weakness or numbness at the time. She had minimal recovery after the stroke and continued to have numbness and weakness in the left hand and face. Over the past few months she has noted that there is more numbness on the left  and she is now having numbness on the right, in the hands and feet.     At times, she will also get some intermittent numbness in the right face that the right eye twitches intermittently.     She notes that her balance is worse recently and she has fallen sometimes, especially as she turns. If she sits on the toilet her legs become numb on both sides.  On 04/15/2012, she had a nerve conduction study of both arms. 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 The median nerves were normal. She did not have EMG testing at that time.   A year earlier, she had nerve conduction and EMG testing left arm and that test showed left ulnar neuropathy. Limited EMG did not show radiculopathy.  I personally reviewed MRI and CT images from the PACS system.   The CT scan of the head 06/01/2011 showed chronic right frontal wedge-shaped stroke and a small wedge-shaped right cerebellar hemisphere stroke. The MRI from 01/25/2011 showed that the cerebellar stroke was chronic at that time and the frontal stroke was acute at that time.   MRI of the cervical spine 08/28/11 just showed minimal DDD and the spinal cord was normal.   Laboratory tests and records from her 2012 hospital stay were also evaluated.  REVIEW OF SYSTEMS: Constitutional: No fevers, chills, sweats, or change in appetite Eyes: No visual changes, double vision,  eye pain Ear, nose and throat: No hearing loss, ear pain, nasal congestion, sore throat Cardiovascular: No chest pain, palpitations Respiratory: No shortness of breath at rest or with exertion.   No wheezes GastrointestinaI: No nausea, vomiting, diarrhea, abdominal pain, fecal incontinence Genitourinary: No dysuria, urinary retention or frequency.  No nocturia. Musculoskeletal: No neck pain, back pain Integumentary: No rash, pruritus, skin lesions Neurological: as above Psychiatric: No depression at this time.  No anxiety Endocrine: No palpitations, diaphoresis, change in appetite, change in  weigh or increased thirst Hematologic/Lymphatic: No anemia, purpura, petechiae. Allergic/Immunologic: No itchy/runny eyes, nasal congestion, recent allergic reactions, rashes  ALLERGIES: Allergies  Allergen Reactions  . Lisinopril Swelling    HOME MEDICATIONS:  Current outpatient prescriptions:  .  aspirin 81 MG chewable tablet, Chew 1 tablet (81 mg total) by mouth daily., Disp: 30 tablet, Rfl: 2 .  atenolol (TENORMIN) 50 MG tablet, TAKE 1 TABLET BY MOUTH EVERY DAY, Disp: 30 tablet, Rfl: 5 .  cetirizine (ZYRTEC) 10 MG tablet, Take 1 tablet (10 mg total) by mouth daily., Disp: 30 tablet, Rfl: 11 .  cimetidine (TAGAMET) 200 MG tablet, Take 1 tablet (200 mg total) by mouth 2 (two) times daily., Disp: 30 tablet, Rfl: 2 .  cyclobenzaprine (FLEXERIL) 10 MG tablet, Take 1 tablet (10 mg total) by mouth 3 (three) times daily as needed for muscle spasms., Disp: 30 tablet, Rfl: 0 .  fluticasone (FLONASE) 50 MCG/ACT nasal spray, Place 2 sprays into the nose daily., Disp: , Rfl:  .  hydrochlorothiazide (HYDRODIURIL) 25 MG tablet, TAKE 1 TABLET BY MOUTH EVERY DAY, Disp: 90 tablet, Rfl: 3 .  hydrOXYzine (ATARAX/VISTARIL) 25 MG tablet, Take 25 mg by mouth at bedtime., Disp: , Rfl:  .  TIVICAY 50 MG tablet, TAKE 1 TABLET BY MOUTH EVERY DAY, Disp: 30 tablet, Rfl: 3 .  topiramate (TOPAMAX) 50 MG tablet, one tablet po hs for one week then increase to one tablet bid, Disp: 60 tablet, Rfl: 1 .  traZODone (DESYREL) 50 MG tablet, TAKE 1/2 TABLET BY MOUTH EVERY DAY AT BEDTIME MAY INCREASE TO 1 TABLET IF NEEDED, Disp: 30 tablet, Rfl: 2 .  TRUVADA 200-300 MG per tablet, TAKE 1 TABLET BY MOUTH EVERY DAY, Disp: 30 tablet, Rfl: 3 .  ergocalciferol (VITAMIN D2) 50000 UNITS capsule, Take 1 capsule (50,000 Units total) by mouth once a week. (Patient not taking: Reported on 05/30/2015), Disp: 4 capsule, Rfl: 12 .  [DISCONTINUED] lisinopril (PRINIVIL,ZESTRIL) 10 MG tablet, TAKE 1 TABLET BY MOUTH EVERY DAY, Disp: 30 tablet,  Rfl: 2  PAST MEDICAL HISTORY: Past Medical History  Diagnosis Date  . HIV (human immunodeficiency virus infection)   . Hypertension   . Stroke   . Seizures   . Vision abnormalities     PAST SURGICAL HISTORY: History reviewed. No pertinent past surgical history.  FAMILY HISTORY: Family History  Problem Relation Age of Onset  . Hypertension Mother     SOCIAL HISTORY:  History   Social History  . Marital Status: Single    Spouse Name: N/A  . Number of Children: N/A  . Years of Education: N/A   Occupational History  . Not on file.   Social History Main Topics  . Smoking status: Former Smoker    Quit date: 02/04/2011  . Smokeless tobacco: Never Used  . Alcohol Use: 1.2 - 1.8 oz/week    2-3 Standard drinks or equivalent per week     Comment: liquor or beer; stated has increased with social meetings  .  Drug Use: No  . Sexual Activity:    Partners: Male    Birth Control/ Protection: Condom     Comment: accepted condoms   Other Topics Concern  . Not on file   Social History Narrative     PHYSICAL EXAM  Filed Vitals:   06/22/15 1355  BP: 140/94  Pulse: 70  Resp: 14  Height: 5\' 4"  (1.626 m)  Weight: 159 lb 6.4 oz (72.303 kg)    Body mass index is 27.35 kg/(m^2).   General: The patient is well-developed and well-nourished and in no acute distress  Eyes:  Funduscopic exam shows normal optic discs and retinal vessels.  Neck: The neck is supple, no carotid bruits are noted.  The neck is nontender.  Cardiovascular: The heart has a regular rate and rhythm with a normal S1 and S2. There were no murmurs, gallops or rubs. Lungs are clear to auscultation.  Skin: Extremities are without significant edema.  Neurologic Exam  Mental status: The patient is alert and oriented x 3 at the time of the examination. The patient has apparent normal recent and remote memory, with an apparently normal attention span and concentration ability.   Speech is  normal.  Cranial nerves: Extraocular movements are full. Pupils are equal, round, and reactive to light and accomodation.  There is reduced facial sensation to soft touch bilaterally. Facial strength is reduced in lower 2/3 of left face.  Trapezius and sternocleidomastoid strength is normal. No dysarthria is noted.  The tongue is midline, and the patient has symmetric elevation of the soft palate. No obvious hearing deficits are noted.  Motor:  Muscle bulk is normal.   Tone is increased in the left arm > leg. Strength is  4+ / 5 in right ulnar innervated hand muscles, 5/5 in right APD, 4- to 4/5 in left hand muscles, 4+/5 in left leg muscles.   Sensory: Sensory testing shows reduced sensation to temperature over right hypothenar eminence and reduced left arm/leg sensation to touch/temp/vibration.  Coordination: Cerebellar testing reveals poor left  finger-nose-finger and heel-to-shin bilaterally.  Gait and station: Station is mildly wide but Romberg sign is equivocal. Her gait is wide and left spastic and her turn is unbalanced. She cannot tandem walk.  Reflexes: Deep tendon reflexes are increased on the left.   Plantar responses are flexor.  Other:  She has Tinel's signs at both elbows worse on the right.    DIAGNOSTIC DATA (LABS, IMAGING, TESTING) - I reviewed patient records, labs, notes, testing and imaging myself where available.  Lab Results  Component Value Date   WBC 3.5* 05/30/2015   HGB 15.0 05/30/2015   HCT 42.9 05/30/2015   MCV 91.1 05/30/2015   PLT 162 05/30/2015      Component Value Date/Time   NA 141 05/30/2015 1611   K 3.5 05/30/2015 1611   CL 105 05/30/2015 1611   CO2 29 05/30/2015 1611   GLUCOSE 87 05/30/2015 1611   BUN 14 05/30/2015 1611   CREATININE 1.05 05/30/2015 1611   CREATININE 0.85 06/01/2011 0027   CALCIUM 9.3 05/30/2015 1611   PROT 7.0 06/02/2014 1213   ALBUMIN 4.1 06/02/2014 1213   AST 24 06/02/2014 1213   ALT 26 06/02/2014 1213   ALKPHOS 79  06/02/2014 1213   BILITOT 1.5* 06/02/2014 1213   GFRNONAA 81 03/10/2014 1055   GFRNONAA >60 06/01/2011 0027   GFRAA >89 03/10/2014 1055   GFRAA >60 06/01/2011 0027   Lab Results  Component Value Date  CHOL 159 06/14/2015   HDL 44* 06/14/2015   LDLCALC 94 06/14/2015   TRIG 104 06/14/2015   CHOLHDL 3.6 06/14/2015   Lab Results  Component Value Date   HGBA1C  01/18/2011    5.1 (NOTE)                                                                       According to the ADA Clinical Practice Recommendations for 2011, when HbA1c is used as a screening test:   >=6.5%   Diagnostic of Diabetes Mellitus           (if abnormal result  is confirmed)  5.7-6.4%   Increased risk of developing Diabetes Mellitus  References:Diagnosis and Classification of Diabetes Mellitus,Diabetes WNIO,2703,50(KXFGH 1):S62-S69 and Standards of Medical Care in         Diabetes - 2011,Diabetes Care,2011,34  (Suppl 1):S11-S61.   No results found for: VITAMINB12 Lab Results  Component Value Date   TSH 2.003 05/05/2014       ASSESSMENT AND PLAN  Hemiplegia, late effect of cerebrovascular disease - Plan: MR Brain W Wo Contrast, CANCELED: MR Brain Wo Contrast  Numbness and tingling of right arm - Plan: NCV with EMG(electromyography), MR Brain W Wo Contrast, CANCELED: MR Brain Wo Contrast  Arm numbness left - Plan: NCV with EMG(electromyography), MR Brain W Wo Contrast, CANCELED: MR Brain Wo Contrast  HIV (human immunodeficiency virus infection) - Plan: MR Brain W Wo Contrast  Gait disturbance - Plan: MR Brain W Wo Contrast, CANCELED: MR Brain Wo Contrast  Ulnar neuritis, left - Plan: NCV with EMG(electromyography)    In summary, Abigail Medina is a 49 year old woman a right posterior frontal stroke in February 2012 and has sequela with left spasticity, weakness and numbness. She appears to have superimposed ulnar neuropathies that have likely worsened over the past few years.   I am concerned that she is  having more numbness on the right and also has noted more difficulty with her gait we need to determine if she is having additional strokes or other CNS process (has HIV).  Additionally I will reevaluate the NCV and EMG of both arms to determine if the ulnar neuropathies have progressed to a point where surgery would be recommended. Gabapentin will be added to help with the dysesthesias.  I will see her for the NCV/EMG and further follow-up will be next as needed after that test. She should call us if she has new or worsening neurologic symptoms.   Richard A. Felecia Shelling, MD, PhD 07/17/9936, 1:69 PM Certified in Neurology, Clinical Neurophysiology, Sleep Medicine, Pain Medicine and Neuroimaging  [facility] [address] [place], [place] [address] [phone]

## 2015-07-04 ENCOUNTER — Telehealth: Payer: Self-pay | Admitting: Neurology

## 2015-07-04 DIAGNOSIS — I6990 Unspecified sequelae of unspecified cerebrovascular disease: Secondary | ICD-10-CM

## 2015-07-04 DIAGNOSIS — I69959 Hemiplegia and hemiparesis following unspecified cerebrovascular disease affecting unspecified side: Secondary | ICD-10-CM

## 2015-07-04 MED ORDER — CLONAZEPAM 0.5 MG PO TABS: ORAL_TABLET | ORAL | Status: DC

## 2015-07-04 NOTE — Telephone Encounter (Signed)
Rx. for Clonazepam 0.5mg  #2 up front for pt. to pick up tomorrow/fim

## 2015-07-04 NOTE — Telephone Encounter (Signed)
Attempted to contact pt. at call back # in message--but phone rang without being answered, and I eventually got a message that just said "system on, system on."  No opportunity to leave a message/fim

## 2015-07-04 NOTE — Telephone Encounter (Signed)
Patient requesting medication to calm her down before her scan on 07/06/2015. Best call back is [phone].

## 2015-07-06 ENCOUNTER — Encounter (INDEPENDENT_AMBULATORY_CARE_PROVIDER_SITE_OTHER): Payer: Medicaid Other | Admitting: Neurology

## 2015-07-06 ENCOUNTER — Ambulatory Visit
Admission: RE | Admit: 2015-07-06 | Discharge: 2015-07-06 | Disposition: A | Discharge status: Home / Self Care | Payer: Medicaid Other | Source: Ambulatory Visit | Attending: Neurology | Admitting: Neurology

## 2015-07-06 DIAGNOSIS — R200 Anesthesia of skin: Secondary | ICD-10-CM

## 2015-07-06 DIAGNOSIS — R202 Paresthesia of skin: Secondary | ICD-10-CM

## 2015-07-06 DIAGNOSIS — R269 Unspecified abnormalities of gait and mobility: Secondary | ICD-10-CM

## 2015-07-06 DIAGNOSIS — I69959 Hemiplegia and hemiparesis following unspecified cerebrovascular disease affecting unspecified side: Secondary | ICD-10-CM

## 2015-07-06 DIAGNOSIS — B20 Human immunodeficiency virus [HIV] disease: Secondary | ICD-10-CM

## 2015-07-06 MED ORDER — GADOBENATE DIMEGLUMINE 529 MG/ML IV SOLN
14.0000 mL | Freq: Once | INTRAVENOUS | Status: AC | PRN
  Administered 2015-07-06: 14 mL via INTRAVENOUS

## 2015-07-10 NOTE — Telephone Encounter (Signed)
-----   Message from Britt Bottom, MD sent at 07/09/2015  1:00 PM EDT ----- MRI of the brain shows the stroke in 2012 and 2 smaller strokes on the other side of the brain that occurred sometime in the past. Because I have been some changes in the last 4 years, I would like to get a carotid Doppler ultrasound and an MR angiogram of the intracranial arteries to make sure that the arteries are not blocked.

## 2015-07-10 NOTE — Telephone Encounter (Signed)
I have spoken with Abigail Medina this afternoon and per RAS, advised that mri brain shows the 2012 stroke, and 2 other smaller strokes that are not new, that because there are changes in her mri in the last 4 yrs, she should have carotid duplex and intracranial mra to r/o causes of cva's.  She verbalized understanding of same.  Carotid duplex and mra ordered/fim

## 2015-07-11 ENCOUNTER — Other Ambulatory Visit: Payer: Self-pay | Admitting: Neurology

## 2015-07-11 ENCOUNTER — Ambulatory Visit (INDEPENDENT_AMBULATORY_CARE_PROVIDER_SITE_OTHER): Payer: Self-pay | Admitting: Neurology

## 2015-07-11 ENCOUNTER — Telehealth: Payer: Self-pay | Admitting: Neurology

## 2015-07-11 ENCOUNTER — Encounter (INDEPENDENT_AMBULATORY_CARE_PROVIDER_SITE_OTHER): Payer: Medicaid Other | Admitting: Neurology

## 2015-07-11 DIAGNOSIS — R208 Other disturbances of skin sensation: Secondary | ICD-10-CM | POA: Diagnosis not present

## 2015-07-11 DIAGNOSIS — R200 Anesthesia of skin: Secondary | ICD-10-CM

## 2015-07-11 DIAGNOSIS — R202 Paresthesia of skin: Principal | ICD-10-CM

## 2015-07-11 DIAGNOSIS — G5622 Lesion of ulnar nerve, left upper limb: Secondary | ICD-10-CM

## 2015-07-11 DIAGNOSIS — Z0289 Encounter for other administrative examinations: Secondary | ICD-10-CM

## 2015-07-11 NOTE — Telephone Encounter (Signed)
Patient is here today for a NCV/EMG and requesting medication for her MRI scheduled for next week.

## 2015-07-12 NOTE — Progress Notes (Signed)
   Date:  7/26/21016  Name:Abigail Medina DOB:  1966-09-15  HISTORY: Abigail Medina is a 49 yo woman who is HIV-positive had a stroke in 2012 causing left arm weakness.    She reports numbness in the ulnar aspect of both arms.  She has old left hand weakness but reports more recent (several months) weakness in the right hand.   NERVE CONDUCTION STUDIES: Bilateral ulnar motor responses had normal distal latencies.  Amplitudes were normal.   Conduction velocity was normal in the forearm and at the elbow.  Bilateral median motor responses had normal distal latencies.  Amplitudes were normal.   Conduction velocity was normal in the forearm.  Ulnar and median sensory responses were normal bilaterally. Marland Kitchen   EMG STUDIES:  Needle EMG of select muscles of the right arm was performed.   The deltoid, triceps, biceps, EDC and APB MUAPs were normal in configuration and had normal recruitment.   The Flexor carpi ulnaris and first dorsal interosseous muscles showed some polyphasic motor units with mildly neuropathic recruitment.  IMPRESSION:  Although the NCV studies were normal, the EMG is suggestive of an ulnar neuropathy at the elbow.   Consider cervical spine imaging to rule out contribution from a C8 radiculopathy   Marciana Uplinger A. Felecia Shelling, MD, PhD Certified in Neurology, [place] Neurophysiology, Sleep Medicine, Pain Medicine and Neuroimaging  [facility] [address] [place], [place] [address] [phone]

## 2015-07-13 ENCOUNTER — Ambulatory Visit (INDEPENDENT_AMBULATORY_CARE_PROVIDER_SITE_OTHER): Payer: Medicaid Other

## 2015-07-13 ENCOUNTER — Ambulatory Visit: Payer: Medicaid Other | Admitting: Neurology

## 2015-07-13 DIAGNOSIS — I69959 Hemiplegia and hemiparesis following unspecified cerebrovascular disease affecting unspecified side: Secondary | ICD-10-CM

## 2015-07-13 DIAGNOSIS — I6990 Unspecified sequelae of unspecified cerebrovascular disease: Secondary | ICD-10-CM

## 2015-07-17 NOTE — Progress Notes (Signed)
Preliminary Carotid Duplex Study   Final can be found under Media Tab   Physician Impression: This study is negative for hemodynamically significant stenosis involving extracranial carotid arteries bilaterally.  Mild plaque was observed in the left carotid bulb.

## 2015-07-19 ENCOUNTER — Ambulatory Visit
Admission: RE | Admit: 2015-07-19 | Discharge: 2015-07-19 | Disposition: A | Discharge status: Home / Self Care | Payer: Medicaid Other | Source: Ambulatory Visit | Attending: Neurology | Admitting: Neurology

## 2015-07-19 ENCOUNTER — Encounter (INDEPENDENT_AMBULATORY_CARE_PROVIDER_SITE_OTHER): Payer: Medicaid Other | Admitting: Neurology

## 2015-07-19 DIAGNOSIS — R200 Anesthesia of skin: Secondary | ICD-10-CM

## 2015-07-19 DIAGNOSIS — I6990 Unspecified sequelae of unspecified cerebrovascular disease: Secondary | ICD-10-CM | POA: Diagnosis not present

## 2015-07-19 DIAGNOSIS — I69959 Hemiplegia and hemiparesis following unspecified cerebrovascular disease affecting unspecified side: Secondary | ICD-10-CM | POA: Diagnosis not present

## 2015-07-19 DIAGNOSIS — R208 Other disturbances of skin sensation: Secondary | ICD-10-CM | POA: Diagnosis not present

## 2015-07-19 DIAGNOSIS — R202 Paresthesia of skin: Principal | ICD-10-CM

## 2015-07-24 NOTE — Telephone Encounter (Signed)
Spoke to patient. Gave results and instructions. Patient verbalized understanding.  

## 2015-07-24 NOTE — Telephone Encounter (Signed)
-----   Message from Britt Bottom, MD sent at 07/21/2015  8:57 AM EDT ----- Please let Abigail Medina know the MR angiogram of the brain arteries was normal. The MRI of the neck shows some disc degenerative changes but nothing bad enough to cause arm weakness.    Therefore the symptoms in the arm appear to be due to some damage to one of the nerves in the arm (ulnar nerve). These often improve on the roll but if she feels she is worsening we need to see Abigail Medina back.

## 2015-08-05 ENCOUNTER — Other Ambulatory Visit: Payer: Self-pay | Admitting: Internal Medicine

## 2015-08-11 ENCOUNTER — Other Ambulatory Visit: Payer: Self-pay | Admitting: *Deleted

## 2015-08-11 MED ORDER — EMTRICITABINE-TENOFOVIR DF 200-300 MG PO TABS: 1.0000 | ORAL_TABLET | Freq: Every day | ORAL | Status: DC

## 2015-08-11 MED ORDER — DOLUTEGRAVIR SODIUM 50 MG PO TABS: 50.0000 mg | ORAL_TABLET | Freq: Every day | ORAL | Status: DC

## 2015-09-03 ENCOUNTER — Other Ambulatory Visit: Payer: Self-pay | Admitting: Family Medicine

## 2015-09-10 ENCOUNTER — Other Ambulatory Visit: Payer: Self-pay | Admitting: Internal Medicine

## 2015-09-11 ENCOUNTER — Other Ambulatory Visit: Payer: Self-pay | Admitting: *Deleted

## 2015-09-11 DIAGNOSIS — I10 Essential (primary) hypertension: Secondary | ICD-10-CM

## 2015-09-11 MED ORDER — HYDROCHLOROTHIAZIDE 25 MG PO TABS
25.0000 mg | ORAL_TABLET | Freq: Every day | ORAL | Status: DC
Start: 2015-09-11 — End: 2015-12-03

## 2015-09-11 NOTE — Telephone Encounter (Signed)
Pt requesting refill for HCTZ for her blood pressure.  She is now being [facility].  Will ask the pharmacy to obtain the next refill from that office.

## 2015-10-03 ENCOUNTER — Ambulatory Visit (INDEPENDENT_AMBULATORY_CARE_PROVIDER_SITE_OTHER): Payer: Medicaid Other | Admitting: Internal Medicine

## 2015-10-03 ENCOUNTER — Telehealth: Payer: Self-pay | Admitting: *Deleted

## 2015-10-03 ENCOUNTER — Other Ambulatory Visit: Payer: Self-pay | Admitting: *Deleted

## 2015-10-03 VITALS — BP 137/97 | HR 66 | Temp 97.9°F | Ht 63.0 in | Wt 158.5 lb

## 2015-10-03 DIAGNOSIS — B20 Human immunodeficiency virus [HIV] disease: Secondary | ICD-10-CM

## 2015-10-03 DIAGNOSIS — J302 Other seasonal allergic rhinitis: Secondary | ICD-10-CM | POA: Diagnosis not present

## 2015-10-03 DIAGNOSIS — Z23 Encounter for immunization: Secondary | ICD-10-CM

## 2015-10-03 MED ORDER — EMTRICITABINE-TENOFOVIR AF 200-25 MG PO TABS: 1.0000 | ORAL_TABLET | Freq: Every day | ORAL | Status: DC

## 2015-10-03 MED ORDER — DOLUTEGRAVIR SODIUM 50 MG PO TABS: 50.0000 mg | ORAL_TABLET | Freq: Every day | ORAL | Status: DC

## 2015-10-03 MED ORDER — FLUNISOLIDE 25 MCG/ACT (0.025%) NA SOLN: 2.0000 | Freq: Two times a day (BID) | NASAL | Status: DC

## 2015-10-03 NOTE — Telephone Encounter (Signed)
PA needed for new nasal spray, Flunisolide.  PA completed and faxed to Medicaid.

## 2015-10-03 NOTE — Progress Notes (Signed)
Patient ID: Abigail Medina, female   DOB: 1966-06-27, 49 y.o.   MRN: [id number]       Patient ID: Abigail Medina, female   DOB: 02/06/1966, 49 y.o.   MRN: [id number]  HPI  49yo F with HIV disease hx of thrush, CD 4 count of   1250/ VL<20 in June 2016  On DLG-truvada. Still having occasional seasonal allergies. Otherwise in good state of health.  Outpatient Encounter Prescriptions as of 10/03/2015  Medication Sig  . aspirin 81 MG chewable tablet Chew 1 tablet (81 mg total) by mouth daily.  Marland Kitchen atenolol (TENORMIN) 50 MG tablet TAKE 1 TABLET BY MOUTH EVERY DAY  . cetirizine (ZYRTEC) 10 MG tablet Take 1 tablet (10 mg total) by mouth daily.  . cimetidine (TAGAMET) 200 MG tablet Take 1 tablet (200 mg total) by mouth 2 (two) times daily.  . clonazePAM (KLONOPIN) 0.5 MG tablet Take one tablet 30 minutes prior to mri.  Take the second tablet with you to your appointment, and may take it if needed.  DO NOT DRINK ALCOHOL, DRIVER OR OPERATE DANGEROUS EQUIPMENT WHILE TAKING THIS MEDICINE  . cyclobenzaprine (FLEXERIL) 10 MG tablet Take 1 tablet (10 mg total) by mouth 3 (three) times daily as needed for muscle spasms.  . dolutegravir (TIVICAY) 50 MG tablet Take 1 tablet (50 mg total) by mouth daily.  Marland Kitchen emtricitabine-tenofovir (TRUVADA) 200-300 MG per tablet Take 1 tablet by mouth daily.  . ergocalciferol (VITAMIN D2) 50000 UNITS capsule Take 1 capsule (50,000 Units total) by mouth once a week. (Patient not taking: Reported on 05/30/2015)  . fluticasone (FLONASE) 50 MCG/ACT nasal spray Place 2 sprays into the nose daily.  Marland Kitchen gabapentin (NEURONTIN) 300 MG capsule Take 1 capsule (300 mg total) by mouth 3 (three) times daily.  . hydrochlorothiazide (HYDRODIURIL) 25 MG tablet Take 1 tablet (25 mg total) by mouth daily.  . hydrOXYzine (ATARAX/VISTARIL) 25 MG tablet Take 25 mg by mouth at bedtime.  . topiramate (TOPAMAX) 50 MG tablet one tablet po hs for one week then increase to one tablet bid  . traZODone (DESYREL) 50 MG  tablet TAKE 1/2 TABLET BY MOUTH EVERY DAY AT BEDTIME MAY INCREASE TO 1 TABLET IF NEEDED   No facility-administered encounter medications on file as of 10/03/2015.     Patient Active Problem List   Diagnosis Date Noted  . Gait disturbance 06/22/2015  . 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 Tobacco dependence 06/02/2015  . Numbness and tingling of right arm 05/30/2015  . Right arm weakness 05/30/2015  . Arm numbness left 10/10/2014  . Swelling of upper lip 08/05/2014  . Vitamin D deficiency 08/05/2014  . Allergic rhinitis 05/25/2013  . Decreased leukocytes 05/25/2013  . 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 Allergy to drug 08/20/2012  . Cheek swelling 08/20/2012  . Polypharmacy 08/20/2012  . HIV (human immunodeficiency virus infection) ([place]) 08/20/2012  . Edema of face 07/15/2012  . HLD (hyperlipidemia) 07/15/2012  . BP (high blood pressure) 07/15/2012  . Cold hands 01/30/2012  . Cerebrovascular accident, late effects 01/30/2012  . Chronic sinusitis 10/08/2011  . Sinusitis 10/08/2011  . Depression with anxiety 07/15/2011  . Insomnia 07/15/2011  . Folliculitis 42/70/6237  . Seborrhea capitis 04/18/2011  . Seborrheic dermatitis 03/07/2011  . Hemiplegia, late effect of cerebrovascular disease ([place]) 01/25/2011  . VAGINAL DISCHARGE 12/26/2010  . NEVUS 08/29/2010  . AMENORRHEA 07/19/2010  . CELLULITIS AND ABSCESS OF UNSPECIFIED SITE 05/21/2010  . SKIN RASH 05/21/2010  . THRUSH 12/28/2009  . ACUTE BRONCHITIS  01/19/2009  . Human immunodeficiency virus (HIV) disease ([place]) 08/06/2007  . Essential hypertension 08/06/2007     Health Maintenance Due  Topic Date Due  . TETANUS/TDAP  03/03/1983  . COLONOSCOPY  03/02/2014  . INFLUENZA VACCINE  07/17/2015     Review of Systems Review of Systems  Constitutional: Negative for fever, chills, diaphoresis, activity change, appetite change, fatigue and unexpected weight change.  HENT: Negative for congestion, sore throat, rhinorrhea,  sneezing, trouble swallowing and sinus pressure.  Eyes: Negative for photophobia and visual disturbance.  Respiratory: Negative for cough, chest tightness, shortness of breath, wheezing and stridor.  Cardiovascular: Negative for chest pain, palpitations and leg swelling.  Gastrointestinal: Negative for nausea, vomiting, abdominal pain, diarrhea, constipation, blood in stool, abdominal distention and anal bleeding.  Genitourinary: Negative for dysuria, hematuria, flank pain and difficulty urinating.  Musculoskeletal: Negative for myalgias, back pain, joint swelling, arthralgias and gait problem.  Skin: Negative for color change, pallor, rash and wound.  Neurological: Negative for dizziness, tremors, weakness and light-headedness.  Hematological: Negative for adenopathy. Does not bruise/bleed easily.  Psychiatric/Behavioral: Negative for behavioral problems, confusion, sleep disturbance, dysphoric mood, decreased concentration and agitation.    Physical Exam   BP 137/97 mmHg  Pulse 66  Temp(Src) 97.9 F (36.6 C) (Oral)  Ht 5\' 3"  (1.6 m)  Wt 158 lb 8 oz (71.895 kg)  BMI 28.08 kg/m2  LMP 08/04/2014 Physical Exam  Constitutional:  oriented to person, place, and time. appears well-developed and well-nourished. No distress.  HENT: [place]/AT, PERRLA, no scleral icterus Mouth/Throat: Oropharynx is clear and moist. No oropharyngeal exudate.  Cardiovascular: Normal rate, regular rhythm and normal heart sounds. Exam reveals no gallop and no friction rub.  No murmur heard.  Pulmonary/Chest: Effort normal and breath sounds normal. No respiratory distress.  has no wheezes.  Neck = supple, no nuchal rigidity Lymphadenopathy: no cervical adenopathy. No axillary adenopathy Neurological: alert and oriented to person, place, and time.  Skin: Skin is warm and dry. No rash noted. No erythema.  Psychiatric: a normal mood and affect.  behavior is normal.   Lab Results  Component Value Date   CD4TCELL 24*  06/14/2015   Lab Results  Component Value Date   CD4TABS 1250 06/14/2015   CD4TABS 410 06/02/2014   CD4TABS 340* 03/10/2014   Lab Results  Component Value Date   HIV1RNAQUANT <20 06/14/2015   Lab Results  Component Value Date   HEPBSAB NEG 03/10/2014   No results found for: RPR  CBC Lab Results  Component Value Date   WBC 3.5* 05/30/2015   RBC 4.71 05/30/2015   HGB 15.0 05/30/2015   HCT 42.9 05/30/2015   PLT 162 05/30/2015   MCV 91.1 05/30/2015   MCH 31.8 05/30/2015   MCHC 35.0 05/30/2015   RDW 14.2 05/30/2015   LYMPHSABS 1.9 05/30/2015   MONOABS 0.2 05/30/2015   EOSABS 0.1 05/30/2015   BASOSABS 0.0 05/30/2015   BMET Lab Results  Component Value Date   NA 141 05/30/2015   K 3.5 05/30/2015   CL 105 05/30/2015   CO2 29 05/30/2015   GLUCOSE 87 05/30/2015   BUN 14 05/30/2015   CREATININE 1.05 05/30/2015   CALCIUM 9.3 05/30/2015   GFRNONAA 81 03/10/2014   GFRAA >89 03/10/2014     Assessment and Plan   hiv disease = will change her to tivicay plus descovy (will no longer need truvada)  Health mainteannce = will need mammo, pap. Give flu today  Seasonal allergies = will give flunisolide  and d/c fluticasone

## 2015-10-06 ENCOUNTER — Ambulatory Visit: Payer: Medicaid Other

## 2015-10-13 ENCOUNTER — Ambulatory Visit: Payer: Medicaid Other

## 2015-11-01 NOTE — Telephone Encounter (Signed)
Re-faxed PA to Medicaid, not previously approved per [facility].

## 2015-11-27 NOTE — Telephone Encounter (Signed)
[facility] ran through their system.  Not approved yet.  RN will refax to Medicaid for the 3rd time.

## 2015-12-03 ENCOUNTER — Other Ambulatory Visit: Payer: Self-pay | Admitting: Internal Medicine

## 2015-12-03 ENCOUNTER — Other Ambulatory Visit: Payer: Self-pay | Admitting: Family Medicine

## 2016-01-01 NOTE — Telephone Encounter (Signed)
NOT APPROVED by Medicaid per [facility].  MD please advise about continuing rx.

## 2016-01-04 ENCOUNTER — Ambulatory Visit: Payer: Medicaid Other | Admitting: Internal Medicine

## 2016-01-05 ENCOUNTER — Other Ambulatory Visit: Payer: Self-pay | Admitting: Internal Medicine

## 2016-01-05 DIAGNOSIS — Z1231 Encounter for screening mammogram for malignant neoplasm of breast: Secondary | ICD-10-CM

## 2016-01-16 ENCOUNTER — Ambulatory Visit
Admission: RE | Admit: 2016-01-16 | Discharge: 2016-01-16 | Disposition: A | Discharge status: Home / Self Care | Payer: Medicaid Other | Source: Ambulatory Visit | Attending: Internal Medicine | Admitting: Internal Medicine

## 2016-01-16 DIAGNOSIS — Z1231 Encounter for screening mammogram for malignant neoplasm of breast: Secondary | ICD-10-CM

## 2016-02-13 ENCOUNTER — Encounter: Payer: Self-pay | Admitting: Internal Medicine

## 2016-02-13 ENCOUNTER — Ambulatory Visit (INDEPENDENT_AMBULATORY_CARE_PROVIDER_SITE_OTHER): Payer: Medicaid Other | Admitting: Internal Medicine

## 2016-02-13 VITALS — BP 117/82 | HR 72 | Temp 98.2°F | Ht 63.0 in | Wt 160.2 lb

## 2016-02-13 DIAGNOSIS — K120 Recurrent oral aphthae: Secondary | ICD-10-CM | POA: Diagnosis not present

## 2016-02-13 DIAGNOSIS — B20 Human immunodeficiency virus [HIV] disease: Secondary | ICD-10-CM

## 2016-02-13 DIAGNOSIS — G4700 Insomnia, unspecified: Secondary | ICD-10-CM

## 2016-02-13 DIAGNOSIS — F411 Generalized anxiety disorder: Secondary | ICD-10-CM | POA: Diagnosis not present

## 2016-02-13 LAB — LIPID PANEL
CHOL/HDL RATIO: 3.7 ratio (ref <=5.0)
CHOLESTEROL: 193 mg/dL (ref 125–200)
HDL: 52 mg/dL (ref >=46)
LDL Cholesterol: 104 mg/dL (ref <130)
Triglycerides: 187 mg/dL — ABNORMAL HIGH (ref <150)
VLDL: 37 mg/dL — AB (ref <30)

## 2016-02-13 LAB — CBC WITH DIFFERENTIAL/PLATELET
BASOS ABS: 0 10*3/uL (ref 0.0–0.1)
Basophils Relative: 0 % (ref 0–1)
EOS ABS: 0.1 10*3/uL (ref 0.0–0.7)
Eosinophils Relative: 3 % (ref 0–5)
HEMATOCRIT: 43.7 % (ref 36.0–46.0)
Hemoglobin: 14.8 g/dL (ref 12.0–15.0)
LYMPHS ABS: 1.4 10*3/uL (ref 0.7–4.0)
LYMPHS PCT: 43 % (ref 12–46)
MCH: 30.8 pg (ref 26.0–34.0)
MCHC: 33.9 g/dL (ref 30.0–36.0)
MCV: 91 fL (ref 78.0–100.0)
MPV: 10.5 fL (ref 8.6–12.4)
Monocytes Absolute: 0.4 10*3/uL (ref 0.1–1.0)
Monocytes Relative: 13 % — ABNORMAL HIGH (ref 3–12)
NEUTROS PCT: 41 % — AB (ref 43–77)
Neutro Abs: 1.3 10*3/uL — ABNORMAL LOW (ref 1.7–7.7)
PLATELETS: 178 10*3/uL (ref 150–400)
RBC: 4.8 MIL/uL (ref 3.87–5.11)
RDW: 13.7 % (ref 11.5–15.5)
WBC: 3.2 10*3/uL — ABNORMAL LOW (ref 4.0–10.5)

## 2016-02-13 LAB — COMPLETE METABOLIC PANEL WITH GFR
ALT: 20 U/L (ref 6–29)
AST: 22 U/L (ref 10–35)
Albumin: 3.9 g/dL (ref 3.6–5.1)
Alkaline Phosphatase: 85 U/L (ref 33–130)
BILIRUBIN TOTAL: 1 mg/dL (ref 0.2–1.2)
BUN: 14 mg/dL (ref 7–25)
CO2: 27 mmol/L (ref 20–31)
CREATININE: 0.93 mg/dL (ref 0.50–1.05)
Calcium: 9.2 mg/dL (ref 8.6–10.4)
Chloride: 104 mmol/L (ref 98–110)
GFR, EST AFRICAN AMERICAN: 82 mL/min (ref >=60)
GFR, Est Non African American: 71 mL/min (ref >=60)
GLUCOSE: 91 mg/dL (ref 65–99)
Potassium: 3.8 mmol/L (ref 3.5–5.3)
Sodium: 139 mmol/L (ref 135–146)
TOTAL PROTEIN: 7 g/dL (ref 6.1–8.1)

## 2016-02-13 MED ORDER — MAGIC MOUTHWASH W/LIDOCAINE: 5.0000 mL | Freq: Three times a day (TID) | ORAL | Status: DC | PRN

## 2016-02-13 MED ORDER — TRAZODONE HCL 50 MG PO TABS: 25.0000 mg | ORAL_TABLET | Freq: Every evening | ORAL | Status: DC | PRN

## 2016-02-13 MED ORDER — PAROXETINE HCL 20 MG PO TABS: 20.0000 mg | ORAL_TABLET | Freq: Every day | ORAL | Status: DC

## 2016-02-13 NOTE — Progress Notes (Signed)
Patient ID: Abigail Medina, female   DOB: 07-Aug-1966, 50 y.o.   MRN: [id number]     RFV: follow up for HIV disease  Patient ID: Abigail Medina, female   DOB: 1966/02/01, 50 y.o.   MRN: [id number]  HPI cabrini is a 50yo F with HIV disase, hx of thrush. CD 4 count of 1250/VL<20 (spring 2016) on tivicay and descovy. She recently had dental extraction roughly 2-3 wk ago since then she feels her tongue, lateral aspects, feel raw and cut. She reports that she had this similar sensation many years ago  Ros: + anxiety and insomnia. She has ran out of clonazepam.  Outpatient Encounter Prescriptions as of 02/13/2016  Medication Sig  . aspirin 81 MG chewable tablet Chew 1 tablet (81 mg total) by mouth daily.  Marland Kitchen atenolol (TENORMIN) 50 MG tablet TAKE 1 TABLET BY MOUTH EVERY DAY  . cetirizine (ZYRTEC) 10 MG tablet Take 1 tablet (10 mg total) by mouth daily.  . cimetidine (TAGAMET) 200 MG tablet Take 1 tablet (200 mg total) by mouth 2 (two) times daily.  . clonazePAM (KLONOPIN) 0.5 MG tablet Take one tablet 30 minutes prior to mri.  Take the second tablet with you to your appointment, and may take it if needed.  DO NOT DRINK ALCOHOL, DRIVER OR OPERATE DANGEROUS EQUIPMENT WHILE TAKING THIS MEDICINE  . cyclobenzaprine (FLEXERIL) 10 MG tablet Take 1 tablet (10 mg total) by mouth 3 (three) times daily as needed for muscle spasms.  . dolutegravir (TIVICAY) 50 MG tablet Take 1 tablet (50 mg total) by mouth daily.  Marland Kitchen emtricitabine-tenofovir AF (DESCOVY) 200-25 MG tablet Take 1 tablet by mouth daily.  . ergocalciferol (VITAMIN D2) 50000 UNITS capsule Take 1 capsule (50,000 Units total) by mouth once a week.  . flunisolide (NASALIDE) 25 MCG/ACT (0.025%) SOLN Place 2 sprays into the nose 2 (two) times daily.  Marland Kitchen gabapentin (NEURONTIN) 300 MG capsule Take 1 capsule (300 mg total) by mouth 3 (three) times daily.  . hydrochlorothiazide (HYDRODIURIL) 25 MG tablet TAKE 1 TABLET BY MOUTH EVERY DAY  . hydrOXYzine (ATARAX/VISTARIL)  25 MG tablet Take 25 mg by mouth at bedtime.  . topiramate (TOPAMAX) 50 MG tablet TAKE 1 TABLET BY MOUTH AT BEDTIME FOR 1 WEEK THEN INCREASE TO TWICE A DAY  . traZODone (DESYREL) 50 MG tablet TAKE 1/2 TABLET BY MOUTH EVERY DAY AT BEDTIME MAY INCREASE TO 1 TABLET IF NEEDED   No facility-administered encounter medications on file as of 02/13/2016.     Patient Active Problem List   Diagnosis Date Noted  . Gait disturbance 06/22/2015  . 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 Tobacco dependence 06/02/2015  . Numbness and tingling of right arm 05/30/2015  . Right arm weakness 05/30/2015  . Arm numbness left 10/10/2014  . Swelling of upper lip 08/05/2014  . Vitamin D deficiency 08/05/2014  . Allergic rhinitis 05/25/2013  . Decreased leukocytes 05/25/2013  . 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 Allergy to drug 08/20/2012  . Cheek swelling 08/20/2012  . Polypharmacy 08/20/2012  . HIV (human immunodeficiency virus infection) ([place]) 08/20/2012  . Edema of face 07/15/2012  . HLD (hyperlipidemia) 07/15/2012  . BP (high blood pressure) 07/15/2012  . Cold hands 01/30/2012  . Cerebrovascular accident, late effects 01/30/2012  . Chronic sinusitis 10/08/2011  . Sinusitis 10/08/2011  . Depression with anxiety 07/15/2011  . Insomnia 07/15/2011  . Folliculitis [id number]  . Seborrhea capitis 04/18/2011  . Seborrheic dermatitis 03/07/2011  . 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  VAGINAL DISCHARGE 12/26/2010  . NEVUS 08/29/2010  . AMENORRHEA 07/19/2010  . CELLULITIS AND ABSCESS OF UNSPECIFIED SITE 05/21/2010  . SKIN RASH 05/21/2010  . THRUSH 12/28/2009  . ACUTE BRONCHITIS 01/19/2009  . Human immunodeficiency virus (HIV) disease ([place]) 08/06/2007  . Essential hypertension 08/06/2007     Health Maintenance Due  Topic Date Due  . TETANUS/TDAP  03/03/1983  . COLONOSCOPY  03/02/2014     Review of Systems 10 point ros is negative except what is mentioned in  hpi Physical Exam   BP 117/82 mmHg  Pulse 72  Temp(Src) 98.2 F (36.8 C) (Oral)  Ht 5\' 3"  (1.6 m)  Wt 160 lb 4 oz (72.689 kg)  BMI 28.39 kg/m2  LMP 08/04/2014 Physical Exam  Constitutional:  oriented to person, place, and time. appears well-developed and well-nourished. No distress.  HENT: [place]/AT, PERRLA, no scleral icterus Mouth/Throat: Oropharynx is clear and moist. No oropharyngeal exudate. Poor dentition with numerous missing teeth. Partial dentures in upper jaw. Lateral aspects of tongue R>L ulcers. No thrush or exudates Cardiovascular: Normal rate, regular rhythm and normal heart sounds. Exam reveals no gallop and no friction rub.  No murmur heard.  Pulmonary/Chest: Effort normal and breath sounds normal. No respiratory distress.  has no wheezes.  Neck = supple, no nuchal rigidity Abdominal: Soft. Bowel sounds are normal.  exhibits no distension. There is no tenderness.  Lymphadenopathy: no cervical adenopathy. No axillary adenopathy Neurological: alert and oriented to person, place, and time.  Skin: Skin is warm and dry. No rash noted. No erythema.  Psychiatric: a normal mood and affect.  behavior is normal.   Lab Results  Component Value Date   CD4TCELL 24* 06/14/2015   Lab Results  Component Value Date   CD4TABS 1250 06/14/2015   CD4TABS 410 06/02/2014   CD4TABS 340* 03/10/2014   Lab Results  Component Value Date   HIV1RNAQUANT <20 06/14/2015   Lab Results  Component Value Date   HEPBSAB NEG 03/10/2014   No results found for: RPR  CBC Lab Results  Component Value Date   WBC 3.5* 05/30/2015   RBC 4.71 05/30/2015   HGB 15.0 05/30/2015   HCT 42.9 05/30/2015   PLT 162 05/30/2015   MCV 91.1 05/30/2015   MCH 31.8 05/30/2015   MCHC 35.0 05/30/2015   RDW 14.2 05/30/2015   LYMPHSABS 1.9 05/30/2015   MONOABS 0.2 05/30/2015   EOSABS 0.1 05/30/2015   BASOSABS 0.0 05/30/2015   BMET Lab Results  Component Value Date   NA 141 05/30/2015   K 3.5 05/30/2015    CL 105 05/30/2015   CO2 29 05/30/2015   GLUCOSE 87 05/30/2015   BUN 14 05/30/2015   CREATININE 1.05 05/30/2015   CALCIUM 9.3 05/30/2015   GFRNONAA 81 03/10/2014   GFRAA >89 03/10/2014     Assessment and Plan  hiv disease = will continue on current regimen. Will check labs  Health maintenance = will schedule next pap and mammo at next visit  aphous ulcers = will try magic mouthwash to see if it helps her symptoms  Insomnia = better sleep hygiene. Will do a trial of trazodone  Anxiety = would try paroxetine to see if it helps her symptoms

## 2016-02-14 LAB — T-HELPER CELL (CD4) - (RCID CLINIC ONLY)
CD4 T CELL ABS: 540 /uL (ref 400–2700)
CD4 T CELL HELPER: 38 % (ref 33–55)

## 2016-02-14 LAB — RPR

## 2016-02-15 ENCOUNTER — Other Ambulatory Visit: Payer: Self-pay | Admitting: *Deleted

## 2016-02-15 DIAGNOSIS — K120 Recurrent oral aphthae: Secondary | ICD-10-CM

## 2016-02-15 LAB — HIV-1 RNA QUANT-NO REFLEX-BLD
HIV 1 RNA Quant: 28 copies/mL — ABNORMAL HIGH (ref <20)
HIV-1 RNA Quant, Log: 1.45 Log copies/mL — ABNORMAL HIGH (ref <1.30)

## 2016-02-15 MED ORDER — MAGIC MOUTHWASH W/LIDOCAINE: 5.0000 mL | Freq: Three times a day (TID) | ORAL | Status: DC | PRN

## 2016-03-19 ENCOUNTER — Ambulatory Visit: Payer: Medicaid Other | Admitting: Internal Medicine

## 2016-03-25 ENCOUNTER — Other Ambulatory Visit: Payer: Self-pay | Admitting: Internal Medicine

## 2016-03-27 ENCOUNTER — Other Ambulatory Visit: Payer: Self-pay | Admitting: *Deleted

## 2016-03-27 MED ORDER — ATENOLOL 50 MG PO TABS: 50.0000 mg | ORAL_TABLET | Freq: Every day | ORAL | Status: DC

## 2016-04-04 ENCOUNTER — Encounter: Payer: Self-pay | Admitting: Family Medicine

## 2016-04-04 NOTE — Telephone Encounter (Signed)
This encounter was created in error - please disregard.  This encounter was created in error - please disregard.

## 2016-04-26 ENCOUNTER — Ambulatory Visit: Payer: Medicaid Other | Admitting: Family Medicine

## 2016-05-16 ENCOUNTER — Other Ambulatory Visit: Payer: Self-pay

## 2016-05-16 ENCOUNTER — Ambulatory Visit (INDEPENDENT_AMBULATORY_CARE_PROVIDER_SITE_OTHER): Payer: Medicaid Other | Admitting: Family Medicine

## 2016-05-16 ENCOUNTER — Encounter: Payer: Self-pay | Admitting: Family Medicine

## 2016-05-16 DIAGNOSIS — R220 Localized swelling, mass and lump, head: Secondary | ICD-10-CM

## 2016-05-16 DIAGNOSIS — M62830 Muscle spasm of back: Secondary | ICD-10-CM | POA: Diagnosis not present

## 2016-05-16 DIAGNOSIS — E559 Vitamin D deficiency, unspecified: Secondary | ICD-10-CM

## 2016-05-16 DIAGNOSIS — I10 Essential (primary) hypertension: Secondary | ICD-10-CM | POA: Diagnosis not present

## 2016-05-16 DIAGNOSIS — K219 Gastro-esophageal reflux disease without esophagitis: Secondary | ICD-10-CM | POA: Diagnosis not present

## 2016-05-16 DIAGNOSIS — Z9109 Other allergy status, other than to drugs and biological substances: Secondary | ICD-10-CM

## 2016-05-16 DIAGNOSIS — R5383 Other fatigue: Secondary | ICD-10-CM | POA: Insufficient documentation

## 2016-05-16 DIAGNOSIS — G629 Polyneuropathy, unspecified: Secondary | ICD-10-CM | POA: Diagnosis not present

## 2016-05-16 DIAGNOSIS — Z91048 Other nonmedicinal substance allergy status: Secondary | ICD-10-CM

## 2016-05-16 LAB — CBC WITH DIFFERENTIAL/PLATELET
BASOS PCT: 0 %
Basophils Absolute: 0 cells/uL (ref 0–200)
Eosinophils Absolute: 112 cells/uL (ref 15–500)
Eosinophils Relative: 4 %
HEMATOCRIT: 45.6 % — AB (ref 35.0–45.0)
Hemoglobin: 15.4 g/dL (ref 11.7–15.5)
LYMPHS PCT: 53 %
Lymphs Abs: 1484 cells/uL (ref 850–3900)
MCH: 31.3 pg (ref 27.0–33.0)
MCHC: 33.8 g/dL (ref 32.0–36.0)
MCV: 92.7 fL (ref 80.0–100.0)
MONO ABS: 168 {cells}/uL — AB (ref 200–950)
MONOS PCT: 6 %
MPV: 10.7 fL (ref 7.5–12.5)
NEUTROS PCT: 37 %
Neutro Abs: 1036 cells/uL — ABNORMAL LOW (ref 1500–7800)
PLATELETS: 170 10*3/uL (ref 140–400)
RBC: 4.92 MIL/uL (ref 3.80–5.10)
RDW: 13.7 % (ref 11.0–15.0)
WBC: 2.8 10*3/uL — AB (ref 3.8–10.8)

## 2016-05-16 LAB — POCT URINALYSIS DIP (DEVICE)
Glucose, UA: NEGATIVE mg/dL
Hgb urine dipstick: NEGATIVE
Ketones, ur: NEGATIVE mg/dL
Leukocytes, UA: NEGATIVE
Nitrite: NEGATIVE
Protein, ur: 30 mg/dL — AB
Specific Gravity, Urine: 1.02 (ref 1.005–1.030)
Urobilinogen, UA: 4 mg/dL — ABNORMAL HIGH (ref 0.0–1.0)
pH: 6 (ref 5.0–8.0)

## 2016-05-16 LAB — COMPLETE METABOLIC PANEL WITHOUT GFR
ALT: 23 U/L (ref 6–29)
AST: 23 U/L (ref 10–35)
Albumin: 4.1 g/dL (ref 3.6–5.1)
Alkaline Phosphatase: 74 U/L (ref 33–130)
BUN: 11 mg/dL (ref 7–25)
CO2: 25 mmol/L (ref 20–31)
Calcium: 9.1 mg/dL (ref 8.6–10.4)
Chloride: 104 mmol/L (ref 98–110)
Creat: 0.82 mg/dL (ref 0.50–1.05)
GFR, Est African American: >89 mL/min
GFR, Est Non African American: 83 mL/min
Glucose, Bld: 88 mg/dL (ref 65–99)
Potassium: 3.9 mmol/L (ref 3.5–5.3)
Sodium: 141 mmol/L (ref 135–146)
Total Bilirubin: 1.2 mg/dL (ref 0.2–1.2)
Total Protein: 7 g/dL (ref 6.1–8.1)

## 2016-05-16 LAB — VITAMIN B12: Vitamin B-12: 366 pg/mL (ref 200–1100)

## 2016-05-16 MED ORDER — CETIRIZINE HCL 10 MG PO TABS: 10.0000 mg | ORAL_TABLET | Freq: Every day | ORAL | Status: DC

## 2016-05-16 MED ORDER — CYCLOBENZAPRINE HCL 10 MG PO TABS: 10.0000 mg | ORAL_TABLET | Freq: Three times a day (TID) | ORAL | Status: DC | PRN

## 2016-05-16 MED ORDER — CIMETIDINE 200 MG PO TABS: 200.0000 mg | ORAL_TABLET | Freq: Two times a day (BID) | ORAL | Status: DC

## 2016-05-16 MED ORDER — HYDROCHLOROTHIAZIDE 25 MG PO TABS
25.0000 mg | ORAL_TABLET | Freq: Every day | ORAL | Status: DC
Start: 2016-05-16 — End: 2017-04-13

## 2016-05-16 MED ORDER — GABAPENTIN 400 MG PO CAPS: 400.0000 mg | ORAL_CAPSULE | Freq: Three times a day (TID) | ORAL | Status: DC

## 2016-05-16 NOTE — Patient Instructions (Signed)
DASH Eating Plan  DASH stands for "Dietary Approaches to Stop Hypertension." The DASH eating plan is a healthy eating plan that has been shown to reduce high blood pressure (hypertension). Additional health benefits may include reducing the risk of type 2 diabetes mellitus, heart disease, and stroke. The DASH eating plan may also help with weight loss.  WHAT DO I NEED TO KNOW ABOUT THE DASH EATING PLAN?  For the DASH eating plan, you will follow these general guidelines:  · Choose foods with a percent daily value for sodium of less than 5% (as listed on the food label).  · Use salt-free seasonings or herbs instead of table salt or sea salt.  · Check with your health care provider or pharmacist before using salt substitutes.  · Eat lower-sodium products, often labeled as "lower sodium" or "no salt added."  · Eat fresh foods.  · Eat more vegetables, fruits, and low-fat dairy products.  · Choose whole grains. Look for the word "whole" as the first word in the ingredient list.  · Choose fish and skinless chicken or turkey more often than red meat. Limit fish, poultry, and meat to 6 oz (170 g) each day.  · Limit sweets, desserts, sugars, and sugary drinks.  · Choose heart-healthy fats.  · Limit cheese to 1 oz (28 g) per day.  · Eat more home-cooked food and less restaurant, buffet, and fast food.  · Limit fried foods.  · Cook foods using methods other than frying.  · Limit canned vegetables. If you do use them, rinse them well to decrease the sodium.  · When eating at a restaurant, ask that your food be prepared with less salt, or no salt if possible.  WHAT FOODS CAN I EAT?  Seek help from a dietitian for individual calorie needs.  Grains  Whole grain or whole wheat bread. Brown rice. Whole grain or whole wheat pasta. Quinoa, bulgur, and whole grain cereals. Low-sodium cereals. Corn or whole wheat flour tortillas. Whole grain cornbread. Whole grain crackers. Low-sodium crackers.  Vegetables  Fresh or frozen vegetables  (raw, steamed, roasted, or grilled). Low-sodium or reduced-sodium tomato and vegetable juices. Low-sodium or reduced-sodium tomato sauce and paste. Low-sodium or reduced-sodium canned vegetables.   Fruits  All fresh, canned (in natural juice), or frozen fruits.  Meat and Other Protein Products  Ground beef (85% or leaner), grass-fed beef, or beef trimmed of fat. Skinless chicken or turkey. Ground chicken or turkey. Pork trimmed of fat. All fish and seafood. Eggs. Dried beans, peas, or lentils. Unsalted nuts and seeds. Unsalted canned beans.  Dairy  Low-fat dairy products, such as skim or 1% milk, 2% or reduced-fat cheeses, low-fat ricotta or cottage cheese, or plain low-fat yogurt. Low-sodium or reduced-sodium cheeses.  Fats and Oils  Tub margarines without trans fats. Light or reduced-fat mayonnaise and salad dressings (reduced sodium). Avocado. Safflower, olive, or canola oils. Natural peanut or almond butter.  Other  Unsalted popcorn and pretzels.  The items listed above may not be a complete list of recommended foods or beverages. Contact your dietitian for more options.  WHAT FOODS ARE NOT RECOMMENDED?  Grains  White bread. White pasta. White rice. Refined cornbread. Bagels and croissants. Crackers that contain trans fat.  Vegetables  Creamed or fried vegetables. Vegetables in a cheese sauce. Regular canned vegetables. Regular canned tomato sauce and paste. Regular tomato and vegetable juices.  Fruits  Dried fruits. Canned fruit in light or heavy syrup. Fruit juice.  Meat and Other Protein   Products  Fatty cuts of meat. Ribs, chicken wings, bacon, sausage, bologna, salami, chitterlings, fatback, hot dogs, bratwurst, and packaged luncheon meats. Salted nuts and seeds. Canned beans with salt.  Dairy  Whole or 2% milk, cream, half-and-half, and cream cheese. Whole-fat or sweetened yogurt. Full-fat cheeses or blue cheese. Nondairy creamers and whipped toppings. Processed cheese, cheese spreads, or cheese  curds.  Condiments  Onion and garlic salt, seasoned salt, table salt, and sea salt. Canned and packaged gravies. Worcestershire sauce. Tartar sauce. Barbecue sauce. Teriyaki sauce. Soy sauce, including reduced sodium. Steak sauce. Fish sauce. Oyster sauce. Cocktail sauce. Horseradish. Ketchup and mustard. Meat flavorings and tenderizers. Bouillon cubes. Hot sauce. Tabasco sauce. Marinades. Taco seasonings. Relishes.  Fats and Oils  Butter, stick margarine, lard, shortening, ghee, and bacon fat. Coconut, palm kernel, or palm oils. Regular salad dressings.  Other  Pickles and olives. Salted popcorn and pretzels.  The items listed above may not be a complete list of foods and beverages to avoid. Contact your dietitian for more information.  WHERE CAN I FIND MORE INFORMATION?  National Heart, Lung, and Blood Institute: www.nhlbi.nih.gov/health/health-topics/topics/dash/     This information is not intended to replace advice given to you by your health care provider. Make sure you discuss any questions you have with your health care provider.     Document Released: 11/21/2011 Document Revised: 12/23/2014 Document Reviewed: 10/06/2013  Elsevier Interactive Patient Education ©2016 [employer] Inc[employer]

## 2016-05-16 NOTE — Progress Notes (Signed)
Subjective:    Patient ID: Abigail Medina, female    DOB: 03-10-1966, 50 y.o.   MRN: [id number]  HPI [name], a 50 year old female presents with a history of HIV, hypertension, chronic low back pain and left CVA presents for a follow up of chronic conditions. Patient has occasional pain to right arm. She reports that she is having difficulty lifting small items. She has a history of a cerebral vascular accident 4 years ago. She states that she was previously followed by neurologist post CVA, but has not been for follow up in several years. Pain is described as burning on occasion. Current pain is minimally controlled on gabapentin daily.  Patient denies headache, facial weakness, fever, dysuria, nausea, vomiting or diarrhea.   [name] also has a history of hypertension.  She is not exercising and is not adherent to low salt diet. Patient does not check blood pressures at home. a. Patient denies dizziness,  chest pain, dyspnea, irregular heart beat, near-syncope, palpitations and tachypnea.  Cardiovascular risk factors include: hypertension, sedentary lifestyle and smoking/ tobacco exposure. Past Medical History  Diagnosis Date  . HIV (human immunodeficiency virus infection) ([place])   . Hypertension   . Stroke ([place])   . Seizures ([place])   . Vision abnormalities    Social History   Social History  . Marital Status: Single    Spouse Name: N/A  . Number of Children: N/A  . Years of Education: N/A   Occupational History  . Not on file.   Social History Main Topics  . Smoking status: Former Smoker -- 0.25 packs/day    Types: Cigarettes    Start date: 10/17/2015  . Smokeless tobacco: Never Used  . Alcohol Use: 1.2 - 1.8 oz/week    2-3 Standard drinks or equivalent per week     Comment: liquor or beer; stated has increased with social meetings  . Drug Use: No  . Sexual Activity:    Partners: Male    Birth Control/ Protection: Condom     Comment: accepted condoms   Other Topics  Concern  . Not on file   Social History Narrative   Immunization History  Administered Date(s) Administered  . Hepatitis B 01/19/2009, 04/18/2011, 12/03/2011  . Influenza Split 10/08/2011, 10/13/2012  . Influenza Whole 12/22/2008, 12/29/2009, 08/29/2010  . Influenza,inj,Quad PF,36+ Mos 11/30/2013, 10/10/2014, 10/03/2015  . Pneumococcal Polysaccharide-23 03/12/2007, 04/09/2012   Allergies  Allergen Reactions  . Lisinopril Swelling    Review of Systems  Constitutional: Positive for fatigue.  Eyes: Negative.   Respiratory: Negative.   Cardiovascular: Negative.   Gastrointestinal: Positive for heartburn. Negative for nausea and diarrhea.       Heartburn  Endocrine: Negative.   Genitourinary: Negative.   Musculoskeletal: Positive for back pain.  Allergic/Immunologic: Negative.   Neurological: Positive for weakness and numbness. Negative for dizziness.  Hematological: Negative.        Objective:   Physical Exam  Constitutional: She is oriented to person, place, and time. She appears well-developed and well-nourished.  HENT:  Head: Normocephalic and atraumatic.  Right Ear: External ear normal.  Left Ear: External ear normal.  Mouth/Throat: Oropharynx is clear and moist.  Eyes: Conjunctivae and EOM are normal. Pupils are equal, round, and reactive to light.  Neck: Normal range of motion. Neck supple.  Cardiovascular: Normal rate, normal heart sounds and intact distal pulses.   Pulmonary/Chest: Effort normal and breath sounds normal.  Abdominal: Soft. Bowel sounds are normal.  Musculoskeletal: Normal range  of motion.  Neurological: She is alert and oriented to person, place, and time. She has normal reflexes. No sensory deficit. She exhibits abnormal muscle tone. Gait abnormal.  Skin: Skin is warm and dry.  Psychiatric: She has a normal mood and affect. Her behavior is normal. Judgment and thought content normal.     BP 122/88 mmHg  Pulse 71  Temp(Src) 98.3 F (36.8 C)  (Oral)  Resp 16  Ht 5\' 3"  (1.6 m)  Wt 160 lb (72.576 kg)  BMI 28.35 kg/m2  LMP 08/04/2014 Assessment & Plan:  1. Neuropathy (HCC) - gabapentin (NEURONTIN) 400 MG capsule; Take 1 capsule (400 mg total) by mouth 3 (three) times daily.  Dispense: 90 capsule; Refill: 5 - Vitamin B12  2. Muscle spasm of back  - cyclobenzaprine (FLEXERIL) 10 MG tablet; Take 1 tablet (10 mg total) by mouth 3 (three) times daily as needed for muscle spasms.  Dispense: 30 tablet; Refill: 1  3. Gastroesophageal reflux disease without esophagitis - cimetidine (TAGAMET) 200 MG tablet; Take 1 tablet (200 mg total) by mouth 2 (two) times daily.  Dispense: 30 tablet; Refill: 11  4. Essential hypertension Blood pressure is at goal on current medication regimen. The patient is asked to make an attempt to improve diet and exercise patterns to aid in medical management of this problem. - hydrochlorothiazide (HYDRODIURIL) 25 MG tablet; Take 1 tablet (25 mg total) by mouth daily.  Dispense: 30 tablet; Refill: 5 - COMPLETE METABOLIC PANEL WITH GFR  5. Vitamin D deficiency - Vitamin D, 25-hydroxy  6. Other fatigue  - CBC with Differential    RTC: 6 months for hypertension Dorena Dew, FNP

## 2016-05-16 NOTE — Telephone Encounter (Signed)
Refill for zyrtec sent into pharmacy. Thanks!

## 2016-05-17 ENCOUNTER — Other Ambulatory Visit: Payer: Self-pay | Admitting: Family Medicine

## 2016-05-17 DIAGNOSIS — E559 Vitamin D deficiency, unspecified: Secondary | ICD-10-CM

## 2016-05-17 LAB — VITAMIN D 25 HYDROXY (VIT D DEFICIENCY, FRACTURES): VIT D 25 HYDROXY: 18 ng/mL — AB (ref 30–100)

## 2016-05-17 MED ORDER — ERGOCALCIFEROL 1.25 MG (50000 UT) PO CAPS: 50000.0000 [IU] | ORAL_CAPSULE | ORAL | Status: DC

## 2016-05-17 NOTE — Progress Notes (Signed)
Tried to call patient, no answer. NO way to leave message. Will try later. Thanks!

## 2016-05-20 NOTE — Progress Notes (Signed)
Tried to call patient not home, left message with female to have patient call us back. Thanks!

## 2016-05-21 NOTE — Progress Notes (Signed)
Have tried to contact patient multiple times with no success. Will mail letter to patient advising of lab results. Thanks!

## 2016-08-07 ENCOUNTER — Encounter (HOSPITAL_COMMUNITY): Payer: Self-pay | Admitting: Emergency Medicine

## 2016-08-07 ENCOUNTER — Ambulatory Visit (HOSPITAL_COMMUNITY)
Admission: EM | Admit: 2016-08-07 | Discharge: 2016-08-07 | Disposition: A | Discharge status: Home / Self Care | Payer: Medicaid Other | Attending: Family Medicine | Admitting: Family Medicine

## 2016-08-07 DIAGNOSIS — Z23 Encounter for immunization: Secondary | ICD-10-CM

## 2016-08-07 DIAGNOSIS — T148 Other injury of unspecified body region: Secondary | ICD-10-CM

## 2016-08-07 DIAGNOSIS — T07XXXA Unspecified multiple injuries, initial encounter: Secondary | ICD-10-CM

## 2016-08-07 MED ORDER — TETANUS-DIPHTH-ACELL PERTUSSIS 5-2.5-18.5 LF-MCG/0.5 IM SUSP
0.5000 mL | Freq: Once | INTRAMUSCULAR | Status: AC
  Administered 2016-08-07: 0.5 mL via INTRAMUSCULAR

## 2016-08-07 MED ORDER — TETANUS-DIPHTH-ACELL PERTUSSIS 5-2.5-18.5 LF-MCG/0.5 IM SUSP
INTRAMUSCULAR | Status: AC
  Filled 2016-08-07: qty 0.5

## 2016-08-07 NOTE — ED Provider Notes (Signed)
[place]    CSN: [id number] Arrival date & time: 08/07/16  [id number]  First Provider Contact:  First MD Initiated Contact with Patient 08/07/16 1838        History   Chief Complaint Chief Complaint  Patient presents with  . Leg Injury    HPI Abigail Medina is a 50 y.o. female.    Knee Pain  Location:  Leg Time since incident:  2 days Injury: yes   Mechanism of injury: fall   Fall:    Fall occurred:  Down stairs   Entrapped after fall: no   Leg location:  L upper leg Pain details:    Quality:  Aching   Severity:  Mild   Onset quality:  Sudden   Progression:  Unchanged Chronicity:  New Dislocation: no   Tetanus status:  Out of date Prior injury to area:  No Relieved by:  None tried Worsened by:  Nothing Ineffective treatments:  None tried Associated symptoms: no decreased ROM     Past Medical History:  Diagnosis Date  . HIV (human immunodeficiency virus infection) ([place])   . Hypertension   . Seizures ([place])   . Stroke ([place])   . Vision abnormalities     Patient Active Problem List   Diagnosis Date Noted  . Other fatigue 05/16/2016  . Gait disturbance 06/22/2015  . 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 Tobacco dependence 06/02/2015  . Numbness and tingling of right arm 05/30/2015  . Right arm weakness 05/30/2015  . Arm numbness left 10/10/2014  . Swelling of upper lip 08/05/2014  . Vitamin D deficiency 08/05/2014  . Allergic rhinitis 05/25/2013  . Decreased leukocytes 05/25/2013  . 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 Allergy to drug 08/20/2012  . Cheek swelling 08/20/2012  . Polypharmacy 08/20/2012  . HIV (human immunodeficiency virus infection) ([place]) 08/20/2012  . Edema of face 07/15/2012  . HLD (hyperlipidemia) 07/15/2012  . BP (high blood pressure) 07/15/2012  . Cold hands 01/30/2012  . Cerebrovascular accident, late effects 01/30/2012  . Chronic sinusitis 10/08/2011  . Sinusitis 10/08/2011  . Depression with anxiety 07/15/2011  .  Insomnia 07/15/2011  . Folliculitis [id number]  . Seborrhea capitis 04/18/2011  . Seborrheic dermatitis 03/07/2011  . Hemiplegia, late effect of cerebrovascular disease ([place]) 01/25/2011  . VAGINAL DISCHARGE 12/26/2010  . NEVUS 08/29/2010  . AMENORRHEA 07/19/2010  . CELLULITIS AND ABSCESS OF UNSPECIFIED SITE 05/21/2010  . SKIN RASH 05/21/2010  . THRUSH 12/28/2009  . ACUTE BRONCHITIS 01/19/2009  . Human immunodeficiency virus (HIV) disease ([place]) 08/06/2007  . Essential hypertension 08/06/2007    History reviewed. No pertinent surgical history.  OB History    No data available       Home Medications    Prior to Admission medications   Medication Sig Start Date End Date Taking? Authorizing Provider  aspirin 81 MG chewable tablet Chew 1 tablet (81 mg total) by mouth daily. 08/05/14   Dorena Dew, FNP  atenolol (TENORMIN) 50 MG tablet Take 1 tablet (50 mg total) by mouth daily. 03/27/16   Carlyle Basques, MD  cetirizine (ZYRTEC) 10 MG tablet Take 1 tablet (10 mg total) by mouth daily. 05/16/16   Dorena Dew, FNP  cimetidine (TAGAMET) 200 MG tablet Take 1 tablet (200 mg total) by mouth 2 (two) times daily. 05/16/16   Dorena Dew, FNP  cyclobenzaprine (FLEXERIL) 10 MG tablet Take 1 tablet (10 mg total) by mouth 3 (three) times daily as needed for muscle spasms. 05/16/16   Lovett Sox  Durenda Guthrie, FNP  dolutegravir (TIVICAY) 50 MG tablet Take 1 tablet (50 mg total) by mouth daily. 10/03/15   Carlyle Basques, MD  emtricitabine-tenofovir AF (DESCOVY) 200-25 MG tablet Take 1 tablet by mouth daily. 10/03/15   Carlyle Basques, MD  ergocalciferol (VITAMIN D2) 50000 units capsule Take 1 capsule (50,000 Units total) by mouth once a week. 05/17/16   Dorena Dew, FNP  flunisolide (NASALIDE) 25 MCG/ACT (0.025%) SOLN Place 2 sprays into the nose 2 (two) times daily. 10/03/15   Carlyle Basques, MD  gabapentin (NEURONTIN) 400 MG capsule Take 1 capsule (400 mg total) by mouth 3 (three) times daily.  05/16/16   Dorena Dew, FNP  hydrochlorothiazide (HYDRODIURIL) 25 MG tablet Take 1 tablet (25 mg total) by mouth daily. 05/16/16   Dorena Dew, FNP  hydrOXYzine (ATARAX/VISTARIL) 25 MG tablet Take 25 mg by mouth at bedtime.    Historical Provider, MD  magic mouthwash w/lidocaine SOLN Take 5 mLs by mouth 3 (three) times daily as needed for mouth pain. Patient not taking: Reported on 05/16/2016 02/15/16   Carlyle Basques, MD  PARoxetine (PAXIL) 20 MG tablet Take 1 tablet (20 mg total) by mouth daily. 02/13/16   Carlyle Basques, MD  topiramate (TOPAMAX) 50 MG tablet TAKE 1 TABLET BY MOUTH AT BEDTIME FOR 1 WEEK THEN INCREASE TO TWICE A DAY 12/04/15   Dorena Dew, FNP  traZODone (DESYREL) 50 MG tablet Take 0.5 tablets (25 mg total) by mouth at bedtime as needed for sleep. 02/13/16   Carlyle Basques, MD    Family History Family History  Problem Relation Age of Onset  . Hypertension Mother     Social History Social History  Substance Use Topics  . Smoking status: Former Smoker    Packs/day: 0.25    Types: Cigarettes    Start date: 10/17/2015  . Smokeless tobacco: Never Used  . Alcohol use 1.2 - 1.8 oz/week    2 - 3 Standard drinks or equivalent per week     Comment: liquor or beer; stated has increased with social meetings     Allergies   Lisinopril   Review of Systems Review of Systems  Musculoskeletal: Negative for gait problem and joint swelling.  Skin: Positive for wound.  All other systems reviewed and are negative.    Physical Exam Triage Vital Signs ED Triage Vitals  Enc Vitals Group     BP 08/07/16 1831 140/97     Pulse Rate 08/07/16 1831 78     Resp 08/07/16 1831 18     Temp 08/07/16 1831 98.4 F (36.9 C)     Temp Source 08/07/16 1831 Oral     SpO2 08/07/16 1831 100 %     Weight 08/07/16 1831 163 lb (73.9 kg)     Height 08/07/16 1831 5\' 3"  (1.6 m)     Head Circumference --      Peak Flow --      Pain Score 08/07/16 1837 10     Pain Loc --      Pain Edu?  --      Excl. in [place]? --    No data found.   Updated Vital Signs BP 140/97 (BP Location: Right Arm)   Pulse 78   Temp 98.4 F (36.9 C) (Oral)   Resp 18   Ht 5\' 3"  (1.6 m)   Wt 163 lb (73.9 kg)   LMP 08/04/2014   SpO2 100%   BMI 28.87 kg/m   Visual Acuity Right  Eye Distance:   Left Eye Distance:   Bilateral Distance:    Right Eye Near:   Left Eye Near:    Bilateral Near:     Physical Exam  Constitutional: She appears well-developed and well-nourished. No distress.  Skin: Skin is warm and dry. Rash noted.  Abrasion to left lower leg and left elbow, full rom, no sign of infection.  Nursing note and vitals reviewed.    UC Treatments / Results  Labs (all labs ordered are listed, but only abnormal results are displayed) Labs Reviewed - No data to display  EKG  EKG Interpretation None       Radiology No results found.  Procedures Procedures (including critical care time)  Medications Ordered in UC Medications  Tdap (BOOSTRIX) injection 0.5 mL (not administered)     Initial Impression / Assessment and Plan / UC Course  I have reviewed the triage vital signs and the nursing notes.  Pertinent labs & imaging results that were available during my care of the patient were reviewed by me and considered in my medical decision making (see chart for details).  Clinical Course      Final Clinical Impressions(s) / UC Diagnoses   Final diagnoses:  Multiple abrasions    New Prescriptions New Prescriptions   No medications on file     Billy Fischer, MD 08/07/16 1950

## 2016-08-07 NOTE — Discharge Instructions (Signed)
Wash wounds as much as possible, you had a tetanus shot , return if further problems.

## 2016-08-07 NOTE — ED Triage Notes (Signed)
The patient presented to the [facility] with a complaint of left leg pain secondary to a fall that occurred 2 days ago. The patient sustained an abrasion to her left leg that she had dressed upon arrival at the [facility].

## 2016-09-06 ENCOUNTER — Ambulatory Visit: Payer: Medicaid Other | Admitting: Family Medicine

## 2016-10-20 ENCOUNTER — Other Ambulatory Visit: Payer: Self-pay | Admitting: Internal Medicine

## 2016-10-20 DIAGNOSIS — B20 Human immunodeficiency virus [HIV] disease: Secondary | ICD-10-CM

## 2016-10-21 ENCOUNTER — Other Ambulatory Visit: Payer: Self-pay | Admitting: *Deleted

## 2016-10-21 DIAGNOSIS — G629 Polyneuropathy, unspecified: Secondary | ICD-10-CM

## 2016-10-21 DIAGNOSIS — B20 Human immunodeficiency virus [HIV] disease: Secondary | ICD-10-CM

## 2016-10-21 MED ORDER — DOLUTEGRAVIR SODIUM 50 MG PO TABS: 50.0000 mg | ORAL_TABLET | Freq: Every day | ORAL | 5 refills | Status: DC

## 2016-10-21 MED ORDER — EMTRICITABINE-TENOFOVIR AF 200-25 MG PO TABS: 1.0000 | ORAL_TABLET | Freq: Every day | ORAL | 5 refills | Status: DC

## 2016-10-28 ENCOUNTER — Other Ambulatory Visit: Payer: Self-pay | Admitting: Internal Medicine

## 2016-10-28 DIAGNOSIS — B20 Human immunodeficiency virus [HIV] disease: Secondary | ICD-10-CM

## 2016-11-15 ENCOUNTER — Ambulatory Visit: Payer: Medicaid Other | Admitting: Family Medicine

## 2016-11-18 ENCOUNTER — Other Ambulatory Visit: Payer: Self-pay | Admitting: Pharmacist

## 2016-11-18 MED ORDER — METOPROLOL SUCCINATE ER 100 MG PO TB24: 100.0000 mg | ORAL_TABLET | Freq: Every day | ORAL | 5 refills | Status: DC

## 2016-11-30 ENCOUNTER — Other Ambulatory Visit: Payer: Self-pay | Admitting: Internal Medicine

## 2016-11-30 DIAGNOSIS — B20 Human immunodeficiency virus [HIV] disease: Secondary | ICD-10-CM

## 2017-01-16 ENCOUNTER — Encounter (HOSPITAL_COMMUNITY): Payer: Self-pay | Admitting: Emergency Medicine

## 2017-01-16 ENCOUNTER — Ambulatory Visit (HOSPITAL_COMMUNITY)
Admission: EM | Admit: 2017-01-16 | Discharge: 2017-01-16 | Disposition: A | Discharge status: Home / Self Care | Payer: Medicaid Other | Attending: Family Medicine | Admitting: Family Medicine

## 2017-01-16 DIAGNOSIS — M5442 Lumbago with sciatica, left side: Secondary | ICD-10-CM | POA: Diagnosis not present

## 2017-01-16 DIAGNOSIS — G8929 Other chronic pain: Secondary | ICD-10-CM | POA: Diagnosis not present

## 2017-01-16 MED ORDER — DICLOFENAC SODIUM 75 MG PO TBEC: 75.0000 mg | DELAYED_RELEASE_TABLET | Freq: Two times a day (BID) | ORAL | 0 refills | Status: DC

## 2017-01-16 MED ORDER — CYCLOBENZAPRINE HCL 10 MG PO TABS: 10.0000 mg | ORAL_TABLET | Freq: Two times a day (BID) | ORAL | 0 refills | Status: DC | PRN

## 2017-01-16 NOTE — ED Triage Notes (Signed)
Here for constant back pain onset yest and radiates to left leg  Denies inj/trauma  A&O x4... NAD

## 2017-01-16 NOTE — Discharge Instructions (Signed)
You most likely have a strained muscle in your lower back. I have prescribed two medicines for your pain. The first is diclofenac, take 1 tablet twice a day and the other is Flexeril, take 1 tablet twice a day. Flexeril may cause drowsiness so do not drive until you know how this medicine affects you. Also do not drink any alcohol either. You may apply ice and alternate with heat for 15 minutes at a time 4 times daily and for additional pain control you may take tylenol over the counter ever 4 hours but do not take more than 4000 mg a day. Should your pain continue or fail to resolve, follow up with your primary care provider or return to clinic as needed.  °

## 2017-01-16 NOTE — ED Provider Notes (Signed)
CSN: [id number]     Arrival date & time 01/16/17  1731 History   None    Chief Complaint  Patient presents with  . Back Pain   (Consider location/radiation/quality/duration/timing/severity/associated sxs/prior Treatment) 51 year old female presents with chief complaint of lower back pain. She states she has had this pain for 1 year and has been treated for it before. She has been prescribed flexeril in the past with moderate relief but she has run out of her prescription. She denies any recent history of trauma. No loss of sensation in the extremities, and no other red flag signs.   The history is provided by the patient.  Back Pain    Past Medical History:  Diagnosis Date  . HIV (human immunodeficiency virus infection) ([place])   . Hypertension   . Seizures ([place])   . Stroke ([place])   . Vision abnormalities    History reviewed. No pertinent surgical history. Family History  Problem Relation Age of Onset  . Hypertension Mother    Social History  Substance Use Topics  . Smoking status: Former Smoker    Packs/day: 0.25    Types: Cigarettes    Start date: 10/17/2015  . Smokeless tobacco: Never Used  . Alcohol use 1.2 - 1.8 oz/week    2 - 3 Standard drinks or equivalent per week     Comment: liquor or beer; stated has increased with social meetings   OB History    No data available     Review of Systems  Reason unable to perform ROS: as covered in HPI.  Musculoskeletal: Positive for back pain.  All other systems reviewed and are negative.   Allergies  Lisinopril  Home Medications   Prior to Admission medications   Medication Sig Start Date End Date Taking? Authorizing Provider  aspirin 81 MG chewable tablet Chew 1 tablet (81 mg total) by mouth daily. 08/05/14  Yes Dorena Dew, FNP  cetirizine (ZYRTEC) 10 MG tablet Take 1 tablet (10 mg total) by mouth daily. 05/16/16  Yes Dorena Dew, FNP  cimetidine (TAGAMET) 200 MG tablet Take 1 tablet (200 mg total) by mouth 2  (two) times daily. 05/16/16  Yes Dorena Dew, FNP  DESCOVY 200-25 MG tablet TAKE 1 TABLET BY MOUTH DAILY 10/29/16  Yes Carlyle Basques, MD  emtricitabine-tenofovir AF (DESCOVY) 200-25 MG tablet Take 1 tablet by mouth daily. 10/21/16  Yes Carlyle Basques, MD  ergocalciferol (VITAMIN D2) 50000 units capsule Take 1 capsule (50,000 Units total) by mouth once a week. 05/17/16  Yes Dorena Dew, FNP  flunisolide (NASALIDE) 25 MCG/ACT (0.025%) SOLN Place 2 sprays into the nose 2 (two) times daily. 10/03/15  Yes Carlyle Basques, MD  gabapentin (NEURONTIN) 400 MG capsule Take 1 capsule (400 mg total) by mouth 3 (three) times daily. 05/16/16  Yes Dorena Dew, FNP  hydrochlorothiazide (HYDRODIURIL) 25 MG tablet Take 1 tablet (25 mg total) by mouth daily. 05/16/16  Yes Dorena Dew, FNP  hydrOXYzine (ATARAX/VISTARIL) 25 MG tablet Take 25 mg by mouth at bedtime.   Yes Historical Provider, MD  metoprolol succinate (TOPROL XL) 100 MG 24 hr tablet Take 1 tablet (100 mg total) by mouth daily. Take with or immediately following a meal. 11/18/16  Yes Carlyle Basques, MD  PARoxetine (PAXIL) 20 MG tablet Take 1 tablet (20 mg total) by mouth daily. 02/13/16  Yes Carlyle Basques, MD  TIVICAY 50 MG tablet TAKE 1 TABLET(50 MG) BY MOUTH DAILY 12/02/16  Yes Carlyle Basques,  MD  topiramate (TOPAMAX) 50 MG tablet TAKE 1 TABLET BY MOUTH AT BEDTIME FOR 1 WEEK THEN INCREASE TO TWICE A DAY 12/04/15  Yes Dorena Dew, FNP  traZODone (DESYREL) 50 MG tablet Take 0.5 tablets (25 mg total) by mouth at bedtime as needed for sleep. 02/13/16  Yes Carlyle Basques, MD  cyclobenzaprine (FLEXERIL) 10 MG tablet Take 1 tablet (10 mg total) by mouth 3 (three) times daily as needed for muscle spasms. 05/16/16   Dorena Dew, FNP  cyclobenzaprine (FLEXERIL) 10 MG tablet Take 1 tablet (10 mg total) by mouth 2 (two) times daily as needed for muscle spasms. 01/16/17   Barnet Glasgow, NP  diclofenac (VOLTAREN) 75 MG EC tablet Take 1 tablet (75 mg  total) by mouth 2 (two) times daily. 01/16/17   Barnet Glasgow, NP  magic mouthwash w/lidocaine SOLN Take 5 mLs by mouth 3 (three) times daily as needed for mouth pain. Patient not taking: Reported on 05/16/2016 02/15/16   Carlyle Basques, MD   Meds Ordered and Administered this Visit  Medications - No data to display  BP (!) 155/105 (BP Location: Left Arm)   Pulse 65   Temp 98.7 F (37.1 C) (Oral)   Resp 20   LMP 08/04/2014   SpO2 100%  No data found.   Physical Exam  Constitutional: She is oriented to person, place, and time. She appears well-developed and well-nourished. No distress.  Cardiovascular: Normal rate and regular rhythm.   Pulmonary/Chest: Effort normal and breath sounds normal.  Musculoskeletal: She exhibits tenderness.       Back:  Positive left leg raise  Neurological: She is alert and oriented to person, place, and time.  Skin: Skin is warm and dry. Capillary refill takes less than 2 seconds. She is not diaphoretic.  Psychiatric: She has a normal mood and affect.  Nursing note and vitals reviewed.   Urgent Care Course     Procedures (including critical care time)  Labs Review Labs Reviewed - No data to display  Imaging Review No results found.   Visual Acuity Review  Right Eye Distance:   Left Eye Distance:   Bilateral Distance:    Right Eye Near:   Left Eye Near:    Bilateral Near:         MDM   1. Chronic left-sided low back pain with left-sided sciatica    You most likely have a strained muscle in your lower back. I have prescribed two medicines for your pain. The first is diclofenac, take 1 tablet twice a day and the other is Flexeril, take 1 tablet twice a day. Flexeril may cause drowsiness so do not drive until you know how this medicine affects you. Also do not drink any alcohol either. You may apply ice and alternate with heat for 15 minutes at a time 4 times daily and for additional pain control you may take tylenol over the counter  ever 4 hours but do not take more than 4000 mg a day. Should your pain continue or fail to resolve, follow up with your primary care provider or return to clinic as needed.     Barnet Glasgow, NP 01/16/17 1759

## 2017-01-23 ENCOUNTER — Encounter: Payer: Self-pay | Admitting: Internal Medicine

## 2017-01-23 ENCOUNTER — Ambulatory Visit (INDEPENDENT_AMBULATORY_CARE_PROVIDER_SITE_OTHER): Payer: Medicaid Other | Admitting: Internal Medicine

## 2017-01-23 VITALS — BP 138/96 | HR 67 | Temp 98.2°F | Ht 63.0 in | Wt 165.0 lb

## 2017-01-23 DIAGNOSIS — R4589 Other symptoms and signs involving emotional state: Secondary | ICD-10-CM

## 2017-01-23 DIAGNOSIS — M5432 Sciatica, left side: Secondary | ICD-10-CM

## 2017-01-23 DIAGNOSIS — Z23 Encounter for immunization: Secondary | ICD-10-CM

## 2017-01-23 DIAGNOSIS — B20 Human immunodeficiency virus [HIV] disease: Secondary | ICD-10-CM

## 2017-01-23 DIAGNOSIS — F329 Major depressive disorder, single episode, unspecified: Secondary | ICD-10-CM | POA: Diagnosis not present

## 2017-01-23 LAB — CBC WITH DIFFERENTIAL/PLATELET
BASOS PCT: 0 %
Basophils Absolute: 0 cells/uL (ref 0–200)
EOS ABS: 160 {cells}/uL (ref 15–500)
Eosinophils Relative: 5 %
HEMATOCRIT: 43.8 % (ref 35.0–45.0)
Hemoglobin: 14.9 g/dL (ref 11.7–15.5)
LYMPHS ABS: 1536 {cells}/uL (ref 850–3900)
LYMPHS PCT: 48 %
MCH: 31.2 pg (ref 27.0–33.0)
MCHC: 34 g/dL (ref 32.0–36.0)
MCV: 91.6 fL (ref 80.0–100.0)
MONO ABS: 352 {cells}/uL (ref 200–950)
MPV: 10.4 fL (ref 7.5–12.5)
Monocytes Relative: 11 %
NEUTROS ABS: 1152 {cells}/uL — AB (ref 1500–7800)
Neutrophils Relative %: 36 %
Platelets: 177 10*3/uL (ref 140–400)
RBC: 4.78 MIL/uL (ref 3.80–5.10)
RDW: 13.7 % (ref 11.0–15.0)
WBC: 3.2 10*3/uL — AB (ref 3.8–10.8)

## 2017-01-23 LAB — LIPID PANEL
Cholesterol: 192 mg/dL (ref <200)
HDL: 61 mg/dL (ref >50)
LDL CALC: 109 mg/dL — AB (ref <100)
TRIGLYCERIDES: 109 mg/dL (ref <150)
Total CHOL/HDL Ratio: 3.1 Ratio (ref <5.0)
VLDL: 22 mg/dL (ref <30)

## 2017-01-23 LAB — COMPLETE METABOLIC PANEL WITH GFR
ALT: 16 U/L (ref 6–29)
AST: 18 U/L (ref 10–35)
Albumin: 4 g/dL (ref 3.6–5.1)
Alkaline Phosphatase: 70 U/L (ref 33–130)
BILIRUBIN TOTAL: 1.3 mg/dL — AB (ref 0.2–1.2)
BUN: 12 mg/dL (ref 7–25)
CALCIUM: 9.2 mg/dL (ref 8.6–10.4)
CHLORIDE: 106 mmol/L (ref 98–110)
CO2: 28 mmol/L (ref 20–31)
CREATININE: 0.84 mg/dL (ref 0.50–1.05)
GFR, Est Non African American: 80 mL/min (ref >=60)
Glucose, Bld: 85 mg/dL (ref 65–99)
Potassium: 4.1 mmol/L (ref 3.5–5.3)
Sodium: 141 mmol/L (ref 135–146)
TOTAL PROTEIN: 7.1 g/dL (ref 6.1–8.1)

## 2017-01-23 NOTE — Progress Notes (Signed)
RFV: hiv disease  Patient ID: Abigail Medina, female   DOB: 08/28/1966, 51 y.o.   MRN: [id number]  HPI 51yo F with hiv disease, CD 4 count of 540/VL 28 )(feb 2017) on tivicay/descovy. Had difficulty with getting refills so she thinks she was out of meds for roughly 5 days. She is now back to taking her medications regularly.  Recovering from [place], she states that she is occasionally down. No thoughts of harm to self  Has occasional sciatica "attack" had to go to urgent care, received muscle relaxant  Outpatient Encounter Prescriptions as of 01/23/2017  Medication Sig  . aspirin 81 MG chewable tablet Chew 1 tablet (81 mg total) by mouth daily.  . cetirizine (ZYRTEC) 10 MG tablet Take 1 tablet (10 mg total) by mouth daily.  . cimetidine (TAGAMET) 200 MG tablet Take 1 tablet (200 mg total) by mouth 2 (two) times daily.  . cyclobenzaprine (FLEXERIL) 10 MG tablet Take 1 tablet (10 mg total) by mouth 2 (two) times daily as needed for muscle spasms.  . DESCOVY 200-25 MG tablet TAKE 1 TABLET BY MOUTH DAILY  . diclofenac (VOLTAREN) 75 MG EC tablet Take 1 tablet (75 mg total) by mouth 2 (two) times daily.  . ergocalciferol (VITAMIN D2) 50000 units capsule Take 1 capsule (50,000 Units total) by mouth once a week.  . flunisolide (NASALIDE) 25 MCG/ACT (0.025%) SOLN Place 2 sprays into the nose 2 (two) times daily.  Marland Kitchen gabapentin (NEURONTIN) 400 MG capsule Take 1 capsule (400 mg total) by mouth 3 (three) times daily.  . hydrochlorothiazide (HYDRODIURIL) 25 MG tablet Take 1 tablet (25 mg total) by mouth daily.  . hydrOXYzine (ATARAX/VISTARIL) 25 MG tablet Take 25 mg by mouth at bedtime.  . metoprolol succinate (TOPROL XL) 100 MG 24 hr tablet Take 1 tablet (100 mg total) by mouth daily. Take with or immediately following a meal.  . PARoxetine (PAXIL) 20 MG tablet Take 1 tablet (20 mg total) by mouth daily.  Marland Kitchen TIVICAY 50 MG tablet TAKE 1 TABLET(50 MG) BY MOUTH DAILY  . [DISCONTINUED] cyclobenzaprine  (FLEXERIL) 10 MG tablet Take 1 tablet (10 mg total) by mouth 3 (three) times daily as needed for muscle spasms.  . magic mouthwash w/lidocaine SOLN Take 5 mLs by mouth 3 (three) times daily as needed for mouth pain. (Patient not taking: Reported on 05/16/2016)  . topiramate (TOPAMAX) 50 MG tablet TAKE 1 TABLET BY MOUTH AT BEDTIME FOR 1 WEEK THEN INCREASE TO TWICE A DAY (Patient not taking: Reported on 01/23/2017)  . traZODone (DESYREL) 50 MG tablet Take 0.5 tablets (25 mg total) by mouth at bedtime as needed for sleep.  . [DISCONTINUED] emtricitabine-tenofovir AF (DESCOVY) 200-25 MG tablet Take 1 tablet by mouth daily.   No facility-administered encounter medications on file as of 01/23/2017.      Patient Active Problem List   Diagnosis Date Noted  . Other fatigue 05/16/2016  . Gait disturbance 06/22/2015  . 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 Tobacco dependence 06/02/2015  . Numbness and tingling of right arm 05/30/2015  . Right arm weakness 05/30/2015  . Arm numbness left 10/10/2014  . Swelling of upper lip 08/05/2014  . Vitamin D deficiency 08/05/2014  . Allergic rhinitis 05/25/2013  . Decreased leukocytes 05/25/2013  . 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 Allergy to drug 08/20/2012  . Cheek swelling 08/20/2012  . Polypharmacy 08/20/2012  . HIV (human immunodeficiency virus infection) ([place]) 08/20/2012  . Edema of face 07/15/2012  . HLD (hyperlipidemia) 07/15/2012  .  BP (high blood pressure) 07/15/2012  . Cold hands 01/30/2012  . Cerebrovascular accident, late effects 01/30/2012  . Chronic sinusitis 10/08/2011  . Sinusitis 10/08/2011  . Depression with anxiety 07/15/2011  . Insomnia 07/15/2011  . Folliculitis [id number]  . Seborrhea capitis 04/18/2011  . Seborrheic dermatitis 03/07/2011  . Hemiplegia, late effect of cerebrovascular disease ([place]) 01/25/2011  . VAGINAL DISCHARGE 12/26/2010  . NEVUS 08/29/2010  . AMENORRHEA 07/19/2010  . CELLULITIS AND ABSCESS OF UNSPECIFIED SITE  05/21/2010  . SKIN RASH 05/21/2010  . THRUSH 12/28/2009  . ACUTE BRONCHITIS 01/19/2009  . Human immunodeficiency virus (HIV) disease ([place]) 08/06/2007  . Essential hypertension 08/06/2007     Health Maintenance Due  Topic Date Due  . COLONOSCOPY  03/02/2014  . INFLUENZA VACCINE  07/16/2016     Review of Systems Besides what is mentioned in hpi. 10 point ros is negatvie Physical Exam   BP (!) 138/96   Pulse 67   Temp 98.2 F (36.8 C) (Oral)   Ht 5\' 3"  (1.6 m)   Wt 165 lb (74.8 kg)   LMP 08/04/2014   BMI 29.23 kg/m   Physical Exam  Constitutional:  oriented to person, place, and time. appears well-developed and well-nourished. No distress.  HENT: [place]/AT, PERRLA, no scleral icterus Mouth/Throat: Oropharynx is clear and moist. No oropharyngeal exudate.  Cardiovascular: Normal rate, regular rhythm and normal heart sounds. Exam reveals no gallop and no friction rub.  No murmur heard.  Pulmonary/Chest: Effort normal and breath sounds normal. No respiratory distress.  has no wheezes.  Neck = supple, no nuchal rigidity Abdominal: Soft. Bowel sounds are normal.  exhibits no distension. There is no tenderness.  Lymphadenopathy: no cervical adenopathy. No axillary adenopathy Neurological: alert and oriented to person, place, and time.  Skin: Skin is warm and dry. No rash noted. No erythema.  Psychiatric: a normal mood and affect.  behavior is normal.   Lab Results  Component Value Date   CD4TCELL 38 02/13/2016   Lab Results  Component Value Date   CD4TABS 540 02/13/2016   CD4TABS 1,250 06/14/2015   CD4TABS 410 06/02/2014   Lab Results  Component Value Date   HIV1RNAQUANT 28 (H) 02/13/2016   Lab Results  Component Value Date   HEPBSAB NEG 03/10/2014   Lab Results  Component Value Date   LABRPR NON REAC 02/13/2016    CBC Lab Results  Component Value Date   WBC 2.8 (L) 05/16/2016   RBC 4.92 05/16/2016   HGB 15.4 05/16/2016   HCT 45.6 (H) 05/16/2016   PLT 170  05/16/2016   MCV 92.7 05/16/2016   MCH 31.3 05/16/2016   MCHC 33.8 05/16/2016   RDW 13.7 05/16/2016   LYMPHSABS 1,484 05/16/2016   MONOABS 168 (L) 05/16/2016   EOSABS 112 05/16/2016    BMET Lab Results  Component Value Date   NA 141 05/16/2016   K 3.9 05/16/2016   CL 104 05/16/2016   CO2 25 05/16/2016   GLUCOSE 88 05/16/2016   BUN 11 05/16/2016   CREATININE 0.82 05/16/2016   CALCIUM 9.1 05/16/2016   GFRNONAA 83 05/16/2016   GFRAA >89 05/16/2016      Assessment and Plan   hiv disease =recheck lab, also get refills to ensure she is virally suppressed  Health maintenance = needs to get mammo. Will set up for pap smear. Will give flu shot  Depression = introduce to [facility] at this time does not want to change any meds  Sciatica = will give stretching  excercises

## 2017-01-24 LAB — T-HELPER CELL (CD4) - (RCID CLINIC ONLY)
CD4 % Helper T Cell: 31 % — ABNORMAL LOW (ref 33–55)
CD4 T CELL ABS: 520 /uL (ref 400–2700)

## 2017-01-24 LAB — RPR

## 2017-01-28 LAB — HIV-1 RNA QUANT-NO REFLEX-BLD
HIV 1 RNA QUANT: NOT DETECTED {copies}/mL
HIV-1 RNA Quant, Log: <1.3 Log copies/mL

## 2017-01-31 ENCOUNTER — Ambulatory Visit (INDEPENDENT_AMBULATORY_CARE_PROVIDER_SITE_OTHER): Payer: Medicaid Other | Admitting: Family Medicine

## 2017-01-31 ENCOUNTER — Encounter: Payer: Self-pay | Admitting: Family Medicine

## 2017-01-31 VITALS — BP 126/84 | HR 66 | Temp 97.7°F | Resp 18 | Wt 166.0 lb

## 2017-01-31 DIAGNOSIS — L659 Nonscarring hair loss, unspecified: Secondary | ICD-10-CM

## 2017-01-31 DIAGNOSIS — R29898 Other symptoms and signs involving the musculoskeletal system: Secondary | ICD-10-CM

## 2017-01-31 DIAGNOSIS — R200 Anesthesia of skin: Secondary | ICD-10-CM

## 2017-01-31 DIAGNOSIS — R296 Repeated falls: Secondary | ICD-10-CM

## 2017-01-31 DIAGNOSIS — B20 Human immunodeficiency virus [HIV] disease: Secondary | ICD-10-CM | POA: Diagnosis not present

## 2017-01-31 DIAGNOSIS — I10 Essential (primary) hypertension: Secondary | ICD-10-CM

## 2017-01-31 DIAGNOSIS — R202 Paresthesia of skin: Secondary | ICD-10-CM

## 2017-01-31 DIAGNOSIS — F339 Major depressive disorder, recurrent, unspecified: Secondary | ICD-10-CM

## 2017-01-31 DIAGNOSIS — Z1211 Encounter for screening for malignant neoplasm of colon: Secondary | ICD-10-CM

## 2017-01-31 LAB — POCT URINALYSIS DIP (DEVICE)
BILIRUBIN URINE: NEGATIVE
GLUCOSE, UA: NEGATIVE mg/dL
Hgb urine dipstick: NEGATIVE
KETONES UR: NEGATIVE mg/dL
LEUKOCYTES UA: NEGATIVE
Nitrite: NEGATIVE
Protein, ur: NEGATIVE mg/dL
SPECIFIC GRAVITY, URINE: 1.025 (ref 1.005–1.030)
Urobilinogen, UA: 1 mg/dL (ref 0.0–1.0)
pH: 6 (ref 5.0–8.0)

## 2017-01-31 LAB — TSH: TSH: 2.32 m[IU]/L

## 2017-01-31 NOTE — Progress Notes (Signed)
Subjective:    Patient ID: Abigail Medina, female    DOB: Aug 06, 1966, 51 y.o.   MRN: [id number]  HPI Ms. [name], a 51 year old female presents with a history of HIV, hypertension,  and a previous left CVA presents for a follow up of chronic conditions. Ms. 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 She has been referred to neurology on several occasions but has been lost to follow up. She is complaining of frequent falls. She says that her legs "give out with ambulation". She also complains of worsening neuropathy to upper and lower extremities. Patient has been taking gabapentin consistently without relief.  She reports that she is having difficulty lifting small items. She typically drops everything that she picks up.  She states that she was previously followed by neurologist post CVA, but has not been for follow up in several years. Pain is described as burning.  Patient denies headache, facial weakness, fever, dysuria, nausea, vomiting or diarrhea.   Ms. Bench also has a history of hypertension.  She is not exercising and is not adherent to low salt diet. Patient does not check blood pressures at home. a. Patient denies dizziness,  chest pain, dyspnea, irregular heart beat, near-syncope, palpitations and tachypnea.  Cardiovascular risk factors include: hypertension, sedentary lifestyle and smoking/ tobacco exposure. Past Medical History:  Diagnosis Date  . HIV (human immunodeficiency virus infection) ([place])   . Hypertension   . Seizures ([place])   . Stroke ([place])   . Vision abnormalities    Social History   Social History  . Marital status: Single    Spouse name: N/A  . Number of children: N/A  . Years of education: N/A   Occupational History  . Not on file.   Social History Main Topics  . Smoking status: Former Smoker    Packs/day: 0.25    Types: Cigarettes    Start date: 10/17/2015  . Smokeless tobacco: Never Used  . Alcohol use 1.2 - 1.8 oz/week    2 - 3  Standard drinks or equivalent per week     Comment: liquor or beer; stated has increased with social meetings  . Drug use: No  . Sexual activity: Yes    Partners: Male    Birth control/ protection: Condom     Comment: accepted condoms   Other Topics Concern  . Not on file   Social History Narrative  . No narrative on file   Immunization History  Administered Date(s) Administered  . Hepatitis B 01/19/2009, 04/18/2011, 12/03/2011  . Influenza Split 10/08/2011, 10/13/2012  . Influenza Whole 12/22/2008, 12/29/2009, 08/29/2010  . Influenza,inj,Quad PF,36+ Mos 11/30/2013, 10/10/2014, 10/03/2015, 01/23/2017  . Pneumococcal Polysaccharide-23 03/12/2007, 04/09/2012  . Tdap 08/07/2016   Allergies  Allergen Reactions  . Lisinopril Swelling    Review of Systems  Constitutional: Positive for fatigue.  Eyes: Negative.   Respiratory: Negative.   Cardiovascular: Negative.   Gastrointestinal: Negative for diarrhea and nausea.       Heartburn  Endocrine: Negative.   Genitourinary: Negative.   Musculoskeletal: Positive for back pain.  Allergic/Immunologic: Negative.   Neurological: Positive for weakness and numbness. Negative for dizziness.  Hematological: Negative.        Objective:   Physical Exam  Constitutional: She is oriented to person, place, and time. She appears well-developed and well-nourished.  HENT:  Head: Normocephalic and atraumatic.  Right Ear: External ear normal.  Left Ear: External ear normal.  Mouth/Throat: Oropharynx is clear  and moist.  Eyes: Conjunctivae and EOM are normal. Pupils are equal, round, and reactive to light.  Neck: Normal range of motion. Neck supple.  Cardiovascular: Normal rate, normal heart sounds and intact distal pulses.   Pulmonary/Chest: Effort normal and breath sounds normal. She has no wheezes.  Abdominal: Soft. Bowel sounds are normal. There is no tenderness.  Musculoskeletal: Normal range of motion.  Weakness to upper and lower  extremities  Neurological: She is alert and oriented to person, place, and time. She has normal reflexes. No cranial nerve deficit or sensory deficit. She exhibits abnormal muscle tone. Gait abnormal. Coordination normal.  Skin: Skin is warm and dry.  Psychiatric: She has a normal mood and affect. Her behavior is normal. Judgment and thought content normal.     BP 126/84 (BP Location: Left Arm, Patient Position: Sitting, Cuff Size: Normal)   Pulse 66   Temp 97.7 F (36.5 C)   Resp 18   Wt 166 lb (75.3 kg)   LMP 08/04/2014   SpO2 100%   BMI 29.41 kg/m  Assessment & Plan:  1. Human immunodeficiency virus (HIV) disease ([place]) She states that she is taking all medications consistently. Patient to follow up with Dr. Baxter Flattery as previously scheduled.   2. Right arm weakness Will defer to neurology for further workup and evaluation. There have been some compliance issues with follow up. I have discussed with her the great importance of following the treatment plan exactly as directed in order to achieve a good medical outcome. - Ambulatory referral to Neurology  3. Numbness and tingling of right arm - Ambulatory referral to Neurology  4. Falls frequently - Ambulatory referral to Neurology  5. Hair loss Refrain from wearing tight hair styles, braids, and weaves.  - TSH  6. Alopecia Refer to #5  7. Depression, recurrent ([place]) - Ambulatory referral to Psychiatry  8. Essential hypertension Blood pressure is at goal on current medication regimen  9. Colon cancer screening - Ambulatory referral to Gastroenterology  RTC: 3 months for chronic conditions Jacaden Forbush M, FNP      1. Neuropathy (HCC) - gabapentin (NEURONTIN) 400 MG capsule; Take 1 capsule (400 mg total) by mouth 3 (three) times daily.  Dispense: 90 capsule; Refill: 5 - Vitamin B12  2. Muscle spasm of back  - cyclobenzaprine (FLEXERIL) 10 MG tablet; Take 1 tablet (10 mg total) by mouth 3 (three) times daily as  needed for muscle spasms.  Dispense: 30 tablet; Refill: 1  3. Gastroesophageal reflux disease without esophagitis - cimetidine (TAGAMET) 200 MG tablet; Take 1 tablet (200 mg total) by mouth 2 (two) times daily.  Dispense: 30 tablet; Refill: 11  4. Essential hypertension Blood pressure is at goal on current medication regimen. The patient is asked to make an attempt to improve diet and exercise patterns to aid in medical management of this problem. - hydrochlorothiazide (HYDRODIURIL) 25 MG tablet; Take 1 tablet (25 mg total) by mouth daily.  Dispense: 30 tablet; Refill: 5 - COMPLETE METABOLIC PANEL WITH GFR  5. Vitamin D deficiency - Vitamin D, 25-hydroxy  6. Other fatigue  - CBC with Differential    RTC: 6 months for hypertension Dorena Dew, FNP

## 2017-01-31 NOTE — Patient Instructions (Addendum)
Hypertension Hypertension, commonly called high blood pressure, is when the force of blood pumping through your arteries is too strong. Your arteries are the blood vessels that carry blood from your heart throughout your body. A blood pressure reading consists of a higher number over a lower number, such as 110/72. The higher number (systolic) is the pressure inside your arteries when your heart pumps. The lower number (diastolic) is the pressure inside your arteries when your heart relaxes. Ideally you want your blood pressure below 120/80. Hypertension forces your heart to work harder to pump blood. Your arteries may become narrow or stiff. Having untreated or uncontrolled hypertension can cause heart attack, stroke, kidney disease, and other problems. What increases the risk? Some risk factors for high blood pressure are controllable. Others are not. Risk factors you cannot control include:  Race. You may be at higher risk if you are African American.  Age. Risk increases with age.  Gender. Men are at higher risk than women before age 45 years. After age 65, women are at higher risk than men. Risk factors you can control include:  Not getting enough exercise or physical activity.  Being overweight.  Getting too much fat, sugar, calories, or salt in your diet.  Drinking too much alcohol. What are the signs or symptoms? Hypertension does not usually cause signs or symptoms. Extremely high blood pressure (hypertensive crisis) may cause headache, anxiety, shortness of breath, and nosebleed. How is this diagnosed? To check if you have hypertension, your health care provider will measure your blood pressure while you are seated, with your arm held at the level of your heart. It should be measured at least twice using the same arm. Certain conditions can cause a difference in blood pressure between your right and left arms. A blood pressure reading that is higher than normal on one occasion does  not mean that you need treatment. If it is not clear whether you have high blood pressure, you may be asked to return on a different day to have your blood pressure checked again. Or, you may be asked to monitor your blood pressure at home for 1 or more weeks. How is this treated? Treating high blood pressure includes making lifestyle changes and possibly taking medicine. Living a healthy lifestyle can help lower high blood pressure. You may need to change some of your habits. Lifestyle changes may include:  Following the DASH diet. This diet is high in fruits, vegetables, and whole grains. It is low in salt, red meat, and added sugars.  Keep your sodium intake below 2,300 mg per day.  Getting at least 30-45 minutes of aerobic exercise at least 4 times per week.  Losing weight if necessary.  Not smoking.  Limiting alcoholic beverages.  Learning ways to reduce stress. Your health care provider may prescribe medicine if lifestyle changes are not enough to get your blood pressure under control, and if one of the following is true:  You are 18-59 years of age and your systolic blood pressure is above 140.  You are 60 years of age or older, and your systolic blood pressure is above 150.  Your diastolic blood pressure is above 90.  You have diabetes, and your systolic blood pressure is over 140 or your diastolic blood pressure is over 90.  You have kidney disease and your blood pressure is above 140/90.  You have heart disease and your blood pressure is above 140/90. Your personal target blood pressure may vary depending on your medical   conditions, your age, and other factors. Follow these instructions at home:  Have your blood pressure rechecked as directed by your health care provider.  Take medicines only as directed by your health care provider. Follow the directions carefully. Blood pressure medicines must be taken as prescribed. The medicine does not work as well when you skip  doses. Skipping doses also puts you at risk for problems.  Do not smoke.  Monitor your blood pressure at home as directed by your health care provider. Contact a health care provider if:  You think you are having a reaction to medicines taken.  You have recurrent headaches or feel dizzy.  You have swelling in your ankles.  You have trouble with your vision. Get help right away if:  You develop a severe headache or confusion.  You have unusual weakness, numbness, or feel faint.  You have severe chest or abdominal pain.  You vomit repeatedly.  You have trouble breathing. This information is not intended to replace advice given to you by your health care provider. Make sure you discuss any questions you have with your health care provider. Document Released: 12/02/2005 Document Revised: 05/09/2016 Document Reviewed: 09/24/2013 Elsevier Interactive Patient Education  2017 Elsevier Inc.  

## 2017-02-14 ENCOUNTER — Encounter: Payer: Self-pay | Admitting: Family Medicine

## 2017-03-23 ENCOUNTER — Encounter (HOSPITAL_COMMUNITY): Payer: Self-pay | Admitting: Emergency Medicine

## 2017-03-23 ENCOUNTER — Ambulatory Visit (HOSPITAL_COMMUNITY)
Admission: EM | Admit: 2017-03-23 | Discharge: 2017-03-23 | Disposition: A | Discharge status: Home / Self Care | Payer: Medicaid Other | Attending: Family Medicine | Admitting: Family Medicine

## 2017-03-23 DIAGNOSIS — J40 Bronchitis, not specified as acute or chronic: Secondary | ICD-10-CM

## 2017-03-23 MED ORDER — BENZONATATE 100 MG PO CAPS: 100.0000 mg | ORAL_CAPSULE | Freq: Three times a day (TID) | ORAL | 0 refills | Status: DC | PRN

## 2017-03-23 MED ORDER — ALBUTEROL SULFATE HFA 108 (90 BASE) MCG/ACT IN AERS: 2.0000 | INHALATION_SPRAY | RESPIRATORY_TRACT | 6 refills | Status: DC | PRN

## 2017-03-23 MED ORDER — AZITHROMYCIN 250 MG PO TABS: 250.0000 mg | ORAL_TABLET | Freq: Every day | ORAL | 0 refills | Status: DC

## 2017-03-23 NOTE — ED Provider Notes (Signed)
[place]    CSN: [id number] Arrival date & time: 03/23/17  1402     History   Chief Complaint Chief Complaint  Patient presents with  . Cough    HPI Abigail Medina is a 51 y.o. female.   The patient presented to the [facility] with a complaint of a cough with associated chest soreness x 5 days. She has a history of HIV disease, polypharmacy, hypertension, recurrent sinusitis and bronchitis, and polypharmacy with depression and anxiety.  Patient states that she is to smoke. She's quit now.  Patient states that 2 months ago she had bad cold which he treated herself. It seemed to get better but the cough persisted. Beginning about 5 days ago, cough got much worse with some chest tightness, wheezing, and occasional sharp pains in her chest.      Past Medical History:  Diagnosis Date  . HIV (human immunodeficiency virus infection) ([place])   . Hypertension   . Seizures ([place])   . Stroke ([place])   . Vision abnormalities     Patient Active Problem List   Diagnosis Date Noted  . Other fatigue 05/16/2016  . Gait disturbance 06/22/2015  . 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 Tobacco dependence 06/02/2015  . Numbness and tingling of right arm 05/30/2015  . Right arm weakness 05/30/2015  . Arm numbness left 10/10/2014  . Swelling of upper lip 08/05/2014  . Vitamin D deficiency 08/05/2014  . Allergic rhinitis 05/25/2013  . Decreased leukocytes 05/25/2013  . 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 Allergy to drug 08/20/2012  . Cheek swelling 08/20/2012  . Polypharmacy 08/20/2012  . HIV (human immunodeficiency virus infection) ([place]) 08/20/2012  . Edema of face 07/15/2012  . HLD (hyperlipidemia) 07/15/2012  . BP (high blood pressure) 07/15/2012  . Cold hands 01/30/2012  . Cerebrovascular accident, late effects 01/30/2012  . Chronic sinusitis 10/08/2011  . Sinusitis 10/08/2011  . Depression with anxiety 07/15/2011  . Insomnia 07/15/2011  . Folliculitis 35/57/3220  . Seborrhea  capitis 04/18/2011  . Seborrheic dermatitis 03/07/2011  . 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 VAGINAL DISCHARGE 12/26/2010  . NEVUS 08/29/2010  . AMENORRHEA 07/19/2010  . CELLULITIS AND ABSCESS OF UNSPECIFIED SITE 05/21/2010  . SKIN RASH 05/21/2010  . THRUSH 12/28/2009  . ACUTE BRONCHITIS 01/19/2009  . Human immunodeficiency virus (HIV) disease ([place]) 08/06/2007  . Essential hypertension 08/06/2007    History reviewed. No pertinent surgical history.  OB History    No data available       Home Medications    Prior to Admission medications   Medication Sig Start Date End Date Taking? Authorizing Provider  albuterol (PROVENTIL HFA;VENTOLIN HFA) 108 (90 Base) MCG/ACT inhaler Inhale 2 puffs into the lungs every 4 (four) hours as needed for wheezing or shortness of breath (cough, shortness of breath or wheezing.). 03/23/17   Robyn Haber, MD  azithromycin (ZITHROMAX) 250 MG tablet Take 1 tablet (250 mg total) by mouth daily. Take first 2 tablets together, then 1 every day until finished. 03/23/17   Robyn Haber, MD  benzonatate (TESSALON) 100 MG capsule Take 1-2 capsules (100-200 mg total) by mouth 3 (three) times daily as needed for cough. 03/23/17   Robyn Haber, MD  cetirizine (ZYRTEC) 10 MG tablet Take 1 tablet (10 mg total) by mouth daily. 05/16/16   Dorena Dew, FNP  cimetidine (TAGAMET) 200 MG tablet Take 1 tablet (200 mg total) by mouth 2 (two) times daily. 05/16/16   Dorena Dew, FNP  cyclobenzaprine (FLEXERIL) 10 MG tablet Take 1 tablet (10 mg total) by mouth 2 (two) times daily as needed for muscle spasms. 01/16/17   Barnet Glasgow, NP  DESCOVY 200-25 MG tablet TAKE 1 TABLET BY MOUTH DAILY 10/29/16   Carlyle Basques, MD  diclofenac (VOLTAREN) 75 MG EC tablet Take 1 tablet (75 mg total) by mouth 2 (two) times daily. 01/16/17   Barnet Glasgow, NP  ergocalciferol (VITAMIN D2) 50000 units capsule Take 1 capsule (50,000 Units total) by mouth  once a week. 05/17/16   Dorena Dew, FNP  flunisolide (NASALIDE) 25 MCG/ACT (0.025%) SOLN Place 2 sprays into the nose 2 (two) times daily. 10/03/15   Carlyle Basques, MD  gabapentin (NEURONTIN) 400 MG capsule Take 1 capsule (400 mg total) by mouth 3 (three) times daily. 05/16/16   Dorena Dew, FNP  hydrochlorothiazide (HYDRODIURIL) 25 MG tablet Take 1 tablet (25 mg total) by mouth daily. 05/16/16   Dorena Dew, FNP  hydrOXYzine (ATARAX/VISTARIL) 25 MG tablet Take 25 mg by mouth at bedtime.    Historical Provider, MD  metoprolol succinate (TOPROL XL) 100 MG 24 hr tablet Take 1 tablet (100 mg total) by mouth daily. Take with or immediately following a meal. 11/18/16   Carlyle Basques, MD  PARoxetine (PAXIL) 20 MG tablet Take 1 tablet (20 mg total) by mouth daily. 02/13/16   Carlyle Basques, MD  TIVICAY 50 MG tablet TAKE 1 TABLET(50 MG) BY MOUTH DAILY 12/02/16   Carlyle Basques, MD  traZODone (DESYREL) 50 MG tablet Take 0.5 tablets (25 mg total) by mouth at bedtime as needed for sleep. 02/13/16   Carlyle Basques, MD    Family History Family History  Problem Relation Age of Onset  . Hypertension Mother     Social History Social History  Substance Use Topics  . Smoking status: Former Smoker    Packs/day: 0.25    Types: Cigarettes    Start date: 10/17/2015  . Smokeless tobacco: Never Used  . Alcohol use 1.2 - 1.8 oz/week    2 - 3 Standard drinks or equivalent per week     Comment: liquor or beer; stated has increased with social meetings     Allergies   Lisinopril   Review of Systems Review of Systems  Constitutional: Negative.   HENT: Positive for congestion.   Respiratory: Positive for cough, chest tightness and wheezing.   Cardiovascular: Positive for chest pain.  Gastrointestinal: Negative.   Neurological: Negative.      Physical Exam Triage Vital Signs ED Triage Vitals  Enc Vitals Group     BP 03/23/17 1431 112/68     Pulse Rate 03/23/17 1431 77     Resp 03/23/17  1431 16     Temp 03/23/17 1431 99.2 F (37.3 C)     Temp Source 03/23/17 1431 Oral     SpO2 03/23/17 1431 97 %     Weight --      Height --      Head Circumference --      Peak Flow --      Pain Score 03/23/17 1430 3     Pain Loc --      Pain Edu? --      Excl. in [place]? --    No data found.   Updated Vital Signs BP 112/68 (BP Location: Right Arm)   Pulse 77   Temp 99.2 F (37.3 C) (Oral)   Resp 16   LMP 08/04/2014   SpO2 97%  Physical Exam  Constitutional: She is oriented to person, place, and time. She appears well-developed and well-nourished.  HENT:  Head: Normocephalic.  Right Ear: External ear normal.  Left Ear: External ear normal.  Mouth/Throat: Oropharynx is clear and moist.  Patient has an upper plate and multiple dental caries  Eyes: Conjunctivae are normal. Pupils are equal, round, and reactive to light.  Neck: Normal range of motion. Neck supple.  Cardiovascular: Normal rate, regular rhythm and normal heart sounds.   Pulmonary/Chest: Effort normal. She has wheezes. She has rales.  Abdominal: Soft.  Musculoskeletal: Normal range of motion.  Neurological: She is alert and oriented to person, place, and time.  Skin: Skin is warm and dry.  Nursing note and vitals reviewed.    UC Treatments / Results  Labs (all labs ordered are listed, but only abnormal results are displayed) Labs Reviewed - No data to display  EKG  EKG Interpretation None       Radiology No results found.  Procedures Procedures (including critical care time)  Medications Ordered in UC Medications - No data to display   Initial Impression / Assessment and Plan / UC Course  I have reviewed the triage vital signs and the nursing notes.  Pertinent labs & imaging results that were available during my care of the patient were reviewed by me and considered in my medical decision making (see chart for details).     Final Clinical Impressions(s) / UC Diagnoses   Final  diagnoses:  Bronchitis    New Prescriptions New Prescriptions   ALBUTEROL (PROVENTIL HFA;VENTOLIN HFA) 108 (90 BASE) MCG/ACT INHALER    Inhale 2 puffs into the lungs every 4 (four) hours as needed for wheezing or shortness of breath (cough, shortness of breath or wheezing.).   AZITHROMYCIN (ZITHROMAX) 250 MG TABLET    Take 1 tablet (250 mg total) by mouth daily. Take first 2 tablets together, then 1 every day until finished.   BENZONATATE (TESSALON) 100 MG CAPSULE    Take 1-2 capsules (100-200 mg total) by mouth 3 (three) times daily as needed for cough.     Robyn Haber, MD 03/23/17 [phone]

## 2017-03-23 NOTE — ED Triage Notes (Signed)
The patient presented to the [facility] with a complaint of a cough with associated chest soreness x 5 days.

## 2017-03-23 NOTE — Discharge Instructions (Signed)
Please return if you're not significantly improved in 2-3 days and your primary care doctor can get you an appointment.

## 2017-03-25 ENCOUNTER — Other Ambulatory Visit: Payer: Self-pay | Admitting: Internal Medicine

## 2017-03-25 DIAGNOSIS — Z1231 Encounter for screening mammogram for malignant neoplasm of breast: Secondary | ICD-10-CM

## 2017-04-03 ENCOUNTER — Other Ambulatory Visit: Payer: Self-pay | Admitting: Family Medicine

## 2017-04-03 DIAGNOSIS — G629 Polyneuropathy, unspecified: Secondary | ICD-10-CM

## 2017-04-11 ENCOUNTER — Ambulatory Visit (INDEPENDENT_AMBULATORY_CARE_PROVIDER_SITE_OTHER): Payer: Medicaid Other | Admitting: Neurology

## 2017-04-11 ENCOUNTER — Encounter: Payer: Self-pay | Admitting: Neurology

## 2017-04-11 VITALS — BP 132/90 | HR 55 | Temp 97.9°F | Resp 16 | Ht 63.5 in | Wt 164.0 lb

## 2017-04-11 DIAGNOSIS — R202 Paresthesia of skin: Secondary | ICD-10-CM

## 2017-04-11 DIAGNOSIS — R292 Abnormal reflex: Secondary | ICD-10-CM

## 2017-04-11 DIAGNOSIS — I69952 Hemiplegia and hemiparesis following unspecified cerebrovascular disease affecting left dominant side: Secondary | ICD-10-CM

## 2017-04-11 DIAGNOSIS — M542 Cervicalgia: Secondary | ICD-10-CM

## 2017-04-11 DIAGNOSIS — R200 Anesthesia of skin: Secondary | ICD-10-CM | POA: Diagnosis not present

## 2017-04-11 DIAGNOSIS — R296 Repeated falls: Secondary | ICD-10-CM

## 2017-04-11 DIAGNOSIS — R29898 Other symptoms and signs involving the musculoskeletal system: Secondary | ICD-10-CM | POA: Diagnosis not present

## 2017-04-11 NOTE — Patient Instructions (Signed)
1.  We will get a nerve study of both upper extremities to look for any pinched nerves in the arms. 2.  We will also check an MRI of the cervical spine to look for any pinching of the spinal cord 3.  Follow up afterwards

## 2017-04-11 NOTE — Progress Notes (Signed)
NEUROLOGY CONSULTATION NOTE  Abigail Medina MRN: [id number] DOB: [birth year]  Referring provider: Cammie Sickle, FNP Primary care provider: Cammie Sickle, FNP  Reason for consult:  Right arm weakness and falls  HISTORY OF PRESENT ILLNESS: Abigail Medina is a 51 year old right-handed female with HIV, hypertension and history of stroke who presents for right upper extremity weakness and frequent falls.  History supplemented by prior neurologist's notes.  MRIs of brain, cervical spine and MRA of head and neck mentioned below were personally reviewed.   She has residual left sided facial droop and left arm spasticity, weakness and numbness since her stroke in 2012.  MRI of brain on 01/25/11 demonstrated acute right frontal stroke and chronic right cerebellar stroke.  Since the stroke, she reported increased numbness and weakness over the face and left hand.  NCV-EMG at that time showed left ulnar neuropathy.  Several months after the stroke, she began reporting intermittent numbness in the right side of the face with intermittent right eye twitches.  MRI Of brain from 06/01/11 showed the chronic strokes but no changes.  MRI of cervical spine from 08/28/11 showed mild degenerative disc disease but no significant canal or neural foraminal stenosis to explain pain, numbness or weakness of the right upper extremity.  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  She did not have EMG at that time.  She continued to have worsening right upper extremity symptoms, as well as gait difficulty since at least 2016.  She followed up with a neurologist in 2016 for further evaluation.  MRI of brain from 07/06/15 showed no changes in cerebrovascular disease, but demonstrated an incidental small venous angioma in the left parieto-occipital region with small area of remote hemorrhage.  MRA of head from 07/21/15 showed minimal stenosis in the right posterior cerebral artery but  otherwise unremarkable.  Carotid doppler from 07/13/15 showed no hemodynamically significant ICA stenosis.  MRI of cervical spine again showed degenerative disc disease with mild left foraminal narrowing at C4-5 and C5-6 but no nerve root compression.  Repeat NCV-EMG of both upper extremities from 07/11/15 demonstrated normal NCV but showed "polyphasic motor units with mildly neuropathic recruitment" of the flexor carpi ulnaris and first dorsal interosseous muscles, suggestive of ulnar neuropathy at the elbow.  She also has chronic low back pain with history of radicular pain down the left leg.  Lumbar X-ray from 10/10/14 showed mild osteoarthritic changes at L4-5 and L5-S1 bilaterally.  Over the past year, she reports increased pain and weakness in the right upper extremity.  It started out with pain in the middle finger and index finger.  Sometimes her right middle finger gets stuck and she cannot bend it.  She also reports numbness in both hands up the forearms when she is leaning on her elbows or laying in bed.  She has to lay her arms straight down at her sides.  She still reports balance problems and falls.  She has neck pain.  01/23/17:  CD 4 count 520, viral load not detected; RPR non reactive; CBC with WBC 3.2, HGB 14.9, HCT 43.8, PLT 177; CMP with Na 141, 4.1, Cl 106, CO2 28, glucose 85, BUN 12, Cr 0.84, total bili 1.3, ALP 70, AST 18, ALT 16.  PAST MEDICAL HISTORY: Past Medical History:  Diagnosis Date  . HIV (human immunodeficiency virus infection) ([place])   . Hypertension   . Seizures ([place])   . Stroke ([place])   . Vision abnormalities  PAST SURGICAL HISTORY: No past surgical history on file.  MEDICATIONS: Current Outpatient Prescriptions on File Prior to Visit  Medication Sig Dispense Refill  . albuterol (PROVENTIL HFA;VENTOLIN HFA) 108 (90 Base) MCG/ACT inhaler Inhale 2 puffs into the lungs every 4 (four) hours as needed for wheezing or shortness of breath (cough, shortness of breath or  wheezing.). 1 Inhaler 6  . benzonatate (TESSALON) 100 MG capsule Take 1-2 capsules (100-200 mg total) by mouth 3 (three) times daily as needed for cough. 40 capsule 0  . cetirizine (ZYRTEC) 10 MG tablet Take 1 tablet (10 mg total) by mouth daily. 30 tablet 11  . cimetidine (TAGAMET) 200 MG tablet Take 1 tablet (200 mg total) by mouth 2 (two) times daily. 30 tablet 11  . cyclobenzaprine (FLEXERIL) 10 MG tablet Take 1 tablet (10 mg total) by mouth 2 (two) times daily as needed for muscle spasms. 20 tablet 0  . DESCOVY 200-25 MG tablet TAKE 1 TABLET BY MOUTH DAILY 30 tablet 5  . diclofenac (VOLTAREN) 75 MG EC tablet Take 1 tablet (75 mg total) by mouth 2 (two) times daily. 20 tablet 0  . ergocalciferol (VITAMIN D2) 50000 units capsule Take 1 capsule (50,000 Units total) by mouth once a week. 4 capsule 12  . flunisolide (NASALIDE) 25 MCG/ACT (0.025%) SOLN Place 2 sprays into the nose 2 (two) times daily. 1 Bottle 3  . gabapentin (NEURONTIN) 400 MG capsule TAKE ONE CAPSULE BY MOUTH 3 TIMES A DAY 90 capsule 5  . hydrochlorothiazide (HYDRODIURIL) 25 MG tablet Take 1 tablet (25 mg total) by mouth daily. 30 tablet 5  . hydrOXYzine (ATARAX/VISTARIL) 25 MG tablet Take 25 mg by mouth at bedtime.    . metoprolol succinate (TOPROL XL) 100 MG 24 hr tablet Take 1 tablet (100 mg total) by mouth daily. Take with or immediately following a meal. 30 tablet 5  . PARoxetine (PAXIL) 20 MG tablet Take 1 tablet (20 mg total) by mouth daily. 30 tablet 11  . TIVICAY 50 MG tablet TAKE 1 TABLET(50 MG) BY MOUTH DAILY 30 tablet 5  . traZODone (DESYREL) 50 MG tablet Take 0.5 tablets (25 mg total) by mouth at bedtime as needed for sleep. 30 tablet 2  . [DISCONTINUED] lisinopril (PRINIVIL,ZESTRIL) 10 MG tablet TAKE 1 TABLET BY MOUTH EVERY DAY 30 tablet 2   No current facility-administered medications on file prior to visit.     ALLERGIES: Allergies  Allergen Reactions  . Lisinopril Swelling    FAMILY HISTORY: Family  History  Problem Relation Age of Onset  . Hypertension Mother     SOCIAL HISTORY: Social History   Social History  . Marital status: Single    Spouse name: N/A  . Number of children: N/A  . Years of education: N/A   Occupational History  . Not on file.   Social History Main Topics  . Smoking status: Former Smoker    Packs/day: 0.25    Types: Cigarettes    Start date: 10/17/2015  . Smokeless tobacco: Never Used  . Alcohol use 1.2 - 1.8 oz/week    2 - 3 Standard drinks or equivalent per week     Comment: liquor or beer; stated has increased with social meetings  . Drug use: No  . Sexual activity: Yes    Partners: Male    Birth control/ protection: Condom     Comment: accepted condoms   Other Topics Concern  . Not on file   Social History Narrative  . No  narrative on file    REVIEW OF SYSTEMS: Constitutional: No fevers, chills, or sweats, no generalized fatigue, change in appetite Eyes: No visual changes, double vision, eye pain Ear, nose and throat: No hearing loss, ear pain, nasal congestion, sore throat Cardiovascular: No chest pain, palpitations Respiratory:  No shortness of breath at rest or with exertion, wheezes GastrointestinaI: No nausea, vomiting, diarrhea, abdominal pain, fecal incontinence Genitourinary:  No dysuria, urinary retention or frequency Musculoskeletal:  No neck pain, back pain Integumentary: No rash, pruritus, skin lesions Neurological: as above Psychiatric: No depression, insomnia, anxiety Endocrine: No palpitations, fatigue, diaphoresis, mood swings, change in appetite, change in weight, increased thirst Hematologic/Lymphatic:  No purpura, petechiae. Allergic/Immunologic: no itchy/runny eyes, nasal congestion, recent allergic reactions, rashes  PHYSICAL EXAM: Vitals:   04/11/17 1509  BP: 132/90  Pulse: (!) 55  Resp: 16  Temp: 97.9 F (36.6 C)   General: No acute distress.  Patient appears well-groomed.  Head:   Normocephalic/atraumatic Eyes:  fundi examined but not visualized Neck: supple, no paraspinal tenderness, full range of motion Back: No paraspinal tenderness Heart: regular rate and rhythm Lungs: Clear to auscultation bilaterally. Vascular: No carotid bruits. Neurological Exam: Mental status: alert and oriented to person, place, and time, recent and remote memory intact, fund of knowledge intact, attention and concentration intact, speech fluent and not dysarthric, language intact. Cranial nerves: CN I: not tested CN II: pupils equal, round and reactive to light, visual fields intact CN III, IV, VI:  full range of motion, no nystagmus, no ptosis CN V: decreased left V2-V3 CN VII: left lower facial weakness CN VIII: hearing intact CN IX, X: gag intact, uvula midline CN XI: sternocleidomastoid and trapezius muscles intact CN XII: tongue midline Bulk & Tone:increased tone in the left upper extremity, no fasciculations. Motor:  5-/5 left upper extremity.  Cannot completely flex right middle finger when she makes a fist.  She exhibits some giveaway weakness in all extremities as well. Sensation:  Pinprick and vibration sensation reduced in left upper and lower extremities. Deep Tendon Reflexes:  3+ throughout, toes downgoing. Finger to nose testing:  Without dysmetria.  Heel to shin:  Without dysmetria.  Gait:  Wide-based gait, left spastic.  Able to turn but unable to tandem walk. Romberg negative. Positive Tinel's signs at both elbows and wrists.  IMPRESSION: 1.  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  Bilateral upper extremity numbness, likely peripheral secondary to ulnar neuropathy and carpal tunnel 3.  Trigger finger 4.  Symmetric hyperreflexia.  May be of no clinical significance, but she is hyperreflexic on the right side as well as the left side that affected her stroke.  This was not noted on prior neurology note.  Given continued falls and upper extremity symptoms,  would like to rule out a cervical cord lesion such as due to stenosis.  PLAN: 1.  Will check NCV-EMG of upper extremities 2.  Will check MRI of cervical spine 3.  Follow up after testing.  Thank you for allowing me to take part in the care of this patient.  Metta Clines, DO  CC: Cammie Sickle, FNP

## 2017-04-13 ENCOUNTER — Other Ambulatory Visit: Payer: Self-pay | Admitting: Family Medicine

## 2017-04-13 DIAGNOSIS — I10 Essential (primary) hypertension: Secondary | ICD-10-CM

## 2017-04-14 ENCOUNTER — Ambulatory Visit
Admission: RE | Admit: 2017-04-14 | Discharge: 2017-04-14 | Disposition: A | Discharge status: Home / Self Care | Payer: Medicaid Other | Source: Ambulatory Visit | Attending: Internal Medicine | Admitting: Internal Medicine

## 2017-04-14 DIAGNOSIS — Z1231 Encounter for screening mammogram for malignant neoplasm of breast: Secondary | ICD-10-CM

## 2017-04-17 ENCOUNTER — Ambulatory Visit (INDEPENDENT_AMBULATORY_CARE_PROVIDER_SITE_OTHER): Payer: Medicaid Other | Admitting: Neurology

## 2017-04-17 ENCOUNTER — Other Ambulatory Visit: Payer: Self-pay | Admitting: *Deleted

## 2017-04-17 DIAGNOSIS — R200 Anesthesia of skin: Secondary | ICD-10-CM

## 2017-04-17 DIAGNOSIS — G5601 Carpal tunnel syndrome, right upper limb: Secondary | ICD-10-CM

## 2017-04-17 DIAGNOSIS — G5621 Lesion of ulnar nerve, right upper limb: Secondary | ICD-10-CM

## 2017-04-17 NOTE — Procedures (Signed)
[facility]  [place], [place]  [place], [place] [address] Tel: [phone] Fax:  [phone] Test Date:  04/17/2017  Patient: Abigail Medina DOB: 1966-11-29 Physician: Narda Amber, DO  Sex: Female Height: 5\' 3"  Ref Phys: Metta Clines, D.O.  ID#: [id number] Temp: 35.1C Technician:    Patient Complaints: This is a 51 year-old patient with HIV and left hemiparesis from stroke referred for evaluation of bilateral arm paresthesias.  NCV & EMG Findings: Extensive electrodiagnostic testing of the right upper extremity and additional studies of the left shows:  1. Bilateral median, ulnar, and left mixed palmer sensory responses are within normal limits. Right mixed palmer sensory response shows prolonged latency.  2. Right ulnar motor response shows decreased conduction velocity (A Elbow-B Elbow, 36 m/s) across the elbow, with normal latency and amplitude. Bilateral median and left ulnar motor responses are within normal limits. 3. There is no evidence of active or chronic motor axon loss changes affecting any of the tested muscles.  In the left upper extremity, there is a global pattern of incomplete motor unit recruitment as seen by variable firing pattern, and most likely due to central disorder of motor unit control given the patient's history of stroke affecting left side.  Additionally, despite maximal activation, no motor units were recruited in the left first dorsal interosseous muscle.  Impression: 1. Right median neuropathy at or distal to the wrist, consistent with the clinical diagnosis of carpal tunnel syndrome. Overall, these findings are very mild in degree electrically. 2. Right ulnar neuropathy with slowing across the elbow, purely demyelinating in type.  3. There is no evidence of nerve entrapment or cervical radiculopathy affecting the left upper extremity. Of note, there is a global pattern of incomplete motor unit activation in the left upper extremity most  likely due to central disorder of motor unit control.    ___________________________ Narda Amber, DO    Nerve Conduction Studies Anti Sensory Summary Table   Site NR Peak (ms) Norm Peak (ms) P-T Amp (V) Norm P-T Amp  Left Median Anti Sensory (2nd Digit)  35.1C  Wrist    2.8 <3.6 57.6 >15  Right Median Anti Sensory (2nd Digit)  35.1C  Wrist    2.9 <3.6 27.2 >15  Left Ulnar Anti Sensory (5th Digit)  35.1C  Wrist    2.8 <3.1 28.1 >10  Right Ulnar Anti Sensory (5th Digit)  35.1C  Wrist    2.6 <3.1 27.1 >10   Motor Summary Table   Site NR Onset (ms) Norm Onset (ms) O-P Amp (mV) Norm O-P Amp Site1 Site2 Delta-0 (ms) Dist (cm) Vel (m/s) Norm Vel (m/s)  Left Median Motor (Abd Poll Brev)  35.1C  Wrist    3.3 <4.0 12.2 >6 Elbow Wrist 4.1 26.0 63 >50  Elbow    7.4  11.0         Right Median Motor (Abd Poll Brev)  35.1C  Wrist    3.0 <4.0 14.1 >6 Elbow Wrist 4.6 26.0 57 >50  Elbow    7.6  13.2         Left Ulnar Motor (Abd Dig Minimi)  35.1C  Wrist    2.4 <3.1 9.1 >7 B Elbow Wrist 4.0 23.0 58 >50  B Elbow    6.4  8.0  A Elbow B Elbow 1.3 10.0 77 >50  A Elbow    7.7  6.3         Right Ulnar Motor (Abd Dig Minimi)  35.1C  Wrist    2.3 <3.1 10.4 >7 B Elbow Wrist 2.9 23.0 79 >50  B Elbow    5.2  9.1  A Elbow B Elbow 2.8 10.0 36 >50  A Elbow    8.0  8.8          Comparison Summary Table   Site NR Peak (ms) Norm Peak (ms) P-T Amp (V) Site1 Site2 Delta-P (ms) Norm Delta (ms)  Left Median/Ulnar Palm Comparison (Wrist - 8cm)  35.1C  Median Palm    1.6 <2.2 77.7 Median Palm Ulnar Palm 0.0   Ulnar Palm    1.6 <2.2 18.3      Right Median/Ulnar Palm Comparison (Wrist - 8cm)  35.1C  Median Palm    2.0 <2.2 34.3 Median Palm Ulnar Palm 0.5   Ulnar Palm    1.5 <2.2 13.5       EMG   Side Muscle Ins Act Fibs Psw Fasc Number Recrt Dur Dur. Amp Amp. Poly Poly. Comment  Right 1stDorInt Nml Nml Nml Nml Nml Nml Nml Nml Nml Nml Nml Nml N/A  Right Abd Poll Brev Nml Nml Nml Nml Nml Nml  Nml Nml Nml Nml Nml Nml N/A  Right Ext Indicis Nml Nml Nml Nml Nml Nml Nml Nml Nml Nml Nml Nml N/A  Right PronatorTeres Nml Nml Nml Nml Nml Nml Nml Nml Nml Nml Nml Nml N/A  Right Biceps Nml Nml Nml Nml Nml Nml Nml Nml Nml Nml Nml Nml N/A  Right Triceps Nml Nml Nml Nml Nml Nml Nml Nml Nml Nml Nml Nml N/A  Right Deltoid Nml Nml Nml Nml Nml Nml Nml Nml Nml Nml Nml Nml N/A  Right ABD Dig Min Nml Nml Nml Nml Nml Nml Nml Nml Nml Nml Nml Nml N/A  Right FlexCarpiUln Nml Nml Nml Nml Nml Nml Nml Nml Nml Nml Nml Nml N/A  Left 1stDorInt Nml Nml Nml Nml None None - - - - - - N/A  Left Ext Indicis Nml Nml Nml Nml 1- Mod-V Nml Nml Nml Nml Nml Nml N/A  Left PronatorTeres Nml Nml Nml Nml 1- Mod-V Nml Nml Nml Nml Nml Nml N/A  Left Biceps Nml Nml Nml Nml 1- Mod-V Nml Nml Nml Nml Nml Nml N/A  Left Triceps Nml Nml Nml Nml 1- Mod-V Nml Nml Nml Nml Nml Nml N/A  Left Deltoid Nml Nml Nml Nml 1- Mod-V Nml Nml Nml Nml Nml Nml N/A      Waveforms:

## 2017-04-18 ENCOUNTER — Telehealth: Payer: Self-pay

## 2017-04-18 NOTE — Telephone Encounter (Signed)
-----   Message from Pieter Partridge, DO sent at 04/18/2017  7:12 AM EDT ----- Nerve study shows evidence of mild carpal tunnel syndrome and pinched nerve on the elbow in the right arm.  Either one may be contributing to the numbness and tingling in the right hand.  Left arm was normal, but there still may be a mild underlying carpal tunnel syndrome.  I recommend wearing wrist splints in both wrists at night to see if symptoms improve (they can be bought at any pharmacy. Alternatively, we can provide her a prescription and she can bring it to [facility]).  Also, I want her to take care not to lean on her elbows.  In the meantime, we are still waiting on the MRI of cervical spine.  When she follows up, she can let me know how she is doing.

## 2017-04-18 NOTE — Telephone Encounter (Signed)
Informed patient of nerve study results and recommendations.

## 2017-04-28 ENCOUNTER — Other Ambulatory Visit: Payer: Self-pay | Admitting: Neurology

## 2017-04-28 ENCOUNTER — Telehealth: Payer: Self-pay | Admitting: Neurology

## 2017-04-28 NOTE — Telephone Encounter (Signed)
Noelle made aware authorization received.

## 2017-04-28 NOTE — Telephone Encounter (Signed)
Caller: Abigail Medina with [facility]  Urgent? No  Reason for the call: She said PT has medicaid and needs an authorization for her MRI/CB#[phone] [id number]

## 2017-04-29 ENCOUNTER — Telehealth: Payer: Self-pay | Admitting: Neurology

## 2017-04-29 NOTE — Telephone Encounter (Signed)
LMOM making Noelle aware Josem Kaufmann is in EPIC. Left auth number on voicemail.

## 2017-04-29 NOTE — Telephone Encounter (Signed)
noelle from [facility] called  [phone] ext [phone] patient needs a prior auth  By [facility]

## 2017-04-30 ENCOUNTER — Telehealth: Payer: Self-pay

## 2017-04-30 ENCOUNTER — Ambulatory Visit (INDEPENDENT_AMBULATORY_CARE_PROVIDER_SITE_OTHER): Payer: Medicaid Other | Admitting: Family Medicine

## 2017-04-30 ENCOUNTER — Telehealth: Payer: Self-pay | Admitting: Neurology

## 2017-04-30 ENCOUNTER — Encounter: Payer: Self-pay | Admitting: Family Medicine

## 2017-04-30 ENCOUNTER — Other Ambulatory Visit: Payer: Self-pay | Admitting: *Deleted

## 2017-04-30 VITALS — BP 134/92 | HR 70 | Temp 97.8°F | Resp 16 | Ht 63.0 in | Wt 167.0 lb

## 2017-04-30 DIAGNOSIS — F411 Generalized anxiety disorder: Secondary | ICD-10-CM

## 2017-04-30 DIAGNOSIS — I10 Essential (primary) hypertension: Secondary | ICD-10-CM

## 2017-04-30 DIAGNOSIS — F17200 Nicotine dependence, unspecified, uncomplicated: Secondary | ICD-10-CM

## 2017-04-30 DIAGNOSIS — E785 Hyperlipidemia, unspecified: Secondary | ICD-10-CM

## 2017-04-30 DIAGNOSIS — M21619 Bunion of unspecified foot: Secondary | ICD-10-CM | POA: Diagnosis not present

## 2017-04-30 MED ORDER — PAROXETINE HCL 30 MG PO TABS: 30.0000 mg | ORAL_TABLET | Freq: Every day | ORAL | 5 refills | Status: DC

## 2017-04-30 MED ORDER — DIAZEPAM 5 MG PO TABS: 5.0000 mg | ORAL_TABLET | Freq: Once | ORAL | 0 refills | Status: AC

## 2017-04-30 NOTE — Telephone Encounter (Signed)
Called and left message. Patient left without getting blood drawn. Needs to come in for lab draw only. Thanks!

## 2017-04-30 NOTE — Telephone Encounter (Signed)
Rx faxed

## 2017-04-30 NOTE — Telephone Encounter (Signed)
Caller: Dayana  Urgent? Yes  Reason for the call: She is having an MRI scheduled for tomorrow needs something to help her relax

## 2017-04-30 NOTE — Progress Notes (Signed)
Subjective:    Patient ID: Ferd Glassing, female    DOB: November 07, 1966, 51 y.o.   MRN: [id number]  HPI Ms. Leeza Heiner, a 51 year old female presents with a history of HIV, hypertension,  and a previous left CVA presents for a follow up of chronic conditions. Ms. Seder had a CVA 5 years ago with residual left side weakness. She is currently followed by neurology. Ms. Einspahr also has a history of hypertension.  She is not exercising and is not adherent to low salt diet. Patient does not check blood pressures at home. a. Patient denies dizziness,  chest pain, dyspnea, irregular heart beat, near-syncope, palpitations and tachypnea.  Cardiovascular risk factors include: hypertension, sedentary lifestyle and smoking/ tobacco exposure. Patient complains of anxiety disorder.  She has the following symptoms: difficulty concentrating, feelings of losing control, irritable, racing thoughts. Onset of symptoms was approximately several years ago. She denies current suicidal and homicidal ideation. She is currently taking paroxetine without sustained relief of anxiety symptoms. She was recently referred to psychiatry, but was unable to get to the appointment. She has not attempted to re-schedule appt.    Past Medical History:  Diagnosis Date  . HIV (human immunodeficiency virus infection) ([place])   . Hypertension   . Seizures ([place])   . Stroke ([place])   . Vision abnormalities    Social History   Social History  . Marital status: Single    Spouse name: N/A  . Number of children: N/A  . Years of education: N/A   Occupational History  . Not on file.   Social History Main Topics  . Smoking status: Former Smoker    Packs/day: 0.25    Types: Cigarettes    Start date: 10/17/2015  . Smokeless tobacco: Never Used  . Alcohol use 1.2 - 1.8 oz/week    2 - 3 Standard drinks or equivalent per week     Comment: liquor or beer; stated has increased with social meetings  . Drug use: No  . Sexual activity: Yes   Partners: Male    Birth control/ protection: Condom     Comment: accepted condoms   Other Topics Concern  . Not on file   Social History Narrative  . No narrative on file   Immunization History  Administered Date(s) Administered  . Hepatitis B 01/19/2009, 04/18/2011, 12/03/2011  . Influenza Split 10/08/2011, 10/13/2012  . Influenza Whole 12/22/2008, 12/29/2009, 08/29/2010  . Influenza,inj,Quad PF,36+ Mos 11/30/2013, 10/10/2014, 10/03/2015, 01/23/2017  . Pneumococcal Polysaccharide-23 03/12/2007, 04/09/2012  . Tdap 08/07/2016   Allergies  Allergen Reactions  . Lisinopril Swelling    Review of Systems  Constitutional: Positive for fatigue.  Eyes: Negative.   Respiratory: Negative.   Cardiovascular: Negative.   Gastrointestinal: Negative for diarrhea and nausea.       Heartburn  Endocrine: Negative.   Genitourinary: Negative.   Musculoskeletal: Positive for back pain.  Allergic/Immunologic: Negative.   Neurological: Positive for weakness and numbness. Negative for dizziness.  Hematological: Negative.        Objective:   Physical Exam  Constitutional: She is oriented to person, place, and time. She appears well-developed and well-nourished.  HENT:  Head: Normocephalic and atraumatic.  Right Ear: External ear normal.  Left Ear: External ear normal.  Mouth/Throat: Oropharynx is clear and moist.  Eyes: Conjunctivae and EOM are normal. Pupils are equal, round, and reactive to light.  Neck: Normal range of motion. Neck supple.  Cardiovascular: Normal rate, normal heart sounds and intact distal pulses.  Pulmonary/Chest: Effort normal and breath sounds normal. She has no wheezes.  Abdominal: Soft. Bowel sounds are normal. There is no tenderness.  Musculoskeletal: Normal range of motion.  Weakness to upper and lower extremities  Neurological: She is alert and oriented to person, place, and time. She has normal reflexes. No cranial nerve deficit or sensory deficit. She  exhibits abnormal muscle tone. Gait abnormal. Coordination normal.  Skin: Skin is warm and dry.  Bunion to lateral aspect of feet.   Psychiatric: She has a normal mood and affect. Her behavior is normal. Judgment and thought content normal.     BP (!) 134/92 (BP Location: Left Arm, Patient Position: Sitting, Cuff Size: Normal) Comment: manual  Pulse 70   Temp 97.8 F (36.6 C) (Oral)   Resp 16   Ht 5\' 3"  (1.6 m)   Wt 167 lb (75.8 kg)   LMP 08/04/2014   SpO2 100%   BMI 29.58 kg/m  Assessment & Plan:  1. Essential hypertension Blood pressure is at goal on current medication regimen. She has not been taking Lisinopril, which has been listed as an allergy due to face swelling.  - BASIC METABOLIC PANEL WITH GFR - POCT urinalysis dip (device)  2. Hyperlipidemia, unspecified hyperlipidemia type Reviewed previous lipid panel, mild elevation of LDL.  Discussed the importance of a low fat, plant based diet at length.  The patient is asked to make an attempt to improve diet and exercise patterns to aid in medical management of this problem.   3. Generalized anxiety disorder Continue Paxil, will increase to 30 mg daily.  GAD 7 : Generalized Anxiety Score 04/30/2017  Nervous, Anxious, on Edge 3  Control/stop worrying 3  Worry too much - different things 3  Trouble relaxing 2  Restless 2  Easily annoyed or irritable 3  Afraid - awful might happen 1  Total GAD 7 Score 17  Anxiety Difficulty Not difficult at all   Previously referred to psychiatry, she missed scheduled appointment. Will call to reschedule.  4. Tobacco dependence Smoking cessation instruction/counseling given:  counseled patient on the dangers of tobacco use, advised patient to stop smoking, and reviewed strategies to maximize success 5. Bunion of unspecified foot - Ambulatory referral to Podiatry   RTC: 3 months for chronic conditions   [place]  MSN, FNP-C [place] [address]  [place], [place] [address] [phone]

## 2017-04-30 NOTE — Patient Instructions (Addendum)
Generalized anxiety:  Increased Paxil 30 mg daily You can use [facility] in [facility] for any acute needs [place], [place]  M-F  8am- 3pm  Hypertension:  Blood pressure is at goal on medication regimen The patient is asked to make an attempt to improve diet and exercise patterns to aid in medical management of this problem. Recommend a lowfat, low carbohydrate diet divided over 5-6 small meals, increase water intake to 6-8 glasses, and 150 minutes per week of cardiovascular exercise.

## 2017-04-30 NOTE — Telephone Encounter (Signed)
Medication has been sent to [facility] on [address]

## 2017-05-01 ENCOUNTER — Ambulatory Visit (INDEPENDENT_AMBULATORY_CARE_PROVIDER_SITE_OTHER): Payer: Medicaid Other | Admitting: Internal Medicine

## 2017-05-01 ENCOUNTER — Ambulatory Visit: Payer: Self-pay | Admitting: Licensed Clinical Social Worker

## 2017-05-01 ENCOUNTER — Encounter: Payer: Self-pay | Admitting: Internal Medicine

## 2017-05-01 ENCOUNTER — Ambulatory Visit
Admission: RE | Admit: 2017-05-01 | Discharge: 2017-05-01 | Disposition: A | Discharge status: Home / Self Care | Payer: Medicaid Other | Source: Ambulatory Visit | Attending: Neurology | Admitting: Neurology

## 2017-05-01 VITALS — BP 102/72 | HR 64 | Temp 97.9°F | Ht 63.0 in | Wt 166.0 lb

## 2017-05-01 DIAGNOSIS — R200 Anesthesia of skin: Secondary | ICD-10-CM

## 2017-05-01 DIAGNOSIS — F10220 Alcohol dependence with intoxication, uncomplicated: Secondary | ICD-10-CM

## 2017-05-01 DIAGNOSIS — I10 Essential (primary) hypertension: Secondary | ICD-10-CM | POA: Diagnosis not present

## 2017-05-01 DIAGNOSIS — F321 Major depressive disorder, single episode, moderate: Secondary | ICD-10-CM

## 2017-05-01 DIAGNOSIS — B20 Human immunodeficiency virus [HIV] disease: Secondary | ICD-10-CM

## 2017-05-01 DIAGNOSIS — R202 Paresthesia of skin: Secondary | ICD-10-CM

## 2017-05-01 LAB — POCT URINALYSIS DIP (DEVICE)
Bilirubin Urine: NEGATIVE
GLUCOSE, UA: NEGATIVE mg/dL
Hgb urine dipstick: NEGATIVE
KETONES UR: NEGATIVE mg/dL
LEUKOCYTES UA: NEGATIVE
Nitrite: NEGATIVE
Protein, ur: NEGATIVE mg/dL
SPECIFIC GRAVITY, URINE: 1.025 (ref 1.005–1.030)
Urobilinogen, UA: 1 mg/dL (ref 0.0–1.0)
pH: 5.5 (ref 5.0–8.0)

## 2017-05-01 MED ORDER — NALTREXONE HCL 50 MG PO TABS: 50.0000 mg | ORAL_TABLET | Freq: Every day | ORAL | 3 refills | Status: DC

## 2017-05-01 NOTE — Progress Notes (Signed)
RFV: follow up for hiv disease  Patient ID: Abigail Medina, female   DOB: 15-Oct-1966, 51 y.o.   MRN: [id number]  HPI 51yo F with well controlled hiv disease-depression-hypertension, CD 4 count of 520/VL<20 on descovy/tivicay. She states that she has been missing a dose/late on dose once per week due to lack of wanting to take meds. "not a medicine day". She also feels that she has increased stress(did not elaborate) where she is increasing her alcohol consumption to 2 vodka drinks plus a beer per night. Drinks mostly at night, does not black out but reports poor sleep. She states this is more than she previously drank. She is interested in meeting with counselor.  Outpatient Encounter Prescriptions as of 05/01/2017  Medication Sig  . albuterol (PROVENTIL HFA;VENTOLIN HFA) 108 (90 Base) MCG/ACT inhaler Inhale 2 puffs into the lungs every 4 (four) hours as needed for wheezing or shortness of breath (cough, shortness of breath or wheezing.).  Marland Kitchen aspirin 81 MG tablet Take 81 mg by mouth daily.  . cetirizine (ZYRTEC) 10 MG tablet Take 1 tablet (10 mg total) by mouth daily.  . cimetidine (TAGAMET) 200 MG tablet Take 1 tablet (200 mg total) by mouth 2 (two) times daily.  . DESCOVY 200-25 MG tablet TAKE 1 TABLET BY MOUTH DAILY  . fluticasone (FLONASE) 50 MCG/ACT nasal spray Place 2 sprays into both nostrils daily.  Marland Kitchen gabapentin (NEURONTIN) 400 MG capsule TAKE ONE CAPSULE BY MOUTH 3 TIMES A DAY  . hydrochlorothiazide (HYDRODIURIL) 25 MG tablet TAKE 1 TABLET BY MOUTH EVERY DAY  . metoprolol succinate (TOPROL XL) 100 MG 24 hr tablet Take 1 tablet (100 mg total) by mouth daily. Take with or immediately following a meal.  . PARoxetine (PAXIL) 30 MG tablet Take 1 tablet (30 mg total) by mouth daily.  Marland Kitchen TIVICAY 50 MG tablet TAKE 1 TABLET(50 MG) BY MOUTH DAILY  . traZODone (DESYREL) 50 MG tablet Take 0.5 tablets (25 mg total) by mouth at bedtime as needed for sleep.  . cyclobenzaprine (FLEXERIL) 10 MG tablet  Take 1 tablet (10 mg total) by mouth 2 (two) times daily as needed for muscle spasms. (Patient not taking: Reported on 05/01/2017)  . emtricitabine-tenofovir (TRUVADA) 200-300 MG tablet Take 1 tablet by mouth daily.  . ergocalciferol (VITAMIN D2) 50000 units capsule Take 1 capsule (50,000 Units total) by mouth once a week. (Patient not taking: Reported on 05/01/2017)  . naltrexone (DEPADE) 50 MG tablet Take 1 tablet (50 mg total) by mouth daily.  . [DISCONTINUED] naltrexone (DEPADE) 50 MG tablet Take 1 tablet (50 mg total) by mouth daily.   No facility-administered encounter medications on file as of 05/01/2017.      Patient Active Problem List   Diagnosis Date Noted  . Other fatigue 05/16/2016  . Gait disturbance 06/22/2015  . 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 Tobacco dependence 06/02/2015  . Numbness and tingling of right arm 05/30/2015  . Right arm weakness 05/30/2015  . Arm numbness left 10/10/2014  . Swelling of upper lip 08/05/2014  . Vitamin D deficiency 08/05/2014  . Allergic rhinitis 05/25/2013  . Decreased leukocytes 05/25/2013  . 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 Allergy to drug 08/20/2012  . Cheek swelling 08/20/2012  . Polypharmacy 08/20/2012  . HIV (human immunodeficiency virus infection) ([place]) 08/20/2012  . Edema of face 07/15/2012  . HLD (hyperlipidemia) 07/15/2012  . BP (high blood pressure) 07/15/2012  . Cold hands 01/30/2012  . Cerebrovascular accident, late effects 01/30/2012  . Chronic sinusitis  10/08/2011  . Sinusitis 10/08/2011  . Depression with anxiety 07/15/2011  . Insomnia 07/15/2011  . Folliculitis 72/08/4708  . Seborrhea capitis 04/18/2011  . Seborrheic dermatitis 03/07/2011  . 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 VAGINAL DISCHARGE 12/26/2010  . NEVUS 08/29/2010  . AMENORRHEA 07/19/2010  . CELLULITIS AND ABSCESS OF UNSPECIFIED SITE 05/21/2010  . SKIN RASH 05/21/2010  . THRUSH 12/28/2009  . ACUTE BRONCHITIS  01/19/2009  . Human immunodeficiency virus (HIV) disease ([place]) 08/06/2007  . Essential hypertension 08/06/2007     Health Maintenance Due  Topic Date Due  . COLONOSCOPY  03/02/2014    Soc hx: increasing alcohol consumption per hpi Review of Systems + alcohol intake, bilateral arm numbness or weakness Physical Exam   BP 102/72   Pulse 64   Temp 97.9 F (36.6 C) (Oral)   Ht 5\' 3"  (1.6 m)   Wt 166 lb (75.3 kg)   LMP 08/04/2014   BMI 29.41 kg/m   Physical Exam  Constitutional:  oriented to person, place, and time. appears well-developed and well-nourished. No distress.  HENT: [place]/AT, PERRLA, no scleral icterus Mouth/Throat: Oropharynx is clear and moist. No oropharyngeal exudate.  Cardiovascular: Normal rate, regular rhythm and normal heart sounds. Exam reveals no gallop and no friction rub.  No murmur heard.  Pulmonary/Chest: Effort normal and breath sounds normal. No respiratory distress.  has no wheezes.  Lymphadenopathy: no cervical adenopathy. No axillary adenopathy Neurological: alert and oriented to person, place, and time.  Skin: Skin is warm and dry. No rash noted. No erythema.  Psychiatric: a normal mood and affect.  behavior is normal.   Lab Results  Component Value Date   CD4TCELL 31 (L) 01/23/2017   Lab Results  Component Value Date   CD4TABS 520 01/23/2017   CD4TABS 540 02/13/2016   CD4TABS 1,250 06/14/2015   Lab Results  Component Value Date   HIV1RNAQUANT <20 NOT DETECTED 01/23/2017   Lab Results  Component Value Date   HEPBSAB NEG 03/10/2014   Lab Results  Component Value Date   LABRPR NON REAC 01/23/2017    CBC Lab Results  Component Value Date   WBC 3.2 (L) 01/23/2017   RBC 4.78 01/23/2017   HGB 14.9 01/23/2017   HCT 43.8 01/23/2017   PLT 177 01/23/2017   MCV 91.6 01/23/2017   MCH 31.2 01/23/2017   MCHC 34.0 01/23/2017   RDW 13.7 01/23/2017   LYMPHSABS 1,536 01/23/2017   MONOABS 352 01/23/2017   EOSABS 160 01/23/2017     BMET Lab Results  Component Value Date   NA 141 01/23/2017   K 4.1 01/23/2017   CL 106 01/23/2017   CO2 28 01/23/2017   GLUCOSE 85 01/23/2017   BUN 12 01/23/2017   CREATININE 0.84 01/23/2017   CALCIUM 9.2 01/23/2017   GFRNONAA 80 01/23/2017   GFRAA >89 01/23/2017    Assessment and Plan  hiv disease = continue to take descovy-tivicay daily. We spent 10 min on adherence  Alcohol dependence = discussed starting naltrexone 50mg  daily to help curb alcohol craving. Willing to try for 1 month to see if it decreases her alcohol intake. Also she is planning to meet with [facility] drug-alcohol counselor  htn = well controlled, no change in meds  Peripheral neuropathy = patient getting cervical mri today to evaluate bilateral arm numbness

## 2017-05-02 ENCOUNTER — Telehealth: Payer: Self-pay

## 2017-05-02 NOTE — Telephone Encounter (Signed)
Called pt and relayed resent MRI results.

## 2017-05-13 ENCOUNTER — Telehealth: Payer: Self-pay | Admitting: Licensed Clinical Social Worker

## 2017-05-13 NOTE — Telephone Encounter (Signed)
Called to make appointment with patient for counseling services.  Received generic voicemail so I left brief generic message.

## 2017-05-21 ENCOUNTER — Ambulatory Visit (INDEPENDENT_AMBULATORY_CARE_PROVIDER_SITE_OTHER): Payer: Medicaid Other

## 2017-05-21 ENCOUNTER — Ambulatory Visit (INDEPENDENT_AMBULATORY_CARE_PROVIDER_SITE_OTHER): Payer: Medicaid Other | Admitting: Podiatry

## 2017-05-21 ENCOUNTER — Encounter: Payer: Self-pay | Admitting: Podiatry

## 2017-05-21 VITALS — BP 127/90 | HR 69 | Resp 16 | Ht 63.0 in | Wt 165.0 lb

## 2017-05-21 DIAGNOSIS — M2010 Hallux valgus (acquired), unspecified foot: Secondary | ICD-10-CM

## 2017-05-21 NOTE — Progress Notes (Signed)
Subjective:    Patient ID: Abigail Medina, female   DOB: 51 y.o.   MRN: [id number]   HPI patient presents stating I got painful bunions of both feet and I been trying wider shoes I tried to pad them and soak them without relief and they're gradually getting worse as time goes on    Review of Systems  All other systems reviewed and are negative.       Objective:  Physical Exam  Cardiovascular: Intact distal pulses.   Musculoskeletal: Normal range of motion.  Neurological: She is alert.  Skin: Skin is warm.  Nursing note and vitals reviewed.  neurovascular status is found to be intact with muscle strength adequate range of motion within normal limits. Patient's found to have redness and pain around the first metatarsal head bilateral with inflammation around the joint surface and mild deviation hallux against second toe. 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 She states that she needs to get it done right away due to her schedule and I did discuss that we can probably get this fixed within the next couple weeks. Her transportation is difficult and she wants to do consent form today and at this time I allowed her to read consent form going over all possible complications and alternative treatments as listed. She understands no guarantee of success of surgery and understands all risk and wants procedure and signs consent form after extensive review and is scheduled for outpatient procedure. Will be seen back after surgery which we can get done next week and she is strongly encouraged to call with any questions prior to procedure   X-ray report indicates that there is elevation of the intermetatarsal angle between the first and second metatarsals bilateral

## 2017-05-21 NOTE — Progress Notes (Signed)
   Subjective:    Patient ID: Abigail Medina, female    DOB: Jun 19, 1966, 51 y.o.   MRN: [id number]  HPI  Chief Complaint  Patient presents with  . Foot Pain    Bilateral; Bunions; pt stated, "bunions are burning and aching"; x1 yr     Review of Systems  All other systems reviewed and are negative.      Objective:   Physical Exam        Assessment & Plan:

## 2017-05-21 NOTE — Patient Instructions (Addendum)
Bunion A bunion is a bump on the base of the big toe that forms when the bones of the big toe joint move out of position. Bunions may be small at first, but they often get larger over time. The can make walking painful. What are the causes? A bunion may be caused by:  Wearing narrow or pointed shoes that force the big toe to press against the other toes.  Abnormal foot development that causes the foot to roll inward (pronate).  Changes in the foot that are caused by certain diseases, such as rheumatoid arthritis and polio.  A foot injury.  What increases the risk? The following factors may make you more likely to develop this condition:  Wearing shoes that squeeze the toes together.  Having certain diseases, such as: ? Rheumatoid arthritis. ? Polio. ? Cerebral palsy.  Having family members who have bunions.  Being born with a foot deformity, such as flat feet or low arches.  Doing activities that put a lot of pressure on the feet, such as ballet dancing.  What are the signs or symptoms? The main symptom of a bunion is a noticeable bump on the big toe. Other symptoms may include:  Pain.  Swelling around the big toe.  Redness and inflammation.  Thick or hardened skin on the big toe or between the toes.  Stiffness or loss of motion in the big toe.  Trouble with walking.  How is this diagnosed? A bunion may be diagnosed based on your symptoms, medical history, and activities. You may have tests, such as:  X-rays. These allow your health care provider to check the position of the bones in your foot and look for damage to your joint. They also help your health care provider to determine the severity of your bunion and the best way to treat it.  Joint aspiration. In this test, a sample of fluid is removed from the toe joint. This test, which may be done if you are in a lot of pain, helps to rule out diseases that cause painful swelling of the joints, such as  arthritis.  How is this treated? There is no cure for a bunion, but treatment can help to prevent a bunion from getting worse. Treatment depends on the severity of your symptoms. Your health care provider may recommend:  Wearing shoes that have a wide toe box.  Using bunion pads to cushion the affected area.  Taping your toes together to keep them in a normal position.  Placing a device inside your shoe (orthotics) to help reduce pressure on your toe joint.  Taking medicine to ease pain, inflammation, and swelling.  Applying heat or ice to the affected area.  Doing stretching exercises.  Surgery to remove scar tissue and move the toes back into their normal position. This treatment is rare.  Follow these instructions at home:  Support your toe joint with proper footwear, shoe padding, or taping as told by your health care provider.  Take over-the-counter and prescription medicines only as told by your health care provider.  If directed, apply ice to the injured area: ? Put ice in a plastic bag. ? Place a towel between your skin and the bag. ? Leave the ice on for 20 minutes, 2-3 times per day.  If directed, apply heat to the affected area before you exercise. Use the heat source that your health care provider recommends, such as a moist heat pack or a heating pad. ? Place a towel between your   skin and the heat source. ? Leave the heat on for 20-30 minutes. ? Remove the heat if your skin turns bright red. This is especially important if you are unable to feel pain, heat, or cold. You may have a greater risk of getting burned.  Do exercises as told by your health care provider.  Keep all follow-up visits as told by your health care provider. Contact a health care provider if:  Your symptoms get worse.  Your symptoms do not improve in 2 weeks. Get help right away if:  You have severe pain and trouble with walking. This information is not intended to replace advice given  to you by your health care provider. Make sure you discuss any questions you have with your health care provider. Document Released: 12/02/2005 Document Revised: 05/09/2016 Document Reviewed: 07/02/2015 Elsevier Interactive Patient Education  2018 [place] Instructions  Congratulations, you have decided to take an important step to improving your quality of life.  You can be assured that the doctors of [facility] will be with you every step of the way.  1. Plan to be at the surgery center/hospital at least 1 (one) hour prior to your scheduled time unless otherwise directed by the surgical center/hospital staff.  You must have a responsible adult accompany you, remain during the surgery and drive you home.  Make sure you have directions to the surgical center/hospital and know how to get there on time. 2. For hospital based surgery you will need to obtain a history and physical form from your family physician within 1 month prior to the date of surgery- we will give you a form for you primary physician.  3. We make every effort to accommodate the date you request for surgery.  There are however, times where surgery dates or times have to be moved.  We will contact you as soon as possible if a change in schedule is required.   4. No Aspirin/Ibuprofen for one week before surgery.  If you are on aspirin, any non-steroidal anti-inflammatory medications (Mobic, Aleve, Ibuprofen) you should stop taking it 7 days prior to your surgery.  You make take Tylenol  For pain prior to surgery.  5. Medications- If you are taking daily heart and blood pressure medications, seizure, reflux, allergy, asthma, anxiety, pain or diabetes medications, make sure the surgery center/hospital is aware before the day of surgery so they may notify you which medications to take or avoid the day of surgery. 6. No food or drink after midnight the night before surgery unless directed otherwise by surgical  center/hospital staff. 7. No alcoholic beverages 24 hours prior to surgery.  No smoking 24 hours prior to or 24 hours after surgery. 8. Wear loose pants or shorts- loose enough to fit over bandages, boots, and casts. 9. No slip on shoes, sneakers are best. 10. Bring your boot with you to the surgery center/hospital.  Also bring crutches or a walker if your physician has prescribed it for you.  If you do not have this equipment, it will be provided for you after surgery. 11. If you have not been contracted by the surgery center/hospital by the day before your surgery, call to confirm the date and time of your surgery. 12. Leave-time from work may vary depending on the type of surgery you have.  Appropriate arrangements should be made prior to surgery with your employer. 13. Prescriptions will be provided immediately following surgery by your doctor.  Have these filled as  soon as possible after surgery and take the medication as directed. 14. Remove nail polish on the operative foot. 15. Wash the night before surgery.  The night before surgery wash the foot and leg well with the antibacterial soap provided and water paying special attention to beneath the toenails and in between the toes.  Rinse thoroughly with water and dry well with a towel.  Perform this wash unless told not to do so by your physician.  Enclosed: 1 Ice pack (please put in freezer the night before surgery)   1 Hibiclens skin cleaner   Pre-op Instructions  If you have any questions regarding the instructions, do not hesitate to call our office.  [place]: [place], [place] [address] [place]: [address] [place], [place] [address] [phone]  [place]: [address], [place] [address] [phone]   Dr. Ila Mcgill DPM, Dr. Celesta Gentile DPM, Dr. Lanelle Bal DPM, Dr. Landis Martins DPM

## 2017-05-22 ENCOUNTER — Telehealth: Payer: Self-pay | Admitting: *Deleted

## 2017-05-22 NOTE — Telephone Encounter (Signed)
Per Levada Dy from the call,  the patient called and canceled her surgery for 05/27/2017.  She said she forgot about her daughter's graduation.  She said she would call back later to reschedule.

## 2017-05-22 NOTE — Telephone Encounter (Signed)
I went ahead and cancelled the first post-op appointment.

## 2017-05-26 NOTE — Progress Notes (Signed)
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 Baxter Flattery  Content of Session: Met with patient based on referral from the provider due to substance use and depressive symptoms.  Intervention Provided: Performed substance abuse and symptom assessment.  Educated patient on the impact of stroke on brain functioning and normalized grief and stress reactions.  Educated patient on treatment options and explained the therapy process. Provided hope for return to normal functioning.  Response: Patient reported that she first began drinking Alcohol at age 51 and as an adult drank mostly on the weekends.  Currently she reported that she is drinking daily due to increased stress.  Patient reported last use the previous night and consumed "a half of beer".  On most nights she drinks a half beer to 32 oz. of beer and two shots of whiskey.  Patient denied any other substance use and reported multiple stressors of 1) financial worries due to currently being on disability from a major stroke in 2012 and 2) medical issues.  Patient reported feeling worthless and unproductive because she is not able to but desires to work.  The patient also reported the following symptoms: depressed ("stressed out and tired") and irritable ("Sometimes I'm just mad") mood, decreased energy, feelings of hopelessness, feelings of guilt due to stroke and dependence on others, decreased appetite, decreased sleep, and grief from multiple deaths.  Patient reported "I don't like to go to bed because the day is over.  And you go to  bed and wake up with the same issues".  Patient reported non-specific general thoughts of death but denied current ideations, plan, or intent.  Patient was receptive to ways to access mental health emergency services if she develops ideations or plan.  Patient reported that she is currently being prescribed Paxil, which was recently increased to 30 mgs.  The patient also reported that she is taking sleeping pills also.  Patient was receptive to treatment options and wanted to schedule appointment for follow up visit.  Plan: [facility] will schedule follow-up visit with patient.  At follow-up visit with [facility] will administer Beck Depression Inventory, the AUDIT, and suicide assessment.  Sande Rives, [facility]

## 2017-05-29 ENCOUNTER — Encounter: Payer: Self-pay | Admitting: Internal Medicine

## 2017-05-29 ENCOUNTER — Ambulatory Visit (INDEPENDENT_AMBULATORY_CARE_PROVIDER_SITE_OTHER): Payer: Medicaid Other | Admitting: Internal Medicine

## 2017-05-29 VITALS — BP 127/92 | HR 71 | Temp 98.3°F | Wt 164.0 lb

## 2017-05-29 DIAGNOSIS — Z0000 Encounter for general adult medical examination without abnormal findings: Secondary | ICD-10-CM | POA: Diagnosis not present

## 2017-05-29 DIAGNOSIS — B20 Human immunodeficiency virus [HIV] disease: Secondary | ICD-10-CM

## 2017-05-29 DIAGNOSIS — F10220 Alcohol dependence with intoxication, uncomplicated: Secondary | ICD-10-CM | POA: Diagnosis present

## 2017-05-29 MED ORDER — DOLUTEGRAVIR SODIUM 50 MG PO TABS: ORAL_TABLET | ORAL | 5 refills | Status: DC

## 2017-05-29 MED ORDER — EMTRICITABINE-TENOFOVIR AF 200-25 MG PO TABS: 1.0000 | ORAL_TABLET | Freq: Every day | ORAL | 5 refills | Status: DC

## 2017-05-29 NOTE — Progress Notes (Signed)
RFV: hiv disease follow-up  Patient ID: Abigail Medina, female   DOB: 1966-05-03, 51 y.o.   MRN: [id number]  HPI 51yo F with HIV disease, well controlled. Seen last month concerned about alcohol use and started on naltrexone. She hasn't filled naltrexone. Has been decreasing her etoh intake on her own. Only had 2 drinks yesterday- in graduation party. Other than that no other drinking in the last week.  Did not meet counselor yet  Outpatient Encounter Prescriptions as of 05/29/2017  Medication Sig  . albuterol (PROVENTIL HFA;VENTOLIN HFA) 108 (90 Base) MCG/ACT inhaler Inhale 2 puffs into the lungs every 4 (four) hours as needed for wheezing or shortness of breath (cough, shortness of breath or wheezing.).  Marland Kitchen aspirin 81 MG tablet Take 81 mg by mouth daily.  . cetirizine (ZYRTEC) 10 MG tablet Take 1 tablet (10 mg total) by mouth daily.  . cimetidine (TAGAMET) 200 MG tablet Take 1 tablet (200 mg total) by mouth 2 (two) times daily.  . cyclobenzaprine (FLEXERIL) 10 MG tablet Take 1 tablet (10 mg total) by mouth 2 (two) times daily as needed for muscle spasms.  . DESCOVY 200-25 MG tablet TAKE 1 TABLET BY MOUTH DAILY  . emtricitabine-tenofovir (TRUVADA) 200-300 MG tablet Take 1 tablet by mouth daily.  . ergocalciferol (VITAMIN D2) 50000 units capsule Take 1 capsule (50,000 Units total) by mouth once a week.  . fluticasone (FLONASE) 50 MCG/ACT nasal spray Place 2 sprays into both nostrils daily.  Marland Kitchen gabapentin (NEURONTIN) 400 MG capsule TAKE ONE CAPSULE BY MOUTH 3 TIMES A DAY  . hydrochlorothiazide (HYDRODIURIL) 25 MG tablet TAKE 1 TABLET BY MOUTH EVERY DAY  . metoprolol succinate (TOPROL XL) 100 MG 24 hr tablet Take 1 tablet (100 mg total) by mouth daily. Take with or immediately following a meal.  . naltrexone (DEPADE) 50 MG tablet Take 1 tablet (50 mg total) by mouth daily.  Marland Kitchen PARoxetine (PAXIL) 30 MG tablet Take 1 tablet (30 mg total) by mouth daily.  Marland Kitchen TIVICAY 50 MG tablet TAKE 1 TABLET(50 MG)  BY MOUTH DAILY  . traZODone (DESYREL) 50 MG tablet Take 0.5 tablets (25 mg total) by mouth at bedtime as needed for sleep.   No facility-administered encounter medications on file as of 05/29/2017.      Patient Active Problem List   Diagnosis Date Noted  . Other fatigue 05/16/2016  . Gait disturbance 06/22/2015  . 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 Tobacco dependence 06/02/2015  . Numbness and tingling of right arm 05/30/2015  . Right arm weakness 05/30/2015  . Arm numbness left 10/10/2014  . Swelling of upper lip 08/05/2014  . Vitamin D deficiency 08/05/2014  . Allergic rhinitis 05/25/2013  . Decreased leukocytes 05/25/2013  . 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 Allergy to drug 08/20/2012  . Cheek swelling 08/20/2012  . Polypharmacy 08/20/2012  . HIV (human immunodeficiency virus infection) ([place]) 08/20/2012  . Edema of face 07/15/2012  . HLD (hyperlipidemia) 07/15/2012  . BP (high blood pressure) 07/15/2012  . Cold hands 01/30/2012  . Cerebrovascular accident, late effects 01/30/2012  . Chronic sinusitis 10/08/2011  . Sinusitis 10/08/2011  . Depression with anxiety 07/15/2011  . Insomnia 07/15/2011  . Folliculitis 40/98/1191  . Seborrhea capitis 04/18/2011  . Seborrheic dermatitis 03/07/2011  . Hemiplegia, late effect of cerebrovascular disease ([place]) 01/25/2011  . VAGINAL DISCHARGE 12/26/2010  . NEVUS 08/29/2010  . AMENORRHEA 07/19/2010  . CELLULITIS AND ABSCESS OF UNSPECIFIED SITE 05/21/2010  . SKIN RASH 05/21/2010  . THRUSH 12/28/2009  .  ACUTE BRONCHITIS 01/19/2009  . Human immunodeficiency virus (HIV) disease ([place]) 08/06/2007  . Essential hypertension 08/06/2007     Health Maintenance Due  Topic Date Due  . COLONOSCOPY  03/02/2014     Review of Systems  Physical Exam   BP (!) 127/92   Pulse 71   Temp 98.3 F (36.8 C) (Oral)   Wt 164 lb (74.4 kg)   LMP 08/04/2014   BMI 29.05 kg/m    Lab Results  Component Value Date   CD4TCELL 31 (L) 01/23/2017    Lab Results  Component Value Date   CD4TABS 520 01/23/2017   CD4TABS 540 02/13/2016   CD4TABS 1,250 06/14/2015   Lab Results  Component Value Date   HIV1RNAQUANT <20 NOT DETECTED 01/23/2017   Lab Results  Component Value Date   HEPBSAB NEG 03/10/2014   Lab Results  Component Value Date   LABRPR NON REAC 01/23/2017    CBC Lab Results  Component Value Date   WBC 3.2 (L) 01/23/2017   RBC 4.78 01/23/2017   HGB 14.9 01/23/2017   HCT 43.8 01/23/2017   PLT 177 01/23/2017   MCV 91.6 01/23/2017   MCH 31.2 01/23/2017   MCHC 34.0 01/23/2017   RDW 13.7 01/23/2017   LYMPHSABS 1,536 01/23/2017   MONOABS 352 01/23/2017   EOSABS 160 01/23/2017    BMET Lab Results  Component Value Date   NA 141 01/23/2017   K 4.1 01/23/2017   CL 106 01/23/2017   CO2 28 01/23/2017   GLUCOSE 85 01/23/2017   BUN 12 01/23/2017   CREATININE 0.84 01/23/2017   CALCIUM 9.2 01/23/2017   GFRNONAA 80 01/23/2017   GFRAA >89 01/23/2017      Assessment and Plan hiv disease = continue with current regimen. Well controlled hiv disease  Health maintenance = we will need to arrange for mammo and pap smear  Alcohol use = appears to be self regulating but is interested in counseling. She is not interested in taking naltrexone.  Financial difficulties = [facility] meds and avoid $3 copay

## 2017-06-02 ENCOUNTER — Ambulatory Visit: Payer: Medicaid Other

## 2017-06-02 NOTE — Telephone Encounter (Signed)
Please call her to reschedule.

## 2017-06-05 ENCOUNTER — Other Ambulatory Visit: Payer: Self-pay | Admitting: Internal Medicine

## 2017-06-05 DIAGNOSIS — G4700 Insomnia, unspecified: Secondary | ICD-10-CM

## 2017-06-12 ENCOUNTER — Ambulatory Visit (INDEPENDENT_AMBULATORY_CARE_PROVIDER_SITE_OTHER): Payer: Medicaid Other | Admitting: Licensed Clinical Social Worker

## 2017-06-12 DIAGNOSIS — F419 Anxiety disorder, unspecified: Secondary | ICD-10-CM

## 2017-06-12 DIAGNOSIS — F322 Major depressive disorder, single episode, severe without psychotic features: Secondary | ICD-10-CM

## 2017-06-13 NOTE — Progress Notes (Signed)
Integrated Behavioral Health Initial Visit  MRN: [id number] Name: Haillee Johann   Session Start time: 10:50 am Session End time: 12:10 pm Total time: 1 hour 20 mins  Type of Service: [place] Interpretor:No. Interpretor Name and Language: N/A   Warm Hand Off Completed.       SUBJECTIVE: Milagro Belmares is a 51 y.o. female accompanied by patient. Patient was referred by Dr. Baxter Flattery for depressive symptoms.  Patient reports the following symptoms/concerns: Severe depressed mood- "down", "I never felt so low in life"; anhedonia "I don't have the desire to do it anymore" speaking of cooking and painting; decreased appetite "I can go all day without eating"; irritability and being verbally aggressive with others causing problems with her relationships; increased fatigue "Tired all the time"; feelings of worthlessness due to limited physical ability after the stroke and due to financial concerns; distractibility and experiencing lapses in time; and reported general thoughts of death without a specific plan or intent.  Patient reported that suicide was not an option for her. Patient reported that her depressive symptoms increase and intensify as the day progresses.  Patient is also experiencing the following anxiety symptoms: racing thoughts that are future oriented and filled with worry.  Patient also shared that she wakes up during the night fearful that her financial needs will not be met.  Patient stated that she has a feeling of detachment from her body, that she is looking down at herself.  Patient also reported that she becomes over stimulated and does not like listening to music, and is afraid of the dark.  Patient reported during the session that the overhead light and the white noise box was over stimluating for her.  Patient denied history of receiving mental health treatment and denied history of or current substance abuse/use.  Patient has a history of  stroke which paralyzed her left side in 2012.  Patient reported feeling overwhelmed due to not working and limited physical ability.  Patient reported that she has applied for SSDI but has been denied and currently is suing the government.  In the meantime she is struggling financially and cannot work.  As a result of the stroke the patient reported that she falls a lot, cannot bend her left hand, has sleep disturbances, has short term memory recall issues and experiences loss of time.  Additionally the patient reported that she cannot relax because bill collectors are calling her nonstop.  Patient reported that the majority of her family is located in [place], although she has two friends and a boyfriend here.  Patient reported currently being prescribed Paxil for depression by her PCP Donia Pounds, which she last seen at the beginning of spring, and Trazadone for sleep by Dr. Baxter Flattery.   Duration of problem: since the stroke in 2012; Severity of problem: severe  OBJECTIVE: Mood: Anxious, Depressed and Hopeless and Affect: within range Risk of harm to self or others: No plan to harm self or others  Thought process: coherent Thought content: logical   LIFE CONTEXT: Family and Social: patient has six sisters (1 deceased) and 3 brothers (1 deceased). School/Work: Patient is unable to work after stroke and has applied for [employer]. Self-Care: Patient is able to tend to her ADL's. Life Changes: Patient moved to [facility] from [place] in January 2000 to make a better life for herself and her children.  GOALS ADDRESSED: Patient will reduce symptoms of: anxiety and depression and increase knowledge and/or ability of: coping skills, self-management skills  and stress reduction and also: Increase healthy adjustment to current life circumstances and Increase adequate support systems for patient/family   INTERVENTIONS: Motivational Interviewing, Supportive Counseling and Link to [employer]    ASSESSMENT: Patient currently experiencing severe depressive and anxiety symptoms and may benefit from referral to community mental health and psychiatry for full comprehensive assessment, symptom reduction, and medication management.  PLAN: 1. Referral(s): [facility] (LME/Outside Clinic) and Psychiatrist. Referred to [facility] and made appointment. 2. Appointment there is scheduled for Tuesday July 10th at 8:45 am 3. Provided patient with list of mobile crisis numbers and suicide hotline numbers 4. If patient is unable to keep appointment, she will contact them directly to reschedule. 5. If patient requires additional assistance with making mental health appointments, she will contact the [facility].  No further sessions will be scheduled with the [facility].  Sande Rives, [facility]

## 2017-06-24 ENCOUNTER — Ambulatory Visit: Payer: Medicaid Other | Admitting: Licensed Clinical Social Worker

## 2017-06-27 ENCOUNTER — Telehealth: Payer: Self-pay | Admitting: Licensed Clinical Social Worker

## 2017-06-27 NOTE — Telephone Encounter (Signed)
Spoke to patient about missed appointment on Tuesday.  She stated that she forgot to attend but has appointment scheduled for Monday July 16th at the [facility].  Patient agreed to  attend session on Monday and continue receiving mental health services there.  If patient forgets to attend the appointment she agreed to call the [facility] to make another appointment and will call the [facility] to make appointment in the interim.  Sande Rives, [facility]

## 2017-06-30 ENCOUNTER — Other Ambulatory Visit: Payer: Self-pay | Admitting: Family Medicine

## 2017-06-30 DIAGNOSIS — E559 Vitamin D deficiency, unspecified: Secondary | ICD-10-CM

## 2017-07-31 ENCOUNTER — Ambulatory Visit: Payer: Medicaid Other | Admitting: Family Medicine

## 2017-08-27 ENCOUNTER — Other Ambulatory Visit: Payer: Self-pay | Admitting: Internal Medicine

## 2017-08-27 DIAGNOSIS — I10 Essential (primary) hypertension: Secondary | ICD-10-CM

## 2017-09-02 ENCOUNTER — Ambulatory Visit: Payer: Medicaid Other | Admitting: Internal Medicine

## 2017-09-06 ENCOUNTER — Other Ambulatory Visit: Payer: Self-pay | Admitting: Internal Medicine

## 2017-09-06 DIAGNOSIS — B20 Human immunodeficiency virus [HIV] disease: Secondary | ICD-10-CM

## 2017-10-20 ENCOUNTER — Other Ambulatory Visit: Payer: Self-pay | Admitting: Family Medicine

## 2017-10-20 DIAGNOSIS — E559 Vitamin D deficiency, unspecified: Secondary | ICD-10-CM

## 2017-10-20 DIAGNOSIS — I10 Essential (primary) hypertension: Secondary | ICD-10-CM

## 2017-11-29 ENCOUNTER — Other Ambulatory Visit: Payer: Self-pay | Admitting: Family Medicine

## 2017-11-29 DIAGNOSIS — E559 Vitamin D deficiency, unspecified: Secondary | ICD-10-CM

## 2017-11-30 ENCOUNTER — Other Ambulatory Visit: Payer: Self-pay | Admitting: Family Medicine

## 2017-11-30 DIAGNOSIS — I10 Essential (primary) hypertension: Secondary | ICD-10-CM

## 2017-12-13 ENCOUNTER — Other Ambulatory Visit: Payer: Self-pay | Admitting: Family Medicine

## 2017-12-13 DIAGNOSIS — E559 Vitamin D deficiency, unspecified: Secondary | ICD-10-CM

## 2018-02-07 ENCOUNTER — Other Ambulatory Visit: Payer: Self-pay | Admitting: Family Medicine

## 2018-02-07 DIAGNOSIS — I10 Essential (primary) hypertension: Secondary | ICD-10-CM

## 2018-03-28 ENCOUNTER — Other Ambulatory Visit: Payer: Self-pay | Admitting: Family Medicine

## 2018-03-28 DIAGNOSIS — I10 Essential (primary) hypertension: Secondary | ICD-10-CM

## 2018-04-06 ENCOUNTER — Other Ambulatory Visit: Payer: Self-pay

## 2018-04-06 DIAGNOSIS — I10 Essential (primary) hypertension: Secondary | ICD-10-CM

## 2018-04-06 MED ORDER — HYDROCHLOROTHIAZIDE 25 MG PO TABS: 25.0000 mg | ORAL_TABLET | Freq: Every day | ORAL | 0 refills | Status: DC

## 2018-05-04 ENCOUNTER — Other Ambulatory Visit: Payer: Self-pay | Admitting: Family Medicine

## 2018-05-04 DIAGNOSIS — I10 Essential (primary) hypertension: Secondary | ICD-10-CM

## 2018-06-04 ENCOUNTER — Other Ambulatory Visit: Payer: Self-pay | Admitting: Family Medicine

## 2018-06-04 DIAGNOSIS — I10 Essential (primary) hypertension: Secondary | ICD-10-CM

## 2018-06-18 ENCOUNTER — Other Ambulatory Visit: Payer: Self-pay | Admitting: Family Medicine

## 2018-06-18 DIAGNOSIS — I10 Essential (primary) hypertension: Secondary | ICD-10-CM

## 2018-06-22 ENCOUNTER — Other Ambulatory Visit: Payer: Self-pay | Admitting: Family Medicine

## 2018-06-22 DIAGNOSIS — I10 Essential (primary) hypertension: Secondary | ICD-10-CM

## 2018-06-22 NOTE — Telephone Encounter (Signed)
NOT RECEIVED BY PHARMACY, PT STILL HAD REFILLS ON [facility]

## 2018-06-23 ENCOUNTER — Other Ambulatory Visit: Payer: Self-pay | Admitting: Internal Medicine

## 2018-06-23 DIAGNOSIS — I10 Essential (primary) hypertension: Secondary | ICD-10-CM

## 2018-06-30 ENCOUNTER — Other Ambulatory Visit: Payer: Self-pay

## 2018-06-30 DIAGNOSIS — I10 Essential (primary) hypertension: Secondary | ICD-10-CM

## 2018-06-30 MED ORDER — HYDROCHLOROTHIAZIDE 25 MG PO TABS: 25.0000 mg | ORAL_TABLET | Freq: Every day | ORAL | 1 refills | Status: DC

## 2018-07-31 ENCOUNTER — Other Ambulatory Visit: Payer: Self-pay | Admitting: Internal Medicine

## 2018-07-31 DIAGNOSIS — B20 Human immunodeficiency virus [HIV] disease: Secondary | ICD-10-CM

## 2018-07-31 DIAGNOSIS — I10 Essential (primary) hypertension: Secondary | ICD-10-CM

## 2018-08-03 ENCOUNTER — Telehealth: Payer: Self-pay | Admitting: *Deleted

## 2018-08-03 NOTE — Telephone Encounter (Signed)
RN received refill request for Metoprolol. Patient has not been seen since 05/29/17. Her last labs were from 02/04/17.  RN attempted to call patient for appointment, but the call could not be completed, no voicemail available. Refill denied, asked pharmacy/patient to call for an appointment. Landis Gandy, RN

## 2018-08-31 ENCOUNTER — Other Ambulatory Visit: Payer: Self-pay | Admitting: *Deleted

## 2018-08-31 DIAGNOSIS — B20 Human immunodeficiency virus [HIV] disease: Secondary | ICD-10-CM

## 2018-08-31 MED ORDER — EMTRICITABINE-TENOFOVIR AF 200-25 MG PO TABS: 1.0000 | ORAL_TABLET | Freq: Every day | ORAL | 0 refills | Status: DC

## 2018-08-31 MED ORDER — DOLUTEGRAVIR SODIUM 50 MG PO TABS: ORAL_TABLET | ORAL | 0 refills | Status: DC

## 2018-09-07 ENCOUNTER — Encounter: Payer: Self-pay | Admitting: Internal Medicine

## 2018-09-07 ENCOUNTER — Other Ambulatory Visit (HOSPITAL_COMMUNITY)
Admission: RE | Admit: 2018-09-07 | Discharge: 2018-09-07 | Disposition: A | Discharge status: Home / Self Care | Payer: Medicare Other | Source: Ambulatory Visit | Attending: Internal Medicine | Admitting: Internal Medicine

## 2018-09-07 ENCOUNTER — Ambulatory Visit (INDEPENDENT_AMBULATORY_CARE_PROVIDER_SITE_OTHER): Payer: Medicare Other | Admitting: Internal Medicine

## 2018-09-07 VITALS — BP 151/113 | HR 85 | Temp 97.6°F | Wt 161.0 lb

## 2018-09-07 DIAGNOSIS — G629 Polyneuropathy, unspecified: Secondary | ICD-10-CM

## 2018-09-07 DIAGNOSIS — I10 Essential (primary) hypertension: Secondary | ICD-10-CM

## 2018-09-07 DIAGNOSIS — B20 Human immunodeficiency virus [HIV] disease: Secondary | ICD-10-CM

## 2018-09-07 DIAGNOSIS — F411 Generalized anxiety disorder: Secondary | ICD-10-CM | POA: Diagnosis not present

## 2018-09-07 MED ORDER — PAROXETINE HCL 30 MG PO TABS: 30.0000 mg | ORAL_TABLET | Freq: Every day | ORAL | 11 refills | Status: DC

## 2018-09-07 MED ORDER — DOLUTEGRAVIR SODIUM 50 MG PO TABS: ORAL_TABLET | ORAL | 11 refills | Status: DC

## 2018-09-07 MED ORDER — GABAPENTIN 400 MG PO CAPS: 400.0000 mg | ORAL_CAPSULE | Freq: Three times a day (TID) | ORAL | 5 refills | Status: DC

## 2018-09-07 MED ORDER — METOPROLOL SUCCINATE ER 100 MG PO TB24: 100.0000 mg | ORAL_TABLET | Freq: Every day | ORAL | 11 refills | Status: DC

## 2018-09-07 MED ORDER — HYDROCHLOROTHIAZIDE 25 MG PO TABS: 25.0000 mg | ORAL_TABLET | Freq: Every day | ORAL | 11 refills | Status: DC

## 2018-09-07 MED ORDER — EMTRICITABINE-TENOFOVIR AF 200-25 MG PO TABS: 1.0000 | ORAL_TABLET | Freq: Every day | ORAL | 11 refills | Status: DC

## 2018-09-07 MED ORDER — CETIRIZINE HCL 10 MG PO TABS: 10.0000 mg | ORAL_TABLET | Freq: Every day | ORAL | 11 refills | Status: DC

## 2018-09-07 NOTE — Progress Notes (Signed)
RFV: follow up to clinic, last seen 18 months ago  Patient ID: Abigail Medina, female   DOB: [birth year], 52 y.o.   MRN: [id number]  HPI 52yo F with hiv disease, previously well controlled, on tivicay and descovy. Has been in good health. Cares for her grandkids. Has noticed that she needs to start taking care of herself.   Outpatient Encounter Medications as of 09/07/2018  Medication Sig  . albuterol (PROVENTIL HFA;VENTOLIN HFA) 108 (90 Base) MCG/ACT inhaler Inhale 2 puffs into the lungs every 4 (four) hours as needed for wheezing or shortness of breath (cough, shortness of breath or wheezing.).  Marland Kitchen aspirin 81 MG tablet Take 81 mg by mouth daily.  . cetirizine (ZYRTEC) 10 MG tablet Take 1 tablet (10 mg total) by mouth daily.  . dolutegravir (TIVICAY) 50 MG tablet TAKE 1 TABLET(50 MG) BY MOUTH DAILY  . emtricitabine-tenofovir AF (DESCOVY) 200-25 MG tablet Take 1 tablet by mouth daily.  . fluticasone (FLONASE) 50 MCG/ACT nasal spray Place 2 sprays into both nostrils daily.  Marland Kitchen gabapentin (NEURONTIN) 400 MG capsule TAKE ONE CAPSULE BY MOUTH 3 TIMES A DAY  . hydrochlorothiazide (HYDRODIURIL) 25 MG tablet Take 1 tablet (25 mg total) by mouth daily.  . metoprolol succinate (TOPROL-XL) 100 MG 24 hr tablet TAKE 1 TABLET BY MOUTH DAILY. TAKE WITH OR IMMEDIATELY FOLLOWING A MEAL.  . traZODone (DESYREL) 50 MG tablet TAKE 0.5 TABLETS (25 MG TOTAL) BY MOUTH AT BEDTIME AS NEEDED FOR SLEEP.  . Vitamin D, Ergocalciferol, (DRISDOL) 50000 units CAPS capsule TAKE ONE CAPSULE BY MOUTH ONE TIME PER WEEK  . cimetidine (TAGAMET) 200 MG tablet Take 1 tablet (200 mg total) by mouth 2 (two) times daily. (Patient not taking: Reported on 09/07/2018)  . PARoxetine (PAXIL) 30 MG tablet Take 1 tablet (30 mg total) by mouth daily. (Patient not taking: Reported on 09/07/2018)  . [DISCONTINUED] lisinopril (PRINIVIL,ZESTRIL) 10 MG tablet TAKE 1 TABLET BY MOUTH EVERY DAY   No facility-administered encounter medications on file as  of 09/07/2018.      Patient Active Problem List   Diagnosis Date Noted  . Other fatigue 05/16/2016  . Gait disturbance 06/22/2015  . 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 Tobacco dependence 06/02/2015  . Numbness and tingling of right arm 05/30/2015  . Right arm weakness 05/30/2015  . Arm numbness left 10/10/2014  . Swelling of upper lip 08/05/2014  . Vitamin D deficiency 08/05/2014  . Allergic rhinitis 05/25/2013  . Decreased leukocytes 05/25/2013  . 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 Allergy to drug 08/20/2012  . Cheek swelling 08/20/2012  . Polypharmacy 08/20/2012  . HIV (human immunodeficiency virus infection) ([place]) 08/20/2012  . Edema of face 07/15/2012  . HLD (hyperlipidemia) 07/15/2012  . BP (high blood pressure) 07/15/2012  . Cold hands 01/30/2012  . Cerebrovascular accident, late effects 01/30/2012  . Chronic sinusitis 10/08/2011  . Sinusitis 10/08/2011  . Depression with anxiety 07/15/2011  . Insomnia 07/15/2011  . Folliculitis 73/71/0626  . Seborrhea capitis 04/18/2011  . Seborrheic dermatitis 03/07/2011  . Hemiplegia, late effect of cerebrovascular disease ([place]) 01/25/2011  . VAGINAL DISCHARGE 12/26/2010  . NEVUS 08/29/2010  . AMENORRHEA 07/19/2010  . CELLULITIS AND ABSCESS OF UNSPECIFIED SITE 05/21/2010  . SKIN RASH 05/21/2010  . THRUSH 12/28/2009  . ACUTE BRONCHITIS 01/19/2009  . Human immunodeficiency virus (HIV) disease ([place]) 08/06/2007  . Essential hypertension 08/06/2007     Health Maintenance Due  Topic Date Due  . COLONOSCOPY  03/02/2014  . PAP SMEAR  08/19/2017  .  INFLUENZA VACCINE  07/16/2018     Review of Systems Review of Systems  Constitutional: Negative for fever, chills, diaphoresis, activity change, appetite change, fatigue and unexpected weight change.  HENT: Negative for congestion, sore throat, rhinorrhea, sneezing, trouble swallowing and sinus pressure.  Eyes: Negative for photophobia and visual disturbance.  Respiratory: Negative  for cough, chest tightness, shortness of breath, wheezing and stridor.  Cardiovascular: Negative for chest pain, palpitations and leg swelling.  Gastrointestinal: Negative for nausea, vomiting, abdominal pain, diarrhea, constipation, blood in stool, abdominal distention and anal bleeding.  Genitourinary: Negative for dysuria, hematuria, flank pain and difficulty urinating.  Musculoskeletal: Negative for myalgias, back pain, joint swelling, arthralgias and gait problem.  Skin: Negative for color change, pallor, rash and wound.  Neurological: Negative for dizziness, tremors, weakness and light-headedness.  Hematological: Negative for adenopathy. Does not bruise/bleed easily.  Psychiatric/Behavioral: occasionally has anxiety  Physical Exam   BP (!) 151/113   Pulse 85   Temp 97.6 F (36.4 C) (Oral)   Wt 161 lb (73 kg)   LMP 08/04/2014   BMI 28.52 kg/m   Physical Exam  Constitutional:  oriented to person, place, and time. appears well-developed and well-nourished. No distress.  HENT: [place]/AT, PERRLA, no scleral icterus Mouth/Throat: Oropharynx is clear and moist. No oropharyngeal exudate.  Cardiovascular: Normal rate, regular rhythm and normal heart sounds. Exam reveals no gallop and no friction rub.  No murmur heard.  Pulmonary/Chest: Effort normal and breath sounds normal. No respiratory distress.  has no wheezes.  Neck = supple, no nuchal rigidity Abdominal: Soft. Bowel sounds are normal.  exhibits no distension. There is no tenderness.  Lymphadenopathy: no cervical adenopathy. No axillary adenopathy Neurological: alert and oriented to person, place, and time.  Skin: Skin is warm and dry. No rash noted. No erythema.  Psychiatric: a normal mood and affect.  behavior is normal.   Lab Results  Component Value Date   CD4TCELL 31 (L) 01/23/2017   Lab Results  Component Value Date   CD4TABS 520 01/23/2017   CD4TABS 540 02/13/2016   CD4TABS 1,250 06/14/2015   Lab Results  Component  Value Date   HIV1RNAQUANT <20 NOT DETECTED 01/23/2017   Lab Results  Component Value Date   HEPBSAB NEG 03/10/2014   Lab Results  Component Value Date   LABRPR NON REAC 01/23/2017    CBC Lab Results  Component Value Date   WBC 3.2 (L) 01/23/2017   RBC 4.78 01/23/2017   HGB 14.9 01/23/2017   HCT 43.8 01/23/2017   PLT 177 01/23/2017   MCV 91.6 01/23/2017   MCH 31.2 01/23/2017   MCHC 34.0 01/23/2017   RDW 13.7 01/23/2017   LYMPHSABS 1,536 01/23/2017   MONOABS 352 01/23/2017   EOSABS 160 01/23/2017    BMET Lab Results  Component Value Date   NA 141 01/23/2017   K 4.1 01/23/2017   CL 106 01/23/2017   CO2 28 01/23/2017   GLUCOSE 85 01/23/2017   BUN 12 01/23/2017   CREATININE 0.84 01/23/2017   CALCIUM 9.2 01/23/2017   GFRNONAA 80 01/23/2017   GFRAA >89 01/23/2017      Assessment and Plan   htn = not at goal, will refill both meds  hiv = will check labs, plan to continue on current regimen  Anxiety = refill paxil  Neuropathy = will refill gabapentin

## 2018-09-08 LAB — T-HELPER CELL (CD4) - (RCID CLINIC ONLY)
CD4 % Helper T Cell: 35 % (ref 33–55)
CD4 T Cell Abs: 520 /uL (ref 400–2700)

## 2018-09-08 LAB — URINE CYTOLOGY ANCILLARY ONLY
CHLAMYDIA, DNA PROBE: NEGATIVE
Neisseria Gonorrhea: NEGATIVE

## 2018-09-09 LAB — CBC WITH DIFFERENTIAL/PLATELET
BASOS PCT: 0.3 %
Basophils Absolute: 9 cells/uL (ref 0–200)
EOS PCT: 5 %
Eosinophils Absolute: 150 cells/uL (ref 15–500)
HCT: 43.7 % (ref 35.0–45.0)
Hemoglobin: 15.5 g/dL (ref 11.7–15.5)
Lymphs Abs: 1416 cells/uL (ref 850–3900)
MCH: 31.6 pg (ref 27.0–33.0)
MCHC: 35.5 g/dL (ref 32.0–36.0)
MCV: 89.2 fL (ref 80.0–100.0)
MONOS PCT: 7.6 %
MPV: 10.9 fL (ref 7.5–12.5)
Neutro Abs: 1197 cells/uL — ABNORMAL LOW (ref 1500–7800)
Neutrophils Relative %: 39.9 %
PLATELETS: 185 10*3/uL (ref 140–400)
RBC: 4.9 10*6/uL (ref 3.80–5.10)
RDW: 12.4 % (ref 11.0–15.0)
TOTAL LYMPHOCYTE: 47.2 %
WBC mixed population: 228 cells/uL (ref 200–950)
WBC: 3 10*3/uL — AB (ref 3.8–10.8)

## 2018-09-09 LAB — COMPLETE METABOLIC PANEL WITH GFR
AG RATIO: 1.3 (calc) (ref 1.0–2.5)
ALT: 18 U/L (ref 6–29)
AST: 20 U/L (ref 10–35)
Albumin: 3.9 g/dL (ref 3.6–5.1)
Alkaline phosphatase (APISO): 76 U/L (ref 33–130)
BUN: 12 mg/dL (ref 7–25)
CALCIUM: 9.2 mg/dL (ref 8.6–10.4)
CHLORIDE: 102 mmol/L (ref 98–110)
CO2: 29 mmol/L (ref 20–32)
Creat: 0.77 mg/dL (ref 0.50–1.05)
GFR, EST NON AFRICAN AMERICAN: 88 mL/min/{1.73_m2} (ref >=60)
GFR, Est African American: 101 mL/min/{1.73_m2} (ref >=60)
GLUCOSE: 96 mg/dL (ref 65–99)
Globulin: 3.1 g/dL (calc) (ref 1.9–3.7)
POTASSIUM: 3.4 mmol/L — AB (ref 3.5–5.3)
Sodium: 138 mmol/L (ref 135–146)
TOTAL PROTEIN: 7 g/dL (ref 6.1–8.1)
Total Bilirubin: 1 mg/dL (ref 0.2–1.2)

## 2018-09-09 LAB — RPR: RPR Ser Ql: NONREACTIVE

## 2018-09-09 LAB — HIV-1 RNA QUANT-NO REFLEX-BLD
HIV 1 RNA Quant: <20 copies/mL
HIV-1 RNA Quant, Log: <1.3 Log copies/mL

## 2018-09-25 ENCOUNTER — Other Ambulatory Visit: Payer: Self-pay | Admitting: Internal Medicine

## 2018-09-25 DIAGNOSIS — B20 Human immunodeficiency virus [HIV] disease: Secondary | ICD-10-CM

## 2018-09-30 ENCOUNTER — Other Ambulatory Visit: Payer: Self-pay | Admitting: Behavioral Health

## 2018-09-30 ENCOUNTER — Other Ambulatory Visit: Payer: Self-pay | Admitting: Internal Medicine

## 2018-09-30 DIAGNOSIS — G629 Polyneuropathy, unspecified: Secondary | ICD-10-CM

## 2018-09-30 MED ORDER — GABAPENTIN 400 MG PO CAPS: 400.0000 mg | ORAL_CAPSULE | Freq: Three times a day (TID) | ORAL | 5 refills | Status: DC

## 2018-12-07 ENCOUNTER — Ambulatory Visit: Payer: Medicare Other | Admitting: Internal Medicine

## 2018-12-15 ENCOUNTER — Encounter: Payer: Self-pay | Admitting: Infectious Diseases

## 2018-12-15 ENCOUNTER — Ambulatory Visit (INDEPENDENT_AMBULATORY_CARE_PROVIDER_SITE_OTHER): Payer: Medicare Other | Admitting: Infectious Diseases

## 2018-12-15 VITALS — BP 125/101 | HR 60 | Temp 97.7°F | Wt 163.0 lb

## 2018-12-15 DIAGNOSIS — J069 Acute upper respiratory infection, unspecified: Secondary | ICD-10-CM | POA: Insufficient documentation

## 2018-12-15 DIAGNOSIS — M79605 Pain in left leg: Secondary | ICD-10-CM | POA: Diagnosis not present

## 2018-12-15 DIAGNOSIS — M549 Dorsalgia, unspecified: Secondary | ICD-10-CM

## 2018-12-15 MED ORDER — PREDNISONE 10 MG PO TABS: 10.0000 mg | ORAL_TABLET | Freq: Every day | ORAL | 0 refills | Status: DC

## 2018-12-15 MED ORDER — FLUTICASONE PROPIONATE 50 MCG/ACT NA SUSP: 2.0000 | Freq: Every day | NASAL | 2 refills | Status: DC

## 2018-12-15 MED ORDER — ALBUTEROL SULFATE HFA 108 (90 BASE) MCG/ACT IN AERS: 2.0000 | INHALATION_SPRAY | RESPIRATORY_TRACT | 6 refills | Status: DC | PRN

## 2018-12-15 NOTE — Assessment & Plan Note (Signed)
No mechanism of injury with normal exam aside from some tightness at left lower back. Normal ROM with normal gait. She has no weakness on exam. Seems she is having an acute flare of known sciatica. Will give burst steroids to treat acute inflammation in her back and have her follow with short course of routine NSAIDs for 5-7 days. Rest, heat, ice, topical therapy, stretches and proper foot ware discussed. If she fails conservative treatment for this will refer to orthopedics for assistance in work up and care.

## 2018-12-15 NOTE — Progress Notes (Signed)
Name: Abigail Medina  DOB: 1966/11/13 MRN: [id number] PCP: Dorena Dew, FNP    Patient Active Problem List   Diagnosis Date Noted  . Pain of back and left lower extremity 12/15/2018  . Upper respiratory infection, viral 12/15/2018  . 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 Tobacco dependence 06/02/2015  . Numbness and tingling of right arm 05/30/2015  . Right arm weakness 05/30/2015  . Arm numbness left 10/10/2014  . Vitamin D deficiency 08/05/2014  . Allergic rhinitis 05/25/2013  . 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 Polypharmacy 08/20/2012  . HIV (human immunodeficiency virus infection) ([place]) 08/20/2012  . HLD (hyperlipidemia) 07/15/2012  . BP (high blood pressure) 07/15/2012  . Cerebrovascular accident, late effects 01/30/2012  . Depression with anxiety 07/15/2011  . Seborrhea capitis 04/18/2011  . Seborrheic dermatitis 03/07/2011  . Hemiplegia, late effect of cerebrovascular disease ([place]) 01/25/2011  . VAGINAL DISCHARGE 12/26/2010  . NEVUS 08/29/2010  . AMENORRHEA 07/19/2010  . SKIN RASH 05/21/2010  . THRUSH 12/28/2009  . Human immunodeficiency virus (HIV) disease ([place]) 08/06/2007  . Essential hypertension 08/06/2007     Subjective:  CC:  Cough, chest/nasal congestion x 4 weeks. Lower left leg/back pain. Worried about kidney infection.    HPI:  Abigail Medina is a 52 y.o. female with well controlled HIV here for sick visit. Since her flu shot she feels like she has had an ongoing morning cough, nasal/chest congestion. She feels that the cough is worse in the morning and clears out throughout the day. Describes white/clear phlegm in AM that resolves a few hours after waking. Daily ongoing nasal congestion w/o rhinorrhea but does have some drainage down the back of her throat. She has some clear watery drainage from eyes intermittently and has had some sneezing. Continues to take her zyrtec most days during the week but has run out of her Flonase nasal spray and inhaler. She  does not feel poor; denies fevers/chills, fatigue, night sweats, ear pain, sinus/facial pain, degreased appetite or shortness of breath. She has been using mucinex daily for cough with moderate effect. She is a daily smoker but has significantly reduced # cigs down to < 5 daily.   She has also been experiencing some lower back pain that she has had in the past. Describes some shooting pains that radiate down from her buttock to leg. No weakness and no altered gait. Can bend over and perform ADLs per routine but has some pulling down her back. Has been using left over muscle relaxers that have helped a little but make her very sleepy. No other ice/anti-inflammatory treatment. No dysuria, abdominal pain or upper back/flank tenderness. She has a history of CVA with some mild left sided weakness in grip. She is on her feet a lot at home and wears house shoes.   Review of Systems  Constitutional: Negative for chills, diaphoresis, fever, malaise/fatigue and weight loss.  HENT: Positive for congestion and sore throat. Negative for ear pain and sinus pain.   Eyes: Positive for discharge (watery).  Respiratory: Positive for cough and sputum production (in early morning, white/clear phlegm ). Negative for shortness of breath.   Cardiovascular: Negative for chest pain.  Gastrointestinal: Negative for abdominal pain and diarrhea.  Genitourinary: Negative.   Musculoskeletal: Positive for back pain.  Skin: Negative for rash.  Neurological: Negative for dizziness and headaches.    Past Medical History:  Diagnosis Date  . HIV (human immunodeficiency virus infection) ([place])   . Hypertension   . Seizures ([place])   .  Stroke ([place])   . Vision abnormalities     Outpatient Medications Prior to Visit  Medication Sig Dispense Refill  . aspirin 81 MG tablet Take 81 mg by mouth daily.    . cetirizine (ZYRTEC) 10 MG tablet Take 1 tablet (10 mg total) by mouth daily. 30 tablet 11  . cimetidine (TAGAMET) 200 MG tablet  Take 1 tablet (200 mg total) by mouth 2 (two) times daily. 30 tablet 11  . dolutegravir (TIVICAY) 50 MG tablet TAKE 1 TABLET(50 MG) BY MOUTH DAILY 30 tablet 11  . emtricitabine-tenofovir AF (DESCOVY) 200-25 MG tablet Take 1 tablet by mouth daily. 30 tablet 11  . gabapentin (NEURONTIN) 400 MG capsule Take 1 capsule (400 mg total) by mouth 3 (three) times daily. Start taking 1 tab daily x 7d, then twice a day x 7d, then can do TID 90 capsule 5  . hydrochlorothiazide (HYDRODIURIL) 25 MG tablet Take 1 tablet (25 mg total) by mouth daily. 90 tablet 11  . metoprolol succinate (TOPROL-XL) 100 MG 24 hr tablet Take 1 tablet (100 mg total) by mouth daily. Take with or immediately following a meal. 30 tablet 11  . PARoxetine (PAXIL) 30 MG tablet Take 1 tablet (30 mg total) by mouth daily. 30 tablet 11  . traZODone (DESYREL) 50 MG tablet TAKE 0.5 TABLETS (25 MG TOTAL) BY MOUTH AT BEDTIME AS NEEDED FOR SLEEP. 30 tablet 3  . Vitamin D, Ergocalciferol, (DRISDOL) 50000 units CAPS capsule TAKE ONE CAPSULE BY MOUTH ONE TIME PER WEEK 4 capsule 0  . albuterol (PROVENTIL HFA;VENTOLIN HFA) 108 (90 Base) MCG/ACT inhaler Inhale 2 puffs into the lungs every 4 (four) hours as needed for wheezing or shortness of breath (cough, shortness of breath or wheezing.). 1 Inhaler 6  . fluticasone (FLONASE) 50 MCG/ACT nasal spray Place 2 sprays into both nostrils daily.     No facility-administered medications prior to visit.      Allergies  Allergen Reactions  . Lisinopril Swelling    Social History   Tobacco Use  . Smoking status: Former Smoker    Packs/day: 0.25    Types: Cigarettes    Start date: 10/17/2015  . Smokeless tobacco: Never Used  Substance Use Topics  . Alcohol use: Yes    Alcohol/week: 2.0 - 3.0 standard drinks    Types: 2 - 3 Standard drinks or equivalent per week    Comment: liquor or beer; stated has increased with social meetings  . Drug use: No    Family History  Problem Relation Age of Onset  .  Hypertension Mother     Social History   Substance and Sexual Activity  Sexual Activity Yes  . Partners: Male  . Birth control/protection: Condom   Comment: accepted condoms     Objective:   Vitals:   12/15/18 1028  BP: (!) 125/101  Pulse: 60  Temp: 97.7 F (36.5 C)  Weight: 163 lb (73.9 kg)   Body mass index is 28.87 kg/m.  Physical Exam Constitutional:      Appearance: Normal appearance. She is not ill-appearing.  HENT:     Right Ear: Tympanic membrane and ear canal normal. No middle ear effusion.     Left Ear: Tympanic membrane and ear canal normal.  No middle ear effusion.     Nose: Congestion present. No mucosal edema or rhinorrhea.     Right Turbinates: Pale. Not swollen.     Left Turbinates: Pale. Not swollen.     Right Sinus: No maxillary sinus  tenderness or frontal sinus tenderness.     Left Sinus: No maxillary sinus tenderness or frontal sinus tenderness.     Mouth/Throat:     Mouth: Mucous membranes are dry. No oral lesions.     Pharynx: No pharyngeal swelling, oropharyngeal exudate or posterior oropharyngeal erythema.     Tonsils: No tonsillar exudate.  Cardiovascular:     Rate and Rhythm: Normal rate and regular rhythm.     Pulses: Normal pulses.     Heart sounds: Normal heart sounds. No murmur.  Pulmonary:     Effort: Pulmonary effort is normal. No respiratory distress.     Breath sounds: Wheezing (bilateral upper airways) and rhonchi (bilateral upper airways) present. No rales (lower lobes clear with good air exchange).  Abdominal:     General: Abdomen is flat. There is no distension.  Musculoskeletal:     Lumbar back: She exhibits tenderness. She exhibits normal range of motion, no bony tenderness, no swelling, no edema, no deformity and no spasm.       Back:  Lymphadenopathy:     Head:     Right side of head: No tonsillar, preauricular or posterior auricular adenopathy.     Left side of head: No tonsillar, preauricular or posterior auricular  adenopathy.     Cervical: No cervical adenopathy.  Neurological:     Mental Status: She is alert.     Lab Results Lab Results  Component Value Date   WBC 3.0 (L) 09/07/2018   HGB 15.5 09/07/2018   HCT 43.7 09/07/2018   MCV 89.2 09/07/2018   PLT 185 09/07/2018    Lab Results  Component Value Date   CREATININE 0.77 09/07/2018   BUN 12 09/07/2018   NA 138 09/07/2018   K 3.4 (L) 09/07/2018   CL 102 09/07/2018   CO2 29 09/07/2018    Lab Results  Component Value Date   ALT 18 09/07/2018   AST 20 09/07/2018   ALKPHOS 70 01/23/2017   BILITOT 1.0 09/07/2018    Lab Results  Component Value Date   CHOL 192 01/23/2017   HDL 61 01/23/2017   LDLCALC 109 (H) 01/23/2017   TRIG 109 01/23/2017   CHOLHDL 3.1 01/23/2017   HIV 1 RNA Quant (copies/mL)  Date Value  09/07/2018 <20 NOT DETECTED  01/23/2017 <20 NOT DETECTED  02/13/2016 28 (H)   CD4 T Cell Abs (/uL)  Date Value  09/07/2018 520  01/23/2017 520  02/13/2016 540     Assessment & Plan:   Problem List Items Addressed This Visit      Unprioritized   Pain of back and left lower extremity - Primary    No mechanism of injury with normal exam aside from some tightness at left lower back. Normal ROM with normal gait. She has no weakness on exam. Seems she is having an acute flare of known sciatica. Will give burst steroids to treat acute inflammation in her back and have her follow with short course of routine NSAIDs for 5-7 days. Rest, heat, ice, topical therapy, stretches and proper foot ware discussed. If she fails conservative treatment for this will refer to orthopedics for assistance in work up and care.       Relevant Medications   predniSONE (DELTASONE) 10 MG tablet   Upper respiratory infection, viral    History of allergic rhinitis, not currently on maintenance medications. She does have some coarse upper airway rhonchi and wheezing - will treat with steroids, refill inhaler and refill flonase. Daily use  of  zyrtec. Discussed ongoing use of mucinex, rest, fluids. She does not have any findings concerning for secondary bacterial infection at this time. Advised that should she worsen or develop new symptoms including purulent sputum or fevers would place order for CXR.          Janene Madeira, MSN, NP-C [facility] Pager: [phone] Office: [phone]  12/15/18  5:28 PM

## 2018-12-15 NOTE — Assessment & Plan Note (Signed)
History of allergic rhinitis, not currently on maintenance medications. She does have some coarse upper airway rhonchi and wheezing - will treat with steroids, refill inhaler and refill flonase. Daily use of zyrtec. Discussed ongoing use of mucinex, rest, fluids. She does not have any findings concerning for secondary bacterial infection at this time. Advised that should she worsen or develop new symptoms including purulent sputum or fevers would place order for CXR.

## 2018-12-15 NOTE — Patient Instructions (Addendum)
For Your Cold: General Recommendations:    Please drink plenty of fluids.  Get plenty of rest   Sleep in humidified air  Use saline nasal sprays  Netti pot   Please fill your inhaler and start using to help with upper airway inflammation.   Will also start you on oral anti-inflammatory called PREDNISONE Day 1 and 2 - take 4 tablets with food  Day 3 and 4 - take 3 tablets with food  Day 5 and 6 - take 2 tablets with food  Day 7 through 9 - take 1 tablet with food until gone   OTC Medications:  Decongestants - helps relieve congestion   Flonase (generic fluticasone) or Nasacort (generic triamcinolone) - please make sure to use the "cross-over" technique at a 45 degree angle towards the opposite eye as opposed to straight up the nasal passageway.   Sudafed (generic pseudoephedrine - Note this is the one that is available behind the pharmacy counter); Products with phenylephrine (-PE) may also be used but is often not as effective as pseudoephedrine.   If you have HIGH BLOOD PRESSURE - Coricidin HBP; AVOID any product that is -D as this contains pseudoephedrine which may increase your blood pressure.  Afrin (oxymetazoline) every 6-8 hours for up to 3 days.   Allergies - helps relieve runny nose, itchy eyes and sneezing   Claritin (generic loratidine), Allegra (fexofenidine), or Zyrtec (generic cyrterizine) for runny nose. These medications should not cause drowsiness.  Note - Benadryl (generic diphenhydramine) may be used however may cause drowsiness  Cough -   Delsym or Robitussin (generic dextromethorphan)  Expectorants - helps loosen mucus to ease removal   Mucinex (generic guaifenesin) as directed on the package - good consistent use every day.   Headaches / General Aches   Tylenol (generic acetaminophen) - DO NOT EXCEED 3 grams (3,000 mg) in a 24 hour time period  Advil/Motrin (generic ibuprofen)   Sore Throat -   Salt water gargle   Chloraseptic  (generic benzocaine) spray or lozenges / Sucrets (generic dyclonine)   FOR YOUR BACK PAIN:  - the prednisone will help the pain with anti-inflammatory.  - over the counter salon pas patches, BioFreeze gel or Capsaicin Cream for your back pain to help.  - good supportive shoes/foot ware  - stretches    Acute Back Pain, Adult Acute back pain is sudden and usually short-lived. It is often caused by an injury to the muscles and tissues in the back. The injury may result from:  A muscle or ligament getting overstretched or torn (strained). Ligaments are tissues that connect bones to each other. Lifting something improperly can cause a back strain.  Wear and tear (degeneration) of the spinal disks. Spinal disks are circular tissue that provides cushioning between the bones of the spine (vertebrae).  Twisting motions, such as while playing sports or doing yard work.  A hit to the back.  Arthritis. You may have a physical exam, lab tests, and imaging tests to find the cause of your pain. Acute back pain usually goes away with rest and home care. Follow these instructions at home: Managing pain, stiffness, and swelling  Take over-the-counter and prescription medicines only as told by your health care provider.  Your health care provider may recommend applying ice during the first 24-48 hours after your pain starts. To do this: ? Put ice in a plastic bag. ? Place a towel between your skin and the bag. ? Leave the ice on for 20  minutes, 2-3 times a day.  If directed, apply heat to the affected area as often as told by your health care provider. Use the heat source that your health care provider recommends, such as a moist heat pack or a heating pad. ? Place a towel between your skin and the heat source. ? Leave the heat on for 20-30 minutes. ? Remove the heat if your skin turns bright red. This is especially important if you are unable to feel pain, heat, or cold. You have a greater risk of  getting burned. Activity   Do not stay in bed. Staying in bed for more than 1-2 days can delay your recovery.  Sit up and stand up straight. Avoid leaning forward when you sit, or hunching over when you stand. ? If you work at a desk, sit close to it so you do not need to lean over. Keep your chin tucked in. Keep your neck drawn back, and keep your elbows bent at a right angle. Your arms should look like the letter "L." ? Sit high and close to the steering wheel when you drive. Add lower back (lumbar) support to your car seat, if needed.  Take short walks on even surfaces as soon as you are able. Try to increase the length of time you walk each day.  Do not sit, drive, or stand in one place for more than 30 minutes at a time. Sitting or standing for long periods of time can put stress on your back.  Do not drive or use heavy machinery while taking prescription pain medicine.  Use proper lifting techniques. When you bend and lift, use positions that put less stress on your back: ? Pasatiempo your knees. ? Keep the load close to your body. ? Avoid twisting.  Exercise regularly as told by your health care provider. Exercising helps your back heal faster and helps prevent back injuries by keeping muscles strong and flexible.  Work with a physical therapist to make a safe exercise program, as recommended by your health care provider. Do any exercises as told by your physical therapist. Lifestyle  Maintain a healthy weight. Extra weight puts stress on your back and makes it difficult to have good posture.  Avoid activities or situations that make you feel anxious or stressed. Stress and anxiety increase muscle tension and can make back pain worse. Learn ways to manage anxiety and stress, such as through exercise. General instructions  Sleep on a firm mattress in a comfortable position. Try lying on your side with your knees slightly bent. If you lie on your back, put a pillow under your  knees.  Follow your treatment plan as told by your health care provider. This may include: ? Cognitive or behavioral therapy. ? Acupuncture or massage therapy. ? Meditation or yoga. Contact a health care provider if:  You have pain that is not relieved with rest or medicine.  You have increasing pain going down into your legs or buttocks.  Your pain does not improve after 2 weeks.  You have pain at night.  You lose weight without trying.  You have a fever or chills. Get help right away if:  You develop new bowel or bladder control problems.  You have unusual weakness or numbness in your arms or legs.  You develop nausea or vomiting.  You develop abdominal pain.  You feel faint. Summary  Acute back pain is sudden and usually short-lived.  Use proper lifting techniques. When you bend and lift,  use positions that put less stress on your back.  Take over-the-counter and prescription medicines and apply heat or ice as directed by your health care provider. This information is not intended to replace advice given to you by your health care provider. Make sure you discuss any questions you have with your health care provider. Document Released: 12/02/2005 Document Revised: 07/09/2018 Document Reviewed: 07/16/2017 Elsevier Interactive Patient Education  2019 [employer].

## 2019-01-06 ENCOUNTER — Encounter: Payer: Self-pay | Admitting: Internal Medicine

## 2019-01-06 ENCOUNTER — Ambulatory Visit (INDEPENDENT_AMBULATORY_CARE_PROVIDER_SITE_OTHER): Payer: Medicare Other | Admitting: Internal Medicine

## 2019-01-06 VITALS — BP 179/124 | HR 63 | Temp 97.6°F | Wt 163.0 lb

## 2019-01-06 DIAGNOSIS — I10 Essential (primary) hypertension: Secondary | ICD-10-CM | POA: Diagnosis not present

## 2019-01-06 DIAGNOSIS — B20 Human immunodeficiency virus [HIV] disease: Secondary | ICD-10-CM | POA: Diagnosis not present

## 2019-01-06 DIAGNOSIS — M79605 Pain in left leg: Secondary | ICD-10-CM

## 2019-01-06 DIAGNOSIS — M549 Dorsalgia, unspecified: Secondary | ICD-10-CM | POA: Diagnosis not present

## 2019-01-06 NOTE — Progress Notes (Signed)
RFV: follow up hiv disease, and illness  Patient ID: Abigail Medina, female   DOB: Nov 22, 1966, 53 y.o.   MRN: [id number]  HPI 53yo F with hiv disease, CD 4 count of 520/VL <20 (sep 2019) on tivicay/descovy She report getting flu shot then shortly thereafter having issues with respiratory Treated for asthma exacerbation and didn't tolerate steroids  Outpatient Encounter Medications as of 01/06/2019  Medication Sig  . albuterol (PROVENTIL HFA;VENTOLIN HFA) 108 (90 Base) MCG/ACT inhaler Inhale 2 puffs into the lungs every 4 (four) hours as needed for wheezing or shortness of breath (cough, shortness of breath or wheezing.).  Marland Kitchen aspirin 81 MG tablet Take 81 mg by mouth daily.  . cetirizine (ZYRTEC) 10 MG tablet Take 1 tablet (10 mg total) by mouth daily.  . cimetidine (TAGAMET) 200 MG tablet Take 1 tablet (200 mg total) by mouth 2 (two) times daily.  . dolutegravir (TIVICAY) 50 MG tablet TAKE 1 TABLET(50 MG) BY MOUTH DAILY  . emtricitabine-tenofovir AF (DESCOVY) 200-25 MG tablet Take 1 tablet by mouth daily.  . fluticasone (FLONASE) 50 MCG/ACT nasal spray Place 2 sprays into both nostrils daily.  Marland Kitchen gabapentin (NEURONTIN) 400 MG capsule Take 1 capsule (400 mg total) by mouth 3 (three) times daily. Start taking 1 tab daily x 7d, then twice a day x 7d, then can do TID  . hydrochlorothiazide (HYDRODIURIL) 25 MG tablet Take 1 tablet (25 mg total) by mouth daily.  . metoprolol succinate (TOPROL-XL) 100 MG 24 hr tablet Take 1 tablet (100 mg total) by mouth daily. Take with or immediately following a meal.  . PARoxetine (PAXIL) 30 MG tablet Take 1 tablet (30 mg total) by mouth daily.  . traZODone (DESYREL) 50 MG tablet TAKE 0.5 TABLETS (25 MG TOTAL) BY MOUTH AT BEDTIME AS NEEDED FOR SLEEP.  Marland Kitchen predniSONE (DELTASONE) 10 MG tablet Take 1 tablet (10 mg total) by mouth daily with breakfast. (Patient not taking: Reported on 01/06/2019)  . Vitamin D, Ergocalciferol, (DRISDOL) 50000 units CAPS capsule TAKE ONE  CAPSULE BY MOUTH ONE TIME PER WEEK (Patient not taking: Reported on 01/06/2019)   No facility-administered encounter medications on file as of 01/06/2019.      Patient Active Problem List   Diagnosis Date Noted  . Pain of back and left lower extremity 12/15/2018  . Upper respiratory infection, viral 12/15/2018  . 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 Tobacco dependence 06/02/2015  . Numbness and tingling of right arm 05/30/2015  . Right arm weakness 05/30/2015  . Arm numbness left 10/10/2014  . Vitamin D deficiency 08/05/2014  . Allergic rhinitis 05/25/2013  . 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 Polypharmacy 08/20/2012  . HIV (human immunodeficiency virus infection) ([place]) 08/20/2012  . HLD (hyperlipidemia) 07/15/2012  . BP (high blood pressure) 07/15/2012  . Cerebrovascular accident, late effects 01/30/2012  . Depression with anxiety 07/15/2011  . Seborrhea capitis 04/18/2011  . Seborrheic dermatitis 03/07/2011  . Hemiplegia, late effect of cerebrovascular disease ([place]) 01/25/2011  . VAGINAL DISCHARGE 12/26/2010  . NEVUS 08/29/2010  . AMENORRHEA 07/19/2010  . SKIN RASH 05/21/2010  . THRUSH 12/28/2009  . Human immunodeficiency virus (HIV) disease ([place]) 08/06/2007  . Essential hypertension 08/06/2007     Health Maintenance Due  Topic Date Due  . COLONOSCOPY  03/02/2014  . PAP SMEAR-Modifier  08/19/2017  . INFLUENZA VACCINE  07/16/2018     Review of Systems 12 point ros is otherwise negative except for back pain Physical Exam   BP (!) 179/124   Pulse 63  Temp 97.6 F (36.4 C) (Oral)   Wt 163 lb (73.9 kg)   LMP 08/04/2014   BMI 28.87 kg/m   Physical Exam  Constitutional:  oriented to person, place, and time. appears well-developed and well-nourished. No distress.  HENT: [place]/AT, PERRLA, no scleral icterus Mouth/Throat: Oropharynx is clear and moist. No oropharyngeal exudate.  Cardiovascular: Normal rate, regular rhythm and normal heart sounds. Exam reveals no gallop and  no friction rub.  No murmur heard.  Pulmonary/Chest: Effort normal and breath sounds normal. No respiratory distress.  has no wheezes.  Neck = supple, no nuchal rigidity Back : firm paraspinal muscles  Neurological: alert and oriented to person, place, and time.  Skin: Skin is warm and dry. No rash noted. No erythema.  Psychiatric: a normal mood and affect.  behavior is normal.   Lab Results  Component Value Date   CD4TCELL 35 09/07/2018   Lab Results  Component Value Date   CD4TABS 520 09/07/2018   CD4TABS 520 01/23/2017   CD4TABS 540 02/13/2016   Lab Results  Component Value Date   HIV1RNAQUANT <20 NOT DETECTED 09/07/2018   Lab Results  Component Value Date   HEPBSAB NEG 03/10/2014   Lab Results  Component Value Date   LABRPR NON-REACTIVE 09/07/2018    CBC Lab Results  Component Value Date   WBC 3.0 (L) 09/07/2018   RBC 4.90 09/07/2018   HGB 15.5 09/07/2018   HCT 43.7 09/07/2018   PLT 185 09/07/2018   MCV 89.2 09/07/2018   MCH 31.6 09/07/2018   MCHC 35.5 09/07/2018   RDW 12.4 09/07/2018   LYMPHSABS 1,416 09/07/2018   MONOABS 352 01/23/2017   EOSABS 150 09/07/2018    BMET Lab Results  Component Value Date   NA 138 09/07/2018   K 3.4 (L) 09/07/2018   CL 102 09/07/2018   CO2 29 09/07/2018   GLUCOSE 96 09/07/2018   BUN 12 09/07/2018   CREATININE 0.77 09/07/2018   CALCIUM 9.2 09/07/2018   GFRNONAA 88 09/07/2018   GFRAA 101 09/07/2018      Assessment and Plan  Essentially htn, poorly controlled = has not taken meds this am. Will have her come back nurse visit if BP still high to change meds  hiv disease = will check labs  Partner testing  msk = right paraspinal muscle is tight. Likely needs topical ointment massage to work out

## 2019-01-07 LAB — T-HELPER CELL (CD4) - (RCID CLINIC ONLY)
CD4 T CELL ABS: 400 /uL (ref 400–2700)
CD4 T CELL HELPER: 33 % (ref 33–55)

## 2019-01-08 LAB — CBC WITH DIFFERENTIAL/PLATELET
ABSOLUTE MONOCYTES: 238 {cells}/uL (ref 200–950)
Basophils Absolute: 11 cells/uL (ref 0–200)
Basophils Relative: 0.4 %
Eosinophils Absolute: 138 cells/uL (ref 15–500)
Eosinophils Relative: 5.1 %
HCT: 43.8 % (ref 35.0–45.0)
Hemoglobin: 14.9 g/dL (ref 11.7–15.5)
Lymphs Abs: 1142 cells/uL (ref 850–3900)
MCH: 30.6 pg (ref 27.0–33.0)
MCHC: 34 g/dL (ref 32.0–36.0)
MCV: 89.9 fL (ref 80.0–100.0)
MPV: 11.2 fL (ref 7.5–12.5)
Monocytes Relative: 8.8 %
Neutro Abs: 1172 cells/uL — ABNORMAL LOW (ref 1500–7800)
Neutrophils Relative %: 43.4 %
Platelets: 157 10*3/uL (ref 140–400)
RBC: 4.87 10*6/uL (ref 3.80–5.10)
RDW: 12.3 % (ref 11.0–15.0)
Total Lymphocyte: 42.3 %
WBC: 2.7 10*3/uL — ABNORMAL LOW (ref 3.8–10.8)

## 2019-01-08 LAB — COMPLETE METABOLIC PANEL WITH GFR
AG Ratio: 1.4 (calc) (ref 1.0–2.5)
ALT: 17 U/L (ref 6–29)
AST: 17 U/L (ref 10–35)
Albumin: 3.8 g/dL (ref 3.6–5.1)
Alkaline phosphatase (APISO): 68 U/L (ref 33–130)
BUN: 9 mg/dL (ref 7–25)
CO2: 30 mmol/L (ref 20–32)
Calcium: 8.8 mg/dL (ref 8.6–10.4)
Chloride: 107 mmol/L (ref 98–110)
Creat: 0.91 mg/dL (ref 0.50–1.05)
GFR, EST NON AFRICAN AMERICAN: 72 mL/min/{1.73_m2} (ref >=60)
GFR, Est African American: 83 mL/min/{1.73_m2} (ref >=60)
Globulin: 2.8 g/dL (calc) (ref 1.9–3.7)
Glucose, Bld: 88 mg/dL (ref 65–99)
POTASSIUM: 3.6 mmol/L (ref 3.5–5.3)
Sodium: 143 mmol/L (ref 135–146)
Total Bilirubin: 1.2 mg/dL (ref 0.2–1.2)
Total Protein: 6.6 g/dL (ref 6.1–8.1)

## 2019-01-08 LAB — HIV-1 RNA QUANT-NO REFLEX-BLD
HIV 1 RNA Quant: <20 copies/mL
HIV-1 RNA Quant, Log: <1.3 Log copies/mL

## 2019-01-08 LAB — RPR: RPR Ser Ql: NONREACTIVE

## 2019-01-14 ENCOUNTER — Ambulatory Visit (INDEPENDENT_AMBULATORY_CARE_PROVIDER_SITE_OTHER): Payer: Medicare Other | Admitting: Family Medicine

## 2019-01-14 ENCOUNTER — Encounter: Payer: Self-pay | Admitting: Family Medicine

## 2019-01-14 VITALS — BP 140/88 | HR 66 | Temp 98.0°F | Resp 14 | Ht 63.0 in | Wt 160.0 lb

## 2019-01-14 DIAGNOSIS — G629 Polyneuropathy, unspecified: Secondary | ICD-10-CM | POA: Diagnosis not present

## 2019-01-14 DIAGNOSIS — E538 Deficiency of other specified B group vitamins: Secondary | ICD-10-CM | POA: Diagnosis not present

## 2019-01-14 DIAGNOSIS — Z1211 Encounter for screening for malignant neoplasm of colon: Secondary | ICD-10-CM | POA: Diagnosis not present

## 2019-01-14 DIAGNOSIS — I10 Essential (primary) hypertension: Secondary | ICD-10-CM | POA: Diagnosis not present

## 2019-01-14 DIAGNOSIS — E559 Vitamin D deficiency, unspecified: Secondary | ICD-10-CM | POA: Diagnosis not present

## 2019-01-14 LAB — POCT URINALYSIS DIPSTICK
Bilirubin, UA: NEGATIVE
Blood, UA: NEGATIVE
Glucose, UA: NEGATIVE
Ketones, UA: NEGATIVE
Nitrite, UA: NEGATIVE
Protein, UA: POSITIVE — AB
Spec Grav, UA: 1.02 (ref 1.010–1.025)
Urobilinogen, UA: 2 E.U./dL — AB
pH, UA: 7 (ref 5.0–8.0)

## 2019-01-14 MED ORDER — VITAMIN D (ERGOCALCIFEROL) 1.25 MG (50000 UNIT) PO CAPS: ORAL_CAPSULE | ORAL | 1 refills | Status: DC

## 2019-01-14 NOTE — Patient Instructions (Addendum)
You have fluid in your ears. Please continue with your flonase and start zyrtec every day. This can be the cause of the dizziness you have when you bend over.  I will see you again in 3 months for your blood pressure.  I also referred you for a colonoscopy. You will also need a pap smear soon.   Eustachian Tube Dysfunction  Eustachian tube dysfunction refers to a condition in which a blockage develops in the narrow passage that connects the middle ear to the back of the nose (eustachian tube). The eustachian tube regulates air pressure in the middle ear by letting air move between the ear and nose. It also helps to drain fluid from the middle ear space. Eustachian tube dysfunction can affect one or both ears. When the eustachian tube does not function properly, air pressure, fluid, or both can build up in the middle ear. What are the causes? This condition occurs when the eustachian tube becomes blocked or cannot open normally. Common causes of this condition include:  Ear infections.  Colds and other infections that affect the nose, mouth, and throat (upper respiratory tract).  Allergies.  Irritation from cigarette smoke.  Irritation from stomach acid coming up into the esophagus (gastroesophageal reflux). The esophagus is the tube that carries food from the mouth to the stomach.  Sudden changes in air pressure, such as from descending in an airplane or scuba diving.  Abnormal growths in the nose or throat, such as: ? Growths that line the nose (nasal polyps). ? Abnormal growth of cells (tumors). ? Enlarged tissue at the back of the throat (adenoids). What increases the risk? You are more likely to develop this condition if:  You smoke.  You are overweight.  You are a child who has: ? Certain birth defects of the mouth, such as cleft palate. ? Large tonsils or adenoids. What are the signs or symptoms? Common symptoms of this condition include:  A feeling of fullness in the  ear.  Ear pain.  Clicking or popping noises in the ear.  Ringing in the ear.  Hearing loss.  Loss of balance.  Dizziness. Symptoms may get worse when the air pressure around you changes, such as when you travel to an area of high elevation, fly on an airplane, or go scuba diving. How is this diagnosed? This condition may be diagnosed based on:  Your symptoms.  A physical exam of your ears, nose, and throat.  Tests, such as those that measure: ? The movement of your eardrum (tympanogram). ? Your hearing (audiometry). How is this treated? Treatment depends on the cause and severity of your condition.  In mild cases, you may relieve your symptoms by moving air into your ears. This is called "popping the ears."  In more severe cases, or if you have symptoms of fluid in your ears, treatment may include: ? Medicines to relieve congestion (decongestants). ? Medicines that treat allergies (antihistamines). ? Nasal sprays or ear drops that contain medicines that reduce swelling (steroids). ? A procedure to drain the fluid in your eardrum (myringotomy). In this procedure, a small tube is placed in the eardrum to:  Drain the fluid.  Restore the air in the middle ear space. ? A procedure to insert a balloon device through the nose to inflate the opening of the eustachian tube (balloon dilation). Follow these instructions at home: Lifestyle  Do not do any of the following until your health care provider approves: ? Travel to high altitudes. ? Fly  in airplanes. ? Work in a Pension scheme manager or room. ? Scuba dive.  Do not use any products that contain nicotine or tobacco, such as cigarettes and e-cigarettes. If you need help quitting, ask your health care provider.  Keep your ears dry. Wear fitted earplugs during showering and bathing. Dry your ears completely after. General instructions  Take over-the-counter and prescription medicines only as told by your health care  provider.  Use techniques to help pop your ears as recommended by your health care provider. These may include: ? Chewing gum. ? Yawning. ? Frequent, forceful swallowing. ? Closing your mouth, holding your nose closed, and gently blowing as if you are trying to blow air out of your nose.  Keep all follow-up visits as told by your health care provider. This is important. Contact a health care provider if:  Your symptoms do not go away after treatment.  Your symptoms come back after treatment.  You are unable to pop your ears.  You have: ? A fever. ? Pain in your ear. ? Pain in your head or neck. ? Fluid draining from your ear.  Your hearing suddenly changes.  You become very dizzy.  You lose your balance. Summary  Eustachian tube dysfunction refers to a condition in which a blockage develops in the eustachian tube.  It can be caused by ear infections, allergies, inhaled irritants, or abnormal growths in the nose or throat.  Symptoms include ear pain, hearing loss, or ringing in the ears.  Mild cases are treated with maneuvers to unblock the ears, such as yawning or ear popping.  Severe cases are treated with medicines. Surgery may also be done (rare). This information is not intended to replace advice given to you by your health care provider. Make sure you discuss any questions you have with your health care provider. Document Released: 12/29/2015 Document Revised: 03/24/2018 Document Reviewed: 03/24/2018 Elsevier Interactive Patient Education  2019 [employer].

## 2019-01-14 NOTE — Progress Notes (Signed)
[facility]   Progress Note: General Provider: Lanae Boast, FNP  SUBJECTIVE:   Abigail Medina is a 53 y.o. female who  has a past medical history of HIV (human immunodeficiency virus infection) ([place]), Hypertension, Seizures ([place]), Stroke ([place]), and Vision abnormalities.. Patient presents today for Hypertension and Extremity Weakness (in legs/knees ) Patient has been lost to PCP since 04/2017. Patient followed by ID Dr. Baxter Flattery. She states that she has dizziness with bending over. Also ringing in the ears. Patient states that she is starting to consistently take her medications. She does not eat often and forgets to take them.  Hx of vitamin deficiencies.  Review of Systems  Constitutional: Negative.   HENT: Positive for congestion and tinnitus.   Eyes: Negative.   Respiratory: Negative.   Cardiovascular: Negative.   Gastrointestinal: Negative.   Genitourinary: Negative.   Musculoskeletal: Positive for myalgias (lower extremities).  Skin: Negative.   Neurological: Positive for weakness.  Psychiatric/Behavioral: Negative.      OBJECTIVE: BP 140/88 Comment: manually  Pulse 66   Temp 98 F (36.7 C) (Oral)   Resp 14   Ht 5\' 3"  (1.6 m)   Wt 160 lb (72.6 kg)   LMP 08/04/2014   SpO2 100%   BMI 28.34 kg/m   Wt Readings from Last 3 Encounters:  01/14/19 160 lb (72.6 kg)  01/06/19 163 lb (73.9 kg)  12/15/18 163 lb (73.9 kg)     Physical Exam Vitals signs and nursing note reviewed.  Constitutional:      General: She is not in acute distress.    Appearance: She is well-developed.  HENT:     Head: Normocephalic and atraumatic.     Right Ear: A middle ear effusion is present. Tympanic membrane is not erythematous.     Left Ear: A middle ear effusion is present. Tympanic membrane is not erythematous.  Eyes:     Conjunctiva/sclera: Conjunctivae normal.     Pupils: Pupils are equal, round, and reactive to light.  Neck:   Musculoskeletal: Normal range of motion.  Cardiovascular:     Rate and Rhythm: Normal rate and regular rhythm.     Heart sounds: Normal heart sounds.  Pulmonary:     Effort: Pulmonary effort is normal. No respiratory distress.     Breath sounds: Normal breath sounds.  Abdominal:     General: Bowel sounds are normal. There is no distension.     Palpations: Abdomen is soft.  Musculoskeletal: Normal range of motion.  Skin:    General: Skin is warm and dry.  Neurological:     Mental Status: She is alert and oriented to person, place, and time.  Psychiatric:        Behavior: Behavior normal.        Thought Content: Thought content normal.     ASSESSMENT/PLAN:  1. Essential hypertension Continue current medications as previously prescribed.  Reiterated the importance of taking medications consistently.  Also recommend low-sodium diet and increased exercise. - Urinalysis Dipstick  2. Vitamin D deficiency We will repeat labs today. - Vitamin D, Ergocalciferol, (DRISDOL) 1.25 MG (50000 UT) CAPS capsule; TAKE ONE CAPSULE BY MOUTH ONE TIME PER WEEK  Dispense: 8 capsule; Refill: 1 - Vitamin D, 25-hydroxy - Vitamin B12  3. Neuropathy Patient reports taking 400 mg of gabapentin daily.  Instructed patient to increase this to twice a day for a total of 800 mg a day. - Vitamin D, 25-hydroxy - Vitamin B12  4.  Vitamin B 12 deficiency We will check levels today. - Vitamin B12  5. Screen for colon cancer Referral placed. - Ambulatory referral to Gastroenterology   Eustachian tube: Advised patient to consistently take Zyrtec daily.  Continue with Flonase.  Return to care if symptoms persist.  Return in about 3 months (around 04/15/2019) for htn- also needs pap.    The patient was given clear instructions to go to ER or return to medical center if symptoms do not improve, worsen or new problems develop. The patient verbalized understanding and agreed with plan of care.   [name].  [name], FNP-BC [facility] [address] [place], [place]  [address] [phone]

## 2019-01-15 LAB — VITAMIN B12: Vitamin B-12: 326 pg/mL (ref 232–1245)

## 2019-01-15 LAB — VITAMIN D 25 HYDROXY (VIT D DEFICIENCY, FRACTURES): Vit D, 25-Hydroxy: 24.8 ng/mL — ABNORMAL LOW (ref 30.0–100.0)

## 2019-01-27 ENCOUNTER — Other Ambulatory Visit: Payer: Self-pay | Admitting: Behavioral Health

## 2019-01-27 DIAGNOSIS — B20 Human immunodeficiency virus [HIV] disease: Secondary | ICD-10-CM

## 2019-01-27 MED ORDER — EMTRICITABINE-TENOFOVIR AF 200-25 MG PO TABS: 1.0000 | ORAL_TABLET | Freq: Every day | ORAL | 3 refills | Status: DC

## 2019-01-27 MED ORDER — DOLUTEGRAVIR SODIUM 50 MG PO TABS: ORAL_TABLET | ORAL | 3 refills | Status: DC

## 2019-02-14 ENCOUNTER — Other Ambulatory Visit: Payer: Self-pay | Admitting: Internal Medicine

## 2019-02-14 DIAGNOSIS — F411 Generalized anxiety disorder: Secondary | ICD-10-CM

## 2019-03-23 ENCOUNTER — Other Ambulatory Visit: Payer: Self-pay | Admitting: Infectious Diseases

## 2019-03-27 ENCOUNTER — Observation Stay (HOSPITAL_COMMUNITY): Payer: Medicare Other

## 2019-03-27 ENCOUNTER — Inpatient Hospital Stay (HOSPITAL_COMMUNITY)
Admission: EM | Admit: 2019-03-27 | Discharge: 2019-03-29 | DRG: 040 | Disposition: A | Discharge status: Home Health | Payer: Medicare Other | Attending: Internal Medicine | Admitting: Internal Medicine

## 2019-03-27 ENCOUNTER — Emergency Department (HOSPITAL_COMMUNITY): Payer: Medicare Other

## 2019-03-27 ENCOUNTER — Other Ambulatory Visit: Payer: Self-pay

## 2019-03-27 ENCOUNTER — Encounter (HOSPITAL_COMMUNITY): Payer: Self-pay | Admitting: Emergency Medicine

## 2019-03-27 DIAGNOSIS — E538 Deficiency of other specified B group vitamins: Secondary | ICD-10-CM | POA: Diagnosis present

## 2019-03-27 DIAGNOSIS — G9389 Other specified disorders of brain: Secondary | ICD-10-CM | POA: Diagnosis not present

## 2019-03-27 DIAGNOSIS — I672 Cerebral atherosclerosis: Secondary | ICD-10-CM | POA: Diagnosis not present

## 2019-03-27 DIAGNOSIS — E876 Hypokalemia: Secondary | ICD-10-CM | POA: Diagnosis not present

## 2019-03-27 DIAGNOSIS — Z79899 Other long term (current) drug therapy: Secondary | ICD-10-CM | POA: Diagnosis not present

## 2019-03-27 DIAGNOSIS — I10 Essential (primary) hypertension: Secondary | ICD-10-CM | POA: Diagnosis present

## 2019-03-27 DIAGNOSIS — I6389 Other cerebral infarction: Secondary | ICD-10-CM | POA: Diagnosis not present

## 2019-03-27 DIAGNOSIS — I700 Atherosclerosis of aorta: Secondary | ICD-10-CM | POA: Diagnosis present

## 2019-03-27 DIAGNOSIS — Z7982 Long term (current) use of aspirin: Secondary | ICD-10-CM | POA: Diagnosis not present

## 2019-03-27 DIAGNOSIS — I6930 Unspecified sequelae of cerebral infarction: Secondary | ICD-10-CM | POA: Diagnosis not present

## 2019-03-27 DIAGNOSIS — I639 Cerebral infarction, unspecified: Secondary | ICD-10-CM | POA: Diagnosis present

## 2019-03-27 DIAGNOSIS — I63132 Cerebral infarction due to embolism of left carotid artery: Secondary | ICD-10-CM | POA: Diagnosis not present

## 2019-03-27 DIAGNOSIS — E785 Hyperlipidemia, unspecified: Secondary | ICD-10-CM | POA: Diagnosis not present

## 2019-03-27 DIAGNOSIS — I63412 Cerebral infarction due to embolism of left middle cerebral artery: Secondary | ICD-10-CM | POA: Diagnosis not present

## 2019-03-27 DIAGNOSIS — Q283 Other malformations of cerebral vessels: Secondary | ICD-10-CM

## 2019-03-27 DIAGNOSIS — R29702 NIHSS score 2: Secondary | ICD-10-CM | POA: Diagnosis present

## 2019-03-27 DIAGNOSIS — Z87891 Personal history of nicotine dependence: Secondary | ICD-10-CM

## 2019-03-27 DIAGNOSIS — R531 Weakness: Secondary | ICD-10-CM

## 2019-03-27 DIAGNOSIS — I712 Thoracic aortic aneurysm, without rupture, unspecified: Secondary | ICD-10-CM | POA: Diagnosis present

## 2019-03-27 DIAGNOSIS — F418 Other specified anxiety disorders: Secondary | ICD-10-CM | POA: Diagnosis not present

## 2019-03-27 DIAGNOSIS — Z7951 Long term (current) use of inhaled steroids: Secondary | ICD-10-CM

## 2019-03-27 DIAGNOSIS — B20 Human immunodeficiency virus [HIV] disease: Secondary | ICD-10-CM | POA: Diagnosis not present

## 2019-03-27 DIAGNOSIS — R17 Unspecified jaundice: Secondary | ICD-10-CM | POA: Diagnosis present

## 2019-03-27 DIAGNOSIS — I611 Nontraumatic intracerebral hemorrhage in hemisphere, cortical: Secondary | ICD-10-CM | POA: Diagnosis not present

## 2019-03-27 DIAGNOSIS — I69359 Hemiplegia and hemiparesis following cerebral infarction affecting unspecified side: Secondary | ICD-10-CM | POA: Diagnosis not present

## 2019-03-27 DIAGNOSIS — I69959 Hemiplegia and hemiparesis following unspecified cerebrovascular disease affecting unspecified side: Secondary | ICD-10-CM

## 2019-03-27 DIAGNOSIS — R29818 Other symptoms and signs involving the nervous system: Secondary | ICD-10-CM | POA: Diagnosis not present

## 2019-03-27 DIAGNOSIS — Z8249 Family history of ischemic heart disease and other diseases of the circulatory system: Secondary | ICD-10-CM | POA: Diagnosis not present

## 2019-03-27 DIAGNOSIS — D72819 Decreased white blood cell count, unspecified: Secondary | ICD-10-CM | POA: Diagnosis not present

## 2019-03-27 DIAGNOSIS — Z888 Allergy status to other drugs, medicaments and biological substances status: Secondary | ICD-10-CM

## 2019-03-27 DIAGNOSIS — I6330 Cerebral infarction due to thrombosis of unspecified cerebral artery: Secondary | ICD-10-CM | POA: Diagnosis present

## 2019-03-27 DIAGNOSIS — M6281 Muscle weakness (generalized): Secondary | ICD-10-CM | POA: Diagnosis not present

## 2019-03-27 LAB — CBC
HCT: 47.5 % — ABNORMAL HIGH (ref 36.0–46.0)
Hemoglobin: 15.8 g/dL — ABNORMAL HIGH (ref 12.0–15.0)
MCH: 30.8 pg (ref 26.0–34.0)
MCHC: 33.3 g/dL (ref 30.0–36.0)
MCV: 92.6 fL (ref 80.0–100.0)
Platelets: 177 10*3/uL (ref 150–400)
RBC: 5.13 MIL/uL — ABNORMAL HIGH (ref 3.87–5.11)
RDW: 12.3 % (ref 11.5–15.5)
WBC: 3.9 10*3/uL — ABNORMAL LOW (ref 4.0–10.5)
nRBC: 0 % (ref 0.0–0.2)

## 2019-03-27 LAB — COMPREHENSIVE METABOLIC PANEL
ALT: 23 U/L (ref 0–44)
AST: 25 U/L (ref 15–41)
Albumin: 4 g/dL (ref 3.5–5.0)
Alkaline Phosphatase: 77 U/L (ref 38–126)
Anion gap: 13 (ref 5–15)
BUN: 10 mg/dL (ref 6–20)
CO2: 26 mmol/L (ref 22–32)
Calcium: 9.6 mg/dL (ref 8.9–10.3)
Chloride: 101 mmol/L (ref 98–111)
Creatinine, Ser: 0.95 mg/dL (ref 0.44–1.00)
GFR calc Af Amer: >60 mL/min (ref >60)
GFR calc non Af Amer: >60 mL/min (ref >60)
Glucose, Bld: 87 mg/dL (ref 70–99)
Potassium: 3.3 mmol/L — ABNORMAL LOW (ref 3.5–5.1)
Sodium: 140 mmol/L (ref 135–145)
Total Bilirubin: 2 mg/dL — ABNORMAL HIGH (ref 0.3–1.2)
Total Protein: 7.4 g/dL (ref 6.5–8.1)

## 2019-03-27 LAB — DIFFERENTIAL
Abs Immature Granulocytes: 0.01 10*3/uL (ref 0.00–0.07)
Basophils Absolute: 0 10*3/uL (ref 0.0–0.1)
Basophils Relative: 0 %
Eosinophils Absolute: 0.2 10*3/uL (ref 0.0–0.5)
Eosinophils Relative: 4 %
Immature Granulocytes: 0 %
Lymphocytes Relative: 48 %
Lymphs Abs: 1.8 10*3/uL (ref 0.7–4.0)
Monocytes Absolute: 0.4 10*3/uL (ref 0.1–1.0)
Monocytes Relative: 9 %
Neutro Abs: 1.5 10*3/uL — ABNORMAL LOW (ref 1.7–7.7)
Neutrophils Relative %: 39 %

## 2019-03-27 LAB — PROTIME-INR
INR: 0.9 (ref 0.8–1.2)
Prothrombin Time: 12.4 seconds (ref 11.4–15.2)

## 2019-03-27 LAB — I-STAT BETA HCG BLOOD, ED (MC, WL, AP ONLY): I-stat hCG, quantitative: <5 m[IU]/mL (ref <5)

## 2019-03-27 LAB — I-STAT CREATININE, ED: Creatinine, Ser: 1 mg/dL (ref 0.44–1.00)

## 2019-03-27 LAB — APTT: aPTT: 26 seconds (ref 24–36)

## 2019-03-27 LAB — CBG MONITORING, ED: Glucose-Capillary: 97 mg/dL (ref 70–99)

## 2019-03-27 MED ORDER — PAROXETINE HCL 30 MG PO TABS
30.0000 mg | ORAL_TABLET | Freq: Every day | ORAL | Status: DC
  Administered 2019-03-28 – 2019-03-29 (×2): 30 mg via ORAL
  Filled 2019-03-27 (×2): qty 1

## 2019-03-27 MED ORDER — ACETAMINOPHEN 650 MG RE SUPP: 650.0000 mg | RECTAL | Status: DC | PRN

## 2019-03-27 MED ORDER — POTASSIUM CHLORIDE CRYS ER 20 MEQ PO TBCR
40.0000 meq | EXTENDED_RELEASE_TABLET | Freq: Once | ORAL | Status: AC
  Administered 2019-03-27: 23:00:00 40 meq via ORAL
  Filled 2019-03-27: qty 2

## 2019-03-27 MED ORDER — ASPIRIN 300 MG RE SUPP: 300.0000 mg | Freq: Every day | RECTAL | Status: DC

## 2019-03-27 MED ORDER — ENOXAPARIN SODIUM 40 MG/0.4ML ~~LOC~~ SOLN
40.0000 mg | SUBCUTANEOUS | Status: DC
  Administered 2019-03-27 – 2019-03-28 (×2): 40 mg via SUBCUTANEOUS
  Filled 2019-03-27 (×2): qty 0.4

## 2019-03-27 MED ORDER — SENNOSIDES-DOCUSATE SODIUM 8.6-50 MG PO TABS: 1.0000 | ORAL_TABLET | Freq: Every evening | ORAL | Status: DC | PRN

## 2019-03-27 MED ORDER — ASPIRIN 325 MG PO TABS
325.0000 mg | ORAL_TABLET | Freq: Every day | ORAL | Status: DC
  Administered 2019-03-28 – 2019-03-29 (×2): 325 mg via ORAL
  Filled 2019-03-27 (×2): qty 1

## 2019-03-27 MED ORDER — SODIUM CHLORIDE 0.9 % IV SOLN
INTRAVENOUS | Status: DC
  Administered 2019-03-27: 22:00:00 via INTRAVENOUS

## 2019-03-27 MED ORDER — ALBUTEROL SULFATE (2.5 MG/3ML) 0.083% IN NEBU: 3.0000 mL | INHALATION_SOLUTION | RESPIRATORY_TRACT | Status: DC | PRN

## 2019-03-27 MED ORDER — PAROXETINE HCL 30 MG PO TABS: 30.0000 mg | ORAL_TABLET | Freq: Every day | ORAL | Status: DC

## 2019-03-27 MED ORDER — LABETALOL HCL 5 MG/ML IV SOLN: 10.0000 mg | INTRAVENOUS | Status: DC | PRN

## 2019-03-27 MED ORDER — STROKE: EARLY STAGES OF RECOVERY BOOK
Freq: Once | Status: AC
  Administered 2019-03-27: 22:00:00

## 2019-03-27 MED ORDER — SODIUM CHLORIDE 0.9% FLUSH: 3.0000 mL | Freq: Once | INTRAVENOUS | Status: DC

## 2019-03-27 MED ORDER — ACETAMINOPHEN 325 MG PO TABS: 650.0000 mg | ORAL_TABLET | ORAL | Status: DC | PRN

## 2019-03-27 MED ORDER — ACETAMINOPHEN 160 MG/5ML PO SOLN: 650.0000 mg | ORAL | Status: DC | PRN

## 2019-03-27 MED ORDER — EMTRICITABINE-TENOFOVIR AF 200-25 MG PO TABS
1.0000 | ORAL_TABLET | Freq: Every day | ORAL | Status: DC
  Administered 2019-03-28 – 2019-03-29 (×2): 1 via ORAL
  Filled 2019-03-27 (×2): qty 1

## 2019-03-27 MED ORDER — LORAZEPAM 2 MG/ML IJ SOLN
1.0000 mg | Freq: Once | INTRAMUSCULAR | Status: AC
  Administered 2019-03-28: 1 mg via INTRAVENOUS
  Filled 2019-03-27 (×2): qty 1

## 2019-03-27 MED ORDER — TRAZODONE HCL 50 MG PO TABS: 25.0000 mg | ORAL_TABLET | Freq: Every evening | ORAL | Status: DC | PRN

## 2019-03-27 MED ORDER — DOLUTEGRAVIR SODIUM 50 MG PO TABS
50.0000 mg | ORAL_TABLET | Freq: Every day | ORAL | Status: DC
  Administered 2019-03-28 – 2019-03-29 (×2): 50 mg via ORAL
  Filled 2019-03-27 (×2): qty 1

## 2019-03-27 MED ORDER — ASPIRIN 81 MG PO CHEW
324.0000 mg | CHEWABLE_TABLET | Freq: Once | ORAL | Status: AC
  Administered 2019-03-27: 23:00:00 324 mg via ORAL
  Filled 2019-03-27: qty 4

## 2019-03-27 MED ORDER — IOHEXOL 350 MG/ML SOLN
75.0000 mL | Freq: Once | INTRAVENOUS | Status: AC | PRN
  Administered 2019-03-27: 75 mL via INTRAVENOUS

## 2019-03-27 MED ORDER — GABAPENTIN 400 MG PO CAPS
400.0000 mg | ORAL_CAPSULE | Freq: Three times a day (TID) | ORAL | Status: DC
  Administered 2019-03-28 – 2019-03-29 (×5): 400 mg via ORAL
  Filled 2019-03-27 (×5): qty 1

## 2019-03-27 NOTE — ED Notes (Signed)
Patient transported to CT 

## 2019-03-27 NOTE — ED Notes (Signed)
Pt transported to MRI. Dr Malen Gauze met pt there. No need for emergent MRI per Neuro, pt will get MRI with medication prior from the floor.

## 2019-03-27 NOTE — ED Notes (Signed)
ED TO INPATIENT HANDOFF REPORT  ED Nurse Name and Phone #: [phone] Magda Kiel Name/Age/Gender Abigail Medina 53 y.o. female Room/Bed: 028C/028C  Code Status   Code Status: Full Code  Home/SNF/Other Home Patient oriented to: self, place, time and situation Is this baseline? Yes   Triage Complete: Triage complete  Chief Complaint SX symptoms  Triage Note Pt started having right side weakness at 1730. Pt came POV to ED. Ao x 4. Drift and ataxia to right arm present. Hx of stroke effecting left side with no residual. NIH 2 per Neurology.    Allergies Allergies  Allergen Reactions  . Lisinopril Swelling    Level of Care/Admitting Diagnosis ED Disposition    ED Disposition Condition [facility] Area: [place] [[id number]]  Level of Care: Telemetry Medical [104]  I expect the patient will be discharged within 24 hours: Yes  LOW acuity---Tx typically complete <24 hrs---ACUTE conditions typically can be evaluated <24 hours---LABS likely to return to acceptable levels <24 hours---IS near functional baseline---EXPECTED to return to current living arrangement---NOT newly hypoxic: Does not meet criteria for 5C-Observation unit  Diagnosis: Acute focal neurological deficit [730320]  Admitting Physician: Vianne Bulls [[id number]]  Attending Physician: Vianne Bulls [[id number]]  PT Class (Do Not Modify): Observation [104]  PT Acc Code (Do Not Modify): Observation [10022]       B Medical/Surgery History Past Medical History:  Diagnosis Date  . HIV (human immunodeficiency virus infection) ([place])   . Hypertension   . Seizures ([place])   . Stroke ([place])   . Vision abnormalities    History reviewed. No pertinent surgical history.   A IV Location/Drains/Wounds Patient Lines/Drains/Airways Status   Active Line/Drains/Airways    Name:   Placement date:   Placement time:   Site:   Days:   Peripheral IV 03/27/19 Right Antecubital   03/27/19    1951     Antecubital   less than 1          Intake/Output Last 24 hours No intake or output data in the 24 hours ending 03/27/19 2028  Labs/Imaging Results for orders placed or performed during the hospital encounter of 03/27/19 (from the past 48 hour(s))  Protime-INR     Status: None   Collection Time: 03/27/19  6:34 PM  Result Value Ref Range   Prothrombin Time 12.4 11.4 - 15.2 seconds   INR 0.9 0.8 - 1.2    Comment: (NOTE) INR goal varies based on device and disease states. Performed at [facility], [place] [address], [place], [place] [address]   APTT     Status: None   Collection Time: 03/27/19  6:34 PM  Result Value Ref Range   aPTT 26 24 - 36 seconds    Comment: Performed at [facility] [address], [place], [place] [address]  CBC     Status: Abnormal   Collection Time: 03/27/19  6:34 PM  Result Value Ref Range   WBC 3.9 (L) 4.0 - 10.5 K/uL   RBC 5.13 (H) 3.87 - 5.11 MIL/uL   Hemoglobin 15.8 (H) 12.0 - 15.0 g/dL   HCT 47.5 (H) 36.0 - 46.0 %   MCV 92.6 80.0 - 100.0 fL   MCH 30.8 26.0 - 34.0 pg   MCHC 33.3 30.0 - 36.0 g/dL   RDW 12.3 11.5 - 15.5 %   Platelets 177 150 - 400 K/uL   nRBC 0.0 0.0 - 0.2 %  Comment: Performed at [facility], [place] [address], [place], [place] [address]  Differential     Status: Abnormal   Collection Time: 03/27/19  6:34 PM  Result Value Ref Range   Neutrophils Relative % 39 %   Neutro Abs 1.5 (L) 1.7 - 7.7 K/uL   Lymphocytes Relative 48 %   Lymphs Abs 1.8 0.7 - 4.0 K/uL   Monocytes Relative 9 %   Monocytes Absolute 0.4 0.1 - 1.0 K/uL   Eosinophils Relative 4 %   Eosinophils Absolute 0.2 0.0 - 0.5 K/uL   Basophils Relative 0 %   Basophils Absolute 0.0 0.0 - 0.1 K/uL   Immature Granulocytes 0 %   Abs Immature Granulocytes 0.01 0.00 - 0.07 K/uL    Comment: Performed at [facility] [address], [place], [place] [address]  Comprehensive metabolic panel     Status: Abnormal   Collection Time: 03/27/19  6:34 PM   Result Value Ref Range   Sodium 140 135 - 145 mmol/L   Potassium 3.3 (L) 3.5 - 5.1 mmol/L   Chloride 101 98 - 111 mmol/L   CO2 26 22 - 32 mmol/L   Glucose, Bld 87 70 - 99 mg/dL   BUN 10 6 - 20 mg/dL   Creatinine, Ser 0.95 0.44 - 1.00 mg/dL   Calcium 9.6 8.9 - 10.3 mg/dL   Total Protein 7.4 6.5 - 8.1 g/dL   Albumin 4.0 3.5 - 5.0 g/dL   AST 25 15 - 41 U/L   ALT 23 0 - 44 U/L   Alkaline Phosphatase 77 38 - 126 U/L   Total Bilirubin 2.0 (H) 0.3 - 1.2 mg/dL   GFR calc non Af Amer >60 >60 mL/min   GFR calc Af Amer >60 >60 mL/min   Anion gap 13 5 - 15    Comment: Performed at [facility], [place] [address], [place], [place] [address]  I-Stat beta hCG blood, ED     Status: None   Collection Time: 03/27/19  6:42 PM  Result Value Ref Range   I-stat hCG, quantitative <5.0 <5 mIU/mL   Comment 3            Comment:   GEST. AGE      CONC.  (mIU/mL)   <=1 WEEK        5 - 50     2 WEEKS       50 - 500     3 WEEKS       100 - 10,000     4 WEEKS     1,000 - 30,000        FEMALE AND NON-PREGNANT FEMALE:     LESS THAN 5 mIU/mL   I-stat Creatinine, ED     Status: None   Collection Time: 03/27/19  6:44 PM  Result Value Ref Range   Creatinine, Ser 1.00 0.44 - 1.00 mg/dL  CBG monitoring, ED     Status: None   Collection Time: 03/27/19  6:56 PM  Result Value Ref Range   Glucose-Capillary 97 70 - 99 mg/dL   Comment 1 Notify RN    Comment 2 Document in Chart    Ct Head Code Stroke Wo Contrast  Result Date: 03/27/2019 CLINICAL DATA:  Code stroke.  Right-sided weakness. EXAM: CT HEAD WITHOUT CONTRAST TECHNIQUE: Contiguous axial images were obtained from the base of the skull through the vertex without intravenous contrast. COMPARISON:  Brain MRI 07/06/2015 FINDINGS: Brain: There is  no evidence of acute infarct, intracranial hemorrhage, mass, midline shift, or extra-axial fluid collection. Chronic small to moderate-sized posterior right frontal and left parietal infarcts, a small chronic left  frontal and right parietal infarcts, and a small chronic right cerebellar infarct are unchanged. Hypodensities elsewhere in the cerebral white matter bilaterally are nonspecific but compatible with mild chronic small vessel ischemic disease. There is mild cerebral atrophy. Vascular: Calcified atherosclerosis at the skull base. No hyperdense vessel. Skull: No fracture 5 mm lucency in the left frontoparietal skull at the vertex, also present in 2008 and considered benign. Sinuses/Orbits: Mild mucosal thickening in the right frontal, bilateral ethmoid, and left sphenoid sinuses. Clear mastoid air cells. Unremarkable orbits. Other: None. ASPECTS [facility] Stroke Program Early CT Score) - Ganglionic level infarction (caudate, lentiform nuclei, internal capsule, insula, M1-M3 cortex): 7 - Supraganglionic infarction (M4-M6 cortex): 3 Total score (0-10 with 10 being normal): 10 IMPRESSION: 1. No evidence of acute intracranial abnormality. 2. ASPECTS is 10. 3. Multiple chronic infarcts, unchanged from 2016. These results were communicated to Dr. Lorraine Lax at 6:52 pm on 03/27/2019 by text page via the [facility] messaging system. Electronically Signed   By: Logan Bores M.D.   On: 03/27/2019 18:52    Pending Labs Unresulted Labs (From admission, onward)    Start     Ordered   04/03/19 0500  Creatinine, serum  (enoxaparin (LOVENOX)    CrCl >/= 30 ml/min)  Weekly,   R    Comments:  while on enoxaparin therapy    03/27/19 1956   03/28/19 0500  Hemoglobin A1c  Tomorrow morning,   R     03/27/19 1956   03/28/19 0500  Lipid panel  Tomorrow morning,   R    Comments:  Fasting    03/27/19 1956          Vitals/Pain Today's Vitals   03/27/19 1853 03/27/19 1854 03/27/19 1900 03/27/19 1915  BP:  (!) 158/90 (!) 134/107 (!) 150/136  Pulse: 67 65 68 71  Resp: 20 16 18 20   Temp:  98.7 F (37.1 C)    TempSrc:  Oral    SpO2: 98% 100% 100% 100%  Weight:      PainSc:        Isolation Precautions No active  isolations  Medications Medications  sodium chloride flush (NS) 0.9 % injection 3 mL (has no administration in time range)  aspirin chewable tablet 324 mg (has no administration in time range)  dolutegravir (TIVICAY) tablet 50 mg (has no administration in time range)  emtricitabine-tenofovir AF (DESCOVY) 200-25 MG per tablet 1 tablet (has no administration in time range)  PARoxetine (PAXIL) tablet 30 mg (has no administration in time range)  traZODone (DESYREL) tablet 25 mg (has no administration in time range)  albuterol (PROVENTIL HFA;VENTOLIN HFA) 108 (90 Base) MCG/ACT inhaler 2 puff (has no administration in time range)   stroke: mapping our early stages of recovery book (has no administration in time range)  acetaminophen (TYLENOL) tablet 650 mg (has no administration in time range)    Or  acetaminophen (TYLENOL) solution 650 mg (has no administration in time range)    Or  acetaminophen (TYLENOL) suppository 650 mg (has no administration in time range)  senna-docusate (Senokot-S) tablet 1 tablet (has no administration in time range)  enoxaparin (LOVENOX) injection 40 mg (has no administration in time range)  0.9 %  sodium chloride infusion (has no administration in time range)  potassium chloride SA (K-DUR,KLOR-CON) CR tablet 40 mEq (has no  administration in time range)  aspirin suppository 300 mg (has no administration in time range)    Or  aspirin tablet 325 mg (has no administration in time range)  LORazepam (ATIVAN) injection 1 mg (has no administration in time range)    Mobility walks Low fall risk   Focused Assessments Neuro Assessment Handoff:  Swallow screen pass? Yes    NIH Stroke Scale ( + Modified Stroke Scale Criteria)  Interval: Initial Level of Consciousness (1a.)   : Alert, keenly responsive LOC Questions (1b. )   +: Answers both questions correctly LOC Commands (1c. )   + : Performs both tasks correctly Best Gaze (2. )  +: Normal Visual (3. )  +: No  visual loss Facial Palsy (4. )    : Normal symmetrical movements Motor Arm, Left (5a. )   +: No drift Motor Arm, Right (5b. )   +: Drift Motor Leg, Left (6a. )   +: No drift Motor Leg, Right (6b. )   +: No drift Limb Ataxia (7. ): Present in one limb Sensory (8. )   +: Normal, no sensory loss Best Language (9. )   +: No aphasia Dysarthria (10. ): Normal Extinction/Inattention (11.)   +: No Abnormality Modified SS Total  +: 1 Complete NIHSS TOTAL: 2 Last date known well: 03/27/19 Last time known well: 1730 Neuro Assessment: Within Defined Limits Neuro Checks:   Initial (03/27/19 1854)  Last Documented NIHSS Modified Score: 1 (03/27/19 1854) Has TPA been given? No If patient is a Neuro Trauma and patient is going to OR before floor call report to [facility] nurse: [phone] or [phone]     R Recommendations: See Admitting Provider Note  Report given to:   Additional Notes:  NIH 2

## 2019-03-27 NOTE — ED Notes (Signed)
Activated code stroke with ruby from [facility]

## 2019-03-27 NOTE — H&P (Signed)
History and Physical    Abigail Medina VEL:[id number] DOB: November 25, 1966 DOA: 03/27/2019  PCP: Lanae Boast, FNP   Patient coming from: Home   Chief Complaint: Right arm numbness, weakness, loss of coordination   HPI: Abigail Medina is a 53 y.o. female with medical history significant for right MCA stroke, HIV (CD4 400 and VL undetectable in January), hypertension, and depression with anxiety, now presenting to the emergency department for evaluation of right-sided weakness.  Patient had been in her usual state of health with no recent illness, having an uneventful day until approximately 5:30 PM when she noted paresthesias involving the distal right upper extremity.  She also reports weakness and loss of coordination involving the right arm.  She has residual left-sided numbness and some weakness from a prior CVA, but reports that the right-sided symptoms are new.  She denies any headache, change in vision or hearing, or lower extremity or facial involvement.  No recent fall or trauma, no fevers or chills, no chest pain or palpitations, and no cough or dyspnea.  ED Course: Upon arrival to the ED, patient is found to be afebrile, saturating well on room air, and with vitals otherwise stable.  EKG features a sinus rhythm with LAFB, nonspecific ST abnormality.  Noncontrast head CT is negative for acute intracranial abnormality but notable for multiple chronic infarctions that are unchanged from 2016.  Chemistry panel is notable for mild hypokalemia and total bilirubin of 2.0.  CBC features a chronic stable leukopenia with WBC 3900.  Code stroke was activated and patient was seen by neurology in the emergency department with recommendations for medical admission and ongoing evaluation and management of acute focal neurologic deficits.  Review of Systems:  All other systems reviewed and apart from HPI, are negative.  Past Medical History:  Diagnosis Date  . HIV (human immunodeficiency virus infection)  ([place])   . Hypertension   . Seizures ([place])   . Stroke ([place])   . Vision abnormalities     History reviewed. No pertinent surgical history.   reports that she has quit smoking. Her smoking use included cigarettes. She started smoking about 3 years ago. She smoked 0.25 packs per day. She has never used smokeless tobacco. She reports current alcohol use of about 2.0 - 3.0 standard drinks of alcohol per week. She reports that she does not use drugs.  Allergies  Allergen Reactions  . Lisinopril Swelling    Family History  Problem Relation Age of Onset  . Hypertension Mother      Prior to Admission medications   Medication Sig Start Date End Date Taking? Authorizing Provider  albuterol (PROVENTIL HFA;VENTOLIN HFA) 108 (90 Base) MCG/ACT inhaler Inhale 2 puffs into the lungs every 4 (four) hours as needed for wheezing or shortness of breath (cough, shortness of breath or wheezing.). 12/15/18   [place] Callas, NP  aspirin 81 MG tablet Take 81 mg by mouth daily.    [provider]  cetirizine (ZYRTEC) 10 MG tablet Take 1 tablet (10 mg total) by mouth daily. 09/07/18   Carlyle Basques, MD  cimetidine (TAGAMET) 200 MG tablet Take 1 tablet (200 mg total) by mouth 2 (two) times daily. 05/16/16   Dorena Dew, FNP  dolutegravir (TIVICAY) 50 MG tablet TAKE 1 TABLET(50 MG) BY MOUTH DAILY 01/27/19   Carlyle Basques, MD  emtricitabine-tenofovir AF (DESCOVY) 200-25 MG tablet Take 1 tablet by mouth daily. 01/27/19   Carlyle Basques, MD  fluticasone (FLONASE) 50 MCG/ACT nasal spray SPRAY 2 SPRAYS  INTO EACH NOSTRIL EVERY DAY 03/25/19   [place] Callas, NP  gabapentin (NEURONTIN) 400 MG capsule Take 1 capsule (400 mg total) by mouth 3 (three) times daily. Start taking 1 tab daily x 7d, then twice a day x 7d, then can do TID 09/30/18   Carlyle Basques, MD  hydrochlorothiazide (HYDRODIURIL) 25 MG tablet Take 1 tablet (25 mg total) by mouth daily. 09/07/18   Carlyle Basques, MD  metoprolol succinate  (TOPROL-XL) 100 MG 24 hr tablet Take 1 tablet (100 mg total) by mouth daily. Take with or immediately following a meal. 09/07/18   Carlyle Basques, MD  PARoxetine (PAXIL) 30 MG tablet TAKE 1 TABLET BY MOUTH EVERY DAY 02/15/19   Carlyle Basques, MD  traZODone (DESYREL) 50 MG tablet TAKE 0.5 TABLETS (25 MG TOTAL) BY MOUTH AT BEDTIME AS NEEDED FOR SLEEP. 06/05/17   Carlyle Basques, MD  Vitamin D, Ergocalciferol, (DRISDOL) 1.25 MG (50000 UT) CAPS capsule TAKE ONE CAPSULE BY MOUTH ONE TIME PER WEEK 01/14/19   Lanae Boast, FNP    Physical Exam: Vitals:   03/27/19 1853 03/27/19 1854 03/27/19 1900 03/27/19 1915  BP:  (!) 158/90 (!) 134/107 (!) 150/136  Pulse: 67 65 68 71  Resp: 20 16 18 20   Temp:  98.7 F (37.1 C)    TempSrc:  Oral    SpO2: 98% 100% 100% 100%  Weight:        Constitutional: NAD, calm  Eyes: PERTLA, lids and conjunctivae normal ENMT: Mucous membranes are moist. Posterior pharynx clear of any exudate or lesions.   Neck: normal, supple, no masses, no thyromegaly Respiratory: clear to auscultation bilaterally, no wheezing, no crackles.  No accessory muscle use.  Cardiovascular: S1 & S2 heard, regular rate and rhythm. No extremity edema.  Abdomen: No distension, no tenderness, soft. Bowel sounds active.  Musculoskeletal: no clubbing / cyanosis. No joint deformity upper and lower extremities.   Skin: no significant rashes, lesions, ulcers. Warm, dry, well-perfused. Neurologic: CN 2-12 grossly intact. Sensation to light touch is diminished in distal RUE, and distal LLE. Strength 5/5 in all 4 limbs.  Psychiatric: Alert and oriented x 3. Pleasant, cooperative.    Labs on Admission: I have personally reviewed following labs and imaging studies  CBC: Recent Labs  Lab 03/27/19 1834  WBC 3.9*  NEUTROABS 1.5*  HGB 15.8*  HCT 47.5*  MCV 92.6  PLT 546   Basic Metabolic Panel: Recent Labs  Lab 03/27/19 1834 03/27/19 1844  NA 140  --   K 3.3*  --   CL 101  --   CO2 26  --    GLUCOSE 87  --   BUN 10  --   CREATININE 0.95 1.00  CALCIUM 9.6  --    GFR: Estimated Creatinine Clearance: 61.4 mL/min (by C-G formula based on SCr of 1 mg/dL). Liver Function Tests: Recent Labs  Lab 03/27/19 1834  AST 25  ALT 23  ALKPHOS 77  BILITOT 2.0*  PROT 7.4  ALBUMIN 4.0   No results for input(s): LIPASE, AMYLASE in the last 168 hours. No results for input(s): AMMONIA in the last 168 hours. Coagulation Profile: Recent Labs  Lab 03/27/19 1834  INR 0.9   Cardiac Enzymes: No results for input(s): CKTOTAL, CKMB, CKMBINDEX, TROPONINI in the last 168 hours. BNP (last 3 results) No results for input(s): PROBNP in the last 8760 hours. HbA1C: No results for input(s): HGBA1C in the last 72 hours. CBG: Recent Labs  Lab 03/27/19 1856  GLUCAP 97  Lipid Profile: No results for input(s): CHOL, HDL, LDLCALC, TRIG, CHOLHDL, LDLDIRECT in the last 72 hours. Thyroid Function Tests: No results for input(s): TSH, T4TOTAL, FREET4, T3FREE, THYROIDAB in the last 72 hours. Anemia Panel: No results for input(s): VITAMINB12, FOLATE, FERRITIN, TIBC, IRON, RETICCTPCT in the last 72 hours. Urine analysis:    Component Value Date/Time   COLORURINE YELLOW 04/09/2012 1010   APPEARANCEUR CLEAR 04/09/2012 1010   LABSPEC 1.025 04/30/2017 1118   PHURINE 5.5 04/30/2017 1118   GLUCOSEU NEGATIVE 04/30/2017 1118   HGBUR NEGATIVE 04/30/2017 1118   BILIRUBINUR neg 01/14/2019 1132   [place] 04/30/2017 1118   PROTEINUR Positive (A) 01/14/2019 1132   PROTEINUR NEGATIVE 04/30/2017 1118   UROBILINOGEN 2.0 (A) 01/14/2019 1132   UROBILINOGEN 1.0 04/30/2017 1118   NITRITE neg 01/14/2019 1132   NITRITE NEGATIVE 04/30/2017 1118   LEUKOCYTESUR Trace (A) 01/14/2019 1132   Sepsis Labs: @LABRCNTIP (procalcitonin:4,lacticidven:4) )No results found for this or any previous visit (from the past 240 hour(s)).   Radiological Exams on Admission: Ct Head Code Stroke Wo Contrast  Result Date:  03/27/2019 CLINICAL DATA:  Code stroke.  Right-sided weakness. EXAM: CT HEAD WITHOUT CONTRAST TECHNIQUE: Contiguous axial images were obtained from the base of the skull through the vertex without intravenous contrast. COMPARISON:  Brain MRI 07/06/2015 FINDINGS: Brain: There is no evidence of acute infarct, intracranial hemorrhage, mass, midline shift, or extra-axial fluid collection. Chronic small to moderate-sized posterior right frontal and left parietal infarcts, a small chronic left frontal and right parietal infarcts, and a small chronic right cerebellar infarct are unchanged. Hypodensities elsewhere in the cerebral white matter bilaterally are nonspecific but compatible with mild chronic small vessel ischemic disease. There is mild cerebral atrophy. Vascular: Calcified atherosclerosis at the skull base. No hyperdense vessel. Skull: No fracture 5 mm lucency in the left frontoparietal skull at the vertex, also present in 2008 and considered benign. Sinuses/Orbits: Mild mucosal thickening in the right frontal, bilateral ethmoid, and left sphenoid sinuses. Clear mastoid air cells. Unremarkable orbits. Other: None. ASPECTS [facility] Stroke Program Early CT Score) - Ganglionic level infarction (caudate, lentiform nuclei, internal capsule, insula, M1-M3 cortex): 7 - Supraganglionic infarction (M4-M6 cortex): 3 Total score (0-10 with 10 being normal): 10 IMPRESSION: 1. No evidence of acute intracranial abnormality. 2. ASPECTS is 10. 3. Multiple chronic infarcts, unchanged from 2016. These results were communicated to Dr. Lorraine Lax at 6:52 pm on 03/27/2019 by text page via the [facility] messaging system. Electronically Signed   By: Logan Bores M.D.   On: 03/27/2019 18:52    EKG: Independently reviewed. Sinus rhythm, LAFB, non-specific ST-abnormality inferiorly.   Assessment/Plan   1. Acute focal neurologic deficit; history of CVA  - Presents with acute-onset RUE numbness, weakness, and loss of coordination at ~17:30   - CT head negative for acute findings, notable for unchanged chronic infarctions  - Neurology is consulting and much appreciated  - Continue frequent neuro checks, cardiac monitoring  - Check CTA head and neck, MRI brain, echocardiogram, fasting lipids, A1c  - Consult with PT/OT/SLP, continue ASA, consider statin pending fasting lipids   2. HIV  - CD4 400 and VL undetectable in January 2020  - Continue Tivicay and Descovy    3. Hypertension  - Permissive hypertension in acute-phase suspected ischemic CVA   4. Depression with anxiety  - Continue Paxil and trazodone     5. Hypokalemia  - Serum potassium is 3.3 in ED  - Treated with 40 mEq oral potassium   6.  Hyperbilirubinemia  - Total bilirubin is 2.0 on admission with other LFT's and INR normal  - Has been elevated intermittently in past, no abdominal tenderness, possibly from her HIV medication, will fractionate    PPE: Mask, face shield  DVT prophylaxis: Lovenox  Code Status: Full  Family Communication: Discussed with patient  Consults called: Neurology  Admission status: Observation     Vianne Bulls, MD [facility] Pager [phone]  If 7PM-7AM, please contact night-coverage [email or web address] Password [email or web address]  03/27/2019, 7:57 PM

## 2019-03-27 NOTE — Consult Note (Signed)
NEURO HOSPITALIST  CONSULT   Requesting Physician: Dr. Rex Kras    Chief Complaint:  Right side weakness/numbness   History obtained from:  Patient    HPI:                                                                                                                                         Abigail Medina is an 53 y.o. female  With PMH of seizures, HIV, HTN, HLD, CVA ( 01/2011 with residual left side deficits)  who presented to [facility] ED with c/o right side numbness and tingling. Code stroke activated in ED.   Patient was last known normal around 5:30 PM today when she was walking to get a drink.  She noticed that she had weakness of her right upper extremity difficulty controlling her arm. She is denied any weakness and walked to the ER.  He was stroke alerted by ED triage.  CT head was performed.  Showed old right MCA encephalomalacia.  NIH stroke scale was 2.  Patient has history of venous angioma noted on MRI brain.  Given low NIH stroke scale with mild and nondisabling deficits and relative contraindication of venous angioma/hemosiderin deposition-decided not to pursue IV TPA.  Patient was still kept in the window and if exam were to change would reconsider IV TPA.    Stroke history 2/12: right MCA branch infarct; with residual left arm spasticity, left facial droop and left arm weakness.    Date last known well: 03/27/2019 Time last known well: 1730 tPA Given: No: mild and non disabling symptoms/  Modified Rankin: 2   Past Medical History:  Diagnosis Date  . HIV (human immunodeficiency virus infection) ([place])   . Hypertension   . Seizures ([place])   . Stroke ([place])   . Vision abnormalities     No past surgical history on file.  Family History  Problem Relation Age of Onset  . Hypertension Mother         Social History:  reports that she has quit smoking. Her smoking use included cigarettes. She started smoking about 3 years ago. She  smoked 0.25 packs per day. She has never used smokeless tobacco. She reports current alcohol use of about 2.0 - 3.0 standard drinks of alcohol per week. She reports that she does not use drugs.  Allergies:  Allergies  Allergen Reactions  . Lisinopril Swelling    Medications:  Current Facility-Administered Medications  Medication Dose Route Frequency Provider Last Rate Last Dose  . sodium chloride flush (NS) 0.9 % injection 3 mL  3 mL Intravenous Once Little, Wenda Overland, MD       Current Outpatient Medications  Medication Sig Dispense Refill  . albuterol (PROVENTIL HFA;VENTOLIN HFA) 108 (90 Base) MCG/ACT inhaler Inhale 2 puffs into the lungs every 4 (four) hours as needed for wheezing or shortness of breath (cough, shortness of breath or wheezing.). 1 Inhaler 6  . aspirin 81 MG tablet Take 81 mg by mouth daily.    . cetirizine (ZYRTEC) 10 MG tablet Take 1 tablet (10 mg total) by mouth daily. 30 tablet 11  . cimetidine (TAGAMET) 200 MG tablet Take 1 tablet (200 mg total) by mouth 2 (two) times daily. 30 tablet 11  . dolutegravir (TIVICAY) 50 MG tablet TAKE 1 TABLET(50 MG) BY MOUTH DAILY 30 tablet 3  . emtricitabine-tenofovir AF (DESCOVY) 200-25 MG tablet Take 1 tablet by mouth daily. 30 tablet 3  . fluticasone (FLONASE) 50 MCG/ACT nasal spray SPRAY 2 SPRAYS INTO EACH NOSTRIL EVERY DAY 16 g 2  . gabapentin (NEURONTIN) 400 MG capsule Take 1 capsule (400 mg total) by mouth 3 (three) times daily. Start taking 1 tab daily x 7d, then twice a day x 7d, then can do TID 90 capsule 5  . hydrochlorothiazide (HYDRODIURIL) 25 MG tablet Take 1 tablet (25 mg total) by mouth daily. 90 tablet 11  . metoprolol succinate (TOPROL-XL) 100 MG 24 hr tablet Take 1 tablet (100 mg total) by mouth daily. Take with or immediately following a meal. 30 tablet 11  . PARoxetine (PAXIL) 30 MG tablet  TAKE 1 TABLET BY MOUTH EVERY DAY 90 tablet 4  . traZODone (DESYREL) 50 MG tablet TAKE 0.5 TABLETS (25 MG TOTAL) BY MOUTH AT BEDTIME AS NEEDED FOR SLEEP. 30 tablet 3  . Vitamin D, Ergocalciferol, (DRISDOL) 1.25 MG (50000 UT) CAPS capsule TAKE ONE CAPSULE BY MOUTH ONE TIME PER WEEK 8 capsule 1     ROS:                                                                                                                                       ROS was performed and is negative except as noted in HPI    General Examination:    Per Dr. Lorraine Lax  Weight 74.4 kg, last menstrual period 08/04/2014.  HEENT-  Normocephalic, no lesions, without obvious abnormality.  Normal external eye and conjunctiva. Cardiovascular- S1-S2 audible, pulses palpable throughout  Lungs-no rhonchi or wheezing noted, no excessive working breathing.  Saturations within normal limits Abdomen- All 4 quadrants palpated and non tender Extremities- Warm, dry and intact Musculoskeletal-no joint tenderness, deformity or swelling Skin-warm and dry, no hyperpigmentation, vitiligo, or suspicious lesions  Neurological Examination per  Mental Status: Alert, oriented, thought content appropriate.  Speech fluent without evidence of aphasia.  Able to follow 3 step commands without difficulty. Cranial Nerves: II: visual fields grossly normal, pupils equal, round, reactive to light and accommodation III,IV, VI: ptosis not present, extraocular muscles extra-ocular motions intact bilaterally V,VII: smile symmetric, facial light touch sensation normal bilaterally VIII: hearing normal bilaterally IX,X: gag reflex present XI: trapezius strength/neck flexion strength normal bilaterally XII: tongue strength normal  Motor: Right upper extremity has mild pronator drift.  Good grip strength.  Reduced finger tapping.  Right lower extremity has 5 x 5 strength.   Left upper extremity and left lower extremity also have 5 out of 5 strength. Tone and bulk: Mildly increased tone over the left arm and left leg Sensory: Pinprick and light touch intact throughout, bilaterally Deep Tendon Reflexes:  Right biceps and patellar reflexes 2+, left biceps and patellar reflexes 3+.  No ankle clonus bilaterally Plantars: Right: downgoing   Left: downgoing Cerebellar: Ataxia on finger-to-nose in the right upper extremity. Gait: Not assessed due to safety.    Lab Results: Basic Metabolic Panel: No results for input(s): NA, K, CL, CO2, GLUCOSE, BUN, CREATININE, CALCIUM, MG, PHOS in the last 168 hours.  CBC: No results for input(s): WBC, NEUTROABS, HGB, HCT, MCV, PLT in the last 168 hours.  Lipid Panel: No results for input(s): CHOL, TRIG, HDL, CHOLHDL, VLDL, LDLCALC in the last 168 hours.  CBG: No results for input(s): GLUCAP in the last 168 hours.  Imaging: No results found.     Laurey Morale, MSN, NP-C [facility] [phone]  03/27/2019, 6:32 PM    Assessment: 53 y.o. female With PMH of seizures, HIV, HTN, HLD, CVA ( 01/2011 with residual left side deficits)  who presented to [facility] ED with c/o right side numbness and tingling. Code stroke activated in ED.  CTH: no hemorrhage.    Given low NIH stroke scale with mild and nondisabling deficits and relative contraindication of venous angioma/hemosiderin deposition-decided not to pursue IV TPA.  Patient was still kept in the window till 10 pm and if she were to worsen,  would reconsider IV TPA administration.  Signed out to Dr. Amie Portland regarding plan. Nurse notified to do q30 min neurochecks.   Stroke Risk Factors - hyperlipidemia and hypertension    Recommendations: -- BP goal : Permissive HTN upto 220/110 mmHg   --MRI Brain  --CTA if normal renal function, otherwise MRA head and carotid Doppler --Echocardiogram --ASA 325 daily, start Plavix 75mg  tomorrow for 3 weeks -- High  intensity Statin if LDL > 70 -- HgbA1c, fasting lipid panel -- PT consult, OT consult, Speech consult --Telemetry monitoring --Frequent neuro checks --Stroke swallow screen     --please page stroke NP  Or  PA  Or MD from 8am -4 pm  as this patient from this time will be  followed by the stroke.   You can look them up on [email or web address]  Password [email or web address]  Performed face-to-face diagnostic evaluation at the time of stroke alert.  Note assisted by [facility]  Karena Addison Jizelle Conkey MD [facility] [phone]   If 7pm to 7am, please call on call as listed on AMION.

## 2019-03-27 NOTE — Progress Notes (Signed)
Examined patient again on Dr Aroor's request. No drift in any of the UEs. No aphasia. No CN Deficits. Will watch closely for any change till she is in tPA window. Otherwise, recs per Dr. Arrie Eastern consult. Stroke team will follow.  Amie Portland, MD Neurology

## 2019-03-27 NOTE — ED Notes (Signed)
Spoke with MD Rory Percy, STAT MRI is not indicated at this time. Will continue q77min neuro checks until 2230.

## 2019-03-27 NOTE — ED Provider Notes (Addendum)
[facility] Provider Note   CSN: [id number] Arrival date & time: 03/27/19  1813    History   Chief Complaint Chief Complaint  Patient presents with   Code Stroke    HPI [facility] is a 53 y.o. female.     HPI   Patient is a 53 year old female with a history of HIV, hypertension, seizures, CVA (with chronic left-sided deficits), who presents to the emergency department today for evaluation of strokelike symptoms starting at 530 today.  Patient states that she started having right upper extremity and right lower extremity numbness/weakness.  States that she does not feel like she has control over her right upper extremity.    Her family also noted that she had some slurred speech and difficulty with word finding. She states she does not normally have slurred speech from her prior stroke.  She does report chronic left-sided deficits including left facial droop.  Denies other symptoms.   Past Medical History:  Diagnosis Date   HIV (human immunodeficiency virus infection) ([place])    Hypertension    Seizures ([place])    Stroke [facility])    Vision abnormalities     Patient Active Problem List   Diagnosis Date Noted   Aneurysm of thoracic aorta ([place]) 03/29/2019   Cerebral thrombosis with cerebral infarction 03/28/2019   CVA (cerebral vascular accident) ([place]) 03/28/2019   Acute focal neurological deficit 03/27/2019   Hypokalemia 03/27/2019   Hyperbilirubinemia 03/27/2019   History of CVA with residual deficit    Pain of back and left lower extremity 12/15/2018   Upper respiratory infection, viral 12/15/2018   Ulnar neuritis 06/22/2015   Tobacco dependence 06/02/2015   Numbness and tingling of right arm 05/30/2015   Right arm weakness 05/30/2015   Arm numbness left 10/10/2014   Vitamin D deficiency 08/05/2014   Allergic rhinitis 05/25/2013   Nasal septal perforation 05/25/2013   Polypharmacy 08/20/2012   HIV (human  immunodeficiency virus infection) ([place]) 08/20/2012   HLD (hyperlipidemia) 07/15/2012   BP (high blood pressure) 07/15/2012   Cerebrovascular accident, late effects 01/30/2012   Depression with anxiety 07/15/2011   Seborrhea capitis 04/18/2011   Seborrheic dermatitis 03/07/2011   Hemiplegia, late effect of cerebrovascular disease ([place]) 01/25/2011   VAGINAL DISCHARGE 12/26/2010   NEVUS 08/29/2010   AMENORRHEA 07/19/2010   SKIN RASH 05/21/2010   THRUSH 12/28/2009   Human immunodeficiency virus (HIV) disease ([place]) 08/06/2007   Essential hypertension 08/06/2007    Past Surgical History:  Procedure Laterality Date   LOOP RECORDER INSERTION N/A 03/29/2019   Procedure: LOOP RECORDER INSERTION;  Surgeon: Constance Haw, MD;  Location: [facility];  Service: Cardiovascular;  Laterality: N/A;     OB History   No obstetric history on file.      Home Medications    Prior to Admission medications   Medication Sig Start Date End Date Taking? Authorizing Provider  cetirizine (ZYRTEC) 10 MG tablet Take 1 tablet (10 mg total) by mouth daily. 09/07/18  Yes Carlyle Basques, MD  cimetidine (TAGAMET) 200 MG tablet Take 1 tablet (200 mg total) by mouth 2 (two) times daily. Patient taking differently: Take 200 mg by mouth daily as needed (heartburn).  05/16/16  Yes Dorena Dew, FNP  dolutegravir (TIVICAY) 50 MG tablet TAKE 1 TABLET(50 MG) BY MOUTH DAILY Patient taking differently: Take 50 mg by mouth daily. TAKE 1 TABLET(50 MG) BY MOUTH DAILY 01/27/19  Yes Carlyle Basques, MD  emtricitabine-tenofovir AF (DESCOVY) 200-25 MG  tablet Take 1 tablet by mouth daily. 01/27/19  Yes Carlyle Basques, MD  fluticasone (FLONASE) 50 MCG/ACT nasal spray SPRAY 2 SPRAYS INTO EACH NOSTRIL EVERY DAY Patient taking differently: Place 2 sprays into both nostrils daily.  03/25/19  Yes [place] Callas, NP  gabapentin (NEURONTIN) 400 MG capsule Take 1 capsule (400 mg total) by mouth 3 (three)  times daily. Start taking 1 tab daily x 7d, then twice a day x 7d, then can do TID Patient taking differently: Take 400 mg by mouth See admin instructions. Start taking 1 tab daily x 7d, then twice a day x 7d, then can take 1 capsule by mouth TID thereafter. 09/30/18  Yes Carlyle Basques, MD  hydrochlorothiazide (HYDRODIURIL) 25 MG tablet Take 1 tablet (25 mg total) by mouth daily. 09/07/18  Yes Carlyle Basques, MD  PARoxetine (PAXIL) 30 MG tablet TAKE 1 TABLET BY MOUTH EVERY DAY Patient taking differently: Take 30 mg by mouth daily.  02/15/19  Yes Carlyle Basques, MD  Vitamin D, Ergocalciferol, (DRISDOL) 1.25 MG (50000 UT) CAPS capsule TAKE ONE CAPSULE BY MOUTH ONE TIME PER WEEK Patient taking differently: Take 50,000 Units by mouth every 7 (seven) days.  01/14/19  Yes Lanae Boast, FNP  albuterol (PROVENTIL HFA;VENTOLIN HFA) 108 (90 Base) MCG/ACT inhaler Inhale 2 puffs into the lungs every 4 (four) hours as needed for wheezing or shortness of breath (cough, shortness of breath or wheezing.). 12/15/18   [place] Callas, NP  aspirin EC 81 MG EC tablet Take 1 tablet (81 mg total) by mouth daily for 21 days. 03/30/19 04/20/19  Radene Gunning, NP  atorvastatin (LIPITOR) 40 MG tablet Take 1 tablet (40 mg total) by mouth daily at 6 PM. 03/29/19   Black, Lezlie Octave, NP  clopidogrel (PLAVIX) 75 MG tablet Take 1 tablet (75 mg total) by mouth daily. 03/29/19   Black, Lezlie Octave, NP  metoprolol succinate (TOPROL-XL) 50 MG 24 hr tablet Take 1 tablet (50 mg total) by mouth daily. Take with or immediately following a meal. 04/01/19 06/30/19  Camnitz, Ocie Doyne, MD  traZODone (DESYREL) 50 MG tablet TAKE 0.5 TABLETS (25 MG TOTAL) BY MOUTH AT BEDTIME AS NEEDED FOR SLEEP. 06/05/17   Carlyle Basques, MD  vitamin B-12 (CYANOCOBALAMIN) 500 MCG tablet Take 1 tablet (500 mcg total) by mouth daily. 03/30/19   Black, Lezlie Octave, NP    Family History Family History  Problem Relation Age of Onset   Hypertension Mother     Social  History Social History   Tobacco Use   Smoking status: Former Smoker    Packs/day: 0.25    Types: Cigarettes    Start date: 10/17/2015   Smokeless tobacco: Never Used  Substance Use Topics   Alcohol use: Yes    Alcohol/week: 2.0 - 3.0 standard drinks    Types: 2 - 3 Standard drinks or equivalent per week    Comment: liquor or beer; stated has increased with social meetings   Drug use: No     Allergies   Lisinopril   Review of Systems Review of Systems  HENT: Negative for congestion.   Eyes: Negative for visual disturbance.  Respiratory: Negative for shortness of breath.   Cardiovascular: Negative for chest pain.  Gastrointestinal: Negative for abdominal pain, nausea and vomiting.  Genitourinary: Negative for dysuria and frequency.  Musculoskeletal: Negative for myalgias.  Skin: Negative for rash.  Neurological: Positive for speech difficulty, weakness and numbness. Negative for dizziness, light-headedness and headaches.    Physical Exam  Updated Vital Signs BP (!) 136/95 (BP Location: Left Arm)    Pulse 60    Temp 98.2 F (36.8 C) (Oral)    Resp 12    Ht 5\' 4"  (1.626 m)    Wt 73.3 kg    LMP 08/04/2014    SpO2 100%    BMI 27.74 kg/m   Physical Exam Vitals signs and nursing note reviewed.  Constitutional:      General: She is not in acute distress.    Appearance: She is well-developed.  HENT:     Head: Normocephalic and atraumatic.  Eyes:     Extraocular Movements: Extraocular movements intact.     Conjunctiva/sclera: Conjunctivae normal.  Neck:     Musculoskeletal: Neck supple.  Cardiovascular:     Rate and Rhythm: Normal rate and regular rhythm.     Heart sounds: Normal heart sounds. No murmur.  Pulmonary:     Effort: Pulmonary effort is normal. No respiratory distress.     Breath sounds: Normal breath sounds. No stridor. No wheezing or rhonchi.  Abdominal:     General: Bowel sounds are normal.     Palpations: Abdomen is soft.     Tenderness: There is  no abdominal tenderness.  Skin:    General: Skin is warm and dry.  Neurological:     Mental Status: She is alert.     Comments: Alert. Able to give a coherent history and answer questions appropriately though does have intermittent aphasia on exam with minimally slurred speech. Cranial Nerves:  II:  pupils equal, round, reactive to light  III,IV, VI: ptosis not present, extra-ocular motions intact bilaterally  V,VII: left facial droop (chronic), facial light touch sensation equal VIII: hearing grossly normal to voice  X: uvula elevates symmetrically  XI: shoulder shrug is symmetric XII: tongue deviates to left Motor:  Normal tone. 2/5 strength to RUE, 3/5 strength to RLE. 4/5 strength to LUE and LLE Sensory: decreased sensation to RUE and RLE Gait: not assessed Negative pronator drift     ED Treatments / Results  Labs (all labs ordered are listed, but only abnormal results are displayed) Labs Reviewed  CBC - Abnormal; Notable for the following components:      Result Value   WBC 3.9 (*)    RBC 5.13 (*)    Hemoglobin 15.8 (*)    HCT 47.5 (*)    All other components within normal limits  DIFFERENTIAL - Abnormal; Notable for the following components:   Neutro Abs 1.5 (*)    All other components within normal limits  COMPREHENSIVE METABOLIC PANEL - Abnormal; Notable for the following components:   Potassium 3.3 (*)    Total Bilirubin 2.0 (*)    All other components within normal limits  BASIC METABOLIC PANEL - Abnormal; Notable for the following components:   Potassium 3.3 (*)    Glucose, Bld 105 (*)    All other components within normal limits  BILIRUBIN, FRACTIONATED(TOT/DIR/INDIR) - Abnormal; Notable for the following components:   Total Bilirubin 2.1 (*)    Bilirubin, Direct 0.3 (*)    Indirect Bilirubin 1.8 (*)    All other components within normal limits  HOMOCYSTEINE - Abnormal; Notable for the following components:   Homocysteine 25.7 (*)    All other components  within normal limits  C-REACTIVE PROTEIN - Abnormal; Notable for the following components:   CRP 1.0 (*)    All other components within normal limits  LIPID PANEL - Abnormal; Notable for the following  components:   Cholesterol 229 (*)    LDL Cholesterol 144 (*)    All other components within normal limits  RAPID URINE DRUG SCREEN, HOSP PERFORMED - Abnormal; Notable for the following components:   Benzodiazepines POSITIVE (*)    All other components within normal limits  PROTIME-INR  APTT  HEMOGLOBIN A1C  MAGNESIUM  LUPUS ANTICOAGULANT PANEL  BETA-2-GLYCOPROTEIN I ABS, IGG/M/A  CARDIOLIPIN ANTIBODIES, IGG, IGM, IGA  ANTINUCLEAR ANTIBODIES, IFA  ANTI-DNA ANTIBODY, DOUBLE-STRANDED  SEDIMENTATION RATE  MTHFR DNA ANALYSIS  PROTHROMBIN GENE MUTATION  VITAMIN B12  TSH  I-STAT CREATININE, ED  CBG MONITORING, ED  I-STAT BETA HCG BLOOD, ED ([facility], [facility], [facility] ONLY)    EKG EKG: NSR, HR 70. QRS 98 ms. QT/QTc: 461/498. LAFB, possible anterior infarct, age indeterminate, Twave abnormality, consider inferolateral ischemia (new).   Radiology No results found.  Procedures Procedures (including critical care time) CRITICAL CARE Performed by: Rodney Booze   Total critical care time: 31 minutes  Critical care time was exclusive of separately billable procedures and treating other patients.  Critical care was necessary to treat or prevent imminent or life-threatening deterioration.  Critical care was time spent personally by me on the following activities: development of treatment plan with patient and/or surrogate as well as nursing, discussions with consultants, evaluation of patient's response to treatment, examination of patient, obtaining history from patient or surrogate, ordering and performing treatments and interventions, ordering and review of laboratory studies, ordering and review of radiographic studies, pulse oximetry and re-evaluation of patient's condition.   Medications  Ordered in ED Medications  aspirin chewable tablet 324 mg (324 mg Oral Given 03/27/19 2244)   stroke: mapping our early stages of recovery book ( Does not apply Given 03/27/19 2200)  potassium chloride SA (K-DUR,KLOR-CON) CR tablet 40 mEq (40 mEq Oral Given 03/27/19 2244)  LORazepam (ATIVAN) injection 1 mg (1 mg Intravenous Given 03/28/19 0343)  iohexol (OMNIPAQUE) 350 MG/ML injection 75 mL (75 mLs Intravenous Contrast Given 03/27/19 2049)  potassium chloride SA (K-DUR,KLOR-CON) CR tablet 40 mEq (40 mEq Oral Given 03/28/19 1347)  magnesium sulfate IVPB 1 g 100 mL (0 g Intravenous Stopped 03/28/19 2159)  iohexol (OMNIPAQUE) 350 MG/ML injection 100 mL (100 mLs Intravenous Contrast Given 03/28/19 1631)     Initial Impression / Assessment and Plan / ED Course  I have reviewed the triage vital signs and the nursing notes.  Pertinent labs & imaging results that were available during my care of the patient were reviewed by me and considered in my medical decision making (see chart for details).      Final Clinical Impressions(s) / ED Diagnoses   Final diagnoses:  Right sided weakness   Patient presenting with strokelike symptoms that started 1 hour prior to arrival.  Complaining of right upper extremity and right lower extremity weakness/numbness.  Also with slurred speech per family and some aphasia on my exam.  Has history of CVA with chronic left-sided deficits.  Stroke initiated at triage.  Patient with right-sided weakness and sensory changes on the right side.  Aphasia on exam.  CBG wnl CBC with leukopenia, consistent with prior.  Hemoglobin elevated, likely due to hemoconcentration. CMP with mild hypokalemia otherwise reassuring. CBG normal.  Coags normal.  CT with no evidence of acute intracranial abnormality. Multiple chronic infarcts, unchanged from 2016.   Dr. Lorraine Lax with neurology evaluated pt at bedside. Recommended against tPA given sxs are not severe. Further recommendations in  consult note including CTA and MRI. Recommended  admission to medicine for stroke w/u.   Discussed case with Dr. Myna Hidalgo who accepts pt for admission.   ED Discharge Orders         Ordered    aspirin EC 81 MG EC tablet  Daily     03/29/19 1353    vitamin B-12 (CYANOCOBALAMIN) 500 MCG tablet  Daily     03/29/19 1353    Ambulatory referral to Neurology    Comments:  Follow up with stroke clinic NP (Jessica Vanschaick or Cecille Rubin, if both not available, consider Dr. Antony Contras, Dr. Bess Harvest, or Dr. Sarina Ill) at [facility] in about 4 weeks.   03/29/19 1349    atorvastatin (LIPITOR) 40 MG tablet  Daily-1800     03/29/19 1353    clopidogrel (PLAVIX) 75 MG tablet  Daily     03/29/19 1353    Increase activity slowly     03/29/19 1353    Diet - low sodium heart healthy     03/29/19 1353    Discharge instructions  Status:  Canceled    Comments:  Take medications as prescribed Follow up with PCP 1-2 weeks to check potassium level, bilirubin Follow up with Dr. [name] as scheduled Home health PT   03/29/19 1353    Call MD for:  persistant dizziness or light-headedness     03/29/19 1353    Discharge instructions    Comments:  recommend aspirin 81 mg and plavix 75 mg daily x 3 weeks, then PLAVIX alone. Follow up with PCP 1-2 weeks to check potassium level, bilirubin Follow up with Dr. [name] as scheduled Home health PT   03/29/19 [place], PA-C 03/27/19 1951    Little, Wenda Overland, MD 03/28/19 2156    Rodney Booze, PA-C 04/12/19 [id number]    Little, Wenda Overland, MD 04/12/19 [phone]

## 2019-03-27 NOTE — ED Triage Notes (Signed)
Pt started having right side weakness at 1730. Pt came POV to ED. Ao x 4. Drift and ataxia to right arm present. Hx of stroke effecting left side with no residual. NIH 2 per Neurology.

## 2019-03-27 NOTE — Progress Notes (Signed)
Pt admitted from ED with stroke like symptoms, pt alert and oriented, denies any pain at this time,safety measures explained and initiated, tele monitor put and verified as well. Pt was however reassured and will continue to monitor, v/s stable. [place]As[place], Marl Se[place] Efe

## 2019-03-28 ENCOUNTER — Observation Stay (HOSPITAL_COMMUNITY): Payer: Medicare Other

## 2019-03-28 ENCOUNTER — Encounter (HOSPITAL_COMMUNITY): Payer: Self-pay | Admitting: Radiology

## 2019-03-28 ENCOUNTER — Inpatient Hospital Stay (HOSPITAL_COMMUNITY): Payer: Medicare Other

## 2019-03-28 ENCOUNTER — Other Ambulatory Visit: Payer: Self-pay

## 2019-03-28 DIAGNOSIS — Z8249 Family history of ischemic heart disease and other diseases of the circulatory system: Secondary | ICD-10-CM | POA: Diagnosis not present

## 2019-03-28 DIAGNOSIS — Z7982 Long term (current) use of aspirin: Secondary | ICD-10-CM | POA: Diagnosis not present

## 2019-03-28 DIAGNOSIS — Z888 Allergy status to other drugs, medicaments and biological substances status: Secondary | ICD-10-CM | POA: Diagnosis not present

## 2019-03-28 DIAGNOSIS — I63412 Cerebral infarction due to embolism of left middle cerebral artery: Secondary | ICD-10-CM

## 2019-03-28 DIAGNOSIS — I63132 Cerebral infarction due to embolism of left carotid artery: Secondary | ICD-10-CM | POA: Diagnosis not present

## 2019-03-28 DIAGNOSIS — E785 Hyperlipidemia, unspecified: Secondary | ICD-10-CM | POA: Diagnosis present

## 2019-03-28 DIAGNOSIS — I6930 Unspecified sequelae of cerebral infarction: Secondary | ICD-10-CM | POA: Diagnosis not present

## 2019-03-28 DIAGNOSIS — R17 Unspecified jaundice: Secondary | ICD-10-CM | POA: Diagnosis present

## 2019-03-28 DIAGNOSIS — E876 Hypokalemia: Secondary | ICD-10-CM | POA: Diagnosis not present

## 2019-03-28 DIAGNOSIS — Q283 Other malformations of cerebral vessels: Secondary | ICD-10-CM | POA: Diagnosis not present

## 2019-03-28 DIAGNOSIS — I6389 Other cerebral infarction: Secondary | ICD-10-CM | POA: Diagnosis not present

## 2019-03-28 DIAGNOSIS — R29702 NIHSS score 2: Secondary | ICD-10-CM | POA: Diagnosis present

## 2019-03-28 DIAGNOSIS — F418 Other specified anxiety disorders: Secondary | ICD-10-CM | POA: Diagnosis present

## 2019-03-28 DIAGNOSIS — I6330 Cerebral infarction due to thrombosis of unspecified cerebral artery: Secondary | ICD-10-CM | POA: Diagnosis present

## 2019-03-28 DIAGNOSIS — I69359 Hemiplegia and hemiparesis following cerebral infarction affecting unspecified side: Secondary | ICD-10-CM | POA: Diagnosis not present

## 2019-03-28 DIAGNOSIS — I672 Cerebral atherosclerosis: Secondary | ICD-10-CM | POA: Diagnosis present

## 2019-03-28 DIAGNOSIS — Z87891 Personal history of nicotine dependence: Secondary | ICD-10-CM | POA: Diagnosis not present

## 2019-03-28 DIAGNOSIS — Z79899 Other long term (current) drug therapy: Secondary | ICD-10-CM | POA: Diagnosis not present

## 2019-03-28 DIAGNOSIS — I7101 Dissection of thoracic aorta: Secondary | ICD-10-CM | POA: Diagnosis not present

## 2019-03-28 DIAGNOSIS — D72819 Decreased white blood cell count, unspecified: Secondary | ICD-10-CM | POA: Diagnosis present

## 2019-03-28 DIAGNOSIS — I712 Thoracic aortic aneurysm, without rupture: Secondary | ICD-10-CM | POA: Diagnosis present

## 2019-03-28 DIAGNOSIS — I700 Atherosclerosis of aorta: Secondary | ICD-10-CM | POA: Diagnosis present

## 2019-03-28 DIAGNOSIS — I639 Cerebral infarction, unspecified: Secondary | ICD-10-CM

## 2019-03-28 DIAGNOSIS — I10 Essential (primary) hypertension: Secondary | ICD-10-CM | POA: Diagnosis not present

## 2019-03-28 DIAGNOSIS — R531 Weakness: Secondary | ICD-10-CM | POA: Diagnosis present

## 2019-03-28 DIAGNOSIS — Z7951 Long term (current) use of inhaled steroids: Secondary | ICD-10-CM | POA: Diagnosis not present

## 2019-03-28 DIAGNOSIS — G9389 Other specified disorders of brain: Secondary | ICD-10-CM | POA: Diagnosis present

## 2019-03-28 DIAGNOSIS — E538 Deficiency of other specified B group vitamins: Secondary | ICD-10-CM | POA: Diagnosis present

## 2019-03-28 DIAGNOSIS — B20 Human immunodeficiency virus [HIV] disease: Secondary | ICD-10-CM | POA: Diagnosis present

## 2019-03-28 DIAGNOSIS — I611 Nontraumatic intracerebral hemorrhage in hemisphere, cortical: Secondary | ICD-10-CM | POA: Diagnosis present

## 2019-03-28 LAB — BASIC METABOLIC PANEL
Anion gap: 10 (ref 5–15)
BUN: 11 mg/dL (ref 6–20)
CO2: 24 mmol/L (ref 22–32)
Calcium: 8.9 mg/dL (ref 8.9–10.3)
Chloride: 106 mmol/L (ref 98–111)
Creatinine, Ser: 0.92 mg/dL (ref 0.44–1.00)
GFR calc Af Amer: >60 mL/min (ref >60)
GFR calc non Af Amer: >60 mL/min (ref >60)
Glucose, Bld: 105 mg/dL — ABNORMAL HIGH (ref 70–99)
Potassium: 3.3 mmol/L — ABNORMAL LOW (ref 3.5–5.1)
Sodium: 140 mmol/L (ref 135–145)

## 2019-03-28 LAB — BILIRUBIN, FRACTIONATED(TOT/DIR/INDIR)
Bilirubin, Direct: 0.3 mg/dL — ABNORMAL HIGH (ref 0.0–0.2)
Indirect Bilirubin: 1.8 mg/dL — ABNORMAL HIGH (ref 0.3–0.9)
Total Bilirubin: 2.1 mg/dL — ABNORMAL HIGH (ref 0.3–1.2)

## 2019-03-28 LAB — TSH: TSH: 3.152 u[IU]/mL (ref 0.350–4.500)

## 2019-03-28 LAB — RAPID URINE DRUG SCREEN, HOSP PERFORMED
Amphetamines: NOT DETECTED
Barbiturates: NOT DETECTED
Benzodiazepines: POSITIVE — AB
Cocaine: NOT DETECTED
Opiates: NOT DETECTED
Tetrahydrocannabinol: NOT DETECTED

## 2019-03-28 LAB — LIPID PANEL
Cholesterol: 229 mg/dL — ABNORMAL HIGH (ref 0–200)
HDL: 55 mg/dL (ref >40)
LDL Cholesterol: 144 mg/dL — ABNORMAL HIGH (ref 0–99)
Total CHOL/HDL Ratio: 4.2 RATIO
Triglycerides: 149 mg/dL (ref <150)
VLDL: 30 mg/dL (ref 0–40)

## 2019-03-28 LAB — SEDIMENTATION RATE: Sed Rate: 6 mm/hr (ref 0–22)

## 2019-03-28 LAB — ECHOCARDIOGRAM LIMITED BUBBLE STUDY
Height: 64 in
Weight: 2585.55 oz

## 2019-03-28 LAB — C-REACTIVE PROTEIN: CRP: 1 mg/dL — ABNORMAL HIGH (ref <1.0)

## 2019-03-28 LAB — MAGNESIUM: Magnesium: 1.8 mg/dL (ref 1.7–2.4)

## 2019-03-28 LAB — VITAMIN B12: Vitamin B-12: 210 pg/mL (ref 180–914)

## 2019-03-28 MED ORDER — POTASSIUM CHLORIDE CRYS ER 20 MEQ PO TBCR
40.0000 meq | EXTENDED_RELEASE_TABLET | Freq: Once | ORAL | Status: AC
  Administered 2019-03-28: 14:00:00 40 meq via ORAL

## 2019-03-28 MED ORDER — ATORVASTATIN CALCIUM 40 MG PO TABS
40.0000 mg | ORAL_TABLET | Freq: Every day | ORAL | Status: DC
  Administered 2019-03-28: 22:00:00 40 mg via ORAL
  Filled 2019-03-28: qty 1

## 2019-03-28 MED ORDER — VITAMIN B-12 100 MCG PO TABS
500.0000 ug | ORAL_TABLET | Freq: Every day | ORAL | Status: DC
  Administered 2019-03-28 – 2019-03-29 (×2): 500 ug via ORAL
  Filled 2019-03-28 (×2): qty 5

## 2019-03-28 MED ORDER — IOHEXOL 350 MG/ML SOLN
100.0000 mL | Freq: Once | INTRAVENOUS | Status: AC | PRN
  Administered 2019-03-28: 17:00:00 100 mL via INTRAVENOUS

## 2019-03-28 MED ORDER — MAGNESIUM SULFATE IN D5W 1-5 GM/100ML-% IV SOLN
1.0000 g | Freq: Once | INTRAVENOUS | Status: AC
  Administered 2019-03-28: 14:00:00 1 g via INTRAVENOUS
  Filled 2019-03-28: qty 100

## 2019-03-28 NOTE — Progress Notes (Signed)
STROKE TEAM PROGRESS NOTE   SUBJECTIVE (INTERVAL HISTORY) No family is at the bedside.  Patient lying in bed, awake alert, recounted HPI with me.  She stated that she had a stroke in 2012, left with right facial weakness and left hand weakness.  This time she had right arm and hand weakness and numbness.     OBJECTIVE Vitals:   03/27/19 2330 03/28/19 0130 03/28/19 0330 03/28/19 0530  BP: (!) 131/101 127/77 (!) 136/91 (!) 119/91  Pulse: 61 62 65 69  Resp: 20 (!) 21 18 18   Temp: 97.8 F (36.6 C) 98.5 F (36.9 C) 98.3 F (36.8 C) 98.2 F (36.8 C)  TempSrc: Oral Oral Oral Oral  SpO2: 93% 99% 100% 99%  Weight:      Height:        CBC:  Recent Labs  Lab 03/27/19 1834  WBC 3.9*  NEUTROABS 1.5*  HGB 15.8*  HCT 47.5*  MCV 92.6  PLT 742    Basic Metabolic Panel:  Recent Labs  Lab 03/27/19 1834 03/27/19 1844  NA 140  --   K 3.3*  --   CL 101  --   CO2 26  --   GLUCOSE 87  --   BUN 10  --   CREATININE 0.95 1.00  CALCIUM 9.6  --     Lipid Panel:     Component Value Date/Time   CHOL 192 01/23/2017 1628   TRIG 109 01/23/2017 1628   HDL 61 01/23/2017 1628   CHOLHDL 3.1 01/23/2017 1628   VLDL 22 01/23/2017 1628   LDLCALC 109 (H) 01/23/2017 1628   HgbA1c:  Lab Results  Component Value Date   HGBA1C  01/18/2011    5.1 (NOTE)                                                                       According to the ADA Clinical Practice Recommendations for 2011, when HbA1c is used as a screening test:   >=6.5%   Diagnostic of Diabetes Mellitus           (if abnormal result  is confirmed)  5.7-6.4%   Increased risk of developing Diabetes Mellitus  References:Diagnosis and Classification of Diabetes Mellitus,Diabetes VZDG,3875,64(PPIRJ 1):S62-S69 and Standards of Medical Care in         Diabetes - 2011,Diabetes Care,2011,34  (Suppl 1):S11-S61.   Urine Drug Screen:     Component Value Date/Time   LABOPIA NONE DETECTED 06/01/2011 0915   COCAINSCRNUR NONE DETECTED  06/01/2011 0915   LABBENZ NONE DETECTED 06/01/2011 0915   AMPHETMU NONE DETECTED 06/01/2011 0915   THCU NONE DETECTED 06/01/2011 0915   LABBARB NONE DETECTED 06/01/2011 0915    Alcohol Level     Component Value Date/Time   ETH <11 06/01/2011 0801    IMAGING  Ct Angio Head W Or Wo Contrast Ct Angio Neck W And/or Wo Contrast 03/27/2019 IMPRESSION:  Mild intracranial atherosclerosis. No large vessel occlusion or significant stenosis in the head and neck.    Mr Brain Wo Contrast 03/28/2019   MRI HEAD  IMPRESSION:  1. Acute ischemic nonhemorrhagic posterior left frontal cortical infarct, left MCA distribution. No associated mass effect.  2. No other acute intracranial abnormality.  3. Multiple chronic  infarcts involving the frontal and parietal lobes bilaterally as well as the right cerebellum.  4. Underlying mild chronic microvascular ischemic disease.    Ct Head Code Stroke Wo Contrast 03/27/2019 IMPRESSION:  1. No evidence of acute intracranial abnormality.  2. ASPECTS is 10.  3. Multiple chronic infarcts, unchanged from 2016.    Transthoracic Echocardiogram   1. The left ventricle has hyperdynamic systolic function, with an ejection fraction of >65%. The cavity size was normal. Left ventricular diastolic Doppler parameters are consistent with impaired relaxation.  2. The right ventricle has normal systolc function. The cavity was normal. There is no increase in right ventricular wall thickness.  3. Negative bubble study for intracardiac shunt.  4. No evidence of mitral valve stenosis.  5. No stenosis of the aortic valve.  6. The aortic root is normal in size and structure.  7. There is mild dilatation of the ascending aorta measuring 40 mm.  8. Significant plaque noted in the visualized portion of the abdominal aorta, partially mobile. Extensive whats looks to be complex placque in the descending aorta and portions of the abdominal aorta. Cannot completely exclude possible  dissection flap. Would recommend CTA thoracic aorta to further evaluate  9. Pulmonary hypertension is indeterminant, inadequate TR jet. 10. The interatrial septum was not well visualized.   EKG - SR rate 70 BPM. (See cardiology reading for complete details)    PHYSICAL EXAM  Temp:  [97.6 F (36.4 C)-98.7 F (37.1 C)] 98.2 F (36.8 C) (04/12 0530) Pulse Rate:  [54-71] 69 (04/12 0530) Resp:  [14-21] 18 (04/12 0530) BP: (119-158)/(77-136) 119/91 (04/12 0530) SpO2:  [93 %-100 %] 99 % (04/12 0530) Weight:  [73.3 kg-74.4 kg] 73.3 kg (04/11 2129)  General - Well nourished, well developed, in no apparent distress.  Ophthalmologic - fundi not visualized due to noncooperation.  Cardiovascular - Regular rate and rhythm.  Mental Status -  Level of arousal and orientation to time, place, and person were intact. Language including expression, naming, repetition, comprehension was assessed and found intact. Fund of Knowledge was assessed and was intact.  Cranial Nerves II - XII - II - Visual field intact OU. III, IV, VI - Extraocular movements intact. V - Facial sensation intact bilaterally. VII - left facial droop. VIII - Hearing & vestibular intact bilaterally. X - Palate elevates symmetrically. XI - Chin turning & shoulder shrug intact bilaterally. XII - Tongue protrusion intact.  Motor Strength - The patient's strength was normal in all extremities except pronator drift exam on the right, with bilateral hand dexterity difficulty.  Bulk was normal and fasciculations were absent.   Motor Tone - Muscle tone was assessed at the neck and appendages and was normal.  Reflexes - The patient's reflexes were symmetrical in all extremities and she had no pathological reflexes.  Sensory - Light touch, temperature/pinprick were assessed and were symmetrical.    Coordination - The patient had normal movements in the left hand with no ataxia or dysmetria.  However, right finger-to-nose ataxic.  Tremor was absent.  Gait and Station - deferred.    ASSESSMENT/PLAN: Ms. Rosalia Mcavoy is a 53 y.o. female with history of seizures, HIV, HTN, HLD, CVA ( 01/2011 with residual left side deficits) presenting with RUE numbness, tingling and weakness. She did not receive IV t-PA due to mild deficits.  Stroke: posterior left frontal cortical infarct, left MCA distribution - embolic pattern - unknown source  Resultant right arm pronator drift with dexterity difficulty of the right hand  CT head - Multiple chronic infarcts, unchanged from 2016.   MRI head - Acute posterior left frontal cortical infarct, left MCA distribution. Multiple chronic infarcts involving the frontal and parietal lobes bilaterally as well as the right cerebellum.   CTA H&N - Mild intracranial atherosclerosis. No large vessel occlusion or significant stenosis in the head and neck.   2D Echo EF > 65%, however extensive complex plaque in the descending aorta and concerning for thoracic aorta possible dissection  TEE 01/2011 no PFO  Recommend loop recorder placement to rule out afib - plan for Monday (Trish notified)  LDL - 144  HgbA1c - pending  Hypercoagulable / autoimmune labs - pending  RPR neg in 12/2018  UDS - pending  VTE prophylaxis - Lovenox  Diet  - Heart healthy with thin liquids.  aspirin 81 mg daily prior to admission, now on aspirin 325 mg daily.   Patient counseled to be compliant with her antithrombotic medications  Ongoing aggressive stroke risk factor management  Therapy recommendations:  pending  Disposition:  Pending  History of stroke  01/2011 status post TPA.  MRI showed right frontoparietal MCA acute infarct and chronic right cerebellum infarct.  MRA negative.  EF 55 to 60%.  Due to embolic pattern, TEE done which showed no PFO.  A1c 5.1 LDL 112, patient was discharged with aspirin and Zocor.  MRI this admission showed right frontal and left parietal as well as right cerebellum  chronic infarcts, indicating silent embolic strokes  Questionable thoracic aorta dissection  TTE concerning for extensive complex plaque in the descending aorta and concerning for thoracic aorta or possible dissection  CTA chest aorta are pending  Likely not related to strokes  Continue aspirin for now  HIV  On HAART therapy  RPR negative in 12/2018  CD4 400 in 12/2018  HIV viral load nondetectable in 12/2018  Follow-up with ID  Hypertension  Stable . Permissive hypertension (OK if < 220/120) but gradually normalize in 5-7 days . Long-term BP goal normotensive  Hyperlipidemia  Lipid lowering medication PTA:  none  LDL 144, goal < 70  Current lipid lowering medication: Lipitor 40  Continue statin at discharge  Bilateral ulnar neuropathy  04/2012 considered bilateral ulnar neuropathy  EMG in 2014 consistent with left ulnar neuropathy  06/2015 followed with Dr. Felecia Shelling, EMG showed right ulnar neuropathy  03/2017 follow with Dr. Tomi Likens, EMG showed right ulnar neuropathy and right median neuropathy  Other Stroke Risk Factors  Former cigarette smoker - quit 3 years ago  ETOH use, advised to drink no more than 1 alcoholic beverage per day.  Other Active Problems  Hypokalemia - 3.3 - supplemented  Leukocytopenia due to HIV - 3.9 (neutrophils 1.5)  B12 deficiency - on supplement B12 500 mcg daily   Hospital day # 0  Rosalin Hawking, MD PhD Stroke Neurology 03/28/2019 3:08 PM  I spent  35 minutes in total face-to-face time with the patient, more than 50% of which was spent in counseling and coordination of care, reviewing test results, images and medication, and discussing the diagnosis of recurrent embolic strokes, HIV, questionable thoracic aorta dissection, B12 deficiency, treatment plan and potential prognosis. This patient's care requiresreview of multiple databases, neurological assessment, discussion with family, other specialists and medical decision making of  high complexity.   To contact Stroke Continuity provider, please refer to [email or web address] After hours, contact General Neurology

## 2019-03-28 NOTE — Progress Notes (Signed)
  Echocardiogram 2D Echocardiogram has been performed.  A limited echocardiogram was performed in accordance with the Director's protocol to limit patient to technician exposure during [place].  Madelaine Etienne 03/28/2019, 9:47 AM

## 2019-03-28 NOTE — Evaluation (Signed)
Occupational Therapy Evaluation Patient Details Name: Abigail Medina MRN: [id number] DOB: 02/29/1966 Today's Date: 03/28/2019    History of Present Illness Pt is a 53 y/o female presenting with weakness and poor cooordination of R UE. CT negative for acute findings, MRI reveals Acute ischemic nonhemorrhagic posterior left frontal cortical.  Known history of CVA with residual L sided weakness, PMH also includes: HTN, HIV, depression with anxiety, vision abnormalities.    Clinical Impression   PTA patient independent.  Admitted for above and limited by impaired balance, R sided dominant UE weakness and poor coordination, decreased activity tolerance and impaired cognition.  Noted patient received ativan this morning, and is lethargic throughout session.  She is disoriented to month, but able to follow commands throughout session; recommend further functional cognitive assessment. Patient requires min assist for grooming and UB ADLs, mod assist for LB ADLs and min guard for basic transfers.  Limited eval, as ECHO arrived for further testing.  Based on performance today, patient will benefit from continued OT services while admitted and after dc at [facility] level in order to optimize independence and safety with ADLs/mobility.   Follow Up Recommendations  Home health OT;Supervision/Assistance - 24 hour    Equipment Recommendations  3 in 1 bedside commode    Recommendations for Other Services       Precautions / Restrictions Restrictions Weight Bearing Restrictions: No      Mobility Bed Mobility Overal bed mobility: Needs Assistance Bed Mobility: Sit to Supine       Sit to supine: Supervision   General bed mobility comments: no assist required   Transfers Overall transfer level: Needs assistance   Transfers: Sit to/from Stand Sit to Stand: Min guard         General transfer comment: min guard for safety and balance, too several steps to [facility]     Balance Overall balance  assessment: Mild deficits observed, not formally tested                                         ADL either performed or assessed with clinical judgement   ADL Overall ADL's : Needs assistance/impaired     Grooming: Minimal assistance;Sitting   Upper Body Bathing: Minimal assistance;Standing   Lower Body Bathing: Moderate assistance;Sit to/from stand   Upper Body Dressing : Minimal assistance;Sitting   Lower Body Dressing: Moderate assistance;Sit to/from stand Lower Body Dressing Details (indicate cue type and reason): requires assist for socks, min guard sit<>stand  Toilet Transfer: Magazine features editor Details (indicate cue type and reason): simulated in room          Functional mobility during ADLs: Min guard General ADL Comments: pt limited by lethargy, decreased functional use of R UE       Vision Baseline Vision/History: Wears glasses Wears Glasses: Reading only Patient Visual Report: No change from baseline Additional Comments: further assessment required     Perception Perception Perception Tested?: No   Praxis      Pertinent Vitals/Pain       Hand Dominance Right   Extremity/Trunk Assessment Upper Extremity Assessment Upper Extremity Assessment: RUE deficits/detail;LUE deficits/detail RUE Deficits / Details: grossly 3+/5 MMT, dysmetric, impaired sensation and proprioception  RUE Sensation: decreased light touch;decreased proprioception RUE Coordination: decreased fine motor;decreased gross motor LUE Deficits / Details: residual deficits from prior CVA, grossly 3+/5 and dysmetric but functional    Lower Extremity Assessment  Lower Extremity Assessment: Defer to PT evaluation       Communication Communication Communication: No difficulties   Cognition Arousal/Alertness: Lethargic;Suspect due to medications Behavior During Therapy: Flat affect Overall Cognitive Status: Difficult to assess                                  General Comments: Pt able to follow commands, disoriented to month (reports feb), further assessment of cognition recommended     General Comments       Exercises     Shoulder Instructions      Home Living Family/patient expects to be discharged to:: Private residence Living Arrangements: Children;Other relatives Available Help at Discharge: Family Type of Home: House Home Access: Stairs to enter Technical brewer of Steps: 1   Home Layout: One level     Bathroom Shower/Tub: Chief Strategy Officer: None          Prior Functioning/Environment Level of Independence: Independent                 OT Problem List: Decreased strength;Decreased activity tolerance;Impaired balance (sitting and/or standing);Decreased coordination;Decreased cognition;Impaired vision/perception;Decreased safety awareness;Decreased knowledge of precautions;Decreased knowledge of use of DME or AE;Impaired UE functional use;Impaired sensation      OT Treatment/Interventions: Self-care/ADL training;Neuromuscular education;DME and/or AE instruction;Therapeutic activities;Cognitive remediation/compensation;Visual/perceptual remediation/compensation;Patient/family education;Balance training    OT Goals(Current goals can be found in the care plan section) Acute Rehab OT Goals Patient Stated Goal: none stated Time For Goal Achievement: 04/11/19 Potential to Achieve Goals: Good  OT Frequency: Min 3X/week   Barriers to D/C:            Co-evaluation              AM-PAC OT "6 Clicks" Daily Activity     Outcome Measure Help from another person eating meals?: A Little Help from another person taking care of personal grooming?: A Little Help from another person toileting, which includes using toliet, bedpan, or urinal?: A Little Help from another person bathing (including washing, rinsing, drying)?: A Lot Help from another person to put on and taking off regular  upper body clothing?: A Little Help from another person to put on and taking off regular lower body clothing?: A Lot 6 Click Score: 16   End of Session Nurse Communication: Mobility status  Activity Tolerance: Patient tolerated treatment well Patient left: in bed;with call bell/phone within reach;Other (comment)(ECHO present)  OT Visit Diagnosis: Muscle weakness (generalized) (M62.81);Other symptoms and signs involving the nervous system (R29.898)                Time: 8127-5170 OT Time Calculation (min): 8 min Charges:  OT General Charges $OT Visit: 1 Visit OT Evaluation $OT Eval Moderate Complexity: Mignon, OT Acute Rehabilitation Services Pager [phone] Office [phone]   Delight Stare 03/28/2019, 12:54 PM

## 2019-03-28 NOTE — Progress Notes (Signed)
Progress Note    Abigail Medina  KCL:[id number] DOB: 09/05/1966  DOA: 03/27/2019 PCP: Lanae Boast, FNP    Brief Narrative:     Medical records reviewed and are as summarized below:  Abigail Medina is an 53 y.o. female with medical history significant for right MCA stroke, HIV (CD4 400 and VL undetectable in January), hypertension, and depression with anxiety, now presenting to the emergency department for evaluation of right-sided weakness.  Patient had been in her usual state of health with no recent illness, having an uneventful day until approximately 5:30 PM when she noted paresthesias involving the distal right upper extremity.  She also reports weakness and loss of coordination involving the right arm.  She has residual left-sided numbness and some weakness from a prior CVA, but reports that the right-sided symptoms are new.    Assessment/Plan:   Principal Problem:   Acute focal neurological deficit Active Problems:   Human immunodeficiency virus (HIV) disease ([place])   Essential hypertension   Hemiplegia, late effect of cerebrovascular disease ([place])   Depression with anxiety   Hypokalemia   Hyperbilirubinemia   History of CVA with residual deficit   Cerebral thrombosis with cerebral infarction   CVA (cerebral vascular accident) ([place])  Acute CVA  -neurology consult -MRI: Acute ischemic nonhemorrhagic posterior left frontal cortical infarct, left MCA distribution. No associated mass effect -LDL: 144 -HgbA1c pending -echo/carotids -ASA   HIV  - CD4 400 and VL undetectable in January 2020  - Continue Tivicay and Descovy    Hypertension  - Permissive hypertension in acute-phase suspected ischemic CVA    Depression with anxiety  - Continue Paxil and trazodone     Hypokalemia  - replete  Hyperbilirubinemia  - outpatient follow up  Low/normal B12 -replete   Family Communication/Anticipated D/C date and plan/Code Status   DVT prophylaxis: Lovenox  ordered. Code Status: Full Code.  Family Communication:  Disposition Plan:    Medical Consultants:    Neuro     Subjective:   Working with PT  Objective:    Vitals:   03/27/19 2330 03/28/19 0130 03/28/19 0330 03/28/19 0530  BP: (!) 131/101 127/77 (!) 136/91 (!) 119/91  Pulse: 61 62 65 69  Resp: 20 (!) 21 18 18   Temp: 97.8 F (36.6 C) 98.5 F (36.9 C) 98.3 F (36.8 C) 98.2 F (36.8 C)  TempSrc: Oral Oral Oral Oral  SpO2: 93% 99% 100% 99%  Weight:      Height:        Intake/Output Summary (Last 24 hours) at 03/28/2019 1229 Last data filed at 03/28/2019 0351 Gross per 24 hour  Intake 646.8 ml  Output --  Net 646.8 ml   Filed Weights   03/27/19 1800 03/27/19 2129  Weight: 74.4 kg 73.3 kg    Exam: Getting up with therapy, NAD rrr No increased work of breathing  Data Reviewed:   I have personally reviewed following labs and imaging studies:  Labs: Labs show the following:   Basic Metabolic Panel: Recent Labs  Lab 03/27/19 1834 03/27/19 1844 03/28/19 1012  NA 140  --  140  K 3.3*  --  3.3*  CL 101  --  106  CO2 26  --  24  GLUCOSE 87  --  105*  BUN 10  --  11  CREATININE 0.95 1.00 0.92  CALCIUM 9.6  --  8.9  MG  --   --  1.8   GFR Estimated Creatinine Clearance: 67.7 mL/min (by C-G  formula based on SCr of 0.92 mg/dL). Liver Function Tests: Recent Labs  Lab 03/27/19 1834 03/28/19 1012  AST 25  --   ALT 23  --   ALKPHOS 77  --   BILITOT 2.0* 2.1*  PROT 7.4  --   ALBUMIN 4.0  --    No results for input(s): LIPASE, AMYLASE in the last 168 hours. No results for input(s): AMMONIA in the last 168 hours. Coagulation profile Recent Labs  Lab 03/27/19 1834  INR 0.9    CBC: Recent Labs  Lab 03/27/19 1834  WBC 3.9*  NEUTROABS 1.5*  HGB 15.8*  HCT 47.5*  MCV 92.6  PLT 177   Cardiac Enzymes: No results for input(s): CKTOTAL, CKMB, CKMBINDEX, TROPONINI in the last 168 hours. BNP (last 3 results) No results for input(s): PROBNP  in the last 8760 hours. CBG: Recent Labs  Lab 03/27/19 1856  GLUCAP 97   D-Dimer: No results for input(s): DDIMER in the last 72 hours. Hgb A1c: No results for input(s): HGBA1C in the last 72 hours. Lipid Profile: Recent Labs    03/28/19 1012  CHOL 229*  HDL 55  LDLCALC 144*  TRIG 149  CHOLHDL 4.2   Thyroid function studies: Recent Labs    03/28/19 1012  TSH 3.152   Anemia work up: Recent Labs    03/28/19 1012  VITAMINB12 210   Sepsis Labs: Recent Labs  Lab 03/27/19 1834  WBC 3.9*    Microbiology No results found for this or any previous visit (from the past 240 hour(s)).  Procedures and diagnostic studies:  Ct Angio Head W Or Wo Contrast  Result Date: 03/27/2019 CLINICAL DATA:  Right-sided weakness. EXAM: CT ANGIOGRAPHY HEAD AND NECK TECHNIQUE: Multidetector CT imaging of the head and neck was performed using the standard protocol during bolus administration of intravenous contrast. Multiplanar CT image reconstructions and MIPs were obtained to evaluate the vascular anatomy. Carotid stenosis measurements (when applicable) are obtained utilizing NASCET criteria, using the distal internal carotid diameter as the denominator. CONTRAST:  42mL OMNIPAQUE IOHEXOL 350 MG/ML SOLN COMPARISON:  Head MRA 07/19/2015. FINDINGS: CTA NECK FINDINGS Aortic arch: Normal variant aortic arch branching pattern with common origin of the brachiocephalic and left common carotid arteries. Widely patent arch vessel origins. Right carotid system: Patent without evidence of stenosis or dissection. Tortuous distal cervical ICA. Left carotid system: Patent without evidence of stenosis or dissection. Tortuous distal cervical ICA. Vertebral arteries: Patent with the left being mildly dominant. No evidence of stenosis or dissection. Skeleton: Mild lower cervical disc degeneration. Other neck: No mass or enlarged lymph nodes. Upper chest: Clear lung apices. Review of the MIP images confirms the above  findings CTA HEAD FINDINGS Anterior circulation: The internal carotid arteries are patent from skull base to carotid termini with mild atherosclerotic plaque bilaterally not resulting in significant stenosis. ACAs and MCAs are patent without evidence of proximal branch occlusion or significant stenosis. No aneurysm is identified. Posterior circulation: The intracranial vertebral arteries are widely patent to the basilar. Patent PICAs and SCAs are seen bilaterally. The basilar artery is widely patent. There are small posterior communicating arteries bilaterally. PCAs are patent without evidence of significant stenosis. No aneurysm is identified. Venous sinuses: As permitted by contrast timing, patent. Anatomic variants: None. Delayed phase: No abnormal enhancement. Review of the MIP images confirms the above findings IMPRESSION: Mild intracranial atherosclerosis. No large vessel occlusion or significant stenosis in the head and neck. Electronically Signed   By: Seymour Bars.D.  On: 03/27/2019 21:15   Ct Angio Neck W And/or Wo Contrast  Result Date: 03/27/2019 CLINICAL DATA:  Right-sided weakness. EXAM: CT ANGIOGRAPHY HEAD AND NECK TECHNIQUE: Multidetector CT imaging of the head and neck was performed using the standard protocol during bolus administration of intravenous contrast. Multiplanar CT image reconstructions and MIPs were obtained to evaluate the vascular anatomy. Carotid stenosis measurements (when applicable) are obtained utilizing NASCET criteria, using the distal internal carotid diameter as the denominator. CONTRAST:  88mL OMNIPAQUE IOHEXOL 350 MG/ML SOLN COMPARISON:  Head MRA 07/19/2015. FINDINGS: CTA NECK FINDINGS Aortic arch: Normal variant aortic arch branching pattern with common origin of the brachiocephalic and left common carotid arteries. Widely patent arch vessel origins. Right carotid system: Patent without evidence of stenosis or dissection. Tortuous distal cervical ICA. Left carotid  system: Patent without evidence of stenosis or dissection. Tortuous distal cervical ICA. Vertebral arteries: Patent with the left being mildly dominant. No evidence of stenosis or dissection. Skeleton: Mild lower cervical disc degeneration. Other neck: No mass or enlarged lymph nodes. Upper chest: Clear lung apices. Review of the MIP images confirms the above findings CTA HEAD FINDINGS Anterior circulation: The internal carotid arteries are patent from skull base to carotid termini with mild atherosclerotic plaque bilaterally not resulting in significant stenosis. ACAs and MCAs are patent without evidence of proximal branch occlusion or significant stenosis. No aneurysm is identified. Posterior circulation: The intracranial vertebral arteries are widely patent to the basilar. Patent PICAs and SCAs are seen bilaterally. The basilar artery is widely patent. There are small posterior communicating arteries bilaterally. PCAs are patent without evidence of significant stenosis. No aneurysm is identified. Venous sinuses: As permitted by contrast timing, patent. Anatomic variants: None. Delayed phase: No abnormal enhancement. Review of the MIP images confirms the above findings IMPRESSION: Mild intracranial atherosclerosis. No large vessel occlusion or significant stenosis in the head and neck. Electronically Signed   By: Logan Bores M.D.   On: 03/27/2019 21:15   Mr Brain Wo Contrast  Addendum Date: 03/28/2019   ADDENDUM REPORT: 03/28/2019 07:41 ADDENDUM: This addendum includes the initial findings and impression for this exam. This study was inadvertently signed prior to being dictated by mistake. MRI HEAD FINDINGS: Brain: Generalized age-related cerebral volume loss. Patchy T2/FLAIR hyperintensity within the periventricular and deep white matter both cerebral hemispheres, most consistent with chronic small vessel ischemic disease, mild in nature. Multifocal areas of encephalomalacia with gliosis seen involving the  bilateral frontal and parietal lobes, compatible with remote ischemic infarcts. Small remote right cerebellar infarct. Confluent area of restricted diffusion measuring approximately 3.4 cm in size seen involving the posterior left frontal lobe, consistent with acute left MCA territory infarct (series 5, image 80). Involvement of the left precentral gyrus. No associated hemorrhage or mass effect. No other evidence for acute or subacute ischemia. Gray-white matter differentiation otherwise maintained. No evidence for acute or chronic intracranial hemorrhage. No mass lesion, midline shift or mass effect. No hydrocephalus. No extra-axial fluid collection. Pituitary gland suprasellar region normal. No abnormal foci of restricted diffusion to suggest acute or subacute ischemia. Gray-white matter differentiation well maintained. No encephalomalacia to suggest chronic infarction. No foci of susceptibility artifact to suggest acute or chronic intracranial hemorrhage. No mass lesion, midline shift or mass effect. No hydrocephalus. No extra-axial fluid collection. Major dural sinuses are grossly patent. Pituitary gland and suprasellar region are normal. Midline structures intact and normal. Vascular: Major intracranial vascular flow voids are maintained Skull and upper cervical spine: Craniocervical junction within normal limits.  Upper cervical spine normal. Bone marrow signal intensity within normal limits. No scalp soft tissue abnormality. Sinuses/Orbits: Globes and orbital soft tissues within normal limits. Mild scattered mucosal thickening within the ethmoidal air cells. Paranasal sinuses are clear. No mastoid effusion. Other: None. MRI HEAD IMPRESSION: 1. Acute ischemic nonhemorrhagic posterior left frontal cortical infarct, left MCA distribution. No associated mass effect. 2. No other acute intracranial abnormality. 3. Multiple chronic infarcts involving the frontal and parietal lobes bilaterally as well as the right  cerebellum. 4. Underlying mild chronic microvascular ischemic disease. Electronically Signed   By: Jeannine Boga M.D.   On: 03/28/2019 07:41   Result Date: 03/28/2019 EXAM: MRI HEAD WITHOUT CONTRAST TECHNIQUE: Multiplanar, multiecho pulse sequences of the brain and surrounding structures were obtained without intravenous contrast. COMPARISON:  None. FINDINGS: Brain: Vascular: Skull and upper cervical spine: Sinuses/Orbits: Other: Electronically Signed: By: Jeannine Boga M.D. On: 03/28/2019 05:04   Ct Head Code Stroke Wo Contrast  Result Date: 03/27/2019 CLINICAL DATA:  Code stroke.  Right-sided weakness. EXAM: CT HEAD WITHOUT CONTRAST TECHNIQUE: Contiguous axial images were obtained from the base of the skull through the vertex without intravenous contrast. COMPARISON:  Brain MRI 07/06/2015 FINDINGS: Brain: There is no evidence of acute infarct, intracranial hemorrhage, mass, midline shift, or extra-axial fluid collection. Chronic small to moderate-sized posterior right frontal and left parietal infarcts, a small chronic left frontal and right parietal infarcts, and a small chronic right cerebellar infarct are unchanged. Hypodensities elsewhere in the cerebral white matter bilaterally are nonspecific but compatible with mild chronic small vessel ischemic disease. There is mild cerebral atrophy. Vascular: Calcified atherosclerosis at the skull base. No hyperdense vessel. Skull: No fracture 5 mm lucency in the left frontoparietal skull at the vertex, also present in 2008 and considered benign. Sinuses/Orbits: Mild mucosal thickening in the right frontal, bilateral ethmoid, and left sphenoid sinuses. Clear mastoid air cells. Unremarkable orbits. Other: None. ASPECTS [facility] Stroke Program Early CT Score) - Ganglionic level infarction (caudate, lentiform nuclei, internal capsule, insula, M1-M3 cortex): 7 - Supraganglionic infarction (M4-M6 cortex): 3 Total score (0-10 with 10 being normal): 10  IMPRESSION: 1. No evidence of acute intracranial abnormality. 2. ASPECTS is 10. 3. Multiple chronic infarcts, unchanged from 2016. These results were communicated to Dr. Lorraine Lax at 6:52 pm on 03/27/2019 by text page via the [facility] messaging system. Electronically Signed   By: Logan Bores M.D.   On: 03/27/2019 18:52    Medications:    aspirin  300 mg Rectal Daily   Or   aspirin  325 mg Oral Daily   dolutegravir  50 mg Oral Daily   emtricitabine-tenofovir AF  1 tablet Oral Daily   enoxaparin (LOVENOX) injection  40 mg Subcutaneous Q24H   gabapentin  400 mg Oral TID   PARoxetine  30 mg Oral Daily   sodium chloride flush  3 mL Intravenous Once   Continuous Infusions:   LOS: 0 days   Geradine Girt  [facility]   How to contact the [facility] Attending or Consulting provider [place] or covering provider during after hours [place], for this patient?  1. Check the care team in [facility] and look for a) attending/consulting [facility] provider listed and b) the [facility] team listed 2. Log into [email or web address] and use [place]'s universal password to access. If you do not have the password, please contact the hospital operator. 3. Locate the [facility] provider you are looking for under [facility] and page to a number that you  can be directly reached. 4. If you still have difficulty reaching the provider, please page the [facility] (Director on Call) for the Hospitalists listed on amion for assistance.  03/28/2019, 12:29 PM

## 2019-03-28 NOTE — Progress Notes (Deleted)
Physical Therapy Treatment Patient Details Name: Abigail Medina MRN: [id number] DOB: [birth year] Today's Date: 03/28/2019    History of Present Illness Pt is a 53 y/o female presenting with weakness and poor cooordination of R UE. CT negative for acute findings, MRI reveals Acute ischemic nonhemorrhagic posterior left frontal cortical.  Known history of CVA with residual L sided weakness, PMH also includes: HTN, HIV, depression with anxiety, vision abnormalities.     PT Comments    Pt admitted with above. Pt presenting with decreased attention/awareness, coordination impairments, RUE weakness, and balance deficits. Pt drowsy throughout session, and evaluation likely impacted by Ativan given this morning. Requiring min assist for ambulating x 100 feet. BP 117/83. Recommending HHPT to maximize functional independence and safety. Will re-assess pt acutely to address deficits and progress mobility.     Follow Up Recommendations  Home health PT;Supervision for mobility/OOB     Equipment Recommendations  None recommended by PT    Recommendations for Other Services       Precautions / Restrictions Precautions Precautions: Fall Restrictions Weight Bearing Restrictions: No    Mobility  Bed Mobility Overal bed mobility: Modified Independent Bed Mobility: Sit to Supine       Sit to supine: Supervision   General bed mobility comments: no assist required   Transfers Overall transfer level: Needs assistance   Transfers: Sit to/from Stand Sit to Stand: Min guard         General transfer comment: min guard for safety and balance  Ambulation/Gait Ambulation/Gait assistance: Min assist Gait Distance (Feet): 100 Feet Assistive device: None Gait Pattern/deviations: Step-through pattern;Scissoring;Drifts right/left Gait velocity: decr   General Gait Details: Pt requiring min assist for stability, with occasional scissoring likely due to decreased focus/attention/level of  arousal   Stairs             Wheelchair Mobility    Modified Rankin (Stroke Patients Only) Modified Rankin (Stroke Patients Only) Pre-Morbid Rankin Score: No symptoms Modified Rankin: Moderately severe disability     Balance Overall balance assessment: Mild deficits observed, not formally tested                                          Cognition Arousal/Alertness: Lethargic;Suspect due to medications Behavior During Therapy: Flat affect Overall Cognitive Status: Difficult to assess                                 General Comments: Pt able to follow commands, disoriented to month (reports feb), further assessment of cognition recommended        Exercises      General Comments        Pertinent Vitals/Pain Pain Assessment: No/denies pain    Home Living Family/patient expects to be discharged to:: Private residence Living Arrangements: Children;Other relatives Available Help at Discharge: Family Type of Home: House Home Access: Stairs to enter   Home Layout: One level Home Equipment: None      Prior Function Level of Independence: Independent          PT Goals (current goals can now be found in the care plan section) Acute Rehab PT Goals Patient Stated Goal: none stated PT Goal Formulation: With patient Time For Goal Achievement: 04/11/19 Potential to Achieve Goals: Good    Frequency    Min 4X/week  PT Plan      Co-evaluation              AM-PAC PT "6 Clicks" Mobility   Outcome Measure  Help needed turning from your back to your side while in a flat bed without using bedrails?: None Help needed moving from lying on your back to sitting on the side of a flat bed without using bedrails?: None Help needed moving to and from a bed to a chair (including a wheelchair)?: A Little Help needed standing up from a chair using your arms (e.g., wheelchair or bedside chair)?: A Little Help needed to walk in  hospital room?: A Little Help needed climbing 3-5 steps with a railing? : A Lot 6 Click Score: 19    End of Session Equipment Utilized During Treatment: Gait belt Activity Tolerance: Patient tolerated treatment well Patient left: in bed;with call bell/phone within reach;Other (comment)(with OT) Nurse Communication: Mobility status PT Visit Diagnosis: Unsteadiness on feet (R26.81);Other symptoms and signs involving the nervous system (R29.898)     Time: 5638-9373 PT Time Calculation (min) (ACUTE ONLY): 23 min  Charges:  $Therapeutic Activity: 8-22 mins                     Ellamae Sia, PT, DPT Acute Rehabilitation Services Pager [phone] Office [phone]    Willy Eddy 03/28/2019, 3:07 PM

## 2019-03-28 NOTE — Evaluation (Signed)
Physical Therapy Evaluation Patient Details Name: Abigail Medina MRN: [id number] DOB: January 25, 1966 Today's Date: 03/28/2019   History of Present Illness  Pt is a 53 y/o female presenting with weakness and poor cooordination of R UE. CT negative for acute findings, MRI reveals Acute ischemic nonhemorrhagic posterior left frontal cortical.  Known history of CVA with residual L sided weakness, PMH also includes: HTN, HIV, depression with anxiety, vision abnormalities.   Clinical Impression  Pt admitted with above. Pt presenting with decreased attention/awareness, coordination impairments, RUE weakness, and balance deficits. Pt drowsy throughout session, and evaluation likely impacted by Ativan given this morning. Requiring min assist for ambulating x 100 feet. BP 117/83. Recommending HHPT to maximize functional independence and safety. Will re-assess pt acutely to address deficits and progress mobility.     Follow Up Recommendations Home health PT;Supervision for mobility/OOB    Equipment Recommendations  None recommended by PT    Recommendations for Other Services       Precautions / Restrictions Precautions Precautions: Fall Restrictions Weight Bearing Restrictions: No      Mobility  Bed Mobility Overal bed mobility: Modified Independent Bed Mobility: Sit to Supine       Sit to supine: Supervision   General bed mobility comments: no assist required   Transfers Overall transfer level: Needs assistance   Transfers: Sit to/from Stand Sit to Stand: Min guard         General transfer comment: min guard for safety and balance  Ambulation/Gait Ambulation/Gait assistance: Min assist Gait Distance (Feet): 100 Feet Assistive device: None Gait Pattern/deviations: Step-through pattern;Scissoring;Drifts right/left Gait velocity: decr   General Gait Details: Pt requiring min assist for stability, with occasional scissoring likely due to decreased focus/attention/level of  arousal  Stairs            Wheelchair Mobility    Modified Rankin (Stroke Patients Only) Modified Rankin (Stroke Patients Only) Pre-Morbid Rankin Score: No symptoms Modified Rankin: Moderately severe disability     Balance Overall balance assessment: Mild deficits observed, not formally tested                                           Pertinent Vitals/Pain Pain Assessment: No/denies pain    Home Living Family/patient expects to be discharged to:: Private residence Living Arrangements: Children;Other relatives Available Help at Discharge: Family Type of Home: House Home Access: Stairs to enter   Technical brewer of Steps: 1 Home Layout: One level Home Equipment: None      Prior Function Level of Independence: Independent               Hand Dominance   Dominant Hand: Right    Extremity/Trunk Assessment   Upper Extremity Assessment Upper Extremity Assessment: RUE deficits/detail RUE Deficits / Details: grossly 3+/5 MMT, dysmetric, impaired sensation and proprioception  RUE Sensation: decreased light touch;decreased proprioception RUE Coordination: decreased fine motor;decreased gross motor LUE Deficits / Details: residual deficits from prior CVA, grossly 3+/5 and dysmetric but functional     Lower Extremity Assessment Lower Extremity Assessment: RLE deficits/detail RLE Deficits / Details: Strength 5/5    Cervical / Trunk Assessment Cervical / Trunk Assessment: Normal  Communication   Communication: No difficulties  Cognition Arousal/Alertness: Lethargic;Suspect due to medications Behavior During Therapy: Flat affect Overall Cognitive Status: Difficult to assess  General Comments: Pt able to follow commands, disoriented to month (reports feb), further assessment of cognition recommended        General Comments      Exercises     Assessment/Plan    PT Assessment  Patient needs continued PT services  PT Problem List Decreased strength;Decreased activity tolerance;Decreased balance;Decreased mobility;Decreased safety awareness       PT Treatment Interventions Gait training;Stair training;Functional mobility training;Therapeutic activities;Therapeutic exercise;Balance training;Patient/family education    PT Goals (Current goals can be found in the Care Plan section)  Acute Rehab PT Goals Patient Stated Goal: none stated PT Goal Formulation: With patient Time For Goal Achievement: 04/11/19 Potential to Achieve Goals: Good    Frequency Min 4X/week   Barriers to discharge        Co-evaluation               AM-PAC PT "6 Clicks" Mobility  Outcome Measure Help needed turning from your back to your side while in a flat bed without using bedrails?: None Help needed moving from lying on your back to sitting on the side of a flat bed without using bedrails?: None Help needed moving to and from a bed to a chair (including a wheelchair)?: A Little Help needed standing up from a chair using your arms (e.g., wheelchair or bedside chair)?: A Little Help needed to walk in hospital room?: A Little Help needed climbing 3-5 steps with a railing? : A Lot 6 Click Score: 19    End of Session Equipment Utilized During Treatment: Gait belt Activity Tolerance: Patient tolerated treatment well Patient left: in bed;with call bell/phone within reach;Other (comment)(with OT) Nurse Communication: Mobility status PT Visit Diagnosis: Unsteadiness on feet (R26.81);Other symptoms and signs involving the nervous system (R29.898)    Time: 9242-6834 PT Time Calculation (min) (ACUTE ONLY): 23 min   Charges:   PT Evaluation $PT Eval Moderate Complexity: 1 Mod PT Treatments $Therapeutic Activity: 8-22 mins       Ellamae Sia, PT, DPT Acute Rehabilitation Services Pager [phone] Office [phone]   Willy Eddy 03/28/2019, 3:56 PM

## 2019-03-29 ENCOUNTER — Encounter (HOSPITAL_COMMUNITY)
Admission: EM | Disposition: A | Discharge status: Home Health | Payer: Self-pay | Source: Home / Self Care | Attending: Internal Medicine

## 2019-03-29 DIAGNOSIS — I712 Thoracic aortic aneurysm, without rupture, unspecified: Secondary | ICD-10-CM | POA: Diagnosis present

## 2019-03-29 DIAGNOSIS — I63132 Cerebral infarction due to embolism of left carotid artery: Secondary | ICD-10-CM

## 2019-03-29 DIAGNOSIS — I6389 Other cerebral infarction: Principal | ICD-10-CM

## 2019-03-29 HISTORY — PX: LOOP RECORDER INSERTION: EP1214

## 2019-03-29 LAB — HEMOGLOBIN A1C
Hgb A1c MFr Bld: 5.2 % (ref 4.8–5.6)
Mean Plasma Glucose: 103 mg/dL

## 2019-03-29 LAB — BETA-2-GLYCOPROTEIN I ABS, IGG/M/A
Beta-2 Glyco I IgG: <9 GPI IgG units (ref 0–20)
Beta-2-Glycoprotein I IgA: <9 GPI IgA units (ref 0–25)
Beta-2-Glycoprotein I IgM: <9 GPI IgM units (ref 0–32)

## 2019-03-29 LAB — LUPUS ANTICOAGULANT PANEL
DRVVT: 36.8 s (ref 0.0–47.0)
PTT Lupus Anticoagulant: 32.5 s (ref 0.0–51.9)

## 2019-03-29 LAB — HOMOCYSTEINE: Homocysteine: 25.7 umol/L — ABNORMAL HIGH (ref 0.0–14.5)

## 2019-03-29 LAB — ANTI-DNA ANTIBODY, DOUBLE-STRANDED: ds DNA Ab: <1 IU/mL (ref 0–9)

## 2019-03-29 LAB — ANTINUCLEAR ANTIBODIES, IFA: ANA Ab, IFA: NEGATIVE

## 2019-03-29 SURGERY — LOOP RECORDER INSERTION

## 2019-03-29 MED ORDER — LIDOCAINE-EPINEPHRINE 1 %-1:100000 IJ SOLN
INTRAMUSCULAR | Status: AC
  Filled 2019-03-29: qty 1

## 2019-03-29 MED ORDER — ASPIRIN 81 MG PO TBEC: 81.0000 mg | DELAYED_RELEASE_TABLET | Freq: Every day | ORAL | Status: AC

## 2019-03-29 MED ORDER — ASPIRIN EC 81 MG PO TBEC: 81.0000 mg | DELAYED_RELEASE_TABLET | Freq: Every day | ORAL | Status: DC

## 2019-03-29 MED ORDER — CYANOCOBALAMIN 500 MCG PO TABS: 500.0000 ug | ORAL_TABLET | Freq: Every day | ORAL | 1 refills | Status: DC

## 2019-03-29 MED ORDER — LIDOCAINE-EPINEPHRINE 1 %-1:100000 IJ SOLN
INTRAMUSCULAR | Status: DC | PRN
  Administered 2019-03-29: 10 mL

## 2019-03-29 MED ORDER — CLOPIDOGREL BISULFATE 75 MG PO TABS: 75.0000 mg | ORAL_TABLET | Freq: Every day | ORAL | 1 refills | Status: DC

## 2019-03-29 MED ORDER — ATORVASTATIN CALCIUM 40 MG PO TABS: 40.0000 mg | ORAL_TABLET | Freq: Every day | ORAL | 1 refills | Status: DC

## 2019-03-29 MED ORDER — CLOPIDOGREL BISULFATE 75 MG PO TABS
75.0000 mg | ORAL_TABLET | Freq: Every day | ORAL | Status: DC
  Administered 2019-03-29: 17:00:00 75 mg via ORAL
  Filled 2019-03-29: qty 1

## 2019-03-29 SURGICAL SUPPLY — 2 items
LOOP REVEAL LINQSYS (Prosthesis & Implant Heart) ×2 IMPLANT
PACK LOOP INSERTION (CUSTOM PROCEDURE TRAY) ×2 IMPLANT

## 2019-03-29 NOTE — Discharge Summary (Signed)
Physician Discharge Summary  Abigail Medina PJK:[id number] DOB: 11-Dec-1966 DOA: 03/27/2019  PCP: Lanae Boast, FNP  Admit date: 03/27/2019 Discharge date: 03/29/2019  Time spent: 45 minutes  Recommendations for Outpatient Follow-up:  1. Follow up with dr [name] in 4-6 weeks 2. Follow up with PCP 2-3 weeks. Recommend CMET for tacking of potassium level and total bilirubin. Monitor aortic aneurysm.  3. Wound care as instructed   Discharge Diagnoses:  Principal Problem:   CVA (cerebral vascular accident) ([place]) Active Problems:   Essential hypertension   Acute focal neurological deficit   History of CVA with residual deficit   Cerebral thrombosis with cerebral infarction   Human immunodeficiency virus (HIV) disease ([place])   Hemiplegia, late effect of cerebrovascular disease ([place])   Hypokalemia   Hyperbilirubinemia   Aneurysm of thoracic aorta ([place])   Depression with anxiety   Discharge Condition: stable   Diet recommendation: heart healthy   Filed Weights   03/27/19 1800 03/27/19 2129  Weight: 74.4 kg 73.3 kg    History of present illness:  Abigail Medina is an 53 y.o. female with medical history significant forright MCA stroke, HIV (CD4 400 and VL undetectable in January),hypertension, and depression with anxiety, presented 03/27/19 to the emergency department for evaluation of right-sided weakness.Patient had been in her usual state of health with no recent illness, having an uneventful day until approximately 5:30 PM when she noted paresthesias involving the distal right upper extremity. She also reported weakness and loss of coordination involving the right arm. She has residual left-sided numbness and some weakness from a prior CVA, but reported that the right-sided symptoms are new.    Hospital Course:  Acute CVA. CT head with multiple chronic infarcts, MRI with acute posterior left frontal cortical infarct left MCA distribution. CTA head and neck with mild intracranial  atherosclerosis no large vessel occlusion. Echo with ef 65% with extensive complex plaque in descending aorta, TEE no PFO, LDL 144, hgA1c 5.2. evaluated by neuro who opine given mild stroke asa 81mg  and plavix for 21 days then just plavix. Had loop recorder placed to monitor for afib. Follow up with neuro as scheduled per neuro team  Aneurysm of thoracic. Incidental finding. TTE with Extensive whats looks to be complex placque in the descending aorta and portions of the abdominal aorta. Cannot completely exclude possible dissection flap. CTA chest with aneurysmal dilation of ascending thoracic aorta, is tortuous in configuration but normal in caliber, no evidence of dissection within ascending or descending thoracic aorta. OP follow up  HIVstable at baseline. CD4 400 and VL undetectable in January 2020  HypertensionPermissive hypertension in acute-phase suspected ischemic CVA. Resume home meds at discharge  Depression with anxietystable at baseline  Hypokalemia repleted. Will need OP follow up with PCP 1-2 weeks  Hyperbilirubinemia  - outpatient follow up  Low/normal B12 replete  Procedures:  Loop recorder  Consultations:  Dr [name]  Dr Curt Bears electrophysio  Discharge Exam: Vitals:   03/29/19 0830 03/29/19 1145  BP: (!) 141/102 (!) 128/95  Pulse: (!) 56 (!) 53  Resp: 16 17  Temp: 99.2 F (37.3 C) 98 F (36.7 C)  SpO2: 100% 100%    General: awake alert oriented x3. Poor dentition no acute distress Cardiovascular: rrr no mgr no LE edema Respiratory: normal effort BS clear bilaterally no wheeze   Discharge Instructions   Discharge Instructions    Ambulatory referral to Neurology   Complete by:  As directed    Follow up with stroke clinic NP Venancio Poisson  or Cecille Rubin, if both not available, consider Dr. Antony Contras, Dr. Bess Harvest, or Dr. Sarina Ill) at [facility] in about 4 weeks.   Call MD for:  persistant  dizziness or light-headedness   Complete by:  As directed    Diet - low sodium heart healthy   Complete by:  As directed    Discharge instructions   Complete by:  As directed    Take medications as prescribed Follow up with PCP 1-2 weeks to check potassium level, bilirubin Follow up with Dr. [name] as scheduled Home health PT   Increase activity slowly   Complete by:  As directed      Allergies as of 03/29/2019      Reactions   Lisinopril Swelling      Medication List    STOP taking these medications   aspirin 81 MG tablet Replaced by:  aspirin 81 MG EC tablet     TAKE these medications   albuterol 108 (90 Base) MCG/ACT inhaler Commonly known as:  PROVENTIL HFA;VENTOLIN HFA Inhale 2 puffs into the lungs every 4 (four) hours as needed for wheezing or shortness of breath (cough, shortness of breath or wheezing.).   aspirin 81 MG EC tablet Take 1 tablet (81 mg total) by mouth daily for 21 days. Start taking on:  March 30, 2019 Replaces:  aspirin 81 MG tablet   atorvastatin 40 MG tablet Commonly known as:  LIPITOR Take 1 tablet (40 mg total) by mouth daily at 6 PM.   cetirizine 10 MG tablet Commonly known as:  ZYRTEC Take 1 tablet (10 mg total) by mouth daily.   cimetidine 200 MG tablet Commonly known as:  TAGAMET Take 1 tablet (200 mg total) by mouth 2 (two) times daily. What changed:    when to take this  reasons to take this   clopidogrel 75 MG tablet Commonly known as:  PLAVIX Take 1 tablet (75 mg total) by mouth daily.   dolutegravir 50 MG tablet Commonly known as:  Tivicay TAKE 1 TABLET(50 MG) BY MOUTH DAILY What changed:    how much to take  how to take this  when to take this   emtricitabine-tenofovir AF 200-25 MG tablet Commonly known as:  Descovy Take 1 tablet by mouth daily.   fluticasone 50 MCG/ACT nasal spray Commonly known as:  FLONASE SPRAY 2 SPRAYS INTO EACH NOSTRIL EVERY DAY What changed:  See the new instructions.   gabapentin  400 MG capsule Commonly known as:  NEURONTIN Take 1 capsule (400 mg total) by mouth 3 (three) times daily. Start taking 1 tab daily x 7d, then twice a day x 7d, then can do TID What changed:    when to take this  additional instructions   hydrochlorothiazide 25 MG tablet Commonly known as:  HYDRODIURIL Take 1 tablet (25 mg total) by mouth daily.   metoprolol succinate 100 MG 24 hr tablet Commonly known as:  TOPROL-XL Take 1 tablet (100 mg total) by mouth daily. Take with or immediately following a meal.   PARoxetine 30 MG tablet Commonly known as:  PAXIL TAKE 1 TABLET BY MOUTH EVERY DAY   traZODone 50 MG tablet Commonly known as:  DESYREL TAKE 0.5 TABLETS (25 MG TOTAL) BY MOUTH AT BEDTIME AS NEEDED FOR SLEEP.   vitamin B-12 500 MCG tablet Commonly known as:  CYANOCOBALAMIN Take 1 tablet (500 mcg total) by mouth daily. Start taking on:  March 30, 2019   Vitamin D (Ergocalciferol) 1.25 MG (  50000 UT) Caps capsule Commonly known as:  DRISDOL TAKE ONE CAPSULE BY MOUTH ONE TIME PER WEEK What changed:    how much to take  how to take this  when to take this  additional instructions Notes to patient:  Continue home schedule            Durable Medical Equipment  (From admission, onward)         Start     Ordered   03/29/19 1122  For home use only DME 3 n 1  Once     03/29/19 1121         Allergies  Allergen Reactions  . Lisinopril Swelling   Follow-up Information    [facility] Follow up.   Specialty:  Home Health Services Why:  They will contact you for the first visit. Contact information: [place] [address] [phone]        [facility] Follow up.   Specialty:  Cardiology Why:  04/12/2019 @ 9:30AM, wound check visit.  This will be a virtual/video visit, please see discharge instructions for information to help failitate this Contact information: [address] [place] [phone]       [facility] Follow up in 4 week(s).   Specialty:  Neurology Why:  stroke clinic.office will call with appt date and time Contact information: [address] [place] [phone]           The results of significant diagnostics from this hospitalization (including imaging, microbiology, ancillary and laboratory) are listed below for reference.    Significant Diagnostic Studies: Ct Angio Head W Or Wo Contrast  Result Date: 03/27/2019 CLINICAL DATA:  Right-sided weakness. EXAM: CT ANGIOGRAPHY HEAD AND NECK TECHNIQUE: Multidetector CT imaging of the head and neck was performed using the standard protocol during bolus administration of intravenous contrast. Multiplanar CT image reconstructions and MIPs were obtained to evaluate the vascular anatomy. Carotid stenosis measurements (when applicable) are obtained utilizing NASCET criteria, using the distal internal carotid diameter as the denominator. CONTRAST:  80mL OMNIPAQUE IOHEXOL 350 MG/ML SOLN COMPARISON:  Head MRA 07/19/2015. FINDINGS: CTA NECK FINDINGS Aortic arch: Normal variant aortic arch branching pattern with common origin of the brachiocephalic and left common carotid arteries. Widely patent arch vessel origins. Right carotid system: Patent without evidence of stenosis or dissection. Tortuous distal cervical ICA. Left carotid system: Patent without evidence of stenosis or dissection. Tortuous distal cervical ICA. Vertebral arteries: Patent with the left being mildly dominant. No evidence of stenosis or dissection. Skeleton: Mild lower cervical disc degeneration. Other neck: No mass or enlarged lymph nodes. Upper chest: Clear lung apices. Review of the MIP images confirms the above findings CTA HEAD FINDINGS Anterior circulation: The internal carotid arteries are patent from skull base to carotid termini with mild atherosclerotic  plaque bilaterally not resulting in significant stenosis. ACAs and MCAs are patent without evidence of proximal branch occlusion or significant stenosis. No aneurysm is identified. Posterior circulation: The intracranial vertebral arteries are widely patent to the basilar. Patent PICAs and SCAs are seen bilaterally. The basilar artery is widely patent. There are small posterior communicating arteries bilaterally. PCAs are patent without evidence of significant stenosis. No aneurysm is identified. Venous sinuses: As permitted by contrast timing, patent. Anatomic variants: None. Delayed phase: No abnormal enhancement. Review of the MIP images confirms the above findings IMPRESSION: Mild intracranial atherosclerosis. No large  vessel occlusion or significant stenosis in the head and neck. Electronically Signed   By: Logan Bores M.D.   On: 03/27/2019 21:15   Ct Angio Neck W And/or Wo Contrast  Result Date: 03/27/2019 CLINICAL DATA:  Right-sided weakness. EXAM: CT ANGIOGRAPHY HEAD AND NECK TECHNIQUE: Multidetector CT imaging of the head and neck was performed using the standard protocol during bolus administration of intravenous contrast. Multiplanar CT image reconstructions and MIPs were obtained to evaluate the vascular anatomy. Carotid stenosis measurements (when applicable) are obtained utilizing NASCET criteria, using the distal internal carotid diameter as the denominator. CONTRAST:  58mL OMNIPAQUE IOHEXOL 350 MG/ML SOLN COMPARISON:  Head MRA 07/19/2015. FINDINGS: CTA NECK FINDINGS Aortic arch: Normal variant aortic arch branching pattern with common origin of the brachiocephalic and left common carotid arteries. Widely patent arch vessel origins. Right carotid system: Patent without evidence of stenosis or dissection. Tortuous distal cervical ICA. Left carotid system: Patent without evidence of stenosis or dissection. Tortuous distal cervical ICA. Vertebral arteries: Patent with the left being mildly dominant.  No evidence of stenosis or dissection. Skeleton: Mild lower cervical disc degeneration. Other neck: No mass or enlarged lymph nodes. Upper chest: Clear lung apices. Review of the MIP images confirms the above findings CTA HEAD FINDINGS Anterior circulation: The internal carotid arteries are patent from skull base to carotid termini with mild atherosclerotic plaque bilaterally not resulting in significant stenosis. ACAs and MCAs are patent without evidence of proximal branch occlusion or significant stenosis. No aneurysm is identified. Posterior circulation: The intracranial vertebral arteries are widely patent to the basilar. Patent PICAs and SCAs are seen bilaterally. The basilar artery is widely patent. There are small posterior communicating arteries bilaterally. PCAs are patent without evidence of significant stenosis. No aneurysm is identified. Venous sinuses: As permitted by contrast timing, patent. Anatomic variants: None. Delayed phase: No abnormal enhancement. Review of the MIP images confirms the above findings IMPRESSION: Mild intracranial atherosclerosis. No large vessel occlusion or significant stenosis in the head and neck. Electronically Signed   By: Logan Bores M.D.   On: 03/27/2019 21:15   Mr Brain Wo Contrast  Addendum Date: 03/28/2019   ADDENDUM REPORT: 03/28/2019 07:41 ADDENDUM: This addendum includes the initial findings and impression for this exam. This study was inadvertently signed prior to being dictated by mistake. MRI HEAD FINDINGS: Brain: Generalized age-related cerebral volume loss. Patchy T2/FLAIR hyperintensity within the periventricular and deep white matter both cerebral hemispheres, most consistent with chronic small vessel ischemic disease, mild in nature. Multifocal areas of encephalomalacia with gliosis seen involving the bilateral frontal and parietal lobes, compatible with remote ischemic infarcts. Small remote right cerebellar infarct. Confluent area of restricted  diffusion measuring approximately 3.4 cm in size seen involving the posterior left frontal lobe, consistent with acute left MCA territory infarct (series 5, image 80). Involvement of the left precentral gyrus. No associated hemorrhage or mass effect. No other evidence for acute or subacute ischemia. Gray-white matter differentiation otherwise maintained. No evidence for acute or chronic intracranial hemorrhage. No mass lesion, midline shift or mass effect. No hydrocephalus. No extra-axial fluid collection. Pituitary gland suprasellar region normal. No abnormal foci of restricted diffusion to suggest acute or subacute ischemia. Gray-white matter differentiation well maintained. No encephalomalacia to suggest chronic infarction. No foci of susceptibility artifact to suggest acute or chronic intracranial hemorrhage. No mass lesion, midline shift or mass effect. No hydrocephalus. No extra-axial fluid collection. Major dural sinuses are grossly patent. Pituitary gland and suprasellar region are normal. Midline structures  intact and normal. Vascular: Major intracranial vascular flow voids are maintained Skull and upper cervical spine: Craniocervical junction within normal limits. Upper cervical spine normal. Bone marrow signal intensity within normal limits. No scalp soft tissue abnormality. Sinuses/Orbits: Globes and orbital soft tissues within normal limits. Mild scattered mucosal thickening within the ethmoidal air cells. Paranasal sinuses are clear. No mastoid effusion. Other: None. MRI HEAD IMPRESSION: 1. Acute ischemic nonhemorrhagic posterior left frontal cortical infarct, left MCA distribution. No associated mass effect. 2. No other acute intracranial abnormality. 3. Multiple chronic infarcts involving the frontal and parietal lobes bilaterally as well as the right cerebellum. 4. Underlying mild chronic microvascular ischemic disease. Electronically Signed   By: Jeannine Boga M.D.   On: 03/28/2019 07:41    Result Date: 03/28/2019 EXAM: MRI HEAD WITHOUT CONTRAST TECHNIQUE: Multiplanar, multiecho pulse sequences of the brain and surrounding structures were obtained without intravenous contrast. COMPARISON:  None. FINDINGS: Brain: Vascular: Skull and upper cervical spine: Sinuses/Orbits: Other: Electronically Signed: By: Jeannine Boga M.D. On: 03/28/2019 05:04   Ct Angio Chest Aorta W/cm &/or Wo/cm  Result Date: 03/28/2019 CLINICAL DATA:  Possible dissection of the descending thoracic aorta seen on ECHO. EXAM: CT ANGIOGRAPHY CHEST WITH CONTRAST TECHNIQUE: Multidetector CT imaging of the chest was performed using the standard protocol during bolus administration of intravenous contrast. Multiplanar CT image reconstructions and MIPs were obtained to evaluate the vascular anatomy. CONTRAST:  136mL OMNIPAQUE IOHEXOL 350 MG/ML SOLN COMPARISON:  None. FINDINGS: Cardiovascular: Aneurysmal dilatation of the ascending thoracic aorta measures 3.8 cm diameter. Descending thoracic aorta is tortuous but normal in caliber. No evidence of aortic dissection. Mild eccentric noncalcified plaque within the descending thoracic aorta. Eccentric and partially calcified plaque is identified at the level of the upper abdominal aorta. Heart size is within normal limits. No pericardial effusion. Mediastinum/Nodes: No mass or enlarged lymph nodes seen within the mediastinum or perihilar regions. Esophagus appears normal. Trachea and central bronchi are unremarkable. Lungs/Pleura: Minimal bibasilar atelectasis. Lungs otherwise clear. No pleural effusion or pneumothorax. Upper Abdomen: Limited images of the upper abdomen are unremarkable. Musculoskeletal: Degenerative spondylosis within the slightly kyphotic lower thoracic spine, mild to moderate in degree with associated disc space narrowings, osseous spurring and articular surface sclerosis. No acute or suspicious osseous finding. Review of the MIP images confirms the above  findings. IMPRESSION: 1. Aneurysmal dilatation of the ascending thoracic aorta, measuring 3.8 cm diameter, corresponding to measurements provided on today's ECHO. Descending thoracic aorta is tortuous in configuration but is normal in caliber. 2. No evidence of aortic dissection within the ascending or descending thoracic aorta. 3. No acute findings. Aortic Atherosclerosis (ICD10-I70.0). Electronically Signed   By: Franki Cabot M.D.   On: 03/28/2019 17:02   Ct Head Code Stroke Wo Contrast  Result Date: 03/27/2019 CLINICAL DATA:  Code stroke.  Right-sided weakness. EXAM: CT HEAD WITHOUT CONTRAST TECHNIQUE: Contiguous axial images were obtained from the base of the skull through the vertex without intravenous contrast. COMPARISON:  Brain MRI 07/06/2015 FINDINGS: Brain: There is no evidence of acute infarct, intracranial hemorrhage, mass, midline shift, or extra-axial fluid collection. Chronic small to moderate-sized posterior right frontal and left parietal infarcts, a small chronic left frontal and right parietal infarcts, and a small chronic right cerebellar infarct are unchanged. Hypodensities elsewhere in the cerebral white matter bilaterally are nonspecific but compatible with mild chronic small vessel ischemic disease. There is mild cerebral atrophy. Vascular: Calcified atherosclerosis at the skull base. No hyperdense vessel. Skull: No fracture 5 mm lucency  in the left frontoparietal skull at the vertex, also present in 2008 and considered benign. Sinuses/Orbits: Mild mucosal thickening in the right frontal, bilateral ethmoid, and left sphenoid sinuses. Clear mastoid air cells. Unremarkable orbits. Other: None. ASPECTS [facility] Stroke Program Early CT Score) - Ganglionic level infarction (caudate, lentiform nuclei, internal capsule, insula, M1-M3 cortex): 7 - Supraganglionic infarction (M4-M6 cortex): 3 Total score (0-10 with 10 being normal): 10 IMPRESSION: 1. No evidence of acute intracranial abnormality.  2. ASPECTS is 10. 3. Multiple chronic infarcts, unchanged from 2016. These results were communicated to Dr. Lorraine Lax at 6:52 pm on 03/27/2019 by text page via the [facility] messaging system. Electronically Signed   By: Logan Bores M.D.   On: 03/27/2019 18:52    Microbiology: No results found for this or any previous visit (from the past 240 hour(s)).   Labs: Basic Metabolic Panel: Recent Labs  Lab 03/27/19 1834 03/27/19 1844 03/28/19 1012  NA 140  --  140  K 3.3*  --  3.3*  CL 101  --  106  CO2 26  --  24  GLUCOSE 87  --  105*  BUN 10  --  11  CREATININE 0.95 1.00 0.92  CALCIUM 9.6  --  8.9  MG  --   --  1.8   Liver Function Tests: Recent Labs  Lab 03/27/19 1834 03/28/19 1012  AST 25  --   ALT 23  --   ALKPHOS 77  --   BILITOT 2.0* 2.1*  PROT 7.4  --   ALBUMIN 4.0  --    No results for input(s): LIPASE, AMYLASE in the last 168 hours. No results for input(s): AMMONIA in the last 168 hours. CBC: Recent Labs  Lab 03/27/19 1834  WBC 3.9*  NEUTROABS 1.5*  HGB 15.8*  HCT 47.5*  MCV 92.6  PLT 177   Cardiac Enzymes: No results for input(s): CKTOTAL, CKMB, CKMBINDEX, TROPONINI in the last 168 hours. BNP: BNP (last 3 results) No results for input(s): BNP in the last 8760 hours.  ProBNP (last 3 results) No results for input(s): PROBNP in the last 8760 hours.  CBG: Recent Labs  Lab 03/27/19 1856  GLUCAP 97       Signed:  Radene Gunning NP  [facility] 03/29/2019, 2:16 PM

## 2019-03-29 NOTE — TOC Initial Note (Signed)
Transition of Care [facility]) - Initial/Assessment Note    Patient Details  Name: Abigail Medina MRN: [id number] Date of Birth: 1966/12/03  Transition of [facility]) CM/SW Contact:    Pollie Friar, RN Phone Number: 03/29/2019, 11:27 AM  Clinical Narrative:                   Expected Discharge Plan: [place] Barriers to Discharge: Continued Medical Work up   Patient Goals and CMS Choice   CMS Medicare.gov Compare Post Acute Care list provided to:: Patient Choice offered to / list presented to : Patient  Expected Discharge Plan and Services Expected Discharge Plan: [place] Choice: Home Health, Durable Medical Equipment Living arrangements for the past 2 months: Single Family Home(one level)                     HH Arranged: PT, OT HH Agency: [facility] with [facility] accepted the referral  Prior Living Arrangements/Services Living arrangements for the past 2 months: Single Family Home(one level) Lives with:: Adult Children Patient language and need for interpreter reviewed:: Yes(no needs) Do you feel safe going back to the place where you live?: Yes      Need for Family Participation in Patient Care: Yes (Comment)(24 hour supervision recommended) Care giver support system in place?: Yes (comment)(daughter can provide 24 hour supervision.)   Criminal Activity/Legal Involvement Pertinent to Current Situation/Hospitalization: No - Comment as needed  Activities of Daily Living Home Assistive Devices/Equipment: Eyeglasses ADL Screening (condition at time of admission) Patient's cognitive ability adequate to safely complete daily activities?: Yes Is the patient deaf or have difficulty hearing?: No Does the patient have difficulty seeing, even when wearing glasses/contacts?: No Does the patient have difficulty concentrating, remembering, or making decisions?: No Patient able to express need for assistance  with ADLs?: Yes Does the patient have difficulty dressing or bathing?: No Independently performs ADLs?: Yes (appropriate for developmental age) Does the patient have difficulty walking or climbing stairs?: No Weakness of Legs: Left Weakness of Arms/Hands: Both  Permission Sought/Granted                  Emotional Assessment Appearance:: Appears stated age Attitude/Demeanor/Rapport: Engaged Affect (typically observed): Accepting, Appropriate, Pleasant Orientation: : Oriented to Self, Oriented to Place, Oriented to  Time, Oriented to Situation   Psych Involvement: No (comment)  Admission diagnosis:  Right sided weakness [R53.1] Patient Active Problem List   Diagnosis Date Noted  . Aneurysm of thoracic aorta ([place]) 03/29/2019  . Cerebral thrombosis with cerebral infarction 03/28/2019  . CVA (cerebral vascular accident) ([place]) 03/28/2019  . Acute focal neurological deficit 03/27/2019  . Hypokalemia 03/27/2019  . Hyperbilirubinemia 03/27/2019  . History of CVA with residual deficit   . Pain of back and left lower extremity 12/15/2018  . Upper respiratory infection, viral 12/15/2018  . 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 Tobacco dependence 06/02/2015  . Numbness and tingling of right arm 05/30/2015  . Right arm weakness 05/30/2015  . Arm numbness left 10/10/2014  . Vitamin D deficiency 08/05/2014  . Allergic rhinitis 05/25/2013  . 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 Polypharmacy 08/20/2012  . HIV (human immunodeficiency virus infection) ([place]) 08/20/2012  . HLD (hyperlipidemia) 07/15/2012  . BP (high blood pressure) 07/15/2012  . Cerebrovascular accident, late effects 01/30/2012  . Depression with anxiety 07/15/2011  . Seborrhea capitis 04/18/2011  . Seborrheic dermatitis 03/07/2011  .  Hemiplegia, late effect of cerebrovascular disease ([place]) 01/25/2011  . VAGINAL DISCHARGE 12/26/2010  . NEVUS 08/29/2010  . AMENORRHEA 07/19/2010  . SKIN RASH 05/21/2010  . THRUSH 12/28/2009   . Human immunodeficiency virus (HIV) disease ([place]) 08/06/2007  . Essential hypertension 08/06/2007   PCP:  Lanae Boast, [place] Pharmacy:   [id number] [place], [place] [address] Phone: [phone] Fax: [phone]  [facility] [place], [address] [place] [address] Phone: [phone] Fax: [phone]     Social Determinants of Health (SDOH) Interventions  Denies issues with transportation. Denies issues with obtaining or taking home meds.  Readmission Risk Interventions No flowsheet data found.

## 2019-03-29 NOTE — Discharge Instructions (Signed)
Monitor Implant site/wound care instructions Keep incision clean and dry for 3 days. You can remove outer dressing tomorrow. Leave steri-strips (little pieces of tape) on until seen in the office for wound check appointment. Call the office [phone]) for redness, drainage, swelling, or fever.   PLEASE SIGN UP TO YOUR MY CHART ACCOUNT WHEN YOU GET HOME (if you aren't already).  IN THE CURRENT ENVIRONMENT WITH COVID-19, IN EFFORT TO REDUCE YOUR EXPOSURE WE WILL BE CONDUCTING MANY PATIENT VISITS BY EITHER VIRTUAL/VIDEO VISITS or TELEPHONE VISITS.  BEING SIGNED UP IN YOUR MY CHART ACCOUNT  WILL HELP FACILITATE THESE VISITS AND OUR COMMUNICATION WITH YOU   YOUR CARDIOLOGY TEAM HAS ARRANGED FOR AN E-VISIT FOR YOUR APPOINTMENT - PLEASE REVIEW IMPORTANT INFORMATION BELOW SEVERAL DAYS PRIOR TO YOUR APPOINTMENT  Due to the recent COVID-19 pandemic, we are transitioning in-person office visits to tele-medicine visits in an effort to decrease unnecessary exposure to our patients and staff. Medicare and most insurances are covering these visits without a copay needed. We also encourage you to sign up for MyChart if you have not already done so. You will need a smartphone if possible. For patients that do not have this, we can still complete the visit using a regular telephone but do prefer a smartphone to enable video when possible. You may have a close family member that lives with you that can help. If possible, we also ask that you have a blood pressure cuff and scale at home to measure your blood pressure, heart rate and weight prior to your scheduled appointment. Patients with clinical needs that need an in-person evaluation and testing will still be able to come to the office if absolutely necessary. If you have any questions, feel free to call our office.    IF YOU HAVE A SMARTPHONE, PLEASE DOWNLOAD THE WEBEX APP TO YOUR SMARTPHONE  - If Apple, go to [employer] and type in WebEx in the search bar.  [place] [employer], the blue/green circle. The app is free but as with any other app download, your phone may require you to verify saved payment information or Apple password. You do NOT have to create a WebEx account.  - If Android, go to [employer] and type in [employer] in the search bar. [place] [employer], the blue/green circle. The app is free but as with any other app download, your phone may require you to verify saved payment information or Android password. You do NOT have to create a WebEx account.  It is very helpful to have this downloaded before your visit.    2-3 DAYS BEFORE YOUR APPOINTMENT  You will receive a telephone call from one of our [place] team members - your caller ID may say "Unknown caller." If this is a video visit, we will confirm that you have been able to download the WebEx app. We will remind you check your blood pressure, heart rate and weight prior to your scheduled appointment. If you have an Apple Watch or Kardia, please upload any pertinent ECG strips the day before or morning of your appointment to [place]. Our staff will also make sure you have reviewed the consent and agree to move forward with your scheduled tele-health visit.     THE DAY OF YOUR APPOINTMENT  Approximately 15 minutes prior to your scheduled appointment, you will receive a telephone call from one of [facility] team - your caller ID may say "Unknown caller."  Our staff will confirm medications, vital  signs for the day and any symptoms you may be experiencing. Please have this information available prior to the time of visit start. It may also be helpful for you to have a pad of paper and pen handy for any instructions given during your visit. They will also walk you through joining the WebEx smartphone meeting if this is a video visit.    CONSENT FOR TELE-HEALTH VISIT - PLEASE REVIEW  I hereby voluntarily request, consent and authorize [place] and its  employed or contracted physicians, physician assistants, nurse practitioners or other licensed health care professionals (the Practitioner), to provide me with telemedicine health care services (the Services") as deemed necessary by the treating Practitioner. I acknowledge and consent to receive the Services by the Practitioner via telemedicine. I understand that the telemedicine visit will involve communicating with the Practitioner through live audiovisual communication technology and the disclosure of certain medical information by electronic transmission. I acknowledge that I have been given the opportunity to request an in-person assessment or other available alternative prior to the telemedicine visit and am voluntarily participating in the telemedicine visit.  I understand that I have the right to withhold or withdraw my consent to the use of telemedicine in the course of my care at any time, without affecting my right to future care or treatment, and that the Practitioner or I may terminate the telemedicine visit at any time. I understand that I have the right to inspect all information obtained and/or recorded in the course of the telemedicine visit and may receive copies of available information for a reasonable fee.  I understand that some of the potential risks of receiving the Services via telemedicine include:   Delay or interruption in medical evaluation due to technological equipment failure or disruption;  Information transmitted may not be sufficient (e.g. poor resolution of images) to allow for appropriate medical decision making by the Practitioner; and/or   In rare instances, security protocols could fail, causing a breach of personal health information.  Furthermore, I acknowledge that it is my responsibility to provide information about my medical history, conditions and care that is complete and accurate to the best of my ability. I acknowledge that Practitioner's advice,  recommendations, and/or decision may be based on factors not within their control, such as incomplete or inaccurate data provided by me or distortions of diagnostic images or specimens that may result from electronic transmissions. I understand that the practice of medicine is not an exact science and that Practitioner makes no warranties or guarantees regarding treatment outcomes. I acknowledge that I will receive a copy of this consent concurrently upon execution via email to the email address I last provided but may also request a printed copy by calling the office of [facility].    I understand that my insurance will be billed for this visit.   I have read or had this consent read to me.  I understand the contents of this consent, which adequately explains the benefits and risks of the Services being provided via telemedicine.   I have been provided ample opportunity to ask questions regarding this consent and the Services and have had my questions answered to my satisfaction.  I give my informed consent for the services to be provided through the use of telemedicine in my medical care  By participating in this telemedicine visit I agree to the above.

## 2019-03-29 NOTE — Consult Note (Signed)
ELECTROPHYSIOLOGY CONSULT NOTE  Patient ID: Abigail Medina MRN: [id number], DOB/AGE: 55-Dec-1967   Admit date: 03/27/2019 Date of Consult: 03/29/2019  Primary Physician: Lanae Boast, [place] Primary Cardiologist: none Reason for Consultation: Cryptogenic stroke ; recommendations regarding Implantable Loop Recorder, requested by Dr. Erlinda Hong  History of Present Illness Abigail Medina was admitted on 03/27/2019 with new R sided symptoms, stroke  PMHx includes HIV ( on retroviral therapy, VL undetectable in January), HTN, HLD, seizure disorder, prior stroke, quit smoking 3 years ago, depression/anxiety Neurology noted: posterior left frontal cortical infarct, left MCA distribution - embolic pattern - unknown source, requests loop evaluation/implant.  she has undergone workup for stroke including echocardiogram and carotidangio.  The patient has been monitored on telemetry which has demonstrated sinus rhythm with no arrhythmias.  Inpatient stroke work-up Abigail Medina not include TEE given COVID19 restrictions.  Echocardiogram this admission demonstrated   IMPRESSIONS  1. The left ventricle has hyperdynamic systolic function, with an ejection fraction of >65%. The cavity size was normal. Left ventricular diastolic Doppler parameters are consistent with impaired relaxation.  2. The right ventricle has normal systolc function. The cavity was normal. There is no increase in right ventricular wall thickness.  3. Negative bubble study for intracardiac shunt.  4. No evidence of mitral valve stenosis.  5. No stenosis of the aortic valve.  6. The aortic root is normal in size and structure.  7. There is mild dilatation of the ascending aorta measuring 40 mm.  8. Significant plaque noted in the visualized portion of the abdominal aorta, partially mobile.  9. Pulmonary hypertension is indeterminant, inadequate TR jet. 10. The interatrial septum was not well visualized.  FINDINGS  Left Ventricle: The left ventricle has  hyperdynamic systolic function, with an ejection fraction of >65%. The cavity size was normal. There is no increase in left ventricular wall thickness. Left ventricular diastolic Doppler parameters are consistent  with impaired relaxation (grade I).   Right Ventricle: The right ventricle has normal systolic function. The cavity was normal. There is no increase in right ventricular wall thickness. Left Atrium: Left atrial size was normal in size. Right Atrium: Right atrial size was normal in size. Right atrial pressure is estimated at 3 mmHg. Interatrial Septum: The interatrial septum was not well visualized. Agitated saline contrast was given intravenously to evaluate for intracardiac shunting. Negative bubble study for intracardiac shunt.   Pericardium: There is no evidence of pericardial effusion. Mitral Valve: The mitral valve is normal in structure. Mitral valve regurgitation is not visualized by color flow Doppler. No evidence of mitral valve stenosis. Tricuspid Valve: The tricuspid valve was normal in structure. Tricuspid valve regurgitation is trivial by color flow Doppler. Aortic Valve: The aortic valve is normal in structure. Aortic valve regurgitation was not visualized by color flow Doppler. There is No stenosis of the aortic valve. Pulmonic Valve: The pulmonic valve was not well visualized. Aorta: The aortic root is normal in size and structure. There is mild dilatation of the ascending aorta measuring 40 mm. Significant plaque noted in the visualized portion of the abdominal aorta, partially mobile.   Pulmonary Artery: Pulmonary hypertension is indeterminant, inadequate TR jet. Venous: The inferior vena cava is normal in size with greater than 50% respiratory variability.   (CT chest noted below, no dissection)   Lab work is reviewed. WBC 3.9 (chronically low)  VSS, (Hypertensive, deferred to neurology/IM),  afebrile (99.2 this AM)    Prior to admission, the patient denies  chest pain, shortness of breath,  no near syncope or syncope.  She Abigail Medina rarely feels momentarily dizzy with quick head movements only, no palpitations.  She is recovering from their stroke with plans to home at discharge    Past Medical History:  Diagnosis Date   HIV (human immunodeficiency virus infection) ([place])    Hypertension    Seizures ([place])    Stroke [facility])    Vision abnormalities      Surgical History: History reviewed. No pertinent surgical history.   Medications Prior to Admission  Medication Sig Dispense Refill Last Dose   cetirizine (ZYRTEC) 10 MG tablet Take 1 tablet (10 mg total) by mouth daily. 30 tablet 11 03/27/2019 at Unknown time   cimetidine (TAGAMET) 200 MG tablet Take 1 tablet (200 mg total) by mouth 2 (two) times daily. (Patient taking differently: Take 200 mg by mouth daily as needed (heartburn). ) 30 tablet 11 Past Week at Unknown time   dolutegravir (TIVICAY) 50 MG tablet TAKE 1 TABLET(50 MG) BY MOUTH DAILY (Patient taking differently: Take 50 mg by mouth daily. TAKE 1 TABLET(50 MG) BY MOUTH DAILY) 30 tablet 3 03/27/2019 at Unknown time   emtricitabine-tenofovir AF (DESCOVY) 200-25 MG tablet Take 1 tablet by mouth daily. 30 tablet 3 03/27/2019 at Unknown time   fluticasone (FLONASE) 50 MCG/ACT nasal spray SPRAY 2 SPRAYS INTO EACH NOSTRIL EVERY DAY (Patient taking differently: Place 2 sprays into both nostrils daily. ) 16 g 2 03/27/2019 at Unknown time   gabapentin (NEURONTIN) 400 MG capsule Take 1 capsule (400 mg total) by mouth 3 (three) times daily. Start taking 1 tab daily x 7d, then twice a day x 7d, then can do TID (Patient taking differently: Take 400 mg by mouth See admin instructions. Start taking 1 tab daily x 7d, then twice a day x 7d, then can take 1 capsule by mouth TID thereafter.) 90 capsule 5 Past Month at Unknown time   hydrochlorothiazide (HYDRODIURIL) 25 MG tablet Take 1 tablet (25 mg total) by mouth daily. 90 tablet 11 03/27/2019 at Unknown time    metoprolol succinate (TOPROL-XL) 100 MG 24 hr tablet Take 1 tablet (100 mg total) by mouth daily. Take with or immediately following a meal. 30 tablet 11 03/27/2019 at Unknown time   PARoxetine (PAXIL) 30 MG tablet TAKE 1 TABLET BY MOUTH EVERY DAY (Patient taking differently: Take 30 mg by mouth daily. ) 90 tablet 4 Past Week at Unknown time   Vitamin D, Ergocalciferol, (DRISDOL) 1.25 MG (50000 UT) CAPS capsule TAKE ONE CAPSULE BY MOUTH ONE TIME PER WEEK (Patient taking differently: Take 50,000 Units by mouth every 7 (seven) days. ) 8 capsule 1 Past Week at Unknown time   albuterol (PROVENTIL HFA;VENTOLIN HFA) 108 (90 Base) MCG/ACT inhaler Inhale 2 puffs into the lungs every 4 (four) hours as needed for wheezing or shortness of breath (cough, shortness of breath or wheezing.). 1 Inhaler 6 03/24/2019   aspirin 81 MG tablet Take 81 mg by mouth daily.   03/25/2019   traZODone (DESYREL) 50 MG tablet TAKE 0.5 TABLETS (25 MG TOTAL) BY MOUTH AT BEDTIME AS NEEDED FOR SLEEP. 30 tablet 3 03/25/2019    Inpatient Medications:   aspirin  300 mg Rectal Daily   Or   aspirin  325 mg Oral Daily   atorvastatin  40 mg Oral q1800   dolutegravir  50 mg Oral Daily   emtricitabine-tenofovir AF  1 tablet Oral Daily   enoxaparin (LOVENOX) injection  40 mg Subcutaneous Q24H   gabapentin  400  mg Oral TID   PARoxetine  30 mg Oral Daily   sodium chloride flush  3 mL Intravenous Once   vitamin B-12  500 mcg Oral Daily    Allergies:  Allergies  Allergen Reactions   Lisinopril Swelling    Social History   Socioeconomic History   Marital status: Single    Spouse name: Not on file   Number of children: Not on file   Years of education: Not on file   Highest education level: Not on file  Occupational History   Not on file  Social Needs   Financial resource strain: Not on file   Food insecurity:    Worry: Not on file    Inability: Not on file   Transportation needs:    Medical: Not on  file    Non-medical: Not on file  Tobacco Use   Smoking status: Former Smoker    Packs/day: 0.25    Types: Cigarettes    Start date: 10/17/2015   Smokeless tobacco: Never Used  Substance and Sexual Activity   Alcohol use: Yes    Alcohol/week: 2.0 - 3.0 Medina drinks    Types: 2 - 3 Medina drinks or equivalent per week    Comment: liquor or beer; stated has increased with social meetings   Drug use: No   Sexual activity: Yes    Partners: Male    Birth control/protection: Condom    Comment: accepted condoms  Lifestyle   Physical activity:    Days per week: Not on file    Minutes per session: Not on file   Stress: Not on file  Relationships   Social connections:    Talks on phone: Not on file    Gets together: Not on file    Attends religious service: Not on file    Active member of club or organization: Not on file    Attends meetings of clubs or organizations: Not on file    Relationship status: Not on file   Intimate partner violence:    Fear of current or ex partner: Not on file    Emotionally abused: Not on file    Physically abused: Not on file    Forced sexual activity: Not on file  Other Topics Concern   Not on file  Social History Narrative   Not on file     Family History  Problem Relation Age of Onset   Hypertension Mother       Review of Systems: All other systems reviewed and are otherwise negative except as noted above.  Physical Exam: Vitals:   03/28/19 0530 03/28/19 2026 03/29/19 0024 03/29/19 0439  BP: (!) 119/91 131/89 (!) 154/91 (!) 138/94  Pulse: 69 (!) 58 (!) 57 (!) 58  Resp: 18 17 18 19   Temp: 98.2 F (36.8 C) 97.9 F (36.6 C) 98.4 F (36.9 C) 98.1 F (36.7 C)  TempSrc: Oral Oral Oral Oral  SpO2: 99% 97% 100% 100%  Weight:      Height:        limited exam  GEN- The patient is well appearing, alert and oriented x 3 today.   Head- normocephalic, atraumatic Eyes-  Sclera clear, conjunctiva pink Ears- hearing  intact Oropharynx- not examined Neck- supple Lungs-  normal work of breathing, talking in full sentences without difficulty Heart- RRR, telemetry and echo reviewed  GI- not examined Extremities- no clubbing, cyanosis, or edema MS-   no obvious deformities Skin-  no rash or lesion chest wall Psych-  euthymic mood, full affect   Labs:   Lab Results  Component Value Date   WBC 3.9 (L) 03/27/2019   HGB 15.8 (H) 03/27/2019   HCT 47.5 (H) 03/27/2019   MCV 92.6 03/27/2019   PLT 177 03/27/2019    Recent Labs  Lab 03/27/19 1834  03/28/19 1012  NA 140  --  140  K 3.3*  --  3.3*  CL 101  --  106  CO2 26  --  24  BUN 10  --  11  CREATININE 0.95   < > 0.92  CALCIUM 9.6  --  8.9  PROT 7.4  --   --   BILITOT 2.0*  --  2.1*  ALKPHOS 77  --   --   ALT 23  --   --   AST 25  --   --   GLUCOSE 87  --  105*   < > = values in this interval not displayed.   Lab Results  Component Value Date   CKTOTAL 120 01/21/2011   CKMB 5.6 (H) 01/21/2011   TROPONINI 0.03        NO INDICATION OF MYOCARDIAL INJURY. 01/21/2011   Lab Results  Component Value Date   CHOL 229 (H) 03/28/2019   CHOL 192 01/23/2017   CHOL 193 02/13/2016   Lab Results  Component Value Date   HDL 55 03/28/2019   HDL 61 01/23/2017   HDL 52 02/13/2016   Lab Results  Component Value Date   LDLCALC 144 (H) 03/28/2019   LDLCALC 109 (H) 01/23/2017   LDLCALC 104 02/13/2016   Lab Results  Component Value Date   TRIG 149 03/28/2019   TRIG 109 01/23/2017   TRIG 187 (H) 02/13/2016   Lab Results  Component Value Date   CHOLHDL 4.2 03/28/2019   CHOLHDL 3.1 01/23/2017   CHOLHDL 3.7 02/13/2016   No results found for: LDLDIRECT  No results found for: DDIMER   Radiology/Studies:   Ct Angio Head W Or Wo Contrast Result Date: 03/27/2019 CLINICAL DATA:  Right-sided weakness. EXAM: CT ANGIOGRAPHY HEAD AND NECK TECHNIQUE: Multidetector CT imaging of the head and neck was performed using the Medina protocol during  bolus administration of intravenous contrast. Multiplanar CT image reconstructions and MIPs were obtained to evaluate the vascular anatomy. Carotid stenosis measurements (when applicable) are obtained utilizing NASCET criteria, using the distal internal carotid diameter as the denominator. CONTRAST:  81mL OMNIPAQUE IOHEXOL 350 MG/ML SOLN COMPARISON:  Head MRA 07/19/2015. FINDINGS: CTA NECK FINDINGS Aortic arch: Normal variant aortic arch branching pattern with common origin of the brachiocephalic and left common carotid arteries. Widely patent arch vessel origins. Right carotid system: Patent without evidence of stenosis or dissection. Tortuous distal cervical ICA. Left carotid system: Patent without evidence of stenosis or dissection. Tortuous distal cervical ICA. Vertebral arteries: Patent with the left being mildly dominant. No evidence of stenosis or dissection. Skeleton: Mild lower cervical disc degeneration. Other neck: No mass or enlarged lymph nodes. Upper chest: Clear lung apices. Review of the MIP images confirms the above findings CTA HEAD FINDINGS Anterior circulation: The internal carotid arteries are patent from skull base to carotid termini with mild atherosclerotic plaque bilaterally not resulting in significant stenosis. ACAs and MCAs are patent without evidence of proximal branch occlusion or significant stenosis. No aneurysm is identified. Posterior circulation: The intracranial vertebral arteries are widely patent to the basilar. Patent PICAs and SCAs are seen bilaterally. The basilar artery is widely patent. There are small posterior  communicating arteries bilaterally. PCAs are patent without evidence of significant stenosis. No aneurysm is identified. Venous sinuses: As permitted by contrast timing, patent. Anatomic variants: None. Delayed phase: No abnormal enhancement. Review of the MIP images confirms the above findings IMPRESSION: Mild intracranial atherosclerosis. No large vessel occlusion  or significant stenosis in the head and neck. Electronically Signed   By: Logan Bores MedinaD.   On: 03/27/2019 21:15    Mr Brain Wo Contrast Addendum Date: 03/28/2019   ADDENDUM REPORT: 03/28/2019 07:41 ADDENDUM: This addendum includes the initial findings and impression for this exam. This study was inadvertently signed prior to being dictated by mistake. MRI HEAD FINDINGS: Brain: Generalized age-related cerebral volume loss. Patchy T2/FLAIR hyperintensity within the periventricular and deep white matter both cerebral hemispheres, most consistent with chronic small vessel ischemic disease, mild in nature. Multifocal areas of encephalomalacia with gliosis seen involving the bilateral frontal and parietal lobes, compatible with remote ischemic infarcts. Small remote right cerebellar infarct. Confluent area of restricted diffusion measuring approximately 3.4 cm in size seen involving the posterior left frontal lobe, consistent with acute left MCA territory infarct (series 5, image 80). Involvement of the left precentral gyrus. No associated hemorrhage or mass effect. No other evidence for acute or subacute ischemia. Gray-white matter differentiation otherwise maintained. No evidence for acute or chronic intracranial hemorrhage. No mass lesion, midline shift or mass effect. No hydrocephalus. No extra-axial fluid collection. Pituitary gland suprasellar region normal. No abnormal foci of restricted diffusion to suggest acute or subacute ischemia. Gray-white matter differentiation well maintained. No encephalomalacia to suggest chronic infarction. No foci of susceptibility artifact to suggest acute or chronic intracranial hemorrhage. No mass lesion, midline shift or mass effect. No hydrocephalus. No extra-axial fluid collection. Major dural sinuses are grossly patent. Pituitary gland and suprasellar region are normal. Midline structures intact and normal. Vascular: Major intracranial vascular flow voids are maintained  Skull and upper cervical spine: Craniocervical junction within normal limits. Upper cervical spine normal. Bone marrow signal intensity within normal limits. No scalp soft tissue abnormality. Sinuses/Orbits: Globes and orbital soft tissues within normal limits. Mild scattered mucosal thickening within the ethmoidal air cells. Paranasal sinuses are clear. No mastoid effusion. Other: None. MRI HEAD IMPRESSION: 1. Acute ischemic nonhemorrhagic posterior left frontal cortical infarct, left MCA distribution. No associated mass effect. 2. No other acute intracranial abnormality. 3. Multiple chronic infarcts involving the frontal and parietal lobes bilaterally as well as the right cerebellum. 4. Underlying mild chronic microvascular ischemic disease. Electronically Signed   By: Jeannine Boga MedinaD.   On: 03/28/2019 07:41  Result Date: 03/28/2019 EXAM: MRI HEAD WITHOUT CONTRAST TECHNIQUE: Multiplanar, multiecho pulse sequences of the brain and surrounding structures were obtained without intravenous contrast. COMPARISON:  None. FINDINGS: Brain: Vascular: Skull and upper cervical spine: Sinuses/Orbits: Other: Electronically Signed: By: Jeannine Boga MedinaD. On: 03/28/2019 05:04     Ct Angio Chest Aorta W/cm &/or Wo/cm Result Date: 03/28/2019 CLINICAL DATA:  Possible dissection of the descending thoracic aorta seen on ECHO. EXAM: CT ANGIOGRAPHY CHEST WITH CONTRAST TECHNIQUE: Multidetector CT imaging of the chest was performed using the Medina protocol during bolus administration of intravenous contrast. Multiplanar CT image reconstructions and MIPs were obtained to evaluate the vascular anatomy. CONTRAST:  141mL OMNIPAQUE IOHEXOL 350 MG/ML SOLN COMPARISON:  None. FINDINGS: Cardiovascular: Aneurysmal dilatation of the ascending thoracic aorta measures 3.8 cm diameter. Descending thoracic aorta is tortuous but normal in caliber. No evidence of aortic dissection. Mild eccentric noncalcified plaque within the  descending thoracic  aorta. Eccentric and partially calcified plaque is identified at the level of the upper abdominal aorta. Heart size is within normal limits. No pericardial effusion. Mediastinum/Nodes: No mass or enlarged lymph nodes seen within the mediastinum or perihilar regions. Esophagus appears normal. Trachea and central bronchi are unremarkable. Lungs/Pleura: Minimal bibasilar atelectasis. Lungs otherwise clear. No pleural effusion or pneumothorax. Upper Abdomen: Limited images of the upper abdomen are unremarkable. Musculoskeletal: Degenerative spondylosis within the slightly kyphotic lower thoracic spine, mild to moderate in degree with associated disc space narrowings, osseous spurring and articular surface sclerosis. No acute or suspicious osseous finding. Review of the MIP images confirms the above findings. IMPRESSION: 1. Aneurysmal dilatation of the ascending thoracic aorta, measuring 3.8 cm diameter, corresponding to measurements provided on today's ECHO. Descending thoracic aorta is tortuous in configuration but is normal in caliber. 2. No evidence of aortic dissection within the ascending or descending thoracic aorta. 3. No acute findings. Aortic Atherosclerosis (ICD10-I70.0). Electronically Signed   By: Franki Cabot MedinaD.   On: 03/28/2019 17:02     Ct Head Code Stroke Wo Contrast Result Date: 03/27/2019 CLINICAL DATA:  Code stroke.  Right-sided weakness. EXAM: CT HEAD WITHOUT CONTRAST TECHNIQUE: Contiguous axial images were obtained from the base of the skull through the vertex without intravenous contrast. COMPARISON:  Brain MRI 07/06/2015 FINDINGS: Brain: There is no evidence of acute infarct, intracranial hemorrhage, mass, midline shift, or extra-axial fluid collection. Chronic small to moderate-sized posterior right frontal and left parietal infarcts, a small chronic left frontal and right parietal infarcts, and a small chronic right cerebellar infarct are unchanged. Hypodensities  elsewhere in the cerebral white matter bilaterally are nonspecific but compatible with mild chronic small vessel ischemic disease. There is mild cerebral atrophy. Vascular: Calcified atherosclerosis at the skull base. No hyperdense vessel. Skull: No fracture 5 mm lucency in the left frontoparietal skull at the vertex, also present in 2008 and considered benign. Sinuses/Orbits: Mild mucosal thickening in the right frontal, bilateral ethmoid, and left sphenoid sinuses. Clear mastoid air cells. Unremarkable orbits. Other: None. ASPECTS [facility] Stroke Program Early CT Score) - Ganglionic level infarction (caudate, lentiform nuclei, internal capsule, insula, M1-M3 cortex): 7 - Supraganglionic infarction (M4-M6 cortex): 3 Total score (0-10 with 10 being normal): 10 IMPRESSION: 1. No evidence of acute intracranial abnormality. 2. ASPECTS is 10. 3. Multiple chronic infarcts, unchanged from 2016. These results were communicated to Dr. Lorraine Lax at 6:52 pm on 03/27/2019 by text page via the [facility] messaging system. Electronically Signed   By: Logan Bores MedinaD.   On: 03/27/2019 18:52    12-lead ECG SR All prior EKG's in EPIC reviewed with no documented atrial fibrillation  Telemetry SR/SB, 2 sinus pauses (3.6 and 3.3 seconds) with sinus slowing prior to each  Assessment and Plan:  1. Cryptogenic stroke The patient presents with cryptogenic stroke.  I spoke at length with the patient about monitoring for afib with either a 30 day event monitor or an implantable loop recorder.  Risks, benefits, and alteratives to implantable loop recorder were discussed with the patient today.   At this time, the patient is very clear in her decision to proceed with implantable loop recorder.   Wound care was reviewed with the patient (keep incision clean and dry for 3 days).  Wound check  Abigail Medina be  scheduled for the patient  Please call with questions.   Abigail [place], Abigail Medina 03/29/2019  I have seen and examined this patient with  Abigail Medina.  Agree with above, note  added to reflect my findings.  On exam, RRR, no murmurs, lungs clear.  Patient presented to the hospital with cryptogenic stroke. To date, no cause has been found. TEE planned for today. If unrevealing, Abigail Medina plan for LINQ monitor to look for atrial fibrillation. Risks and benefits discussed. Risks include but not limited to bleeding and infection. The patient understands the risks and has agreed to the procedure.  Abigail Birchard M. Lenice Koper MD 03/29/2019 11:53 AM

## 2019-03-29 NOTE — Progress Notes (Signed)
Nurse went over discharge with patient. Patient verbalized understanding of discharge, All questions and concerns addressed. Discharging with all belongings. Taken down in a wheelchair

## 2019-03-29 NOTE — Evaluation (Signed)
Clinical/Bedside Swallow Evaluation Patient Details  Name: Abigail Medina MRN: [id number] Date of Birth: [birth year]  Today's Date: 03/29/2019 Time: SLP Start Time (ACUTE ONLY): 50 SLP Stop Time (ACUTE ONLY): 1145 SLP Time Calculation (min) (ACUTE ONLY): 40 min  Past Medical History:  Past Medical History:  Diagnosis Date  . HIV (human immunodeficiency virus infection) ([place])   . Hypertension   . Seizures ([place])   . Stroke ([place])   . Vision abnormalities    Past Surgical History: History reviewed. No pertinent surgical history. HPI:  53 year old female admitted 03/27/2019 with RUE numbness, weakness, discoordination. 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 MRI = posterior left frontal cortical infarct, L MCA.   Assessment / Plan / Recommendation Clinical Impression  Orders received for BSE due to difficulty reported taking pills. RN reports pt seemed to struggle with taking ~10 pills at a time. Pt was encouraged to limit pills (with water) to 1-2 at a time, or place pills in puree (pudding, yogurt, applesauce, etc). Pt asked about crushing pills. SLP provided education that some pills can NOT be crushed, and that crushed pills tend to be very bitter. Recommended whole 1-2 at a time or in puree over crushing.   Trials of thin liquid, puree, and solid were given, and were tolerated without obvious oral difficulty or residue, and without overt s/s aspiraton. Pt is missing back teeth, which pt reports results in difficulty chewing some solids sufficiently. She reports globus sensation after swallowing poorly chewed meats, requiring regurgitation. Pt was encouraged to choose softer solids, cut into small pieces, and chew well to avoid this situation. No changes will be made to current diet - will continue regular solids and thin liquids. No further ST intervention for swallowing recommended at this time.   Unable to complete SLE at this time, as  transport arrived to take pt for procedure. Will continue efforts.  SLP Visit Diagnosis: Dysphagia, unspecified (R13.10)    Aspiration Risk  Mild aspiration risk    Diet Recommendation Regular;Thin liquid   Liquid Administration via: Cup;Straw Medication Administration: (whole 1-2 at a time, liquids or puree, per pt preference) Supervision: Patient able to self feed Compensations: Minimize environmental distractions;Slow rate;Small sips/bites Postural Changes: Seated upright at 90 degrees    Other  Recommendations Oral Care Recommendations: Oral care BID   Follow up Recommendations None          Prognosis Prognosis for Safe Diet Advancement: Good      Swallow Study   General Date of Onset: 03/27/19 HPI: 53 year old female admitted 03/27/2019 with RUE numbness, weakness, discoordination. 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 MRI = posterior left frontal cortical infarct, L MCA. Type of Study: Bedside Swallow Evaluation Previous Swallow Assessment: none found Diet Prior to this Study: Regular;Thin liquids Temperature Spikes Noted: No Respiratory Status: Room air History of Recent Intubation: No Behavior/Cognition: Alert;Cooperative;Pleasant mood Oral Cavity Assessment: Within Functional Limits Oral Care Completed by SLP: No Oral Cavity - Dentition: Missing dentition Vision: Functional for self-feeding Self-Feeding Abilities: Able to feed self Patient Positioning: Upright in bed Baseline Vocal Quality: Normal Volitional Cough: Strong Volitional Swallow: Able to elicit    Oral/Motor/Sensory Function Overall Oral Motor/Sensory Function: Within functional limits   Ice Chips Ice chips: Not tested   Thin Liquid Thin Liquid: Within functional limits Presentation: Straw    Nectar Thick Nectar Thick Liquid: Not tested   Honey Thick Honey Thick  Liquid: Not tested   Puree Puree: Within functional limits Presentation: Self  Fed;Spoon   Solid     Solid: Within functional limits Presentation: [place] [facility], [place] Speech Language Pathologist [phone]  Abigail Medina 03/29/2019,11:56 AM

## 2019-03-29 NOTE — Progress Notes (Signed)
STROKE TEAM PROGRESS NOTE  HPI ( Dr Aroor) : Abigail Medina is an 53 y.o. female  With PMH of seizures, HIV, HTN, HLD, CVA ( 01/2011 with residual left side deficits)  who presented to [facility] ED with c/o right side numbness and tingling. Code stroke activated in ED.  Patient was last known normal around 5:30 PM today when she was walking to get a drink.  She noticed that she had weakness of her right upper extremity difficulty controlling her arm. She is denied any weakness and walked to the ER.  He was stroke alerted by ED triage.CT head was performed.  Showed old right MCA encephalomalacia.  NIH stroke scale was 2.  Patient has history of venous angioma noted on MRI brain.  Given low NIH stroke scale with mild and nondisabling deficits and relative contraindication of venous angioma/hemosiderin deposition-decided not to pursue IV TPA.  Patient was still kept in the window and if exam were to change would reconsider IV TPA. Past Stroke history 01/2011: right MCA branch infarct; with residual left arm spasticity, left facial droop and left arm weakness.   Negative extensive work-up for stroke in young Date last known well: 03/27/2019 Time last known well: 1730 tPA Given: No: mild and non disabling symptoms/  Modified Rankin: 2   SUBJECTIVE (INTERVAL HISTORY) I have personally reviewed patient's history of presenting illness.  She had done remarkably well following her previous stroke in 2012 with only mild residual left hemiparesis.  She had initially quit smoking but admits that she has restarted smoking for last few years and she is also not been compliant with her antiplatelet therapy.  She states that her right-sided tingling appears to be improving  OBJECTIVE Vitals:   03/29/19 0024 03/29/19 0439 03/29/19 0830 03/29/19 1145  BP: (!) 154/91 (!) 138/94 (!) 141/102 (!) 128/95  Pulse: (!) 57 (!) 58 (!) 56 (!) 53  Resp: 18 19 16 17   Temp: 98.4 F (36.9 C) 98.1 F (36.7 C) 99.2 F (37.3 C) 98 F  (36.7 C)  TempSrc: Oral Oral Oral Oral  SpO2: 100% 100% 100% 100%  Weight:      Height:        CBC:  Recent Labs  Lab 03/27/19 1834  WBC 3.9*  NEUTROABS 1.5*  HGB 15.8*  HCT 47.5*  MCV 92.6  PLT 811    Basic Metabolic Panel:  Recent Labs  Lab 03/27/19 1834 03/27/19 1844 03/28/19 1012  NA 140  --  140  K 3.3*  --  3.3*  CL 101  --  106  CO2 26  --  24  GLUCOSE 87  --  105*  BUN 10  --  11  CREATININE 0.95 1.00 0.92  CALCIUM 9.6  --  8.9  MG  --   --  1.8   Ct Angio Chest Aorta W/cm &/or Wo/cm  Result Date: 03/28/2019 CLINICAL DATA:  Possible dissection of the descending thoracic aorta seen on ECHO. EXAM: CT ANGIOGRAPHY CHEST WITH CONTRAST TECHNIQUE: Multidetector CT imaging of the chest was performed using the standard protocol during bolus administration of intravenous contrast. Multiplanar CT image reconstructions and MIPs were obtained to evaluate the vascular anatomy. CONTRAST:  135mL OMNIPAQUE IOHEXOL 350 MG/ML SOLN COMPARISON:  None. FINDINGS: Cardiovascular: Aneurysmal dilatation of the ascending thoracic aorta measures 3.8 cm diameter. Descending thoracic aorta is tortuous but normal in caliber. No evidence of aortic dissection. Mild eccentric noncalcified plaque within the descending thoracic aorta. Eccentric and partially calcified plaque is  identified at the level of the upper abdominal aorta. Heart size is within normal limits. No pericardial effusion. Mediastinum/Nodes: No mass or enlarged lymph nodes seen within the mediastinum or perihilar regions. Esophagus appears normal. Trachea and central bronchi are unremarkable. Lungs/Pleura: Minimal bibasilar atelectasis. Lungs otherwise clear. No pleural effusion or pneumothorax. Upper Abdomen: Limited images of the upper abdomen are unremarkable. Musculoskeletal: Degenerative spondylosis within the slightly kyphotic lower thoracic spine, mild to moderate in degree with associated disc space narrowings, osseous spurring  and articular surface sclerosis. No acute or suspicious osseous finding. Review of the MIP images confirms the above findings. IMPRESSION: 1. Aneurysmal dilatation of the ascending thoracic aorta, measuring 3.8 cm diameter, corresponding to measurements provided on today's ECHO. Descending thoracic aorta is tortuous in configuration but is normal in caliber. 2. No evidence of aortic dissection within the ascending or descending thoracic aorta. 3. No acute findings. Aortic Atherosclerosis (ICD10-I70.0). Electronically Signed   By: Franki Cabot M.D.   On: 03/28/2019 17:02     PHYSICAL EXAM: General -mildly overweight middle-aged African-American lady in no apparent distress.  Ophthalmologic - fundi not visualized due to noncooperation.  Cardiovascular - Regular rate and rhythm.  Mental Status -  Level of arousal and orientation to time, place, and person were intact. Language including expression, naming, repetition, comprehension was assessed and found intact. Fund of Knowledge was assessed and was intact.  Cranial Nerves II - XII - II - Visual field intact OU. III, IV, VI - Extraocular movements intact. V - Facial sensation intact bilaterally. VII -mild left facial droop. VIII - Hearing & vestibular intact bilaterally. X - Palate elevates symmetrically. XI - Chin turning & shoulder shrug intact bilaterally. XII - Tongue protrusion intact.  Motor Strength - The patient's strength was 4/5 in all extremities except pronator drift exam on the right, with bilateral hand dexterity difficulty.  She orbits right over left upper extremity.  Bulk was normal and fasciculations were absent.   Motor Tone - Muscle tone was slightly increased on the left compared to the right side Reflexes - The patient's reflexes were symmetrical in all extremities and she had no pathological reflexes.  Sensory - Light touch, temperature/pinprick were assessed and were symmetrical.  Except mild subjective  paresthesias in the right upper extremity.  Coordination - The patient had normal movements in the left hand with no ataxia or dysmetria.  However, right finger-to-nose mildly ataxic. Tremor was absent.  Gait and Station - deferred.    ASSESSMENT/PLAN: Abigail Medina is a 53 y.o. female with history of seizures, HIV, HTN, HLD, CVA ( 01/2011 with residual left side deficits) presenting with RUE numbness, tingling and weakness. She did not receive IV t-PA due to mild deficits.  Stroke: posterior left frontal cortical infarct, left MCA distribution - embolic pattern - unknown source  CT head - Multiple chronic infarcts, unchanged from 2016.   MRI head - Acute posterior left frontal cortical infarct, left MCA distribution. Multiple chronic infarcts involving the frontal and parietal lobes bilaterally as well as the right cerebellum.   CTA H&N - Mild intracranial atherosclerosis. No large vessel occlusion or significant stenosis in the head and neck.   2D Echo EF > 65%, however extensive complex plaque in the descending aorta and concerning for thoracic aorta possible dissection  CT angiogram chest no evidence of aortic dissection  TEE 01/2011 no PFO  loop recorder placement today to rule out afib    LDL - 144  HgbA1c - 5.2  Hypercoagulable / autoimmune labs - homocysteine 25.7, CRP 1.0. prothrombin, MTHFR DNA, ANA, CL antibodies, Beta 2 glycoprotein pending   RPR neg in 12/2018  UDS - pending  VTE prophylaxis - Lovenox  aspirin 81 mg daily prior to admission, now on aspirin 325 mg daily. Given mild stroke, recommend aspirin 81 mg and plavix 75 mg daily x 3 weeks, then PLAVIX alone. Orders adjusted.   Patient counseled to be compliant with her antithrombotic medications  Therapy recommendations:  HH PT, HH OT  Disposition:  Return home  History of stroke  01/2011 status post TPA.  MRI showed right frontoparietal MCA acute infarct and chronic right cerebellum infarct.  MRA  negative.  EF 55 to 60%.  Due to embolic pattern, TEE done which showed no PFO.  A1c 5.1 LDL 112, patient was discharged with aspirin and Zocor.  MRI this admission showed right frontal and left parietal as well as right cerebellum chronic infarcts, indicating silent embolic strokes  Questionable thoracic aorta   TTE concerning for extensive complex plaque in the descending aorta and concerning for thoracic aorta or possible dissection  CTA chest aorta 3.8 aneurysmal dilatation of thoracic aorta. No dissection.  Likely not related to strokes  Continue aspirin for now  HIV  On HAART therapy  RPR negative in 12/2018  CD4 400 in 12/2018  HIV viral load nondetectable in 12/2018  Follow-up with ID  Hypertension  Stable . BP goal normotensive  Hyperlipidemia  Lipid lowering medication PTA:  none  LDL 144, goal < 70  Current lipid lowering medication: Lipitor 40  Continue statin at discharge  Bilateral ulnar neuropathy  04/2012 considered bilateral ulnar neuropathy  EMG in 2014 consistent with left ulnar neuropathy  06/2015 followed with Dr. Felecia Shelling, EMG showed right ulnar neuropathy  03/2017 follow with Dr. Tomi Likens, EMG showed right ulnar neuropathy and right median neuropathy  Other Stroke Risk Factors  Former cigarette smoker - quit 3 years ago but restarted recently  ETOH use, advised to drink no more than 1 alcoholic beverage per day.  Other Active Problems  Hypokalemia - 3.3 - supplemented  Leukocytopenia due to HIV - 3.9 (neutrophils 1.5)  B12 deficiency - on supplement B12 500 mcg daily  Hospital day # 1  I have personally obtained history,examined this patient, reviewed notes, independently viewed imaging studies, participated in medical decision making and plan of care.ROS completed by me personally and pertinent positives fully documented  I have made any additions or clarifications directly to the above note. Agree with note above.  Patient with known  prior history of cryptogenic stroke presents with recurrent left hemispheric cryptogenic infarct.  Agree with loop recorder for monitoring for paroxysmal A. fib.  Patient counseled to quit smoking again.  Dual antiplatelet therapy for 3 weeks followed by Plavix alone.  Aggressive risk factor modification.  Follow-up as an outpatient with stroke clinic in 6 weeks.  Discussed with Dr. Eliseo Squires.  Greater than 50% time during this 35-minute visit was spent on counseling and coordination of care about her embolic stroke and discussion about evaluation and treatment plan and answering questions. Antony Contras, MD Medical Director [facility] Stroke [facility] Pager: [phone] 03/29/2019 2:24 PM   To contact Stroke Continuity provider, please refer to [email or web address] After hours, contact General Neurology

## 2019-03-29 NOTE — Progress Notes (Signed)
Occupational Therapy Treatment Patient Details Name: Abigail Medina MRN: [id number] DOB: 1966/10/18 Today's Date: 03/29/2019    History of present illness Pt is a 53 y/o female presenting with weakness and poor cooordination of R UE. CT negative for acute findings, MRI reveals Acute ischemic nonhemorrhagic posterior left frontal cortical.  Known history of CVA with residual L sided weakness, PMH also includes: HTN, HIV, depression with anxiety, vision abnormalities.    OT comments  Patient progressing well.  Improving functional control and coordination of dominant RUE.  Able to engage in grooming tasks at sink (increased time to manage toothpaste/toothbrush) with supervision.  1 LOB when turning to transfer into recliner, min guard for safety. Provided red foam handle to support grasp during ADLs. Cont to recommend [place]. Will follow.    Follow Up Recommendations  Home health OT;Supervision/Assistance - 24 hour    Equipment Recommendations  3 in 1 bedside commode    Recommendations for Other Services      Precautions / Restrictions Precautions Precautions: Fall Restrictions Weight Bearing Restrictions: No       Mobility Bed Mobility Overal bed mobility: Modified Independent                Transfers Overall transfer level: Needs assistance   Transfers: Sit to/from Stand Sit to Stand: Supervision         General transfer comment: supervision for safety     Balance Overall balance assessment: Mild deficits observed, not formally tested                                         ADL either performed or assessed with clinical judgement   ADL Overall ADL's : Needs assistance/impaired     Grooming: Supervision/safety;Standing;Wash/dry hands;Wash/dry face;Oral care Grooming Details (indicate cue type and reason): provided red foam handle to improve grasp using R hand              Lower Body Dressing: Supervision/safety;Sit to/from stand Lower  Body Dressing Details (indicate cue type and reason): able to don socks EOB with supervision, increased time  Toilet Transfer: Supervision/safety;Ambulation;Min guard [employer] forces technical officer Details (indicate cue type and reason): simulated to recliner, min guard during turn to recliner as LOB towards L side          Functional mobility during ADLs: Supervision/safety;Min guard General ADL Comments: min guard to supervision, 1 LOB noted      Vision   Additional Comments: appears [facility]    Perception     Praxis      Cognition Arousal/Alertness: Awake/alert Behavior During Therapy: WFL for tasks assessed/performed Overall Cognitive Status: Impaired/Different from baseline Area of Impairment: Following commands;Safety/judgement;Awareness;Problem solving                       Following Commands: Follows multi-step commands consistently;Follows multi-step commands with increased time Safety/Judgement: Decreased awareness of safety Awareness: Emergent Problem Solving: Slow processing;Requires verbal cues General Comments: pt oriented today, able to follow commands, but decreased safety awareness         Exercises     Shoulder Instructions       General Comments educated on using R UE for functional tasks as much as possible, use of red foam handle for utensils, toothbrush, and pens     Pertinent Vitals/ Pain       Pain Assessment: No/denies pain  Home Living  Prior Functioning/Environment              Frequency  Min 3X/week        Progress Toward Goals  OT Goals(current goals can now be found in the care plan section)  Progress towards OT goals: Progressing toward goals     Plan Discharge plan remains appropriate;Frequency remains appropriate    Co-evaluation                 AM-PAC OT "6 Clicks" Daily Activity     Outcome Measure   Help from another person eating meals?: None Help from  another person taking care of personal grooming?: None Help from another person toileting, which includes using toliet, bedpan, or urinal?: None Help from another person bathing (including washing, rinsing, drying)?: A Little Help from another person to put on and taking off regular upper body clothing?: None Help from another person to put on and taking off regular lower body clothing?: A Little 6 Click Score: 22    End of Session    OT Visit Diagnosis: Muscle weakness (generalized) (M62.81);Other symptoms and signs involving the nervous system (R29.898)   Activity Tolerance Patient tolerated treatment well   Patient Left in chair;with call bell/phone within reach;with chair alarm set   Nurse Communication Mobility status        Time: 8889-1694 OT Time Calculation (min): 19 min  Charges: OT General Charges $OT Visit: 1 Visit OT Treatments $Self Care/Home Management : 8-22 mins  Delight Stare, [place] Pager [phone] Office [phone]    Delight Stare 03/29/2019, 4:58 PM

## 2019-03-29 NOTE — TOC Transition Note (Signed)
Transition of Care [facility]) - CM/SW Discharge Note   Patient Details  Name: Abigail Medina MRN: [id number] Date of Birth: 09-14-1966  [facility] of [facility]) CM/SW Contact:  Pollie Friar, RN Phone Number: 03/29/2019, 2:25 PM   Clinical Narrative:    Pt has transportation home.    Final next level of care: Home w Home Health Services Barriers to Discharge: No Barriers Identified   Patient Goals and CMS Choice   CMS Medicare.gov Compare Post Acute Care list provided to:: Patient Choice offered to / list presented to : Patient  Discharge Placement                       Discharge Plan and Services     Post Acute Care Choice: Home Health, Durable Medical Equipment              HH Arranged: PT, OT [facility] Agency: [place]   Social Determinants of Health (SDOH) Interventions     Readmission Risk Interventions No flowsheet data found.

## 2019-03-29 NOTE — Progress Notes (Signed)
PT Cancellation Note  Patient Details Name: Abigail Medina MRN: [id number] DOB: 11-29-1966   Cancelled Treatment:    Reason Eval/Treat Not Completed: Patient at procedure or test/unavailable. Pt off the floor getting a LOOP recorder placed. PT to return as able to reassess pt's mobility.  Kittie Plater, PT, DPT Acute Rehabilitation Services Pager #: [phone] Office #: [phone]    Berline Lopes 03/29/2019, 12:19 PM

## 2019-03-30 ENCOUNTER — Encounter (HOSPITAL_COMMUNITY): Payer: Self-pay | Admitting: Cardiology

## 2019-03-30 LAB — CARDIOLIPIN ANTIBODIES, IGG, IGM, IGA
Anticardiolipin IgA: <9 APL U/mL (ref 0–11)
Anticardiolipin IgG: <9 GPL U/mL (ref 0–14)
Anticardiolipin IgM: <9 MPL U/mL (ref 0–12)

## 2019-03-31 ENCOUNTER — Other Ambulatory Visit: Payer: Self-pay | Admitting: *Deleted

## 2019-03-31 LAB — MTHFR DNA ANALYSIS

## 2019-03-31 NOTE — Patient Outreach (Signed)
[facility]) Care Management  03/31/2019  Abigail Medina 1966-06-23 [id number]   Subjective: Telephone call to patient's home / mobile number, spoke with patient, and HIPAA verified.  Discussed [place] EMMI General Discharge Red Alert Flag follow up, patient voiced understanding, and is in agreement to follow up.  Patient states she is doing fine, does not remember getting EMMI automated calls, has the following follow up appointments scheduled: cardiologist on 04/12/2019, Infectious Disease MD on 04/13/2019, and primary MD on 04/15/2019.  Patient states she does not have any education material, EMMI follow up, care coordination, care management, disease monitoring, transportation, community resource, or pharmacy needs at this time.  States she is very appreciative of the follow up.      Objective: Per KPN (Knowledge Performance Now, point of care tool) and chart review, patient hospitalized 03/27/2019 -03/29/2019 for CVA.  Patient also has a history of hypertension, HIV, Hyperbilirubinemia,  Aneurysm of thoracic aorta, Hemiplegia, late effect of cerebrovascular disease,  Cerebral thrombosis with cerebral infarction, and History of CVA with residual deficit.      Assessment:  Received [employer] EMMI General Discharge Red Alert Flag follow up referral on 03/31/2019.   Red Alert Flag trigger, Day #1, patient answered no the following question:Scheduled a follow-up appointment?    EMMI follow up completed and no further care management needs.      Plan: RNCM will complete case closure due to follow up completed / no care management needs.      Abigail Medina H. Annia Friendly, BSN, [place] Management [facility] Telephonic CM Phone: [phone] Fax: [phone]

## 2019-04-01 ENCOUNTER — Telehealth: Payer: Self-pay | Admitting: Nurse Practitioner

## 2019-04-01 DIAGNOSIS — I712 Thoracic aortic aneurysm, without rupture: Secondary | ICD-10-CM | POA: Diagnosis not present

## 2019-04-01 DIAGNOSIS — M47814 Spondylosis without myelopathy or radiculopathy, thoracic region: Secondary | ICD-10-CM | POA: Diagnosis not present

## 2019-04-01 DIAGNOSIS — I10 Essential (primary) hypertension: Secondary | ICD-10-CM | POA: Diagnosis not present

## 2019-04-01 DIAGNOSIS — I69354 Hemiplegia and hemiparesis following cerebral infarction affecting left non-dominant side: Secondary | ICD-10-CM | POA: Diagnosis not present

## 2019-04-01 MED ORDER — METOPROLOL SUCCINATE ER 50 MG PO TB24: 50.0000 mg | ORAL_TABLET | Freq: Every day | ORAL | 1 refills | Status: DC

## 2019-04-01 NOTE — Telephone Encounter (Signed)
Pt advised to decrease Toprol to 50 mg once daily per Dr. Curt Bears. Updated Rx sent to pharmacy. Patient verbalized understanding and agreeable to plan.

## 2019-04-01 NOTE — Telephone Encounter (Signed)
LINQ alert received for pause episode on ILR 4/15 at 6:30PM. Pt remembers being "lightheaded" at that time. She has had these spells intermittently. She is currently taking Metoprolol 100mg  daily for BP. She has a BP machine at home but has not checked her BP recently. I asked her to check BP this morning and will send to [facility] and Dr Curt Bears for review. I advised her that Dr Curt Bears' nurse would call back this afternoon with medication recommendations.

## 2019-04-02 DIAGNOSIS — I712 Thoracic aortic aneurysm, without rupture: Secondary | ICD-10-CM | POA: Diagnosis not present

## 2019-04-02 DIAGNOSIS — I10 Essential (primary) hypertension: Secondary | ICD-10-CM | POA: Diagnosis not present

## 2019-04-02 DIAGNOSIS — M47814 Spondylosis without myelopathy or radiculopathy, thoracic region: Secondary | ICD-10-CM | POA: Diagnosis not present

## 2019-04-02 DIAGNOSIS — I69354 Hemiplegia and hemiparesis following cerebral infarction affecting left non-dominant side: Secondary | ICD-10-CM | POA: Diagnosis not present

## 2019-04-05 LAB — PROTHROMBIN GENE MUTATION

## 2019-04-07 DIAGNOSIS — I10 Essential (primary) hypertension: Secondary | ICD-10-CM | POA: Diagnosis not present

## 2019-04-07 DIAGNOSIS — M47814 Spondylosis without myelopathy or radiculopathy, thoracic region: Secondary | ICD-10-CM | POA: Diagnosis not present

## 2019-04-07 DIAGNOSIS — I69354 Hemiplegia and hemiparesis following cerebral infarction affecting left non-dominant side: Secondary | ICD-10-CM | POA: Diagnosis not present

## 2019-04-07 DIAGNOSIS — I712 Thoracic aortic aneurysm, without rupture: Secondary | ICD-10-CM | POA: Diagnosis not present

## 2019-04-08 ENCOUNTER — Other Ambulatory Visit: Payer: Self-pay | Admitting: *Deleted

## 2019-04-08 DIAGNOSIS — I712 Thoracic aortic aneurysm, without rupture: Secondary | ICD-10-CM | POA: Diagnosis not present

## 2019-04-08 DIAGNOSIS — I10 Essential (primary) hypertension: Secondary | ICD-10-CM | POA: Diagnosis not present

## 2019-04-08 DIAGNOSIS — I69354 Hemiplegia and hemiparesis following cerebral infarction affecting left non-dominant side: Secondary | ICD-10-CM | POA: Diagnosis not present

## 2019-04-08 DIAGNOSIS — M47814 Spondylosis without myelopathy or radiculopathy, thoracic region: Secondary | ICD-10-CM | POA: Diagnosis not present

## 2019-04-08 NOTE — Patient Outreach (Signed)
[facility]) Care Management  04/08/2019  Abigail Medina June 02, 1966 [id number]   Subjective: Telephone call to patient's home / mobile number, spoke with patient, and HIPAA verified. Discussed [place] EMMI Stroke Red Alert Flag follow up, patient voiced understanding, and is in agreement to follow up.  Patient states she is doing okay, does not remember getting EMMI automated calls, has the following follow up appointments scheduled: cardiologist on 04/12/2019, Infectious Disease MD on 04/13/2019, and primary MD on 04/15/2019.  Patient states she had a nose bleed yesterday, called providers, was advised to go to ED if nosebleed persisted, no nosebleed today, and will continue to follow up as needed.  States she has a question regarding gabapentin, does not like the way the medication makes her feel, hand pain has improved, and is planning to call neurologist today for recommendations.  States she does not feel sad, hopeless, anxious, or empty, and is doing fine. Patient states she does not have any education material, EMMI follow up, care coordination, care management, disease monitoring, transportation, community resource, or pharmacy needs at this time.  States she is very appreciative of the follow up.      Objective: Per KPN (Knowledge Performance Now, point of care tool) and chart review, patient hospitalized 03/27/2019 -03/29/2019 for CVA.  Patient also has a history of hypertension, HIV, Hyperbilirubinemia,  Aneurysm of thoracic aorta, Hemiplegia, late effect of cerebrovascular disease,  Cerebral thrombosis with cerebral infarction, and History of CVA with residual deficit.      Assessment:  Received [employer] EMMI Stroke [employer] Flag follow up referral on 04/08/2019.  Red Alert Flag triggers, Day #9, times 2, patient answered yes to the following questions: Questions/problems with meds?   Sad, hopeless, anxious, or empty?   EMMI follow  up completed and no further care management needs.      Plan: RNCM will complete case closure due to follow up completed / no care management needs.      Abigail Medina H. Annia Friendly, BSN, [place] Management [facility] Telephonic CM Phone: [phone] Fax: [phone]

## 2019-04-09 ENCOUNTER — Telehealth: Payer: Self-pay | Admitting: Cardiology

## 2019-04-09 DIAGNOSIS — I69354 Hemiplegia and hemiparesis following cerebral infarction affecting left non-dominant side: Secondary | ICD-10-CM | POA: Diagnosis not present

## 2019-04-09 DIAGNOSIS — M47814 Spondylosis without myelopathy or radiculopathy, thoracic region: Secondary | ICD-10-CM | POA: Diagnosis not present

## 2019-04-09 DIAGNOSIS — I712 Thoracic aortic aneurysm, without rupture: Secondary | ICD-10-CM | POA: Diagnosis not present

## 2019-04-09 DIAGNOSIS — I10 Essential (primary) hypertension: Secondary | ICD-10-CM | POA: Diagnosis not present

## 2019-04-09 NOTE — Telephone Encounter (Signed)
Patient daughter called and reported that patient has been feeling a fluttering. Patient daughter reports no chest pain. Unsure about ShOB and dizziness. Instructed pt daughter to send a manual transmission w/ the home monitor. Transmission received.

## 2019-04-09 NOTE — Telephone Encounter (Signed)
Spoke with daughter - pt has been having "fluttering" feelings that feel like "skipped beats". Still some dizziness but improved with lower dose of Toprol. +fatigue. She has an appt Monday for wound check. Will give symptom activator at that time. I worry if we push therapy for ectopy then she might end up with a pacemaker. Will discuss further with patient on Monday  Chanetta Marshall, NP 04/09/2019 4:45 PM

## 2019-04-12 ENCOUNTER — Telehealth: Payer: Self-pay | Admitting: Family

## 2019-04-12 ENCOUNTER — Other Ambulatory Visit: Payer: Self-pay | Admitting: *Deleted

## 2019-04-12 ENCOUNTER — Ambulatory Visit: Payer: Medicare Other

## 2019-04-12 NOTE — Telephone Encounter (Signed)
COVID-19 Pre-Screening Questions: ° °Do you currently have a fever (>100 °F), chills or unexplained body aches? No  ° °Are you currently experiencing new cough, shortness of breath, sore throat, runny nose?no   °•  °Have you recently travelled outside the state of  in the last 14 days? No  °•  °1. Have you been in contact with someone that is currently pending confirmation of Covid19 testing or has been confirmed to have the Covid19 virus?  No  ° °

## 2019-04-12 NOTE — Patient Outreach (Signed)
[facility]) Care Management  04/12/2019  Abigail Medina May 26, 1966 [id number]   Subjective:Telephone call to patient's home / mobile number, spoke with patient, and HIPAA verified. Discussed [place] EMMI Stroke Red Alert Flagfollow up, patient voiced understanding, and is in agreement to follow up. Patient states she is doing okay, remember getting EMMI automated calls, remembers speaking with this RNCM in the past, has the following follow up appointments scheduled: cardiologist on 04/12/2019 was rescheduled to 04/26/2019 because she forgot the appointment, Infectious Disease MD on 04/13/2019, and primary MD on 04/15/2019. States her first  hospital follow up appointment will be tomorrow versus today.  Patient statesshe does not have any education material, EMMI follow up, care coordination, care management, disease monitoring, transportation, community resource, or pharmacy needs at this time.States she is very appreciative of the follow up.    Objective:Per KPN (Knowledge Performance Now, point of care tool) and chart review,patient hospitalized 03/27/2019 -03/29/2019 for CVA. Patient also has a history of hypertension, HIV, Hyperbilirubinemia,Aneurysm of thoracic aorta,Hemiplegia, late effect of cerebrovascular disease,Cerebral thrombosis with cerebral infarction, andHistory of CVA with residual deficit.      Assessment: Received [employer] EMMI Stroke [employer] Flag follow up referral on 04/12/2019. Red Alert Flag trigger, Day # 13, patient answered no to the following question: Went to follow-up appointment?   EMMI follow up completed and no further care management needs.     Plan:RNCM will complete case closure due to follow up completed / no care management needs.     Abigail Medina H. Annia Friendly, BSN, [place] Management [facility] Telephonic CM Phone: [phone] Fax: [phone]

## 2019-04-13 ENCOUNTER — Encounter: Payer: Self-pay | Admitting: Family

## 2019-04-13 ENCOUNTER — Telehealth: Payer: Self-pay | Admitting: Pharmacy Technician

## 2019-04-13 ENCOUNTER — Ambulatory Visit (INDEPENDENT_AMBULATORY_CARE_PROVIDER_SITE_OTHER): Payer: Medicare Other | Admitting: Family

## 2019-04-13 ENCOUNTER — Other Ambulatory Visit: Payer: Self-pay

## 2019-04-13 VITALS — BP 117/85 | HR 61 | Temp 98.0°F | Ht 63.0 in | Wt 173.0 lb

## 2019-04-13 DIAGNOSIS — I69354 Hemiplegia and hemiparesis following cerebral infarction affecting left non-dominant side: Secondary | ICD-10-CM | POA: Diagnosis not present

## 2019-04-13 DIAGNOSIS — B20 Human immunodeficiency virus [HIV] disease: Secondary | ICD-10-CM

## 2019-04-13 DIAGNOSIS — Z1231 Encounter for screening mammogram for malignant neoplasm of breast: Secondary | ICD-10-CM | POA: Diagnosis not present

## 2019-04-13 DIAGNOSIS — I10 Essential (primary) hypertension: Secondary | ICD-10-CM

## 2019-04-13 DIAGNOSIS — Z0000 Encounter for general adult medical examination without abnormal findings: Secondary | ICD-10-CM

## 2019-04-13 DIAGNOSIS — Z9189 Other specified personal risk factors, not elsewhere classified: Secondary | ICD-10-CM | POA: Diagnosis not present

## 2019-04-13 DIAGNOSIS — M47814 Spondylosis without myelopathy or radiculopathy, thoracic region: Secondary | ICD-10-CM | POA: Diagnosis not present

## 2019-04-13 DIAGNOSIS — I712 Thoracic aortic aneurysm, without rupture: Secondary | ICD-10-CM | POA: Diagnosis not present

## 2019-04-13 MED ORDER — DOLUTEGRAVIR SODIUM 50 MG PO TABS: ORAL_TABLET | ORAL | 3 refills | Status: DC

## 2019-04-13 MED ORDER — EMTRICITABINE-TENOFOVIR AF 200-25 MG PO TABS: 1.0000 | ORAL_TABLET | Freq: Every day | ORAL | 3 refills | Status: DC

## 2019-04-13 NOTE — Assessment & Plan Note (Signed)
[name] has generally well-controlled HIV disease with good adherence and tolerance to her ART regimen of Tivicay and Descovy.  We will have her meet with our pharmacy staff to determine co-pay concerns.  No signs/symptoms of opportunistic infection or progressive HIV disease at present although has remained a continued increased risk for cardiovascular disease with most recent CVA.  Check blood work today.  Continue current dose of Tivicay and Descovy.  Plan for follow-up in 3 months or sooner if needed.

## 2019-04-13 NOTE — Progress Notes (Signed)
Subjective:    Patient ID: Abigail Medina, female    DOB: Jan 27, 1966, 53 y.o.   MRN: [id number]  Chief Complaint  Patient presents with  . Follow-up    B20     HPI:  Abigail Medina is a 53 y.o. female with HIV disease was previously seen in the office on 01/06/19 with good adherence and tolerance to her ART regimen of Descovy and Tivicay. Blood work at the time with viral load that was undetectable and CD4 count of 400. Abigail Medina was recently admitted to the hospital with acute CVA with right sided weakness which has improved since discharge.  Abigail Medina continues to take her Tivicay and Descovy as prescribed with no adverse side effects or missed doses.  She is feeling okay today. Denies fevers, chills, night sweats, headaches, changes in vision, neck pain/stiffness, nausea, diarrhea, vomiting, lesions or rashes.  Abigail Medina continues to remain covered through [facility] and has no problems obtaining her medications.  She does have questions about her co-pay with occasionally being expensive.  Denies feelings of being down, depressed, or hopeless recently.  Not currently sexually active.  No recreational or illicit drug use at present.  She is due for breast cancer screening, cervical cancer screening, and colonoscopy.  She remains on disability at this time with stable housing.   Allergies  Allergen Reactions  . Lisinopril Swelling      Outpatient Medications Prior to Visit  Medication Sig Dispense Refill  . albuterol (PROVENTIL HFA;VENTOLIN HFA) 108 (90 Base) MCG/ACT inhaler Inhale 2 puffs into the lungs every 4 (four) hours as needed for wheezing or shortness of breath (cough, shortness of breath or wheezing.). 1 Inhaler 6  . aspirin EC 81 MG EC tablet Take 1 tablet (81 mg total) by mouth daily for 21 days.    Marland Kitchen atorvastatin (LIPITOR) 40 MG tablet Take 1 tablet (40 mg total) by mouth daily at 6 PM. 30 tablet 1  . cetirizine (ZYRTEC) 10 MG tablet Take 1 tablet (10 mg total) by mouth daily.  30 tablet 11  . cimetidine (TAGAMET) 200 MG tablet Take 1 tablet (200 mg total) by mouth 2 (two) times daily. (Patient taking differently: Take 200 mg by mouth daily as needed (heartburn). ) 30 tablet 11  . clopidogrel (PLAVIX) 75 MG tablet Take 1 tablet (75 mg total) by mouth daily. 30 tablet 1  . fluticasone (FLONASE) 50 MCG/ACT nasal spray SPRAY 2 SPRAYS INTO EACH NOSTRIL EVERY DAY (Patient taking differently: Place 2 sprays into both nostrils daily. ) 16 g 2  . gabapentin (NEURONTIN) 400 MG capsule Take 1 capsule (400 mg total) by mouth 3 (three) times daily. Start taking 1 tab daily x 7d, then twice a day x 7d, then can do TID (Patient taking differently: Take 400 mg by mouth See admin instructions. Start taking 1 tab daily x 7d, then twice a day x 7d, then can take 1 capsule by mouth TID thereafter.) 90 capsule 5  . hydrochlorothiazide (HYDRODIURIL) 25 MG tablet Take 1 tablet (25 mg total) by mouth daily. 90 tablet 11  . metoprolol succinate (TOPROL-XL) 50 MG 24 hr tablet Take 1 tablet (50 mg total) by mouth daily. Take with or immediately following a meal. 90 tablet 1  . PARoxetine (PAXIL) 30 MG tablet TAKE 1 TABLET BY MOUTH EVERY DAY (Patient taking differently: Take 30 mg by mouth daily. ) 90 tablet 4  . traZODone (DESYREL) 50 MG tablet TAKE 0.5 TABLETS (25 MG TOTAL) BY MOUTH  AT BEDTIME AS NEEDED FOR SLEEP. 30 tablet 3  . vitamin B-12 (CYANOCOBALAMIN) 500 MCG tablet Take 1 tablet (500 mcg total) by mouth daily. 30 tablet 1  . Vitamin D, Ergocalciferol, (DRISDOL) 1.25 MG (50000 UT) CAPS capsule TAKE ONE CAPSULE BY MOUTH ONE TIME PER WEEK (Patient taking differently: Take 50,000 Units by mouth every 7 (seven) days. ) 8 capsule 1  . dolutegravir (TIVICAY) 50 MG tablet TAKE 1 TABLET(50 MG) BY MOUTH DAILY (Patient taking differently: Take 50 mg by mouth daily. TAKE 1 TABLET(50 MG) BY MOUTH DAILY) 30 tablet 3  . emtricitabine-tenofovir AF (DESCOVY) 200-25 MG tablet Take 1 tablet by mouth daily. 30  tablet 3   No facility-administered medications prior to visit.      Past Medical History:  Diagnosis Date  . HIV (human immunodeficiency virus infection) ([place])   . Hypertension   . Seizures ([place])   . Stroke ([place])   . Vision abnormalities      Past Surgical History:  Procedure Laterality Date  . LOOP RECORDER INSERTION N/A 03/29/2019   Procedure: LOOP RECORDER INSERTION;  Surgeon: Constance Haw, MD;  Location: [place];  Service: Cardiovascular;  Laterality: N/A;       Review of Systems  Constitutional: Negative for appetite change, chills, diaphoresis, fatigue, fever and unexpected weight change.  Eyes:       Negative for acute change in vision  Respiratory: Negative for chest tightness, shortness of breath and wheezing.   Cardiovascular: Negative for chest pain.  Gastrointestinal: Negative for diarrhea, nausea and vomiting.  Genitourinary: Negative for dysuria, pelvic pain and vaginal discharge.  Musculoskeletal: Negative for neck pain and neck stiffness.  Skin: Negative for rash.  Neurological: Negative for seizures, syncope, weakness and headaches.  Hematological: Negative for adenopathy. Does not bruise/bleed easily.  Psychiatric/Behavioral: Negative for hallucinations.      Objective:    BP 117/85   Pulse 61   Temp 98 F (36.7 C)   Ht 5\' 3"  (1.6 m)   Wt 173 lb (78.5 kg)   LMP 08/04/2014   BMI 30.65 kg/m  Nursing note and vital signs reviewed.  Physical Exam Constitutional:      General: She is not in acute distress.    Appearance: She is well-developed.  Eyes:     Conjunctiva/sclera: Conjunctivae normal.  Neck:     Musculoskeletal: Neck supple.  Cardiovascular:     Rate and Rhythm: Normal rate and regular rhythm.     Heart sounds: Normal heart sounds. No murmur. No friction rub. No gallop.   Pulmonary:     Effort: Pulmonary effort is normal. No respiratory distress.     Breath sounds: Normal breath sounds. No wheezing or rales.   Chest:     Chest wall: No tenderness.  Abdominal:     General: Bowel sounds are normal.     Palpations: Abdomen is soft.     Tenderness: There is no abdominal tenderness.  Lymphadenopathy:     Cervical: No cervical adenopathy.  Skin:    General: Skin is warm and dry.     Findings: No rash.  Neurological:     Mental Status: She is alert and oriented to person, place, and time.  Psychiatric:        Behavior: Behavior normal.        Thought Content: Thought content normal.        Judgment: Judgment normal.        Assessment & Plan:   Problem  List Items Addressed This Visit      Cardiovascular and Mediastinum   Essential hypertension    Blood pressure appears adequately controlled today.  Continue medications with adjustments per primary care.        Other   Human immunodeficiency virus (HIV) disease ([place]) - Primary (Chronic)    Abigail Medina has generally well-controlled HIV disease with good adherence and tolerance to her ART regimen of Tivicay and Descovy.  We will have her meet with our pharmacy staff to determine co-pay concerns.  No signs/symptoms of opportunistic infection or progressive HIV disease at present although has remained a continued increased risk for cardiovascular disease with most recent CVA.  Check blood work today.  Continue current dose of Tivicay and Descovy.  Plan for follow-up in 3 months or sooner if needed.      Relevant Medications   dolutegravir (TIVICAY) 50 MG tablet   emtricitabine-tenofovir AF (DESCOVY) 200-25 MG tablet   Other Relevant Orders   HIV-1 RNA quant-no reflex-bld   T-helper cell (CD4)- ([facility] clinic only)   COMPLETE METABOLIC PANEL WITH GFR   Healthcare maintenance     Due for multiple screenings including cervical cancer, breast cancer, and colonoscopy.  Perspective referral sent to complete malignant screenings.  Discussed importance of safe sexual practice to reduce risk of acquisition/transmission of STI.       Other Visit  Diagnoses    Encounter for screening mammogram for breast cancer       Relevant Orders   MM DIGITAL SCREENING BILATERAL   At risk for colon cancer       Relevant Orders   Ambulatory referral to Gastroenterology       I am having Abigail Medina maintain her cimetidine, traZODone, hydrochlorothiazide, cetirizine, gabapentin, albuterol, Vitamin D (Ergocalciferol), PARoxetine, fluticasone, aspirin, atorvastatin, clopidogrel, vitamin B-12, metoprolol succinate, dolutegravir, and emtricitabine-tenofovir AF.   Meds ordered this encounter  Medications  . dolutegravir (TIVICAY) 50 MG tablet    Sig: TAKE 1 TABLET(50 MG) BY MOUTH DAILY    Dispense:  30 tablet    Refill:  3    Please mail Rx to patient    Order Specific Question:   Supervising Provider    Answer:   Carlyle Basques [[id number]]  . emtricitabine-tenofovir AF (DESCOVY) 200-25 MG tablet    Sig: Take 1 tablet by mouth daily.    Dispense:  30 tablet    Refill:  3    Order Specific Question:   Supervising Provider    Answer:   Carlyle Basques [[id number]]     Follow-up: Return in about 3 months (around 07/13/2019), or if symptoms worsen or fail to improve.   Terri Piedra, MSN, FNP-C Nurse Practitioner [facility] for Infectious Disease [place] number: [phone]

## 2019-04-13 NOTE — Assessment & Plan Note (Signed)
Blood pressure appears adequately controlled today.  Continue medications with adjustments per primary care.

## 2019-04-13 NOTE — Telephone Encounter (Addendum)
[facility] Patient Advocate Encounter   I was successful in securing patient a $7200 grant from [employer] (PAF) to provide copayment coverage for Descovy and Tivicay.  This will make the out of pocket cost $0.     I have spoken with the patient.    The billing information is as follows and has been shared with [employer].   RxBin: [id number] PCN: [id number] Member ID: [id number] Group ID: [id number] Dates of Eligibility: 04/13/2019 through 04/12/2020  Patient knows to call the office with questions or concerns.  Abigail Medina. [name] Patient [facility] for Infectious Disease Phone: [phone] Fax:  [phone]

## 2019-04-13 NOTE — Assessment & Plan Note (Signed)
   Due for multiple screenings including cervical cancer, breast cancer, and colonoscopy.  Perspective referral sent to complete malignant screenings.  Discussed importance of safe sexual practice to reduce risk of acquisition/transmission of STI.

## 2019-04-13 NOTE — Patient Instructions (Signed)
Nice to meet you.  We will check your blood work today.  Continue to take your Tivicay and Descovy.   We will get you set up for appointments for your colonoscopy, mammogram and Pap smear.  Plan for follow up in 3 months or sooner if needed with lab work same day.

## 2019-04-14 ENCOUNTER — Telehealth: Payer: Self-pay | Admitting: Infectious Diseases

## 2019-04-14 LAB — T-HELPER CELL (CD4) - (RCID CLINIC ONLY)
CD4 % Helper T Cell: 40 % (ref 33–65)
CD4 T Cell Abs: 448 /uL (ref 400–1790)

## 2019-04-14 NOTE — Telephone Encounter (Signed)
COVID-19 Pre-Screening Questions: ° °Do you currently have a fever (>100 °F), chills or unexplained body aches? No  ° °Are you currently experiencing new cough, shortness of breath, sore throat, runny nose? No  °•  °Have you recently travelled outside the state of  in the last 14 days?no  °•  °Have you been in contact with someone that is currently pending confirmation of Covid19 testing or has been confirmed to have the Covid19 virus?  No  °

## 2019-04-15 ENCOUNTER — Other Ambulatory Visit: Payer: Self-pay

## 2019-04-15 ENCOUNTER — Other Ambulatory Visit (HOSPITAL_COMMUNITY)
Admission: RE | Admit: 2019-04-15 | Discharge: 2019-04-15 | Disposition: A | Discharge status: Home / Self Care | Payer: Medicare Other | Source: Ambulatory Visit | Attending: Infectious Diseases | Admitting: Infectious Diseases

## 2019-04-15 ENCOUNTER — Encounter: Payer: Self-pay | Admitting: Infectious Diseases

## 2019-04-15 ENCOUNTER — Ambulatory Visit (INDEPENDENT_AMBULATORY_CARE_PROVIDER_SITE_OTHER): Payer: Medicare Other | Admitting: Family Medicine

## 2019-04-15 ENCOUNTER — Ambulatory Visit (INDEPENDENT_AMBULATORY_CARE_PROVIDER_SITE_OTHER): Payer: Medicare Other | Admitting: Infectious Diseases

## 2019-04-15 DIAGNOSIS — I6930 Unspecified sequelae of cerebral infarction: Secondary | ICD-10-CM

## 2019-04-15 DIAGNOSIS — I10 Essential (primary) hypertension: Secondary | ICD-10-CM

## 2019-04-15 DIAGNOSIS — B20 Human immunodeficiency virus [HIV] disease: Secondary | ICD-10-CM

## 2019-04-15 DIAGNOSIS — Z124 Encounter for screening for malignant neoplasm of cervix: Secondary | ICD-10-CM | POA: Diagnosis not present

## 2019-04-15 DIAGNOSIS — Z1151 Encounter for screening for human papillomavirus (HPV): Secondary | ICD-10-CM | POA: Diagnosis not present

## 2019-04-15 NOTE — Progress Notes (Signed)
      Subjective:    Abigail Medina is a 53 y.o. female here for an annual pelvic exam and pap smear.   Review of Systems: Current GYN complaints or concerns: None.  She does have a history of hemorrhoids and has some itching periodically. Sexually active with one female partner.  Condom use intermittently..  Patient denies any abdominal/pelvic pain, problems with bowel movements, urination, vaginal discharge or intercourse.   Past Medical History:  Diagnosis Date  . HIV (human immunodeficiency virus infection) ([place])   . Hypertension   . Seizures ([place])   . Stroke ([place])   . Vision abnormalities     Gynecologic History: No obstetric history on file.  Patient's last menstrual period was 08/04/2014. Contraception: post menopausal status Last Pap: 08/2014. Results were: normal Anal Intercourse: No Last Mammogram: Scheduled for June 10, 2019.  Results on 04/14/2017 were normal.  Objective:  Physical Exam  Constitutional: Well developed, well nourished, no acute distress. She is alert and oriented x3.  Pelvic: External genitalia is normal in appearance. The vagina is normal in appearance. The cervix is bulbous and easily visualized. No CMT, normal expected cervical mucus present. Bimanual exam reveals uterus that is felt to be normal size, shape, and contour. No adnexal masses or tenderness noted. Breasts: symmetrical in contour, shape and texture. No palpable masses/nodules. No nipple discharge.  Psych: She has a normal mood and affect.    Assessment:  Normal pelvic and bimanual exam. Normal clinical breast exam.   Plan:  Health Maintenance =   ThinPrep was obtained and sent for cytology with reflex HPV and gonorrhea chlamydia today.  We will also check for bacterial vaginosis with complaint of itching.  She has a little bit of thicker white cervical discharge, no odor, small quantity.   Discussed recommended screening interval for women living with HIV disease.  I think with her  history of more than 3 consecutive normal Pap smears she would be a good candidate for every 3 years for indefinite cervical cancer screening.  Results will be communicated to the patient via phone call  She has been counseled and instructed how to perform monthly self breast exams.  Screening mammogram has already been scheduled.   Contraception / Family Planning =   Menopausal  HIV =   She will continue her Tivicay and Descovy and F/U as scheduled with Terri Piedra for ongoing HIV care.   Janene Madeira, MSN, NP-C [facility] for Infectious [place] Group Office: [phone] Pager: [phone]  04/15/19 2:21 PM

## 2019-04-15 NOTE — Progress Notes (Signed)
  Patient Abigail Medina Internal Medicine and S[facility]le Cell Care  Virtual Visit via Telephone Note  I connected with Abigail Medina on 04/15/19 at 11:00 AM EDT by telephone and verified that I am speaking with the correct person using two identifiers.   I discussed the limitations, risks, security and privacy concerns of performing an evaluation and management service by telephone and the availability of in person appointments. I also discussed with the patient that there may be a patient responsible charge related to this service. The patient expressed understanding and agreed to proceed.   History of Present Illness: Abigail Medina  has a past medical history of HIV (human immunodeficiency virus infection) ([place]), Hypertension, Seizures ([place]), Stroke ([place]), and Vision abnormalities. Patient reports having a stroke 03/28/2019. States that she was hospitalized from 03/27/2019-03/29/2019. She is followed by ID and neurology. She reports compliance with medications and denies side effects at the present time.   Observations/Objective: Patient with regular voice tone, rate and rhythm. Speaking calmly and is in no apparent distress.    Assessment and Plan:  1. Essential hypertension  2. Human immunodeficiency virus (HIV) disease ([place])  3. History of CVA with residual deficit No medication changes warranted at the present time.  Patient is being followed by a specialist for this condition. Patient advised to continue with follow up appointments. PCP will continue to monitor progress.     Follow Up Instructions:  We discussed hand washing, using hand sanitizer when soap and water are not available, only going out when absolutely necessary, and social distancing. Explained to patient that she is immunocompromised and will need to take precautions during this time.   I discussed the assessment and treatment plan with the patient. The patient was provided an opportunity to ask questions and all were  answered. The patient agreed with the plan and demonstrated an understanding of the instructions.   The patient was advised to call back or seek an in-person evaluation if the symptoms worsen or if the condition fails to improve as anticipated.  I provided 8 minutes of non-face-to-face time during this encounter.  [name], FNP-BC [facility] [address] [place], [place] [address] [phone]

## 2019-04-20 DIAGNOSIS — E785 Hyperlipidemia, unspecified: Secondary | ICD-10-CM | POA: Diagnosis not present

## 2019-04-20 DIAGNOSIS — M5030 Other cervical disc degeneration, unspecified cervical region: Secondary | ICD-10-CM | POA: Diagnosis not present

## 2019-04-20 DIAGNOSIS — M79605 Pain in left leg: Secondary | ICD-10-CM | POA: Diagnosis not present

## 2019-04-20 DIAGNOSIS — B370 Candidal stomatitis: Secondary | ICD-10-CM | POA: Diagnosis not present

## 2019-04-20 DIAGNOSIS — I071 Rheumatic tricuspid insufficiency: Secondary | ICD-10-CM | POA: Diagnosis not present

## 2019-04-20 DIAGNOSIS — I77819 Aortic ectasia, unspecified site: Secondary | ICD-10-CM | POA: Diagnosis not present

## 2019-04-20 DIAGNOSIS — M4606 Spinal enthesopathy, lumbar region: Secondary | ICD-10-CM | POA: Diagnosis not present

## 2019-04-20 DIAGNOSIS — I708 Atherosclerosis of other arteries: Secondary | ICD-10-CM | POA: Diagnosis not present

## 2019-04-20 DIAGNOSIS — M47814 Spondylosis without myelopathy or radiculopathy, thoracic region: Secondary | ICD-10-CM | POA: Diagnosis not present

## 2019-04-20 DIAGNOSIS — I259 Chronic ischemic heart disease, unspecified: Secondary | ICD-10-CM | POA: Diagnosis not present

## 2019-04-20 DIAGNOSIS — G9389 Other specified disorders of brain: Secondary | ICD-10-CM | POA: Diagnosis not present

## 2019-04-20 DIAGNOSIS — I712 Thoracic aortic aneurysm, without rupture: Secondary | ICD-10-CM | POA: Diagnosis not present

## 2019-04-20 DIAGNOSIS — E559 Vitamin D deficiency, unspecified: Secondary | ICD-10-CM | POA: Diagnosis not present

## 2019-04-20 DIAGNOSIS — I10 Essential (primary) hypertension: Secondary | ICD-10-CM | POA: Diagnosis not present

## 2019-04-20 DIAGNOSIS — I272 Pulmonary hypertension, unspecified: Secondary | ICD-10-CM | POA: Diagnosis not present

## 2019-04-20 DIAGNOSIS — J309 Allergic rhinitis, unspecified: Secondary | ICD-10-CM | POA: Diagnosis not present

## 2019-04-20 DIAGNOSIS — L210 Seborrhea capitis: Secondary | ICD-10-CM | POA: Diagnosis not present

## 2019-04-20 DIAGNOSIS — E876 Hypokalemia: Secondary | ICD-10-CM | POA: Diagnosis not present

## 2019-04-20 DIAGNOSIS — J069 Acute upper respiratory infection, unspecified: Secondary | ICD-10-CM | POA: Diagnosis not present

## 2019-04-20 DIAGNOSIS — G5620 Lesion of ulnar nerve, unspecified upper limb: Secondary | ICD-10-CM | POA: Diagnosis not present

## 2019-04-20 DIAGNOSIS — I69354 Hemiplegia and hemiparesis following cerebral infarction affecting left non-dominant side: Secondary | ICD-10-CM | POA: Diagnosis not present

## 2019-04-20 DIAGNOSIS — J3489 Other specified disorders of nose and nasal sinuses: Secondary | ICD-10-CM | POA: Diagnosis not present

## 2019-04-20 DIAGNOSIS — J9811 Atelectasis: Secondary | ICD-10-CM | POA: Diagnosis not present

## 2019-04-20 LAB — COMPLETE METABOLIC PANEL WITH GFR
AG Ratio: 1.4 (calc) (ref 1.0–2.5)
ALT: 20 U/L (ref 6–29)
AST: 17 U/L (ref 10–35)
Albumin: 3.7 g/dL (ref 3.6–5.1)
Alkaline phosphatase (APISO): 62 U/L (ref 37–153)
BUN: 12 mg/dL (ref 7–25)
CO2: 26 mmol/L (ref 20–32)
Calcium: 8.7 mg/dL (ref 8.6–10.4)
Chloride: 105 mmol/L (ref 98–110)
Creat: 0.84 mg/dL (ref 0.50–1.05)
GFR, Est African American: 91 mL/min/{1.73_m2} (ref >=60)
GFR, Est Non African American: 78 mL/min/{1.73_m2} (ref >=60)
Globulin: 2.6 g/dL (calc) (ref 1.9–3.7)
Glucose, Bld: 93 mg/dL (ref 65–99)
Potassium: 3.4 mmol/L — ABNORMAL LOW (ref 3.5–5.3)
Sodium: 140 mmol/L (ref 135–146)
Total Bilirubin: 0.6 mg/dL (ref 0.2–1.2)
Total Protein: 6.3 g/dL (ref 6.1–8.1)

## 2019-04-20 LAB — HIV-1 RNA QUANT-NO REFLEX-BLD
HIV 1 RNA Quant: <20 copies/mL — AB
HIV-1 RNA Quant, Log: <1.3 Log copies/mL — AB

## 2019-04-22 LAB — CYTOLOGY - PAP
Adequacy: ABSENT
Diagnosis: NEGATIVE
HPV: NOT DETECTED

## 2019-04-26 ENCOUNTER — Other Ambulatory Visit: Payer: Self-pay

## 2019-04-26 ENCOUNTER — Ambulatory Visit (INDEPENDENT_AMBULATORY_CARE_PROVIDER_SITE_OTHER): Payer: Medicare Other | Admitting: Student

## 2019-04-26 DIAGNOSIS — I639 Cerebral infarction, unspecified: Secondary | ICD-10-CM

## 2019-04-26 LAB — CUP PACEART INCLINIC DEVICE CHECK
Date Time Interrogation Session: 20200511114045
Implantable Pulse Generator Implant Date: 20200413

## 2019-04-26 NOTE — Progress Notes (Signed)
ILR wound check in clinic. Steri strips fell off previously. Wound well healed. Home monitor transmitting nightly. One episode; Pause. Previously addressed; See Phone Note from 04/01/2019. Pt given symptom activator. R wave 0.13 mV. Questions answered. Monthly summary reports and ROV prn.   Legrand Como [address] [place], PA-C 04/26/2019 11:41 AM

## 2019-05-03 ENCOUNTER — Other Ambulatory Visit: Payer: Self-pay

## 2019-05-03 ENCOUNTER — Ambulatory Visit (INDEPENDENT_AMBULATORY_CARE_PROVIDER_SITE_OTHER): Payer: Medicare Other | Admitting: *Deleted

## 2019-05-03 DIAGNOSIS — I639 Cerebral infarction, unspecified: Secondary | ICD-10-CM | POA: Diagnosis not present

## 2019-05-03 LAB — CUP PACEART REMOTE DEVICE CHECK
Date Time Interrogation Session: 20200516154050
Implantable Pulse Generator Implant Date: 20200413

## 2019-05-06 ENCOUNTER — Telehealth: Payer: Self-pay

## 2019-05-06 NOTE — Telephone Encounter (Signed)
Called and spoke with patient, she states the medications she was calling about have been filled. She didn't need anything else at this time. Thanks !

## 2019-05-11 ENCOUNTER — Telehealth: Payer: Self-pay | Admitting: Cardiology

## 2019-05-11 ENCOUNTER — Encounter: Payer: Self-pay | Admitting: Family Medicine

## 2019-05-11 NOTE — Telephone Encounter (Signed)
Discussed findings w/ pt. Advised to continue to monitor and reminded pt of using symptom recorder, per Dr. Curt Bears. Patient verbalized understanding and agreeable to plan.

## 2019-05-11 NOTE — Telephone Encounter (Signed)
Spoke to patient. Pt reported she was eating steak around the time of the 4 second pause @ 2030 on 05/10/19 and she had difficulty swallowing a piece of steak. After she began to cough and was able to swallow the food she became dizzy for "a few minutes". Pt reported dizziness lasted  1-2 minutes. No symptoms at this time. Pt did not have symptom recorder with her and was educated on carrying device with her to note any symptomatic events that may occur. Understanding of education verbalized.

## 2019-05-12 NOTE — Progress Notes (Signed)
Carelink Summary Report / Loop Recorder 

## 2019-05-13 ENCOUNTER — Telehealth: Payer: Self-pay | Admitting: Cardiology

## 2019-05-13 NOTE — Telephone Encounter (Signed)
If swallowing, likely vagal response. If further episodes not vagal Abigail Medina need eval for possible pacer.

## 2019-05-13 NOTE — Telephone Encounter (Signed)
Pt used symptom activator on 05/12/19 @ 1424. Pt had 4 second pause. Pt reported she was eating and had difficulty swallowing food at time of the event. Pt did not report any symptoms just that she had difficulty swallowing the food and decided to make sure event was noted.

## 2019-05-16 ENCOUNTER — Other Ambulatory Visit: Payer: Self-pay | Admitting: Family Medicine

## 2019-05-16 DIAGNOSIS — E559 Vitamin D deficiency, unspecified: Secondary | ICD-10-CM

## 2019-05-18 ENCOUNTER — Other Ambulatory Visit: Payer: Self-pay

## 2019-05-18 ENCOUNTER — Encounter: Payer: Self-pay | Admitting: Gastroenterology

## 2019-05-18 ENCOUNTER — Ambulatory Visit (INDEPENDENT_AMBULATORY_CARE_PROVIDER_SITE_OTHER): Payer: Medicare Other | Admitting: Gastroenterology

## 2019-05-18 VITALS — Ht 63.0 in | Wt 160.0 lb

## 2019-05-18 DIAGNOSIS — K921 Melena: Secondary | ICD-10-CM | POA: Diagnosis not present

## 2019-05-18 DIAGNOSIS — Z7902 Long term (current) use of antithrombotics/antiplatelets: Secondary | ICD-10-CM

## 2019-05-18 DIAGNOSIS — Z1211 Encounter for screening for malignant neoplasm of colon: Secondary | ICD-10-CM | POA: Diagnosis not present

## 2019-05-18 DIAGNOSIS — Z1212 Encounter for screening for malignant neoplasm of rectum: Secondary | ICD-10-CM | POA: Diagnosis not present

## 2019-05-18 NOTE — Progress Notes (Signed)
History of Present Illness: This is a 53 year old female referred by Golden Circle, FNP for the evaluation of CRC screening on Plavix. Hospitalized for acute CVA in March 27, 2019 presenting with right-sided weakness. 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  She has made a very good recovery and has felt well for the past few weeks.  She relates a long history of intermittent small amounts of bright red blood with bowel movements that she attributes to hemorrhoids.  The symptoms have not changed over several years.  No other gastrointestinal complaints.  No prior colonoscopy. Denies weight loss, abdominal pain, constipation, diarrhea, change in stool caliber, melena, hematochezia, nausea, vomiting, dysphagia, reflux symptoms, chest pain.     Allergies  Allergen Reactions  . Lisinopril Swelling   Outpatient Medications Prior to Visit  Medication Sig Dispense Refill  . albuterol (PROVENTIL HFA;VENTOLIN HFA) 108 (90 Base) MCG/ACT inhaler Inhale 2 puffs into the lungs every 4 (four) hours as needed for wheezing or shortness of breath (cough, shortness of breath or wheezing.). 1 Inhaler 6  . atorvastatin (LIPITOR) 40 MG tablet Take 1 tablet (40 mg total) by mouth daily at 6 PM. 30 tablet 1  . cetirizine (ZYRTEC) 10 MG tablet Take 1 tablet (10 mg total) by mouth daily. 30 tablet 11  . cimetidine (TAGAMET) 200 MG tablet Take 1 tablet (200 mg total) by mouth 2 (two) times daily. (Patient taking differently: Take 200 mg by mouth daily as needed (heartburn). ) 30 tablet 11  . clopidogrel (PLAVIX) 75 MG tablet Take 1 tablet (75 mg total) by mouth daily. 30 tablet 1  . dolutegravir (TIVICAY) 50 MG tablet TAKE 1 TABLET(50 MG) BY MOUTH DAILY 30 tablet 3  . emtricitabine-tenofovir AF (DESCOVY) 200-25 MG tablet Take 1 tablet by mouth daily. 30 tablet 3  . fluticasone (FLONASE) 50 MCG/ACT nasal spray SPRAY 2 SPRAYS INTO EACH NOSTRIL EVERY DAY (Patient taking differently: Place 2 sprays  into both nostrils daily. ) 16 g 2  . gabapentin (NEURONTIN) 400 MG capsule Take 1 capsule (400 mg total) by mouth 3 (three) times daily. Start taking 1 tab daily x 7d, then twice a day x 7d, then can do TID (Patient taking differently: Take 400 mg by mouth See admin instructions. Start taking 1 tab daily x 7d, then twice a day x 7d, then can take 1 capsule by mouth TID thereafter.) 90 capsule 5  . hydrochlorothiazide (HYDRODIURIL) 25 MG tablet Take 1 tablet (25 mg total) by mouth daily. 90 tablet 11  . metoprolol succinate (TOPROL-XL) 50 MG 24 hr tablet Take 1 tablet (50 mg total) by mouth daily. Take with or immediately following a meal. 90 tablet 1  . PARoxetine (PAXIL) 30 MG tablet TAKE 1 TABLET BY MOUTH EVERY DAY (Patient taking differently: Take 30 mg by mouth daily. ) 90 tablet 4  . traZODone (DESYREL) 50 MG tablet TAKE 0.5 TABLETS (25 MG TOTAL) BY MOUTH AT BEDTIME AS NEEDED FOR SLEEP. 30 tablet 3  . vitamin B-12 (CYANOCOBALAMIN) 500 MCG tablet Take 1 tablet (500 mcg total) by mouth daily. 30 tablet 1  . Vitamin D, Ergocalciferol, (DRISDOL) 1.25 MG (50000 UT) CAPS capsule TAKE 1 CAPSULE BY MOUTH ONCE A WEEK 8 capsule 1   No facility-administered medications prior to visit.    Past Medical History:  Diagnosis Date  . HIV (human immunodeficiency virus infection) ([place])   . Hypertension   . Seizures ([place])   . Stroke ([place])   .  Vision abnormalities    Past Surgical History:  Procedure Laterality Date  . LOOP RECORDER INSERTION N/A 03/29/2019   Procedure: LOOP RECORDER INSERTION;  Surgeon: Constance Haw, MD;  Location: [facility];  Service: Cardiovascular;  Laterality: N/A;   Social History   Socioeconomic History  . Marital status: Single    Spouse name: Not on file  . Number of children: Not on file  . Years of education: Not on file  . Highest education level: Not on file  Occupational History  . Not on file  Social Needs  . Financial resource strain: Not on file   . Food insecurity:    Worry: Not on file    Inability: Not on file  . Transportation needs:    Medical: Not on file    Non-medical: Not on file  Tobacco Use  . Smoking status: Former Smoker    Packs/day: 0.25    Types: Cigarettes    Start date: 10/17/2015  . Smokeless tobacco: Never Used  Substance and Sexual Activity  . Alcohol use: Yes    Alcohol/week: 2.0 - 3.0 standard drinks    Types: 2 - 3 Standard drinks or equivalent per week    Comment: liquor or beer; stated has increased with social meetings  . Drug use: No  . Sexual activity: Yes    Partners: Male    Birth control/protection: Condom    Comment: accepted condoms  Lifestyle  . Physical activity:    Days per week: Not on file    Minutes per session: Not on file  . Stress: Not on file  Relationships  . Social connections:    Talks on phone: Not on file    Gets together: Not on file    Attends religious service: Not on file    Active member of club or organization: Not on file    Attends meetings of clubs or organizations: Not on file    Relationship status: Not on file  Other Topics Concern  . Not on file  Social History Narrative  . Not on file   Family History  Problem Relation Age of Onset  . Hypertension Mother        Review of Systems: Pertinent positive and negative review of systems were noted in the above HPI section. All other review of systems were otherwise negative.    Physical Exam: Telemedicine - not performed   Assessment and Recommendations:  1. CRC screening, average risk.  Intermittent small-volume hematochezia likely secondary to hemorrhoids.  Rule out colorectal neoplasms, proctitis and other disorders.  Colonoscopy will need to wait at least 3 months after CVA and also when cleared to hold Plavix for colonoscopy. Will call patient in late July to reassess and plan to schedule colonoscopy in August, September 2020.   2. CVA in April 2020. Hold Plavix 5 days before procedure - will  instruct when and how to resume after procedure. Low but real risk of cardiovascular event such as heart attack, stroke, embolism, thrombosis or ischemia/infarct of other organs off Plavix explained and need to seek urgent help if this occurs. The patient consents to proceed. Will communicate by phone or EMR with patient's prescribing provider to confirm that holding Plavix is reasonable in this case.     These services were provided via telemedicine, audio and video.  The patient was at home and the provider was in the office, alone.  We discussed the limitations of evaluation and management by telemedicine and the  availability of in person appointments.  Patient consented for this telemedicine visit and is aware of possible charges for this service.  Office CMA or LPN participated in this telemedicine service.  Time spent on call: 10 minutes     cc: Golden Circle, FNP [address] [place], [place] [address]

## 2019-05-18 NOTE — Patient Instructions (Signed)
We will contact you in late July to schedule your Colonoscopy off Plavix for August/September.   Thank you for choosing me and [facility].  Pricilla Riffle. Dagoberto Ligas., MD., Marval Regal

## 2019-05-25 ENCOUNTER — Telehealth: Payer: Self-pay

## 2019-05-25 NOTE — Telephone Encounter (Signed)
I called pt that her visit will be change to video due to COVID 19. Pt gave her verbal consent to do video and to file insurance. Pt has a [employer] phone and t mobile as her carrier. I explain the doxy process and to click link 10 minutes before visit. PT receive the link while on phone with nurse. She verbalized understanding.

## 2019-05-27 ENCOUNTER — Encounter: Payer: Self-pay | Admitting: Adult Health

## 2019-05-27 ENCOUNTER — Ambulatory Visit (INDEPENDENT_AMBULATORY_CARE_PROVIDER_SITE_OTHER): Payer: Medicare Other | Admitting: Adult Health

## 2019-05-27 ENCOUNTER — Other Ambulatory Visit: Payer: Self-pay

## 2019-05-27 VITALS — BP 129/92

## 2019-05-27 DIAGNOSIS — R42 Dizziness and giddiness: Secondary | ICD-10-CM

## 2019-05-27 DIAGNOSIS — I10 Essential (primary) hypertension: Secondary | ICD-10-CM

## 2019-05-27 DIAGNOSIS — E785 Hyperlipidemia, unspecified: Secondary | ICD-10-CM | POA: Diagnosis not present

## 2019-05-27 DIAGNOSIS — I639 Cerebral infarction, unspecified: Secondary | ICD-10-CM

## 2019-05-27 NOTE — Progress Notes (Signed)
[facility] [address] [place]. [place] [address] [phone]       VIRTUAL VISIT FOLLOW UP NOTE  [name] Date of Birth:  Dec 19, 1965 Medical Record Number:  [id number]   Reason for Referral:  hospital stroke follow up    Virtual Visit via Video Note  I connected with Abigail Medina on 05/27/19 at 10:15 AM EDT by a video enabled telemedicine application located remotely in my own home and verified that I am speaking with the correct person using two identifiers who was located at their own home.   Visit scheduled by Lemont Fillers, RN. She discussed the limitations of evaluation and management by telemedicine and the availability of in person appointments. The patient expressed understanding and agreed to proceed.Please see telephone note for additional scheduling information and consent.    CHIEF COMPLAINT:  Chief Complaint  Patient presents with   Follow-up    hospital stroke follow up   Dizziness    Intermittent episodes of dizziness/lightheadedness    HPI: Abigail Medina was initially scheduled today for in office hospital follow-up regarding posterior left frontal cortical infarct left MCA distribution embolic pattern on 0/25/4270 secondary to unknown source therefore loop recorder placed but due to COVID-19 safety precautions, visit transition to telemedicine via [email or web address] with patients consent. History obtained from patient and chart review. Reviewed all radiology images and labs personally.  [name] is a 53 y.o. 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 She did not receive IV t-PA due to mild deficits.  Stroke work-up revealed posterior left frontal cortical infarcts with left MCA distribution embolic pattern secondary to unknown source as evidenced on MRI.  Vessel imaging mild arthrosclerosis but otherwise unremarkable.  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  TEE performed in 01/2011 which was negative for PFO therefore loop recorder placed to rule out atrial fibrillation.  Recommended DAPT for 3 weeks and Plavix alone.  LDL 144 and A1c 5.2.  Hypercoagulable work-up negative.  Initiated atorvastatin 40 mg daily.  HTN stable.  Other stroke risk factors include HIV, prior history of stroke, former tobacco use and EtOH use.  Discharged home in stable condition recommendation of home health therapies.  She has been stable from a stroke standpoint. Complaints of intermittent dizziness sensation worse in the morning and after eating. Pause episodes have been noted on loop recorder associated with lightheaded feeling.  Denies any other neurological symptom.  Episodes subside rapidly.  Decreased metoprolol dose from 100mg  daily to 50mg  daily. She denies improvement of these episodes with dose reduction. Blood pressure monitored at home with todays reading 129/92 without taking AM medications. At times can be lower especially after medication administration.  Unsure if dizziness/lightheadedness episodes are related to hypotension.  She has continued on aspirin and Plavix despite recommendation of 3-week duration but denies bleeding or bruising.  Continues on atorvastatin without side effects myalgias. Loop recorder has not shown atrial fibrillation thus far.  No further concerns at this time.  Denies new or worsening stroke/TIA symptoms.  ROS:   14 system review of systems performed and negative with exception of dizziness  PMH:  Past Medical History:  Diagnosis Date   HIV (human immunodeficiency virus infection) ([place])    Hypertension    Seizures ([place])    Stroke ([place])  Vision abnormalities     PSH:  Past Surgical History:  Procedure Laterality Date   LOOP RECORDER INSERTION N/A 03/29/2019   Procedure: LOOP RECORDER INSERTION;   Surgeon: Constance Haw, MD;  Location: [facility];  Service: Cardiovascular;  Laterality: N/A;    Social History:  Social History   Socioeconomic History   Marital status: Single    Spouse name: Not on file   Number of children: Not on file   Years of education: Not on file   Highest education level: Not on file  Occupational History   Not on file  Social Needs   Financial resource strain: Not on file   Food insecurity    Worry: Not on file    Inability: Not on file   Transportation needs    Medical: Not on file    Non-medical: Not on file  Tobacco Use   Smoking status: Former Smoker    Packs/day: 0.25    Types: Cigarettes    Start date: 10/17/2015   Smokeless tobacco: Never Used  Substance and Sexual Activity   Alcohol use: Yes    Alcohol/week: 2.0 - 3.0 standard drinks    Types: 2 - 3 Standard drinks or equivalent per week    Comment: liquor or beer; stated has increased with social meetings   Drug use: No   Sexual activity: Yes    Partners: Male    Birth control/protection: Condom    Comment: accepted condoms  Lifestyle   Physical activity    Days per week: Not on file    Minutes per session: Not on file   Stress: Not on file  Relationships   Social connections    Talks on phone: Not on file    Gets together: Not on file    Attends religious service: Not on file    Active member of club or organization: Not on file    Attends meetings of clubs or organizations: Not on file    Relationship status: Not on file   Intimate partner violence    Fear of current or ex partner: Not on file    Emotionally abused: Not on file    Physically abused: Not on file    Forced sexual activity: Not on file  Other Topics Concern   Not on file  Social History Narrative   Not on file    Family History:  Family History  Problem Relation Age of Onset   Hypertension Mother     Medications:   Current Outpatient Medications on File Prior  to Visit  Medication Sig Dispense Refill   albuterol (PROVENTIL HFA;VENTOLIN HFA) 108 (90 Base) MCG/ACT inhaler Inhale 2 puffs into the lungs every 4 (four) hours as needed for wheezing or shortness of breath (cough, shortness of breath or wheezing.). 1 Inhaler 6   atorvastatin (LIPITOR) 40 MG tablet Take 1 tablet (40 mg total) by mouth daily at 6 PM. 30 tablet 1   cetirizine (ZYRTEC) 10 MG tablet Take 1 tablet (10 mg total) by mouth daily. 30 tablet 11   cimetidine (TAGAMET) 200 MG tablet Take 1 tablet (200 mg total) by mouth 2 (two) times daily. (Patient taking differently: Take 200 mg by mouth daily as needed (heartburn). ) 30 tablet 11   clopidogrel (PLAVIX) 75 MG tablet Take 1 tablet (75 mg total) by mouth daily. 30 tablet 1   dolutegravir (TIVICAY) 50 MG tablet TAKE 1 TABLET(50 MG) BY MOUTH DAILY 30 tablet 3   emtricitabine-tenofovir  AF (DESCOVY) 200-25 MG tablet Take 1 tablet by mouth daily. 30 tablet 3   fluticasone (FLONASE) 50 MCG/ACT nasal spray SPRAY 2 SPRAYS INTO EACH NOSTRIL EVERY DAY (Patient taking differently: Place 2 sprays into both nostrils daily. ) 16 g 2   gabapentin (NEURONTIN) 400 MG capsule Take 1 capsule (400 mg total) by mouth 3 (three) times daily. Start taking 1 tab daily x 7d, then twice a day x 7d, then can do TID (Patient taking differently: Take 400 mg by mouth See admin instructions. Start taking 1 tab daily x 7d, then twice a day x 7d, then can take 1 capsule by mouth TID thereafter.) 90 capsule 5   hydrochlorothiazide (HYDRODIURIL) 25 MG tablet Take 1 tablet (25 mg total) by mouth daily. 90 tablet 11   metoprolol succinate (TOPROL-XL) 50 MG 24 hr tablet Take 1 tablet (50 mg total) by mouth daily. Take with or immediately following a meal. 90 tablet 1   PARoxetine (PAXIL) 30 MG tablet TAKE 1 TABLET BY MOUTH EVERY DAY (Patient taking differently: Take 30 mg by mouth daily. ) 90 tablet 4   traZODone (DESYREL) 50 MG tablet TAKE 0.5 TABLETS (25 MG TOTAL) BY  MOUTH AT BEDTIME AS NEEDED FOR SLEEP. 30 tablet 3   vitamin B-12 (CYANOCOBALAMIN) 500 MCG tablet Take 1 tablet (500 mcg total) by mouth daily. 30 tablet 1   Vitamin D, Ergocalciferol, (DRISDOL) 1.25 MG (50000 UT) CAPS capsule TAKE 1 CAPSULE BY MOUTH ONCE A WEEK 8 capsule 1   No current facility-administered medications on file prior to visit.     Allergies:   Allergies  Allergen Reactions   Lisinopril Swelling     Physical Exam  Vitals:   05/27/19 1141  BP: (!) 129/92   There is no height or weight on file to calculate BMI. No exam data present  Depression screen [facility] 2/9 04/13/2019  Decreased Interest 0  Down, Depressed, Hopeless 0  PHQ - 2 Score 0  Altered sleeping -  Tired, decreased energy -  Change in appetite -  Feeling bad or failure about yourself  -  Trouble concentrating -  Moving slowly or fidgety/restless -  Suicidal thoughts -  PHQ-9 Score -  Difficult doing work/chores -  Some recent data might be hidden     General: well developed, well nourished, pleasant middle-aged African-American female, seated, in no evident distress Head: head normocephalic and atraumatic.    Neurologic Exam Mental Status: Awake and fully alert. Oriented to place and time. Recent and remote memory intact. Attention span, concentration and fund of knowledge appropriate. Mood and affect appropriate.  Cranial Nerves: Extraocular movements full without nystagmus. Hearing intact to voice. Facial sensation intact. Face, tongue, palate moves normally and symmetrically.  Shoulder shrug symmetric. Motor: No evidence of weakness per drift assessment Sensory.: intact to light touch Coordination: Rapid alternating movements normal in all extremities. Finger-to-nose and heel-to-shin performed accurately bilaterally. Gait and Station: Arises from chair without difficulty. Stance is normal. Gait demonstrates normal stride length and balance .  Reflexes: UTA   NIHSS  0 Modified Rankin   0      ASSESSMENT: Abigail Medina is a 53 y.o. year old female here with posterior left frontal cortical infarct within left MCA distribution embolic pattern on 5/88/5027 secondary to unknown source therefore loop recorder placed. Vascular risk factors include seizures, HIV, HTN, HLD, prior stroke.  Loop recorder has not shown atrial fibrillation thus far.  Stable from stroke standpoint without residual deficits.  Complains of intermittent dizziness sensation associated with pauses found on loop recorder.    PLAN:  1. Embolic infarct: Continue clopidogrel 75 mg daily  and atorvastatin for secondary stroke prevention.  Advised to discontinue aspirin at this time and continue on Plavix alone as no indication for prolonged DAPT.  Maintain strict control of hypertension with blood pressure goal below 130/90, diabetes with hemoglobin A1c goal below 6.5% and cholesterol with LDL cholesterol (bad cholesterol) goal below 70 mg/dL.  I also advised the patient to eat a healthy diet with plenty of whole grains, cereals, fruits and vegetables, exercise regularly with at least 30 minutes of continuous activity daily and maintain ideal body weight.  Continue to monitor loop recorder for atrial fibrillation. 2. Dizziness: Likely unrelated to stroke and likely more cardiac related.  Advised her to continue to follow with cardiology in regards to potentially lowering antihypertensive dose with satisfactory blood pressure prior to medication administration 3. HTN: Advised to continue current treatment regimen.  Today's BP 129/92.  Advised to continue to monitor at home along with continued follow-up with PCP for management.  4. HLD: Advised to continue current treatment regimen along with continued follow-up with PCP for future prescribing and monitoring of lipid panel 5. Patient interested in [place] trial and would like additional information.  Research team notified.   Follow up in 3 months or call earlier if  needed   Greater than 50% of time during this 30 minute non-face-to-face visit was spent on counseling, explanation of diagnosis of embolic infarct, reviewing risk factor management of HTN and HLD, discussion regarding likelihood of dizziness episodes being cardiac related, discussion regarding loop recorder, planning of further management along with potential future management, and discussion with patient and family answering all questions.    Venancio Poisson, AGNP-BC  [facility] [address] [place], [place] [address]  Phone [phone] Fax [phone] Note: This document was prepared with digital dictation and possible smart phrase technology. Any transcriptional errors that result from this process are unintentional.

## 2019-05-27 NOTE — Progress Notes (Signed)
I agree with the above plan 

## 2019-05-27 NOTE — Patient Instructions (Signed)
Continue clopidogrel 75 mg daily  and atorvastatin for secondary stroke prevention  Discontinue aspirin and continue on Plavix alone  Continue to follow up with PCP regarding cholesterol and blood pressure management    Ongoing follow-up with cardiology regarding dizziness episodes and possibility of decreasing antihypertensives  Continue to monitor loop recorder  Continue to stay active and maintain a healthy diet  Continue to monitor blood pressure at home  Maintain strict control of hypertension with blood pressure goal below 130/90, diabetes with hemoglobin A1c goal below 6.5% and cholesterol with LDL cholesterol (bad cholesterol) goal below 70 mg/dL. I also advised the patient to eat a healthy diet with plenty of whole grains, cereals, fruits and vegetables, exercise regularly and maintain ideal body weight.  Followup in the future with me in 3 months or call earlier if needed       Thank you for coming to see [place] at [facility]. I hope we have been able to provide you high quality care today.  You may receive a patient satisfaction survey over the next few weeks. We would appreciate your feedback and comments so that we may continue to improve ourselves and the health of our patients

## 2019-06-03 ENCOUNTER — Ambulatory Visit (INDEPENDENT_AMBULATORY_CARE_PROVIDER_SITE_OTHER): Payer: Medicare Other | Admitting: *Deleted

## 2019-06-03 DIAGNOSIS — I63132 Cerebral infarction due to embolism of left carotid artery: Secondary | ICD-10-CM | POA: Diagnosis not present

## 2019-06-03 LAB — CUP PACEART REMOTE DEVICE CHECK
Date Time Interrogation Session: 20200618151558
Implantable Pulse Generator Implant Date: 20200413

## 2019-06-08 NOTE — Progress Notes (Signed)
Carelink Summary Report / Loop Recorder 

## 2019-06-10 ENCOUNTER — Other Ambulatory Visit: Payer: Self-pay

## 2019-06-10 ENCOUNTER — Ambulatory Visit
Admission: RE | Admit: 2019-06-10 | Discharge: 2019-06-10 | Disposition: A | Discharge status: Home / Self Care | Payer: Medicare Other | Source: Ambulatory Visit | Attending: Family | Admitting: Family

## 2019-06-10 DIAGNOSIS — Z1231 Encounter for screening mammogram for malignant neoplasm of breast: Secondary | ICD-10-CM

## 2019-06-10 MED ORDER — CLOPIDOGREL BISULFATE 75 MG PO TABS: 75.0000 mg | ORAL_TABLET | Freq: Every day | ORAL | 1 refills | Status: DC

## 2019-06-10 MED ORDER — CYANOCOBALAMIN 500 MCG PO TABS: 500.0000 ug | ORAL_TABLET | Freq: Every day | ORAL | 1 refills | Status: DC

## 2019-07-02 ENCOUNTER — Telehealth: Payer: Self-pay

## 2019-07-02 NOTE — Telephone Encounter (Signed)
Left message for disconnected monitor

## 2019-07-04 ENCOUNTER — Other Ambulatory Visit: Payer: Self-pay | Admitting: Family Medicine

## 2019-07-06 ENCOUNTER — Ambulatory Visit (INDEPENDENT_AMBULATORY_CARE_PROVIDER_SITE_OTHER): Payer: Medicare Other | Admitting: *Deleted

## 2019-07-06 DIAGNOSIS — I63132 Cerebral infarction due to embolism of left carotid artery: Secondary | ICD-10-CM

## 2019-07-06 LAB — CUP PACEART REMOTE DEVICE CHECK
Date Time Interrogation Session: 20200721164007
Implantable Pulse Generator Implant Date: 20200413

## 2019-07-08 ENCOUNTER — Other Ambulatory Visit: Payer: Self-pay | Admitting: Infectious Diseases

## 2019-07-12 ENCOUNTER — Telehealth: Payer: Self-pay

## 2019-07-12 NOTE — Telephone Encounter (Signed)
Spoke to pt regarding LINQ pause alert. Pause detected 07/11/19 @ 1247, 3 second duration. Pt asymptomatic. Will route to Dr. Curt Bears for review.

## 2019-07-12 NOTE — Telephone Encounter (Signed)
If asymptomatic continue to monitor.

## 2019-07-14 ENCOUNTER — Other Ambulatory Visit: Payer: Self-pay | Admitting: Infectious Diseases

## 2019-07-15 ENCOUNTER — Ambulatory Visit: Payer: Medicare Other | Admitting: Family Medicine

## 2019-07-19 ENCOUNTER — Ambulatory Visit: Payer: Medicare Other | Admitting: Internal Medicine

## 2019-07-21 NOTE — Progress Notes (Signed)
Carelink Summary Report / Loop Recorder 

## 2019-07-26 ENCOUNTER — Ambulatory Visit (INDEPENDENT_AMBULATORY_CARE_PROVIDER_SITE_OTHER): Payer: Medicare Other | Admitting: Family Medicine

## 2019-07-26 ENCOUNTER — Other Ambulatory Visit: Payer: Self-pay

## 2019-07-26 ENCOUNTER — Encounter: Payer: Self-pay | Admitting: Family Medicine

## 2019-07-26 VITALS — BP 118/84 | HR 60 | Temp 98.2°F | Resp 14 | Ht 63.0 in | Wt 163.0 lb

## 2019-07-26 DIAGNOSIS — I10 Essential (primary) hypertension: Secondary | ICD-10-CM | POA: Diagnosis not present

## 2019-07-26 DIAGNOSIS — I63132 Cerebral infarction due to embolism of left carotid artery: Secondary | ICD-10-CM

## 2019-07-26 DIAGNOSIS — Z1211 Encounter for screening for malignant neoplasm of colon: Secondary | ICD-10-CM | POA: Diagnosis not present

## 2019-07-26 DIAGNOSIS — E785 Hyperlipidemia, unspecified: Secondary | ICD-10-CM | POA: Diagnosis not present

## 2019-07-26 LAB — POCT URINALYSIS DIPSTICK
Blood, UA: NEGATIVE
Glucose, UA: NEGATIVE
Leukocytes, UA: NEGATIVE
Nitrite, UA: NEGATIVE
Protein, UA: POSITIVE — AB
Spec Grav, UA: >=1.03 — AB (ref 1.010–1.025)
Urobilinogen, UA: 1 E.U./dL
pH, UA: 6 (ref 5.0–8.0)

## 2019-07-26 MED ORDER — ATORVASTATIN CALCIUM 40 MG PO TABS: 40.0000 mg | ORAL_TABLET | Freq: Every day | ORAL | 1 refills | Status: DC

## 2019-07-26 NOTE — Patient Instructions (Signed)
Health Maintenance, Female Adopting a healthy lifestyle and getting preventive care are important in promoting health and wellness. Ask your health care provider about:  The right schedule for you to have regular tests and exams.  Things you can do on your own to prevent diseases and keep yourself healthy. What should I know about diet, weight, and exercise? Eat a healthy diet   Eat a diet that includes plenty of vegetables, fruits, low-fat dairy products, and lean protein.  Do not eat a lot of foods that are high in solid fats, added sugars, or sodium. Maintain a healthy weight Body mass index (BMI) is used to identify weight problems. It estimates body fat based on height and weight. Your health care provider can help determine your BMI and help you achieve or maintain a healthy weight. Get regular exercise Get regular exercise. This is one of the most important things you can do for your health. Most adults should:  Exercise for at least 150 minutes each week. The exercise should increase your heart rate and make you sweat (moderate-intensity exercise).  Do strengthening exercises at least twice a week. This is in addition to the moderate-intensity exercise.  Spend less time sitting. Even light physical activity can be beneficial. Watch cholesterol and blood lipids Have your blood tested for lipids and cholesterol at 55 years of age, then have this test every 5 years. Have your cholesterol levels checked more often if:  Your lipid or cholesterol levels are high.  You are older than 55 years of age.  You are at high risk for heart disease. What should I know about cancer screening? Depending on your health history and family history, you may need to have cancer screening at various ages. This may include screening for:  Breast cancer.  Cervical cancer.  Colorectal cancer.  Skin cancer.  Lung cancer. What should I know about heart disease, diabetes, and high blood  pressure? Blood pressure and heart disease  High blood pressure causes heart disease and increases the risk of stroke. This is more likely to develop in people who have high blood pressure readings, are of African descent, or are overweight.  Have your blood pressure checked: ? Every 3-5 years if you are 18-39 years of age. ? Every year if you are 40 years old or older. Diabetes Have regular diabetes screenings. This checks your fasting blood sugar level. Have the screening done:  Once every three years after age 40 if you are at a normal weight and have a low risk for diabetes.  More often and at a younger age if you are overweight or have a high risk for diabetes. What should I know about preventing infection? Hepatitis B If you have a higher risk for hepatitis B, you should be screened for this virus. Talk with your health care provider to find out if you are at risk for hepatitis B infection. Hepatitis C Testing is recommended for:  Everyone born from 1945 through 1965.  Anyone with known risk factors for hepatitis C. Sexually transmitted infections (STIs)  Get screened for STIs, including gonorrhea and chlamydia, if: ? You are sexually active and are younger than 55 years of age. ? You are older than 55 years of age and your health care provider tells you that you are at risk for this type of infection. ? Your sexual activity has changed since you were last screened, and you are at increased risk for chlamydia or gonorrhea. Ask your health care provider if   you are at risk.  Ask your health care provider about whether you are at high risk for HIV. Your health care provider may recommend a prescription medicine to help prevent HIV infection. If you choose to take medicine to prevent HIV, you should first get tested for HIV. You should then be tested every 3 months for as long as you are taking the medicine. Pregnancy  If you are about to stop having your period (premenopausal) and  you may become pregnant, seek counseling before you get pregnant.  Take 400 to 800 micrograms (mcg) of folic acid every day if you become pregnant.  Ask for birth control (contraception) if you want to prevent pregnancy. Osteoporosis and menopause Osteoporosis is a disease in which the bones lose minerals and strength with aging. This can result in bone fractures. If you are 67 years old or older, or if you are at risk for osteoporosis and fractures, ask your health care provider if you should:  Be screened for bone loss.  Take a calcium or vitamin D supplement to lower your risk of fractures.  Be given hormone replacement therapy (HRT) to treat symptoms of menopause. Follow these instructions at home: Lifestyle  Do not use any products that contain nicotine or tobacco, such as cigarettes, e-cigarettes, and chewing tobacco. If you need help quitting, ask your health care provider.  Do not use street drugs.  Do not share needles.  Ask your health care provider for help if you need support or information about quitting drugs. Alcohol use  Do not drink alcohol if: ? Your health care provider tells you not to drink. ? You are pregnant, may be pregnant, or are planning to become pregnant.  If you drink alcohol: ? Limit how much you use to 0-1 drink a day. ? Limit intake if you are breastfeeding.  Be aware of how much alcohol is in your drink. In the [place] one drink equals one 12 oz bottle of beer (355 mL), one 5 oz glass of wine (148 mL), or one 1 oz glass of hard liquor (44 mL). General instructions  Schedule regular health, dental, and eye exams.  Stay current with your vaccines.  Tell your health care provider if: ? You often feel depressed. ? You have ever been abused or do not feel safe at home. Summary  Adopting a healthy lifestyle and getting preventive care are important in promoting health and wellness.  Follow your health care provider's instructions about healthy  diet, exercising, and getting tested or screened for diseases.  Follow your health care provider's instructions on monitoring your cholesterol and blood pressure. This information is not intended to replace advice given to you by your health care provider. Make sure you discuss any questions you have with your health care provider. Document Released: 06/17/2011 Document Revised: 11/25/2018 Document Reviewed: 11/25/2018 Elsevier Patient Education  2020 [employer]. Stroke Prevention Some medical conditions and lifestyle choices can lead to a higher risk for a stroke. You can help to prevent a stroke by making nutrition, lifestyle, and other changes. What nutrition changes can be made?   Eat healthy foods. ? Choose foods that are high in fiber. These include:  Fresh fruits.  Fresh vegetables.  Whole grains. ? Eat at least 5 or more servings of fruits and vegetables each day. Try to fill half of your plate at each meal with fruits and vegetables. ? Choose lean protein foods. These include:  Lowfat (lean) cuts of meat.  Chicken without skin.  Fish.  Tofu.  Beans.  Nuts. ? Eat low-fat dairy products. ? Avoid foods that:  Are high in salt (sodium).  Have saturated fat.  Have trans fat.  Have cholesterol.  Are processed.  Are premade.  Follow eating guidelines as told by your doctor. These may include: ? Reducing how many calories you eat and drink each day. ? Limiting how much salt you eat or drink each day to 1,500 milligrams (mg). ? Using only healthy fats for cooking. These include:  Olive oil.  Canola oil.  Sunflower oil. ? Counting how many carbohydrates you eat and drink each day. What lifestyle changes can be made?  Try to stay at a healthy weight. Talk to your doctor about what a good weight is for you.  Get at least 30 minutes of moderate physical activity at least 5 days a week. This can include: ? Fast walking. ? Biking. ? Swimming.  Do not  use any products that have nicotine or tobacco. This includes cigarettes and e-cigarettes. If you need help quitting, ask your doctor. Avoid being around tobacco smoke in general.  Limit how much alcohol you drink to no more than 1 drink a day for nonpregnant women and 2 drinks a day for men. One drink equals 12 oz of beer, 5 oz of wine, or 1 oz of hard liquor.  Do not use drugs.  Avoid taking birth control pills. Talk to your doctor about the risks of taking birth control pills if: ? You are over 73 years old. ? You smoke. ? You get migraines. ? You have had a blood clot. What other changes can be made?  Manage your cholesterol. ? It is important to eat a healthy diet. ? If your cholesterol cannot be managed through your diet, you may also need to take medicines. Take medicines as told by your doctor.  Manage your diabetes. ? It is important to eat a healthy diet and to exercise regularly. ? If your blood sugar cannot be managed through diet and exercise, you may need to take medicines. Take medicines as told by your doctor.  Control your high blood pressure (hypertension). ? Try to keep your blood pressure below 130/80. This can help lower your risk of stroke. ? It is important to eat a healthy diet and to exercise regularly. ? If your blood pressure cannot be managed through diet and exercise, you may need to take medicines. Take medicines as told by your doctor. ? Ask your doctor if you should check your blood pressure at home. ? Have your blood pressure checked every year. Do this even if your blood pressure is normal.  Talk to your doctor about getting checked for a sleep disorder. Signs of this can include: ? Snoring a lot. ? Feeling very tired.  Take over-the-counter and prescription medicines only as told by your doctor. These may include aspirin or blood thinners (antiplatelets or anticoagulants).  Make sure that any other medical conditions you have are managed. Where  to find more information  American Stroke Association: www.strokeassociation.org  National Stroke Association: www.stroke.org Get help right away if:  You have any symptoms of stroke. "BE FAST" is an easy way to remember the main warning signs: ? B - Balance. Signs are dizziness, sudden trouble walking, or loss of balance. ? E - Eyes. Signs are trouble seeing or a sudden change in how you see. ? F - Face. Signs are sudden weakness or loss of feeling of the face, or the  face or eyelid drooping on one side. ? A - Arms. Signs are weakness or loss of feeling in an arm. This happens suddenly and usually on one side of the body. ? S - Speech. Signs are sudden trouble speaking, slurred speech, or trouble understanding what people say. ? T - Time. Time to call emergency services. Write down what time symptoms started.  You have other signs of stroke, such as: ? A sudden, very bad headache with no known cause. ? Feeling sick to your stomach (nausea). ? Throwing up (vomiting). ? Jerky movements you cannot control (seizure). These symptoms may represent a serious problem that is an emergency. Do not wait to see if the symptoms will go away. Get medical help right away. Call your local emergency services (911 in the [place] Do not drive yourself to the hospital. Summary  You can prevent a stroke by eating healthy, exercising, not smoking, drinking less alcohol, and treating other health problems, such as diabetes, high blood pressure, or high cholesterol.  Do not use any products that contain nicotine or tobacco, such as cigarettes and e-cigarettes.  Get help right away if you have any signs or symptoms of a stroke. This information is not intended to replace advice given to you by your health care provider. Make sure you discuss any questions you have with your health care provider. Document Released: 06/02/2012 Document Revised: 01/28/2019 Document Reviewed: 03/05/2017 Elsevier Patient Education   2020 [employer].

## 2019-07-26 NOTE — Progress Notes (Signed)
  Patient [facility]   Progress Note: General Provider: Lanae Boast, FNP  SUBJECTIVE:   Abigail Medina is a 53 y.o. female who  has a past medical history of HIV (human immunodeficiency virus infection) ([place]), Hypertension, Seizures ([place]), Stroke ([place]), and Vision abnormalities.. Patient presents today for Hypertension and Hyperlipidemia  Patient reports having a stroke 03/28/2019. States that she was hospitalized from 03/27/2019-03/29/2019. She is followed by ID and neurology. She reports that she forgets to take Lipitor at night. Denies side effects from medication at the present time.  Review of Systems  Constitutional: Negative.   HENT: Negative.   Eyes: Negative.   Respiratory: Negative.   Cardiovascular: Negative.   Gastrointestinal: Negative.   Genitourinary: Negative.   Musculoskeletal: Negative.   Skin: Negative.   Neurological: Negative.   Psychiatric/Behavioral: Negative.      OBJECTIVE: BP 118/84 (BP Location: Left Arm, Patient Position: Sitting, Cuff Size: Normal)   Pulse 60   Temp 98.2 F (36.8 C) (Oral)   Resp 14   Ht 5\' 3"  (1.6 m)   Wt 163 lb (73.9 kg)   LMP 08/04/2014   SpO2 100%   BMI 28.87 kg/m   Wt Readings from Last 3 Encounters:  07/26/19 163 lb (73.9 kg)  05/18/19 160 lb (72.6 kg)  04/13/19 173 lb (78.5 kg)     Physical Exam Vitals signs and nursing note reviewed.  Constitutional:      General: She is not in acute distress.    Appearance: Normal appearance.  HENT:     Head: Normocephalic and atraumatic.  Eyes:     Extraocular Movements: Extraocular movements intact.     Conjunctiva/sclera: Conjunctivae normal.     Pupils: Pupils are equal, round, and reactive to light.  Cardiovascular:     Rate and Rhythm: Normal rate and regular rhythm.     Heart sounds: No murmur.  Pulmonary:     Effort: Pulmonary effort is normal.     Breath sounds: Normal breath sounds.  Musculoskeletal: Normal range of  motion.  Skin:    General: Skin is warm and dry.  Neurological:     Mental Status: She is alert and oriented to person, place, and time.  Psychiatric:        Mood and Affect: Mood normal.        Behavior: Behavior normal.        Thought Content: Thought content normal.        Judgment: Judgment normal.     ASSESSMENT/PLAN:   1. Essential hypertension - Urinalysis Dipstick  2. 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 Hyperlipidemia, unspecified hyperlipidemia type - Lipid Panel  4. Screen for colon cancer - Ambulatory referral to Gastroenterology - [facility] COLONOSCOPY   Patient to continue with specialist appointments.   Return in about 6 months (around 01/26/2020) for htn and cholesterol.    The patient was given clear instructions to go to ER or return to medical center if symptoms do not improve, worsen or new problems develop. The patient verbalized understanding and agreed with plan of care.   [name]. [name], FNP-BC [facility] [address] [place], [place] [address] [phone]

## 2019-07-27 ENCOUNTER — Telehealth: Payer: Self-pay

## 2019-07-27 LAB — LIPID PANEL
Chol/HDL Ratio: 3.6 ratio (ref 0.0–4.4)
Cholesterol, Total: 199 mg/dL (ref 100–199)
HDL: 56 mg/dL (ref >39)
LDL Calculated: 121 mg/dL — ABNORMAL HIGH (ref 0–99)
Triglycerides: 112 mg/dL (ref 0–149)
VLDL Cholesterol Cal: 22 mg/dL (ref 5–40)

## 2019-07-27 NOTE — Telephone Encounter (Signed)
Patient called to ask if cholesterol medication was sent to pharmacy yesterday. I do see it was sent in and patient was advised. Thanks!

## 2019-07-28 NOTE — Progress Notes (Signed)
Your labs are stable. Continue with your current medications. Please remember to keep your follow up appointment. If you have problems, questions or concerns, please make an appointment to discuss. Thanks!

## 2019-07-30 ENCOUNTER — Other Ambulatory Visit: Payer: Self-pay | Admitting: Family Medicine

## 2019-08-04 ENCOUNTER — Other Ambulatory Visit: Payer: Self-pay

## 2019-08-04 ENCOUNTER — Ambulatory Visit (INDEPENDENT_AMBULATORY_CARE_PROVIDER_SITE_OTHER): Payer: Medicare Other | Admitting: Internal Medicine

## 2019-08-04 ENCOUNTER — Encounter: Payer: Self-pay | Admitting: Internal Medicine

## 2019-08-04 VITALS — BP 145/98 | HR 65 | Temp 98.2°F

## 2019-08-04 DIAGNOSIS — G4700 Insomnia, unspecified: Secondary | ICD-10-CM

## 2019-08-04 DIAGNOSIS — B20 Human immunodeficiency virus [HIV] disease: Secondary | ICD-10-CM | POA: Diagnosis not present

## 2019-08-04 DIAGNOSIS — Z79899 Other long term (current) drug therapy: Secondary | ICD-10-CM

## 2019-08-04 MED ORDER — TIVICAY 50 MG PO TABS: ORAL_TABLET | ORAL | 3 refills | Status: DC

## 2019-08-04 MED ORDER — TRAZODONE HCL 50 MG PO TABS: 25.0000 mg | ORAL_TABLET | Freq: Every evening | ORAL | 3 refills | Status: DC | PRN

## 2019-08-04 MED ORDER — DESCOVY 200-25 MG PO TABS: 1.0000 | ORAL_TABLET | Freq: Every day | ORAL | 3 refills | Status: DC

## 2019-08-04 NOTE — Patient Instructions (Signed)
Make sure to call clinic in 4 wk to get flu vaccine appt.  Continue to wear masks in public, wash hands and stay at least 6 feet from others in common spaces

## 2019-08-04 NOTE — Progress Notes (Signed)
Patient ID: Abigail Medina, female   DOB: 02/20/1966, 53 y.o.   MRN: [id number]  HPI 53 yo F with hiv disease, CD 4 count 448/VL<20 - April 2020, on tivicay/descovy - well controlled. She is doing well overall with not missing any doses. No covid 19 exposure that she knows of.  Outpatient Encounter Medications as of 08/04/2019  Medication Sig  . albuterol (PROVENTIL HFA;VENTOLIN HFA) 108 (90 Base) MCG/ACT inhaler Inhale 2 puffs into the lungs every 4 (four) hours as needed for wheezing or shortness of breath (cough, shortness of breath or wheezing.).  Marland Kitchen atorvastatin (LIPITOR) 40 MG tablet Take 1 tablet (40 mg total) by mouth daily at 6 PM.  . cetirizine (ZYRTEC) 10 MG tablet Take 1 tablet (10 mg total) by mouth daily.  . cimetidine (TAGAMET) 200 MG tablet Take 1 tablet (200 mg total) by mouth 2 (two) times daily. (Patient taking differently: Take 200 mg by mouth daily as needed (heartburn). )  . clopidogrel (PLAVIX) 75 MG tablet TAKE 1 TABLET BY MOUTH EVERY DAY  . [facility] B-12 500 MCG tablet TAKE 1 TABLET BY MOUTH EVERY DAY  . dolutegravir (TIVICAY) 50 MG tablet TAKE 1 TABLET(50 MG) BY MOUTH DAILY  . emtricitabine-tenofovir AF (DESCOVY) 200-25 MG tablet Take 1 tablet by mouth daily.  . fluticasone (FLONASE) 50 MCG/ACT nasal spray SPRAY 2 SPRAYS INTO EACH NOSTRIL EVERY DAY  . gabapentin (NEURONTIN) 400 MG capsule Take 1 capsule (400 mg total) by mouth 3 (three) times daily. Start taking 1 tab daily x 7d, then twice a day x 7d, then can do TID (Patient taking differently: Take 400 mg by mouth See admin instructions. Start taking 1 tab daily x 7d, then twice a day x 7d, then can take 1 capsule by mouth TID thereafter.)  . hydrochlorothiazide (HYDRODIURIL) 25 MG tablet Take 1 tablet (25 mg total) by mouth daily.  . metoprolol succinate (TOPROL-XL) 50 MG 24 hr tablet Take 1 tablet (50 mg total) by mouth daily. Take with or immediately following a meal.  . PARoxetine (PAXIL) 30 MG tablet TAKE 1  TABLET BY MOUTH EVERY DAY (Patient taking differently: Take 30 mg by mouth daily. )  . traZODone (DESYREL) 50 MG tablet TAKE 0.5 TABLETS (25 MG TOTAL) BY MOUTH AT BEDTIME AS NEEDED FOR SLEEP.  . Vitamin D, Ergocalciferol, (DRISDOL) 1.25 MG (50000 UT) CAPS capsule TAKE 1 CAPSULE BY MOUTH ONCE A WEEK   No facility-administered encounter medications on file as of 08/04/2019.      Patient Active Problem List   Diagnosis Date Noted  . Healthcare maintenance 04/13/2019  . Aneurysm of thoracic aorta ([place]) 03/29/2019  . Cerebral thrombosis with cerebral infarction 03/28/2019  . CVA (cerebral vascular accident) ([place]) 03/28/2019  . Acute focal neurological deficit 03/27/2019  . Hypokalemia 03/27/2019  . Hyperbilirubinemia 03/27/2019  . History of CVA with residual deficit   . Pain of back and left lower extremity 12/15/2018  . Upper respiratory infection, viral 12/15/2018  . 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 Tobacco dependence 06/02/2015  . Numbness and tingling of right arm 05/30/2015  . Right arm weakness 05/30/2015  . Arm numbness left 10/10/2014  . Vitamin D deficiency 08/05/2014  . Allergic rhinitis 05/25/2013  . 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 Polypharmacy 08/20/2012  . HIV (human immunodeficiency virus infection) ([place]) 08/20/2012  . HLD (hyperlipidemia) 07/15/2012  . BP (high blood pressure) 07/15/2012  . Cerebrovascular accident, late effects 01/30/2012  . Depression with anxiety 07/15/2011  .  Seborrhea capitis 04/18/2011  . Seborrheic dermatitis 03/07/2011  . Hemiplegia, late effect of cerebrovascular disease ([place]) 01/25/2011  . VAGINAL DISCHARGE 12/26/2010  . NEVUS 08/29/2010  . AMENORRHEA 07/19/2010  . SKIN RASH 05/21/2010  . THRUSH 12/28/2009  . Human immunodeficiency virus (HIV) disease ([place]) 08/06/2007  . Essential hypertension 08/06/2007     Health Maintenance Due  Topic Date Due  . INFLUENZA VACCINE  07/17/2019     Review of Systems Review of Systems   Constitutional: Negative for fever, chills, diaphoresis, activity change, appetite change, fatigue and unexpected weight change.  HENT: Negative for congestion, sore throat, rhinorrhea, sneezing, trouble swallowing and sinus pressure.  Eyes: Negative for photophobia and visual disturbance.  Respiratory: Negative for cough, chest tightness, shortness of breath, wheezing and stridor.  Cardiovascular: Negative for chest pain, palpitations and leg swelling.  Gastrointestinal: Negative for nausea, vomiting, abdominal pain, diarrhea, constipation, blood in stool, abdominal distention and anal bleeding.  Genitourinary: Negative for dysuria, hematuria, flank pain and difficulty urinating.  Musculoskeletal: Negative for myalgias, back pain, joint swelling, arthralgias and gait problem.  Skin: Negative for color change, pallor, rash and wound.  Neurological: Negative for dizziness, tremors, weakness and light-headedness.  Hematological: Negative for adenopathy. Does not bruise/bleed easily.  Psychiatric/Behavioral: poor sleep   Physical Exam   LMP 08/04/2014   SpO2 100%   Physical Exam  Constitutional:  oriented to person, place, and time. appears well-developed and well-nourished. No distress.  HENT: [place]/AT, PERRLA, no scleral icterus Mouth/Throat: Oropharynx is clear and moist. No oropharyngeal exudate.  Cardiovascular: Normal rate, regular rhythm and normal heart sounds. Exam reveals no gallop and no friction rub.  No murmur heard.  Pulmonary/Chest: Effort normal and breath sounds normal. No respiratory distress.  has no wheezes.  Neck = supple, no nuchal rigidity Lymphadenopathy: no cervical adenopathy. No axillary adenopathy Neurological: alert and oriented to person, place, and time.  Skin: Skin is warm and dry. No rash noted. No erythema.  Psychiatric: a normal mood and affect.  behavior is normal.   Lab Results  Component Value Date   CD4TCELL 40 04/13/2019   Lab Results  Component  Value Date   CD4TABS 448 04/13/2019   CD4TABS 400 01/06/2019   CD4TABS 520 09/07/2018   Lab Results  Component Value Date   HIV1RNAQUANT <20 DETECTED (A) 04/13/2019   Lab Results  Component Value Date   HEPBSAB NEG 03/10/2014   Lab Results  Component Value Date   LABRPR NON-REACTIVE 01/06/2019    CBC Lab Results  Component Value Date   WBC 3.9 (L) 03/27/2019   RBC 5.13 (H) 03/27/2019   HGB 15.8 (H) 03/27/2019   HCT 47.5 (H) 03/27/2019   PLT 177 03/27/2019   MCV 92.6 03/27/2019   MCH 30.8 03/27/2019   MCHC 33.3 03/27/2019   RDW 12.3 03/27/2019   LYMPHSABS 1.8 03/27/2019   MONOABS 0.4 03/27/2019   EOSABS 0.2 03/27/2019    BMET Lab Results  Component Value Date   NA 140 04/13/2019   K 3.4 (L) 04/13/2019   CL 105 04/13/2019   CO2 26 04/13/2019   GLUCOSE 93 04/13/2019   BUN 12 04/13/2019   CREATININE 0.84 04/13/2019   CALCIUM 8.7 04/13/2019   GFRNONAA 78 04/13/2019   GFRAA 91 04/13/2019      Assessment and Plan  HIV disease = well controlled. Continue on current regimen. Will check labs to ensure still undetectable  Long term medication management = will check cr   Health maintenance = will  see if needs pap/mammo  Insomnia = will refill trazadone

## 2019-08-05 LAB — T-HELPER CELL (CD4) - (RCID CLINIC ONLY)
CD4 % Helper T Cell: 39 % (ref 33–65)
CD4 T Cell Abs: 496 /uL (ref 400–1790)

## 2019-08-07 LAB — COMPLETE METABOLIC PANEL WITH GFR
AG Ratio: 1.4 (calc) (ref 1.0–2.5)
ALT: 17 U/L (ref 6–29)
AST: 14 U/L (ref 10–35)
Albumin: 3.9 g/dL (ref 3.6–5.1)
Alkaline phosphatase (APISO): 63 U/L (ref 37–153)
BUN: 14 mg/dL (ref 7–25)
CO2: 29 mmol/L (ref 20–32)
Calcium: 8.9 mg/dL (ref 8.6–10.4)
Chloride: 105 mmol/L (ref 98–110)
Creat: 0.83 mg/dL (ref 0.50–1.05)
GFR, Est African American: 92 mL/min/{1.73_m2} (ref >=60)
GFR, Est Non African American: 79 mL/min/{1.73_m2} (ref >=60)
Globulin: 2.7 g/dL (calc) (ref 1.9–3.7)
Glucose, Bld: 97 mg/dL (ref 65–99)
Potassium: 3.4 mmol/L — ABNORMAL LOW (ref 3.5–5.3)
Sodium: 141 mmol/L (ref 135–146)
Total Bilirubin: 0.8 mg/dL (ref 0.2–1.2)
Total Protein: 6.6 g/dL (ref 6.1–8.1)

## 2019-08-07 LAB — CBC WITH DIFFERENTIAL/PLATELET
Absolute Monocytes: 286 cells/uL (ref 200–950)
Basophils Absolute: 11 cells/uL (ref 0–200)
Basophils Relative: 0.4 %
Eosinophils Absolute: 109 cells/uL (ref 15–500)
Eosinophils Relative: 3.9 %
HCT: 41.7 % (ref 35.0–45.0)
Hemoglobin: 14.2 g/dL (ref 11.7–15.5)
Lymphs Abs: 1350 cells/uL (ref 850–3900)
MCH: 31.1 pg (ref 27.0–33.0)
MCHC: 34.1 g/dL (ref 32.0–36.0)
MCV: 91.4 fL (ref 80.0–100.0)
MPV: 11.2 fL (ref 7.5–12.5)
Monocytes Relative: 10.2 %
Neutro Abs: 1044 cells/uL — ABNORMAL LOW (ref 1500–7800)
Neutrophils Relative %: 37.3 %
Platelets: 157 10*3/uL (ref 140–400)
RBC: 4.56 10*6/uL (ref 3.80–5.10)
RDW: 13.7 % (ref 11.0–15.0)
Total Lymphocyte: 48.2 %
WBC: 2.8 10*3/uL — ABNORMAL LOW (ref 3.8–10.8)

## 2019-08-07 LAB — HIV-1 RNA QUANT-NO REFLEX-BLD
HIV 1 RNA Quant: <20 copies/mL
HIV-1 RNA Quant, Log: <1.3 Log copies/mL

## 2019-08-07 LAB — RPR: RPR Ser Ql: NONREACTIVE

## 2019-08-09 ENCOUNTER — Ambulatory Visit (INDEPENDENT_AMBULATORY_CARE_PROVIDER_SITE_OTHER): Payer: Medicare Other | Admitting: *Deleted

## 2019-08-09 DIAGNOSIS — I63132 Cerebral infarction due to embolism of left carotid artery: Secondary | ICD-10-CM | POA: Diagnosis not present

## 2019-08-10 LAB — CUP PACEART REMOTE DEVICE CHECK
Date Time Interrogation Session: 20200823174122
Implantable Pulse Generator Implant Date: 20200413

## 2019-08-12 ENCOUNTER — Other Ambulatory Visit: Payer: Self-pay | Admitting: Family

## 2019-08-12 DIAGNOSIS — B20 Human immunodeficiency virus [HIV] disease: Secondary | ICD-10-CM

## 2019-08-16 NOTE — Progress Notes (Signed)
Carelink Summary Report / Loop Recorder 

## 2019-08-18 ENCOUNTER — Encounter: Payer: Self-pay | Admitting: Family Medicine

## 2019-08-18 ENCOUNTER — Other Ambulatory Visit: Payer: Self-pay

## 2019-08-18 ENCOUNTER — Ambulatory Visit (INDEPENDENT_AMBULATORY_CARE_PROVIDER_SITE_OTHER): Payer: Medicare Other | Admitting: Family Medicine

## 2019-08-18 DIAGNOSIS — M79605 Pain in left leg: Secondary | ICD-10-CM

## 2019-08-18 NOTE — Patient Instructions (Signed)
Intermittent Claudication Intermittent claudication is pain in one or both legs that occurs when walking or exercising and goes away when resting. Intermittent claudication is a symptom of peripheral arterial disease (PAD). This condition is commonly treated with rest, medicine, and healthy lifestyle changes. If medical management does not improve symptoms, surgery can be done to restore blood flow (revascularization) to the affected leg. What are the causes?  This condition is caused by buildup of fatty material (plaque) within the major arteries in the body (atherosclerosis). Plaque makes arteries stiff and narrow, which prevents enough blood from reaching the leg muscles. Pain occurs when you walk or exercise because your muscles need (but cannot get) more blood when you are moving and exercising. What increases the risk? The following factors may make you more likely to develop this condition:  A family history of atherosclerosis.  A personal history of stroke or heart disease.  Older age.  Being inactive (sedentary lifestyle).  Being overweight.  Smoking cigarettes.  Having another health condition such as: ? Diabetes. ? High blood pressure. ? High cholesterol. What are the signs or symptoms? Symptoms of this condition may first develop in the lower leg, and then they may spread to the thigh, hip, buttock, or the back of the lower leg (calf) over time. Symptoms may include:  Aches or pains.  Cramps.  A feeling of tightness, weakness, or heaviness.  A wound on the lower leg or foot that heals poorly or does not heal. How is this diagnosed? This condition may be diagnosed based on:  Your symptoms.  Your medical history.  Tests, such as: ? Blood tests. ? Arterial duplex ultrasound. This test uses images of blood vessels and surrounding organs to evaluate blood flow within arteries. ? Angiogram. In this procedure, dye is injected into arteries and then X-rays are taken.  ? Magnetic resonance angiogram (MRA). In this procedure, strong magnets and radio waves are used instead of X-rays to create images of blood vessels and blood flow. ? CT angiogram (CTA). In this procedure, a large X-ray machine called a CT scanner takes detailed pictures of blood vessels that have been injected with dye. ? Ankle-brachial index (ABI) test. This procedure measures blood pressure in the leg during exercise and at rest. ? Exercise test. For this test, you will walk on a treadmill while tests are done (such as the ABI test) to evaluate how this condition affects your ability to walk or exercise. How is this treated? Treatment for this condition may involve treatment for the underlying cause, such as treatment for high blood pressure, high cholesterol, or diabetes. Treatment may include:  Lifestyle changes such as: ? Starting a supervised or home-based exercise program. ? Losing weight. ? Quitting smoking.  Medicines to help restore blood flow through your legs.  Blood vessel surgery (angioplasty) to restore blood flow around the blocked vessel. This is also known as endovascular therapy (EVT). This is only done if your intermittent claudication is caused by severe peripheral artery disease, a condition in which blood flow is severely or totally restricted by the narrowing of the arteries. Follow these instructions at home: Lifestyle   Maintain a healthy weight.  Eat a diet that is low in saturated fats and calories. Consider working with a diet and nutrition specialist (dietitian) to help you make healthy food choices.  Do not use any products that contain nicotine or tobacco, such as cigarettes and e-cigarettes. If you need help quitting, ask your health care provider.  If   your health care provider recommended an exercise program for you, follow it as directed. Your exercise program may involve: ? Walking 3 or more times a week. ? Walking until you have certain symptoms of  intermittent claudication. ? Resting until symptoms go away. ? Gradually increasing your walking time to about 50 minutes a day. General instructions  Work with your health care provider to manage any other health conditions you may have, including diabetes, high blood pressure, or high cholesterol.  Take over-the-counter and prescription medicines only as told by your health care provider.  Keep all follow-up visits as told by your health care provider. This is important. Contact a health care provider if:  Your pain does not go away with rest.  You have sores on your legs that do not heal or have a bad smell or pus coming from them.  Your condition gets worse or does not get better with treatment. Get help right away if:  You have chest pain.  You have difficulty breathing.  You develop arm weakness.  You have trouble speaking.  Your face begins to droop.  Your foot or leg is cold or it changes color.  Your foot or leg becomes numb. These symptoms may represent a serious problem that is an emergency. Do not wait to see if the symptoms will go away. Get medical help right away. Call your local emergency services (911 in the [place] Do not drive yourself to the hospital.  Summary  Intermittent claudication is pain in one or both legs that occurs when walking or exercising and goes away when resting.  This condition is caused by buildup of fatty material (plaque) within the major arteries in the body (atherosclerosis). Plaque makes arteries stiff and narrow, which prevents enough blood from reaching the leg muscles.  Intermittent claudication can be treated with medicine and lifestyle changes. If medical treatment fails, surgery can be done to help return blood flow to the affected area.  Make sure you work with your health care provider to manage any other health conditions you may have, including diabetes, high blood pressure, or high cholesterol. This information is not  intended to replace advice given to you by your health care provider. Make sure you discuss any questions you have with your health care provider. Document Released: 10/04/2004 Document Revised: 11/14/2017 Document Reviewed: 01/02/2017 Elsevier Patient Education  2020 [employer]  

## 2019-08-18 NOTE — Progress Notes (Signed)
  Patient [facility]   Progress Note: Sick Visit Provider: Lanae Boast, FNP  SUBJECTIVE:   Abigail Medina is a 53 y.o. female who  has a past medical history of HIV (human immunodeficiency virus infection) ([address]), Hypertension, Seizures ([place]), Stroke ([place]), and Vision abnormalities.. Patient presents today for Leg Pain  Patient states that she is experiencing intermittent leg pain throughout the day. She has a history of CVA and aneurysm of thoracic aorta, HLD and tobacco use. Patient not in pain today. States that when it occurs, it is achy and goes from the calf to the top of the left foot. She reports that the pain worsened after her last CVA.  Review of Systems  Constitutional: Negative.   HENT: Negative.   Eyes: Negative.   Respiratory: Negative.   Cardiovascular: Positive for claudication.  Gastrointestinal: Negative.   Genitourinary: Negative.   Musculoskeletal: Negative.   Skin: Negative.   Neurological: Negative.   Psychiatric/Behavioral: Negative.      OBJECTIVE: LMP 08/04/2014     BP 136/98 (BP Location: Right Arm, Patient Position: Sitting, Cuff Size: Small)  Pulse 105   Temp 98.8 F (37.1 C) (Oral)  Resp 20  Wt 164 lb 6.4 oz (74.6 kg)  LMP 08/04/2014  SpO2 100%  BMI 29.12 kg/m  BSA 1.82 m     Wt Readings from Last 3 Encounters:  08/18/19 164 lb 6.4 oz (74.6 kg)  07/26/19 163 lb (73.9 kg)  05/18/19 160 lb (72.6 kg)     Physical Exam Vitals signs and nursing note reviewed.  Constitutional:      General: She is not in acute distress.    Appearance: Normal appearance.  HENT:     Head: Normocephalic and atraumatic.  Eyes:     Extraocular Movements: Extraocular movements intact.     Conjunctiva/sclera: Conjunctivae normal.     Pupils: Pupils are equal, round, and reactive to light.  Cardiovascular:     Rate and Rhythm: Normal rate and regular rhythm.     Heart sounds: No murmur.  Pulmonary:     Effort:  Pulmonary effort is normal.     Breath sounds: Normal breath sounds.  Musculoskeletal: Normal range of motion.     Left lower leg: She exhibits tenderness. No edema.       Legs:  Skin:    General: Skin is warm and dry.  Neurological:     Mental Status: She is alert and oriented to person, place, and time.  Psychiatric:        Mood and Affect: Mood normal.        Behavior: Behavior normal.        Thought Content: Thought content normal.        Judgment: Judgment normal.     ASSESSMENT/PLAN:   1. Left leg pain Possible claudication due to medical history.  - Ambulatory referral to Vascular Surgery  Suggested wearing supportive shoes and stockings.        The patient was given clear instructions to go to ER or return to medical center if symptoms do not improve, worsen or new problems develop. The patient verbalized understanding and agreed with plan of care.   [name]. [name], FNP-BC [facility] [address] [place], [place] [address] [phone]     This note has been created with Dragon speech recognition software and smart phrase technology. Any transcriptional errors are unintentional.

## 2019-08-22 ENCOUNTER — Other Ambulatory Visit: Payer: Self-pay | Admitting: Family Medicine

## 2019-08-25 ENCOUNTER — Encounter (HOSPITAL_COMMUNITY): Payer: Self-pay

## 2019-08-25 ENCOUNTER — Encounter (HOSPITAL_COMMUNITY): Payer: Self-pay | Admitting: *Deleted

## 2019-09-06 ENCOUNTER — Encounter: Payer: Self-pay | Admitting: Adult Health

## 2019-09-06 ENCOUNTER — Ambulatory Visit: Payer: Medicare Other | Admitting: Adult Health

## 2019-09-06 ENCOUNTER — Other Ambulatory Visit: Payer: Self-pay

## 2019-09-06 ENCOUNTER — Encounter: Payer: Self-pay | Admitting: Surgery

## 2019-09-06 ENCOUNTER — Ambulatory Visit (INDEPENDENT_AMBULATORY_CARE_PROVIDER_SITE_OTHER): Payer: Medicare Other | Admitting: Surgery

## 2019-09-06 ENCOUNTER — Ambulatory Visit (HOSPITAL_COMMUNITY)
Admission: RE | Admit: 2019-09-06 | Discharge: 2019-09-06 | Disposition: A | Discharge status: Home / Self Care | Payer: Medicare Other | Source: Ambulatory Visit | Attending: Family | Admitting: Family

## 2019-09-06 ENCOUNTER — Telehealth: Payer: Self-pay

## 2019-09-06 VITALS — BP 116/86 | HR 81 | Temp 97.9°F | Resp 20 | Ht 63.0 in | Wt 165.0 lb

## 2019-09-06 DIAGNOSIS — M79605 Pain in left leg: Secondary | ICD-10-CM

## 2019-09-06 DIAGNOSIS — M79662 Pain in left lower leg: Secondary | ICD-10-CM | POA: Diagnosis not present

## 2019-09-06 NOTE — Telephone Encounter (Signed)
Patient was a no call/no show for their appointment today.   

## 2019-09-06 NOTE — Progress Notes (Signed)
[facility]  Patient name: Abigail Medina MRN: [id number] DOB: 07/28/1966 Sex: female   REQUESTING PROVIDER:    Peyton Bottoms   REASON FOR CONSULT:    Leg pain  HISTORY OF PRESENT ILLNESS:   Abigail Medina is a 53 y.o. female, who is referred today for evaluation of leg pain.  She states that it began approximately 3 months ago.  It really only affects her left leg.  She says that it occurs mainly with walking however she has had an episode where it bothered her sitting down.  She describes the symptoms as her leg feeling achy and heavy.  It does go away with rest and usually lasts about 5 minutes.  She does not describe rest pain or nonhealing wounds.  The patient does report a history of low back pain.  She is medically managed for hypertension.  She does have a history of stroke secondary to hypertension in the past.  She is HIV positive on medication.  She is a current smoker.  PAST MEDICAL HISTORY    Past Medical History:  Diagnosis Date  . HIV (human immunodeficiency virus infection) ([place])   . Hypertension   . Seizures ([place])   . Stroke ([place])   . Vision abnormalities      FAMILY HISTORY   Family History  Problem Relation Age of Onset  . Hypertension Mother     SOCIAL HISTORY:   Social History   Socioeconomic History  . Marital status: Single    Spouse name: Not on file  . Number of children: Not on file  . Years of education: Not on file  . Highest education level: Not on file  Occupational History  . Not on file  Social Needs  . Financial resource strain: Not on file  . Food insecurity    Worry: Not on file    Inability: Not on file  . Transportation needs    Medical: Not on file    Non-medical: Not on file  Tobacco Use  . Smoking status: Current Some Day Smoker    Packs/day: 0.25    Types: Cigarettes    Start date: 10/17/2015  . Smokeless tobacco: Never Used  Substance  and Sexual Activity  . Alcohol use: Yes    Alcohol/week: 2.0 - 3.0 standard drinks    Types: 2 - 3 Standard drinks or equivalent per week    Comment: liquor or beer; stated has increased with social meetings  . Drug use: No  . Sexual activity: Yes    Partners: Male    Birth control/protection: Condom    Comment: accepted condoms  Lifestyle  . Physical activity    Days per week: Not on file    Minutes per session: Not on file  . Stress: Not on file  Relationships  . Social Herbalist on phone: Not on file    Gets together: Not on file    Attends religious service: Not on file    Active member of club or organization: Not on file    Attends meetings of clubs or organizations: Not on file    Relationship status: Not on file  . Intimate partner violence    Fear of current or ex partner: Not on file    Emotionally abused: Not on file    Physically abused: Not on file    Forced sexual activity: Not on file  Other Topics Concern  . Not on file  Social History Narrative  . Not on file    ALLERGIES:    Allergies  Allergen Reactions  . Lisinopril Swelling    CURRENT MEDICATIONS:    Current Outpatient Medications  Medication Sig Dispense Refill  . albuterol (PROVENTIL HFA;VENTOLIN HFA) 108 (90 Base) MCG/ACT inhaler Inhale 2 puffs into the lungs every 4 (four) hours as needed for wheezing or shortness of breath (cough, shortness of breath or wheezing.). 1 Inhaler 6  . atorvastatin (LIPITOR) 40 MG tablet Take 1 tablet (40 mg total) by mouth daily at 6 PM. 90 tablet 1  . cetirizine (ZYRTEC) 10 MG tablet Take 1 tablet (10 mg total) by mouth daily. 30 tablet 11  . cimetidine (TAGAMET) 200 MG tablet Take 1 tablet (200 mg total) by mouth 2 (two) times daily. (Patient taking differently: Take 200 mg by mouth daily as needed (heartburn). ) 30 tablet 11  . clopidogrel (PLAVIX) 75 MG tablet TAKE 1 TABLET BY MOUTH EVERY DAY 30 tablet 1  . [facility] B-12 500 MCG tablet TAKE 1 TABLET  BY MOUTH EVERY DAY 30 tablet 1  . DESCOVY 200-25 MG tablet TAKE 1 TABLET BY MOUTH DAILY 30 tablet 3  . fluticasone (FLONASE) 50 MCG/ACT nasal spray SPRAY 2 SPRAYS INTO EACH NOSTRIL EVERY DAY 48 mL 2  . hydrochlorothiazide (HYDRODIURIL) 25 MG tablet Take 1 tablet (25 mg total) by mouth daily. 90 tablet 11  . metoprolol succinate (TOPROL-XL) 50 MG 24 hr tablet     . PARoxetine (PAXIL) 30 MG tablet TAKE 1 TABLET BY MOUTH EVERY DAY (Patient taking differently: Take 30 mg by mouth daily. ) 90 tablet 4  . TIVICAY 50 MG tablet TAKE 1 TABLET(50 MG) BY MOUTH DAILY 30 tablet 3  . traZODone (DESYREL) 50 MG tablet Take 0.5 tablets (25 mg total) by mouth at bedtime as needed for sleep. 30 tablet 3  . Vitamin D, Ergocalciferol, (DRISDOL) 1.25 MG (50000 UT) CAPS capsule TAKE 1 CAPSULE BY MOUTH ONCE A WEEK 8 capsule 1  . gabapentin (NEURONTIN) 400 MG capsule Take 1 capsule (400 mg total) by mouth 3 (three) times daily. Start taking 1 tab daily x 7d, then twice a day x 7d, then can do TID (Patient not taking: Reported on 08/04/2019) 90 capsule 5   No current facility-administered medications for this visit.     REVIEW OF SYSTEMS:   [X]  denotes positive finding, [ ]  denotes negative finding Cardiac  Comments:  Chest pain or chest pressure:    Shortness of breath upon exertion:    Short of breath when lying flat:    Irregular heart rhythm:        Vascular    Pain in calf, thigh, or hip brought on by ambulation: x   Pain in feet at night that wakes you up from your sleep:     Blood clot in your veins:    Leg swelling:         Pulmonary    Oxygen at home:    Productive cough:     Wheezing:         Neurologic    Sudden weakness in arms or legs:  x   Sudden numbness in arms or legs:  x   Sudden onset of difficulty speaking or slurred speech:    Temporary loss of vision in one eye:     Problems with dizziness:         Gastrointestinal    Blood in stool:  Vomited blood:         Genitourinary     Burning when urinating:     Blood in urine:        Psychiatric    Major depression:         Hematologic    Bleeding problems:    Problems with blood clotting too easily:        Skin    Rashes or ulcers:        Constitutional    Fever or chills:     PHYSICAL EXAM:   Vitals:   09/06/19 1533  BP: 116/86  Pulse: 81  Resp: 20  Temp: 97.9 F (36.6 C)  SpO2: 96%  Weight: 74.8 kg  Height: 5\' 3"  (1.6 m)    GENERAL: The patient is a well-nourished female, in no acute distress. The vital signs are documented above. CARDIAC: There is a regular rate and rhythm.  VASCULAR: Palpable dorsalis pedis pulse bilaterally PULMONARY: Nonlabored respirations.  MUSCULOSKELETAL: There are no major deformities or cyanosis. NEUROLOGIC: No focal weakness or paresthesias are detected. SKIN: There are no ulcers or rashes noted. PSYCHIATRIC: The patient has a normal affect.  STUDIES:   I have reviewed the following studies: +-------+-----------+-----------+------------+------------+ ABI/TBIToday's ABIToday's TBIPrevious ABIPrevious TBI +-------+-----------+-----------+------------+------------+ Right  1.02       0.80                                +-------+-----------+-----------+------------+------------+ Left   1.13       0.89                                +-------+-----------+-----------+------------+------------+     Right toe pressure=91 Left toe pressure=102 ASSESSMENT and PLAN   Leg pain: The patient has palpable dorsalis pedis pulses bilaterally and a normal ABI.  I do not think that her symptoms are related to arterial insufficiency despite the clinical presentation.  I suspect this is either related to her lower back issues or possibly medication related.  This appears to be more neuropathic in origin.  This was discussed with the patient.  She will follow-up with me on an as-needed basis.   Leia Alf, MD, FACS [facility] [phone] Pager [phone]

## 2019-09-06 NOTE — Progress Notes (Deleted)
[facility] [address] [place]. [place] [address] [phone]       OFFICE FOLLOW UP NOTE  Abigail Medina Date of Birth:  1966/06/16 Medical Record Number:  [id number]   Reason for visit: Left MCA stroke 03/2019    CHIEF COMPLAINT:  No chief complaint on file.   HPI:  Stroke admission 03/2019: [name] is a 53 y.o. 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 She did not receive IV t-PA due to mild deficits.  Stroke work-up revealed posterior left frontal cortical infarcts with left MCA distribution embolic pattern secondary to unknown source as evidenced on MRI.  Vessel imaging mild arthrosclerosis but otherwise unremarkable.  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  TEE performed in 01/2011 which was negative for PFO therefore loop recorder placed to rule out atrial fibrillation.  Recommended DAPT for 3 weeks and Plavix alone.  LDL 144 and A1c 5.2.  Hypercoagulable work-up negative.  Initiated atorvastatin 40 mg daily.  HTN stable.  Other stroke risk factors include HIV, prior history of stroke, former tobacco use and EtOH use.  Discharged home in stable condition recommendation of home health therapies.  Virtual visit 05/27/2019: She has been stable from a stroke standpoint. Complaints of intermittent dizziness sensation worse in the morning and after eating. Pause episodes have been noted on loop recorder associated with lightheaded feeling.  Denies any other neurological symptom.  Episodes subside rapidly.  Decreased metoprolol dose from 100mg  daily to 50mg  daily. She denies improvement of these episodes with dose reduction. Blood pressure monitored at home with todays reading 129/92 without taking AM medications. At times can be lower especially after  medication administration.  Unsure if dizziness/lightheadedness episodes are related to hypotension.  She has continued on aspirin and Plavix despite recommendation of 3-week duration but denies bleeding or bruising.  Continues on atorvastatin without side effects myalgias. Loop recorder has not shown atrial fibrillation thus far.  No further concerns at this time.  Denies new or worsening stroke/TIA symptoms.  Update 09/06/2019: Ms. [name] is being seen today for 35-month follow-up regarding recent stroke.  She has been doing well from a neurological standpoint.  Loop recorder has not shown atrial fibrillation thus far.  Continues on clopidogrel 75 mg daily and atorvastatin for secondary stroke prevention without side effects.  Blood pressure ***.  Denies new or worsening stroke/TIA symptoms.     ROS:   14 system review of systems performed and negative with exception of dizziness  PMH:  Past Medical History:  Diagnosis Date   HIV (human immunodeficiency virus infection) ([place])    Hypertension    Seizures ([place])    Stroke ([place])    Vision abnormalities     PSH:  Past Surgical History:  Procedure Laterality Date   LOOP RECORDER INSERTION N/A 03/29/2019   Procedure: LOOP RECORDER INSERTION;  Surgeon: Constance Haw, MD;  Location: [place] [facility];  Service: Cardiovascular;  Laterality: N/A;    Social History:  Social History   Socioeconomic History   Marital status: Single    Spouse name: Not on file   Number of children: Not on file   Years of education: Not on file   Highest education level: Not on file  Occupational History   Not on file  Social Needs  Financial resource strain: Not on file   Food insecurity    Worry: Not on file    Inability: Not on file   Transportation needs    Medical: Not on file    Non-medical: Not on file  Tobacco Use   Smoking status: Former Smoker    Packs/day: 0.25    Types: Cigarettes    Start date: 10/17/2015    Smokeless tobacco: Never Used  Substance and Sexual Activity   Alcohol use: Yes    Alcohol/week: 2.0 - 3.0 standard drinks    Types: 2 - 3 Standard drinks or equivalent per week    Comment: liquor or beer; stated has increased with social meetings   Drug use: No   Sexual activity: Yes    Partners: Male    Birth control/protection: Condom    Comment: accepted condoms  Lifestyle   Physical activity    Days per week: Not on file    Minutes per session: Not on file   Stress: Not on file  Relationships   Social connections    Talks on phone: Not on file    Gets together: Not on file    Attends religious service: Not on file    Active member of club or organization: Not on file    Attends meetings of clubs or organizations: Not on file    Relationship status: Not on file   Intimate partner violence    Fear of current or ex partner: Not on file    Emotionally abused: Not on file    Physically abused: Not on file    Forced sexual activity: Not on file  Other Topics Concern   Not on file  Social History Narrative   Not on file    Family History:  Family History  Problem Relation Age of Onset   Hypertension Mother     Medications:   Current Outpatient Medications on File Prior to Visit  Medication Sig Dispense Refill   albuterol (PROVENTIL HFA;VENTOLIN HFA) 108 (90 Base) MCG/ACT inhaler Inhale 2 puffs into the lungs every 4 (four) hours as needed for wheezing or shortness of breath (cough, shortness of breath or wheezing.). 1 Inhaler 6   atorvastatin (LIPITOR) 40 MG tablet Take 1 tablet (40 mg total) by mouth daily at 6 PM. 90 tablet 1   cetirizine (ZYRTEC) 10 MG tablet Take 1 tablet (10 mg total) by mouth daily. 30 tablet 11   cimetidine (TAGAMET) 200 MG tablet Take 1 tablet (200 mg total) by mouth 2 (two) times daily. (Patient taking differently: Take 200 mg by mouth daily as needed (heartburn). ) 30 tablet 11   clopidogrel (PLAVIX) 75 MG tablet TAKE 1 TABLET BY  MOUTH EVERY DAY 30 tablet 1   [employer] B-12 500 MCG tablet TAKE 1 TABLET BY MOUTH EVERY DAY 30 tablet 1   DESCOVY 200-25 MG tablet TAKE 1 TABLET BY MOUTH DAILY 30 tablet 3   fluticasone (FLONASE) 50 MCG/ACT nasal spray SPRAY 2 SPRAYS INTO EACH NOSTRIL EVERY DAY 48 mL 2   gabapentin (NEURONTIN) 400 MG capsule Take 1 capsule (400 mg total) by mouth 3 (three) times daily. Start taking 1 tab daily x 7d, then twice a day x 7d, then can do TID (Patient not taking: Reported on 08/04/2019) 90 capsule 5   hydrochlorothiazide (HYDRODIURIL) 25 MG tablet Take 1 tablet (25 mg total) by mouth daily. 90 tablet 11   metoprolol succinate (TOPROL-XL) 50 MG 24 hr tablet  PARoxetine (PAXIL) 30 MG tablet TAKE 1 TABLET BY MOUTH EVERY DAY (Patient taking differently: Take 30 mg by mouth daily. ) 90 tablet 4   TIVICAY 50 MG tablet TAKE 1 TABLET(50 MG) BY MOUTH DAILY 30 tablet 3   traZODone (DESYREL) 50 MG tablet Take 0.5 tablets (25 mg total) by mouth at bedtime as needed for sleep. 30 tablet 3   Vitamin D, Ergocalciferol, (DRISDOL) 1.25 MG (50000 UT) CAPS capsule TAKE 1 CAPSULE BY MOUTH ONCE A WEEK 8 capsule 1   No current facility-administered medications on file prior to visit.     Allergies:   Allergies  Allergen Reactions   Lisinopril Swelling     Physical Exam  There were no vitals filed for this visit. There is no height or weight on file to calculate BMI. No exam data present  Depression screen [facility] 2/9 08/04/2019  Decreased Interest 0  Down, Depressed, Hopeless 1  PHQ - 2 Score 1  Altered sleeping -  Tired, decreased energy -  Change in appetite -  Feeling bad or failure about yourself  -  Trouble concentrating -  Moving slowly or fidgety/restless -  Suicidal thoughts -  PHQ-9 Score -  Difficult doing work/chores -  Some recent data might be hidden     General: well developed, well nourished, pleasant middle-aged African-American female, seated, in no evident distress Head: head  normocephalic and atraumatic.    Neurologic Exam Mental Status: Awake and fully alert. Oriented to place and time. Recent and remote memory intact. Attention span, concentration and fund of knowledge appropriate. Mood and affect appropriate.  Cranial Nerves: Extraocular movements full without nystagmus. Hearing intact to voice. Facial sensation intact. Face, tongue, palate moves normally and symmetrically.  Shoulder shrug symmetric. Motor: No evidence of weakness per drift assessment Sensory.: intact to light touch Coordination: Rapid alternating movements normal in all extremities. Finger-to-nose and heel-to-shin performed accurately bilaterally. Gait and Station: Arises from chair without difficulty. Stance is normal. Gait demonstrates normal stride length and balance .  Reflexes: UTA   NIHSS  0 Modified Rankin  0      ASSESSMENT: Abigail Medina is a 53 y.o. year old female here with posterior left frontal cortical infarct within left MCA distribution embolic pattern on 123XX123 secondary to unknown source therefore loop recorder placed. Vascular risk factors include seizures, HIV, HTN, HLD, prior stroke.  Loop recorder has not shown atrial fibrillation thus far.  Stable from stroke standpoint without residual deficits.  Complains of intermittent dizziness sensation associated with pauses found on loop recorder.    PLAN:  1. Embolic infarct: Continue clopidogrel 75 mg daily  and atorvastatin for secondary stroke prevention.  Advised to discontinue aspirin at this time and continue on Plavix alone as no indication for prolonged DAPT.  Maintain strict control of hypertension with blood pressure goal below 130/90, diabetes with hemoglobin A1c goal below 6.5% and cholesterol with LDL cholesterol (bad cholesterol) goal below 70 mg/dL.  I also advised the patient to eat a healthy diet with plenty of whole grains, cereals, fruits and vegetables, exercise regularly with at least 30 minutes of  continuous activity daily and maintain ideal body weight.  Continue to monitor loop recorder for atrial fibrillation. 2. Dizziness: Likely unrelated to stroke and likely more cardiac related.  Advised her to continue to follow with cardiology in regards to potentially lowering antihypertensive dose with satisfactory blood pressure prior to medication administration 3. HTN: Advised to continue current treatment regimen.  Today's BP  129/92.  Advised to continue to monitor at home along with continued follow-up with PCP for management.  4. HLD: Advised to continue current treatment regimen along with continued follow-up with PCP for future prescribing and monitoring of lipid panel 5. Patient interested in [place] trial and would like additional information.  Research team notified.   Follow up in 3 months or call earlier if needed   Greater than 50% of time during this 30 minute non-face-to-face visit was spent on counseling, explanation of diagnosis of embolic infarct, reviewing risk factor management of HTN and HLD, discussion regarding likelihood of dizziness episodes being cardiac related, discussion regarding loop recorder, planning of further management along with potential future management, and discussion with patient and family answering all questions.    Frann Rider, AGNP-BC  [facility] [address] [place], [place] [address]  Phone [phone] Fax [phone] Note: This document was prepared with digital dictation and possible smart phrase technology. Any transcriptional errors that result from this process are unintentional.

## 2019-09-07 ENCOUNTER — Ambulatory Visit (INDEPENDENT_AMBULATORY_CARE_PROVIDER_SITE_OTHER): Payer: Medicare Other | Admitting: Adult Health

## 2019-09-07 ENCOUNTER — Ambulatory Visit: Payer: Medicare Other | Admitting: Gastroenterology

## 2019-09-07 ENCOUNTER — Encounter: Payer: Self-pay | Admitting: Adult Health

## 2019-09-07 VITALS — BP 148/106 | HR 72 | Temp 98.1°F | Ht 63.0 in | Wt 167.6 lb

## 2019-09-07 DIAGNOSIS — I639 Cerebral infarction, unspecified: Secondary | ICD-10-CM

## 2019-09-07 DIAGNOSIS — Z95818 Presence of other cardiac implants and grafts: Secondary | ICD-10-CM | POA: Diagnosis not present

## 2019-09-07 DIAGNOSIS — I10 Essential (primary) hypertension: Secondary | ICD-10-CM

## 2019-09-07 DIAGNOSIS — E785 Hyperlipidemia, unspecified: Secondary | ICD-10-CM

## 2019-09-07 NOTE — Patient Instructions (Signed)
Continue clopidogrel 75 mg daily  and lipitor  for secondary stroke prevention  Continue to follow up with PCP regarding cholesterol and blood pressure management   Continue to monitor blood pressure at home  Maintain strict control of hypertension with blood pressure goal below 130/90, diabetes with hemoglobin A1c goal below 6.5% and cholesterol with LDL cholesterol (bad cholesterol) goal below 70 mg/dL. I also advised the patient to eat a healthy diet with plenty of whole grains, cereals, fruits and vegetables, exercise regularly and maintain ideal body weight.        Thank you for coming to see [place] at [facility]. I hope we have been able to provide you high quality care today.  You may receive a patient satisfaction survey over the next few weeks. We would appreciate your feedback and comments so that we may continue to improve ourselves and the health of our patients.

## 2019-09-07 NOTE — Progress Notes (Signed)
[facility] [address] [place]. [place] [address] [phone]       OFFICE FOLLOW UP NOTE  Abigail Medina Date of Birth:  02/02/1966 Medical Record Number:  [id number]   Reason for visit: Left MCA stroke 03/2019    CHIEF COMPLAINT:  Chief Complaint  Patient presents with  . Follow-up    Rm Txt Rm   . Cerebrovascular Accident    stopped gabapentin made too groggy    HPI:  Stroke admission 03/2019: Abigail Medina is a 53 y.o. 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 She did not receive IV t-PA due to mild deficits.  Stroke work-up revealed posterior left frontal cortical infarcts with left MCA distribution embolic pattern secondary to unknown source as evidenced on MRI.  Vessel imaging mild arthrosclerosis but otherwise unremarkable.  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  TEE performed in 01/2011 which was negative for PFO therefore loop recorder placed to rule out atrial fibrillation.  Recommended DAPT for 3 weeks and Plavix alone.  LDL 144 and A1c 5.2.  Hypercoagulable work-up negative.  Initiated atorvastatin 40 mg daily.  HTN stable.  Other stroke risk factors include HIV, prior history of stroke, former tobacco use and EtOH use.  Discharged home in stable condition recommendation of home health therapies.  Virtual visit 05/27/2019: She has been stable from a stroke standpoint. Complaints of intermittent dizziness sensation worse in the morning and after eating. Pause episodes have been noted on loop recorder associated with lightheaded feeling.  Denies any other neurological symptom.  Episodes subside rapidly.  Decreased metoprolol dose from 100mg  daily to 50mg  daily. She denies improvement of these episodes with dose reduction. Blood pressure  monitored at home with todays reading 129/92 without taking AM medications. At times can be lower especially after medication administration.  Unsure if dizziness/lightheadedness episodes are related to hypotension.  She has continued on aspirin and Plavix despite recommendation of 3-week duration but denies bleeding or bruising.  Continues on atorvastatin without side effects myalgias. Loop recorder has not shown atrial fibrillation thus far.  No further concerns at this time.  Denies new or worsening stroke/TIA symptoms.  Update 09/07/2019: [name] is being seen today for 21-month follow-up regarding recent stroke.  She has been doing well from a neurological standpoint.  Loop recorder has not shown atrial fibrillation thus far.  Continues on clopidogrel 75 mg daily and atorvastatin for secondary stroke prevention without side effects.  Blood pressure elevated today at 148/106 but patient does monitor at home which has been normal.  Due to doctors appointments today, she has not been able to take antihypertensives.  She has been experiencing left leg pain with claudication type symptoms as well as numbness and tingling and was seen by vascular surgery yesterday with studies unremarkable.  She does continue to experience pain but also endorses back pain.  Denies new or worsening stroke/TIA symptoms.      ROS:   14 system review of systems performed and negative with exception of left leg pain  PMH:  Past Medical History:  Diagnosis Date  . HIV (human immunodeficiency virus infection) ([place])   . Hypertension   . Seizures ([place])   . Stroke ([place])   . Vision abnormalities     PSH:  Past Surgical History:  Procedure Laterality Date  . LOOP RECORDER INSERTION N/A 03/29/2019   Procedure: LOOP RECORDER INSERTION;  Surgeon: Constance Haw, MD;  Location: [place] [facility];  Service: Cardiovascular;  Laterality: N/A;    Social History:  Social History   Socioeconomic History  . Marital  status: Single    Spouse name: Not on file  . Number of children: Not on file  . Years of education: Not on file  . Highest education level: Not on file  Occupational History  . Not on file  Social Needs  . Financial resource strain: Not on file  . Food insecurity    Worry: Not on file    Inability: Not on file  . Transportation needs    Medical: Not on file    Non-medical: Not on file  Tobacco Use  . Smoking status: Current Some Day Smoker    Packs/day: 0.25    Types: Cigarettes    Start date: 10/17/2015  . Smokeless tobacco: Never Used  Substance and Sexual Activity  . Alcohol use: Yes    Alcohol/week: 2.0 - 3.0 standard drinks    Types: 2 - 3 Standard drinks or equivalent per week    Comment: liquor or beer; stated has increased with social meetings  . Drug use: No  . Sexual activity: Yes    Partners: Male    Birth control/protection: Condom    Comment: accepted condoms  Lifestyle  . Physical activity    Days per week: Not on file    Minutes per session: Not on file  . Stress: Not on file  Relationships  . Social Herbalist on phone: Not on file    Gets together: Not on file    Attends religious service: Not on file    Active member of club or organization: Not on file    Attends meetings of clubs or organizations: Not on file    Relationship status: Not on file  . Intimate partner violence    Fear of current or ex partner: Not on file    Emotionally abused: Not on file    Physically abused: Not on file    Forced sexual activity: Not on file  Other Topics Concern  . Not on file  Social History Narrative  . Not on file    Family History:  Family History  Problem Relation Age of Onset  . Hypertension Mother     Medications:   Current Outpatient Medications on File Prior to Visit  Medication Sig Dispense Refill  . albuterol (PROVENTIL HFA;VENTOLIN HFA) 108 (90 Base) MCG/ACT inhaler Inhale 2 puffs into the lungs every 4 (four) hours as needed  for wheezing or shortness of breath (cough, shortness of breath or wheezing.). 1 Inhaler 6  . atorvastatin (LIPITOR) 40 MG tablet Take 1 tablet (40 mg total) by mouth daily at 6 PM. 90 tablet 1  . cetirizine (ZYRTEC) 10 MG tablet Take 1 tablet (10 mg total) by mouth daily. 30 tablet 11  . cimetidine (TAGAMET) 200 MG tablet Take 1 tablet (200 mg total) by mouth 2 (two) times daily. (Patient taking differently: Take 200 mg by mouth daily as needed (heartburn). ) 30 tablet 11  . clopidogrel (PLAVIX) 75 MG tablet TAKE 1 TABLET BY MOUTH EVERY DAY 30 tablet 1  . CVS B-12 500 MCG tablet TAKE 1 TABLET BY MOUTH EVERY DAY 30 tablet 1  . DESCOVY 200-25 MG tablet TAKE 1 TABLET BY MOUTH DAILY 30 tablet 3  .  fluticasone (FLONASE) 50 MCG/ACT nasal spray SPRAY 2 SPRAYS INTO EACH NOSTRIL EVERY DAY 48 mL 2  . hydrochlorothiazide (HYDRODIURIL) 25 MG tablet Take 1 tablet (25 mg total) by mouth daily. 90 tablet 11  . metoprolol succinate (TOPROL-XL) 50 MG 24 hr tablet     . PARoxetine (PAXIL) 30 MG tablet TAKE 1 TABLET BY MOUTH EVERY DAY (Patient taking differently: Take 30 mg by mouth daily. ) 90 tablet 4  . TIVICAY 50 MG tablet TAKE 1 TABLET(50 MG) BY MOUTH DAILY 30 tablet 3  . traZODone (DESYREL) 50 MG tablet Take 0.5 tablets (25 mg total) by mouth at bedtime as needed for sleep. 30 tablet 3  . Vitamin D, Ergocalciferol, (DRISDOL) 1.25 MG (50000 UT) CAPS capsule TAKE 1 CAPSULE BY MOUTH ONCE A WEEK 8 capsule 1  . gabapentin (NEURONTIN) 400 MG capsule Take 1 capsule (400 mg total) by mouth 3 (three) times daily. Start taking 1 tab daily x 7d, then twice a day x 7d, then can do TID (Patient not taking: Reported on 09/07/2019) 90 capsule 5   No current facility-administered medications on file prior to visit.     Allergies:   Allergies  Allergen Reactions  . Lisinopril Swelling     Physical Exam  Vitals:   09/07/19 1446  BP: (!) 148/106  Pulse: 72  Temp: 98.1 F (36.7 C)  Weight: 167 lb 9.6 oz (76 kg)   Height: 5\' 3"  (1.6 m)   Body mass index is 29.69 kg/m. No exam data present  General: well developed, well nourished, pleasant middle aged 75 female, seated, in no evident distress Head: head normocephalic and atraumatic.   Neck: supple with no carotid or supraclavicular bruits Cardiovascular: regular rate and rhythm, no murmurs Musculoskeletal: no deformity Skin:  no rash/petichiae Vascular:  Normal pulses all extremities   Neurologic Exam Mental Status: Awake and fully alert. Oriented to place and time. Recent and remote memory intact. Attention span, concentration and fund of knowledge appropriate. Mood and affect appropriate.  Cranial Nerves: Fundoscopic exam reveals sharp disc margins. Pupils equal, briskly reactive to light. Extraocular movements full without nystagmus. Visual fields full to confrontation. Hearing intact. Facial sensation intact. Face, tongue, palate moves normally and symmetrically.  Motor: Normal bulk and tone. Normal strength in all tested extremity muscles. Sensory.: intact to touch , pinprick , position and vibratory sensation.  Coordination: Rapid alternating movements normal in all extremities. Finger-to-nose and heel-to-shin performed accurately bilaterally. Gait and Station: Arises from chair without difficulty. Stance is normal. Gait demonstrates normal stride length and balance Reflexes: 1+ and symmetric. Toes downgoing.        ASSESSMENT: Abigail Medina is a 53 y.o. year old female here with posterior left frontal cortical infarct within left MCA distribution embolic pattern on 123XX123 secondary to unknown source therefore loop recorder placed. Vascular risk factors include seizures, HIV, HTN, HLD, prior stroke.  Loop recorder has not shown atrial fibrillation thus far.  Recovered well from a stroke standpoint without residual deficits or reoccurring symptoms.  Has been experiencing left leg pain with vascular surgery work-up unremarkable.   Symptoms likely secondary to lumbar region abnormalities    PLAN:  1. Embolic infarct: Continue clopidogrel 75 mg daily  and atorvastatin for secondary stroke prevention.  Advised to discontinue aspirin at this time and continue on Plavix alone as no indication for prolonged DAPT.  Maintain strict control of hypertension with blood pressure goal below 130/90, diabetes with hemoglobin A1c goal below 6.5% and cholesterol  with LDL cholesterol (bad cholesterol) goal below 70 mg/dL.  I also advised the patient to eat a healthy diet with plenty of whole grains, cereals, fruits and vegetables, exercise regularly with at least 30 minutes of continuous activity daily and maintain ideal body weight.  Continue to monitor loop recorder for atrial fibrillation. 2. HTN: Advised to continue current treatment regimen.  Advised to continue to monitor at home along with continued follow-up with PCP for management.  3. HLD: Advised to continue current treatment regimen along with continued follow-up with PCP for future prescribing and monitoring of lipid panel 4. Left leg pain: Likely secondary to lumbar etiology and advised to follow-up with PCP for further evaluation and potential referral to neurosurgery or orthopedics   Overall stable from stroke standpoint recommend follow-up as needed   Greater than 50% of time during this 25 minute visit was spent on counseling, explanation of diagnosis of embolic infarct, reviewing risk factor management of HTN and HLD,  discussion regarding loop recorder, planning of further management along with potential future management, and discussion with patient and answering all questions.    Frann Rider, AGNP-BC  [facility] [address] [place], [place] [address]  Phone [phone] Fax [phone] Note: This document was prepared with digital dictation and possible smart phrase technology. Any transcriptional errors that result from  this process are unintentional.

## 2019-09-08 NOTE — Progress Notes (Signed)
I agree with the above plan 

## 2019-09-10 ENCOUNTER — Encounter: Payer: Medicare Other | Admitting: *Deleted

## 2019-09-13 ENCOUNTER — Other Ambulatory Visit: Payer: Self-pay | Admitting: Family Medicine

## 2019-09-13 DIAGNOSIS — E559 Vitamin D deficiency, unspecified: Secondary | ICD-10-CM

## 2019-09-21 ENCOUNTER — Telehealth: Payer: Self-pay

## 2019-09-21 ENCOUNTER — Ambulatory Visit: Payer: Medicare Other | Admitting: Gastroenterology

## 2019-09-21 NOTE — Telephone Encounter (Signed)
I left a message with a female for the pt to call my direct office number. I was calling her because I need a manual transmission with her home monitor.

## 2019-09-21 NOTE — Telephone Encounter (Signed)
Pt sent a manual transmission with her home monitor. Transmission received. I told her the nurse will review the transmission and give her a call back.

## 2019-09-21 NOTE — Telephone Encounter (Signed)
LINQ manual transmission reveals 2 pause episodes from 08/17/19 (3sec duration at 20:34) and 08/22/19 (4sec duration at 20:28), previously unreviewed due to disconnected monitor.   Spoke with patient. She does not recall any symptoms from these dates, denies any recent syncope, though she does have occasional lightheadedness. Most recent episode of lightheadedness occurred yesterday, 09/20/19. Explained no episodes to correlate with lightheadedness yesterday. Encouraged pt to call our office for any presyncopal/syncopal episodes going forward. Pt verbalizes understanding, no further questions at this time.  Routed to Dr. Curt Bears for review.

## 2019-09-22 NOTE — Telephone Encounter (Signed)
Would continue to monitor for pauses if asymptomatic.

## 2019-09-23 ENCOUNTER — Telehealth: Payer: Self-pay | Admitting: Family Medicine

## 2019-09-23 NOTE — Telephone Encounter (Signed)
Abigail Medina, a 53 year old female with a medical history significant for hypertension, aneurysm of thoracic aorta, history of CVA, history of HIV on antiviral therapy, history of depression with anxiety, and history of focal neurological deficit notified on-call service for elevation in blood pressure.  Patient states that systolic blood pressures have been elevated well above 200.  She is inquiring about taking a second pill. Patient has a digital cuff that is fluctuating.  Last blood pressure 128/78.  Recommendation: Patient to report to office on 09/24/2019 for blood pressure check.  Also, patient will bring her digital cuff for comparison.  At that time we will do a medication assessment, antihypertensives may warrant adjustment.  Donia Pounds  APRN, MSN, FNP-C Patient [facility] [address] [place], [place] [id number] [phone]

## 2019-09-24 ENCOUNTER — Ambulatory Visit: Payer: Medicare Other

## 2019-09-24 VITALS — BP 110/82

## 2019-09-24 DIAGNOSIS — I10 Essential (primary) hypertension: Secondary | ICD-10-CM

## 2019-09-29 ENCOUNTER — Other Ambulatory Visit: Payer: Self-pay | Admitting: Family Medicine

## 2019-10-03 ENCOUNTER — Other Ambulatory Visit: Payer: Self-pay | Admitting: Internal Medicine

## 2019-10-03 DIAGNOSIS — I10 Essential (primary) hypertension: Secondary | ICD-10-CM

## 2019-10-05 ENCOUNTER — Other Ambulatory Visit: Payer: Self-pay | Admitting: Internal Medicine

## 2019-10-05 DIAGNOSIS — G4700 Insomnia, unspecified: Secondary | ICD-10-CM

## 2019-10-14 ENCOUNTER — Telehealth: Payer: Self-pay | Admitting: *Deleted

## 2019-10-14 DIAGNOSIS — I10 Essential (primary) hypertension: Secondary | ICD-10-CM

## 2019-10-14 LAB — CUP PACEART REMOTE DEVICE CHECK
Date Time Interrogation Session: 20201028040500
Implantable Pulse Generator Implant Date: 20200413

## 2019-10-14 NOTE — Telephone Encounter (Signed)
Spoke with patient to request manual transmission for review. Carelink alert received for 2 pause episodes. Available ECG is from 10/11/19 at 16:11, 5sec duration. Will need manual transmission for review of other episode. Pt agrees to send transmission.  Pt denies any recent syncope. Has occasional lightheadedness, previous episodes have not correlated with detected pause episodes. Advised we will review manual transmission when received and call if any new or concerning arrhythmias. Pt verbalizes understanding and agreement with plan.

## 2019-10-17 LAB — CUP PACEART REMOTE DEVICE CHECK
Date Time Interrogation Session: 20201031172728
Implantable Pulse Generator Implant Date: 20200413

## 2019-10-18 ENCOUNTER — Ambulatory Visit (INDEPENDENT_AMBULATORY_CARE_PROVIDER_SITE_OTHER): Payer: Medicare Other | Admitting: *Deleted

## 2019-10-18 DIAGNOSIS — I63132 Cerebral infarction due to embolism of left carotid artery: Secondary | ICD-10-CM

## 2019-10-18 NOTE — Telephone Encounter (Signed)
Decrease toprol XL to 25 mg.

## 2019-10-18 NOTE — Telephone Encounter (Signed)
Manual transmission received 10/14/19 at 19:34. Additional pause from 10/10/19 at 23:58 noted, 5sec duration. Per med list, patient is taking Toprol XL 50mg  daily, not clearly symptomatic with episodes, though she reports occasional lightheadedness. Routed to Dr. Curt Bears for review and any new recommendations.

## 2019-10-19 MED ORDER — METOPROLOL SUCCINATE ER 25 MG PO TB24: 25.0000 mg | ORAL_TABLET | Freq: Every day | ORAL | 3 refills | Status: DC

## 2019-10-19 NOTE — Telephone Encounter (Signed)
Spoke with patient. She is agreeable to decreasing Toprol XL to 25mg  daily. Prescription for 90 day supply sent to preferred pharmacy per pt request.  Pt currently checks BP daily. Advised to contact PCP if BP consistently running >130/80. Pt in agreement with plan, no further questions at this time.

## 2019-10-21 ENCOUNTER — Other Ambulatory Visit: Payer: Self-pay | Admitting: Internal Medicine

## 2019-10-21 DIAGNOSIS — G629 Polyneuropathy, unspecified: Secondary | ICD-10-CM

## 2019-10-29 ENCOUNTER — Other Ambulatory Visit: Payer: Self-pay | Admitting: Family Medicine

## 2019-10-29 ENCOUNTER — Other Ambulatory Visit: Payer: Self-pay | Admitting: Internal Medicine

## 2019-10-29 NOTE — Telephone Encounter (Signed)
Please fill if an appropriate request.

## 2019-11-10 NOTE — Progress Notes (Signed)
Carelink Summary Report / Loop Recorder 

## 2019-11-17 ENCOUNTER — Ambulatory Visit: Payer: Medicare Other | Admitting: Internal Medicine

## 2019-11-18 ENCOUNTER — Ambulatory Visit (INDEPENDENT_AMBULATORY_CARE_PROVIDER_SITE_OTHER): Payer: Medicare Other | Admitting: *Deleted

## 2019-11-18 DIAGNOSIS — I6990 Unspecified sequelae of unspecified cerebrovascular disease: Secondary | ICD-10-CM

## 2019-11-18 LAB — CUP PACEART REMOTE DEVICE CHECK
Date Time Interrogation Session: 20201203145234
Implantable Pulse Generator Implant Date: 20200413

## 2019-12-02 ENCOUNTER — Other Ambulatory Visit: Payer: Self-pay | Admitting: Family Medicine

## 2019-12-04 ENCOUNTER — Other Ambulatory Visit: Payer: Self-pay | Admitting: Cardiology

## 2019-12-06 ENCOUNTER — Telehealth: Payer: Self-pay | Admitting: Emergency Medicine

## 2019-12-06 NOTE — Telephone Encounter (Signed)
Unable to reach patient and unable to leave VM. Need to assess if symptomatic with 3 sec pause on 12/02/19 at 1409.

## 2019-12-08 NOTE — Telephone Encounter (Signed)
LMOVM requesting call back to DC.  Direct number and office hours provided. °

## 2019-12-09 NOTE — Telephone Encounter (Signed)
Patient returned call to main office number 12/09/19 at 10:58, but this RN was unavailable at that time.   Called back and LMOVM requesting call to direct DC phone number. Office hours and direct phone number provided.

## 2019-12-14 ENCOUNTER — Telehealth: Payer: Self-pay

## 2019-12-14 NOTE — Progress Notes (Signed)
ILR remote 

## 2019-12-14 NOTE — Telephone Encounter (Signed)
Left message, 3rd attempt  Chanetta Marshall, NP 12/14/2019 9:42 AM

## 2019-12-14 NOTE — Telephone Encounter (Signed)
I told the pt per phone the nurse was calling about a 3 sec pause on 12-02-2019. I asked her did she have any symptoms. She states she do not remember that day. She states usually when she feel that way she is eating food. She do not believe she is chewing her food properly. I told her if the nurse have any further questions she will give her a call back.

## 2019-12-20 ENCOUNTER — Ambulatory Visit (INDEPENDENT_AMBULATORY_CARE_PROVIDER_SITE_OTHER): Payer: Medicare Other | Admitting: *Deleted

## 2019-12-20 DIAGNOSIS — I6990 Unspecified sequelae of unspecified cerebrovascular disease: Secondary | ICD-10-CM

## 2019-12-20 LAB — CUP PACEART REMOTE DEVICE CHECK
Date Time Interrogation Session: 20210104002448
Implantable Pulse Generator Implant Date: 20200413

## 2019-12-27 ENCOUNTER — Other Ambulatory Visit: Payer: Self-pay | Admitting: Internal Medicine

## 2019-12-27 DIAGNOSIS — E559 Vitamin D deficiency, unspecified: Secondary | ICD-10-CM

## 2020-01-04 ENCOUNTER — Other Ambulatory Visit: Payer: Self-pay | Admitting: Family Medicine

## 2020-01-10 ENCOUNTER — Other Ambulatory Visit: Payer: Medicare Other

## 2020-01-10 ENCOUNTER — Ambulatory Visit: Payer: Medicare Other | Attending: Internal Medicine

## 2020-01-10 DIAGNOSIS — Z20822 Contact with and (suspected) exposure to covid-19: Secondary | ICD-10-CM

## 2020-01-11 LAB — NOVEL CORONAVIRUS, NAA: SARS-CoV-2, NAA: NOT DETECTED

## 2020-01-16 ENCOUNTER — Other Ambulatory Visit: Payer: Self-pay | Admitting: Internal Medicine

## 2020-01-16 DIAGNOSIS — B20 Human immunodeficiency virus [HIV] disease: Secondary | ICD-10-CM

## 2020-01-17 ENCOUNTER — Other Ambulatory Visit: Payer: Self-pay

## 2020-01-17 ENCOUNTER — Other Ambulatory Visit: Payer: Self-pay | Admitting: Internal Medicine

## 2020-01-17 DIAGNOSIS — B20 Human immunodeficiency virus [HIV] disease: Secondary | ICD-10-CM

## 2020-01-17 MED ORDER — DESCOVY 200-25 MG PO TABS: 1.0000 | ORAL_TABLET | Freq: Every day | ORAL | 0 refills | Status: DC

## 2020-01-17 MED ORDER — TIVICAY 50 MG PO TABS: 50.0000 mg | ORAL_TABLET | Freq: Every day | ORAL | 0 refills | Status: DC

## 2020-01-19 ENCOUNTER — Ambulatory Visit (INDEPENDENT_AMBULATORY_CARE_PROVIDER_SITE_OTHER): Payer: Medicare Other | Admitting: Nurse Practitioner

## 2020-01-19 ENCOUNTER — Other Ambulatory Visit: Payer: Self-pay

## 2020-01-19 ENCOUNTER — Encounter: Payer: Self-pay | Admitting: Nurse Practitioner

## 2020-01-19 VITALS — BP 123/92 | HR 69 | Temp 98.2°F | Resp 16 | Ht 63.0 in | Wt 172.0 lb

## 2020-01-19 DIAGNOSIS — Z0000 Encounter for general adult medical examination without abnormal findings: Secondary | ICD-10-CM

## 2020-01-19 DIAGNOSIS — I10 Essential (primary) hypertension: Secondary | ICD-10-CM | POA: Diagnosis not present

## 2020-01-19 DIAGNOSIS — G8929 Other chronic pain: Secondary | ICD-10-CM

## 2020-01-19 DIAGNOSIS — B20 Human immunodeficiency virus [HIV] disease: Secondary | ICD-10-CM | POA: Diagnosis not present

## 2020-01-19 DIAGNOSIS — M25552 Pain in left hip: Secondary | ICD-10-CM | POA: Diagnosis not present

## 2020-01-19 DIAGNOSIS — K219 Gastro-esophageal reflux disease without esophagitis: Secondary | ICD-10-CM

## 2020-01-19 LAB — POCT URINALYSIS DIPSTICK
Bilirubin, UA: NEGATIVE
Blood, UA: NEGATIVE
Glucose, UA: NEGATIVE
Ketones, UA: NEGATIVE
Leukocytes, UA: NEGATIVE
Nitrite, UA: NEGATIVE
Protein, UA: NEGATIVE
Spec Grav, UA: 1.015 (ref 1.010–1.025)
Urobilinogen, UA: 2 E.U./dL — AB
pH, UA: 6 (ref 5.0–8.0)

## 2020-01-19 MED ORDER — CIMETIDINE 200 MG PO TABS: 200.0000 mg | ORAL_TABLET | Freq: Two times a day (BID) | ORAL | 2 refills | Status: DC

## 2020-01-19 NOTE — Progress Notes (Signed)
Established Patient Office Visit  Subjective:  Patient ID: Abigail Medina, female    DOB: 1966/12/12  Age: 54 y.o. MRN: [id number]  CC:  Chief Complaint  Patient presents with  . Hypertension    sees cardiology   . Hyperlipidemia  . Medication Refill    tagamet     HPI Abigail Medina presents for follow-up.  She has a history of thoracic aortic aneurysm and is followed by cardiology.  She has history of a stroke, HIV, hypertension, depression and anxiety.  She admits that she does monitor her blood pressure generally in the 130s over 80s.  She denies any current headaches, lightheadedness, dizziness, shortness of breath or chest pains. She admits that she is having left hip pain that started a few days mom.  She denies any accident or injury.  She went to pick up her granddaughter, however felt a slip.  She admits that it has improved gradually each day.  She is having some pain that went to the left outer upper thigh.  She denies any numbness or tingling.     Past Medical History:  Diagnosis Date  . HIV (human immunodeficiency virus infection) ([place])   . Hypertension   . Seizures ([place])   . Stroke ([place])   . Vision abnormalities     Past Surgical History:  Procedure Laterality Date  . LOOP RECORDER INSERTION N/A 03/29/2019   Procedure: LOOP RECORDER INSERTION;  Surgeon: Constance Haw, MD;  Location: [facility];  Service: Cardiovascular;  Laterality: N/A;    Family History  Problem Relation Age of Onset  . Hypertension Mother     Social History   Socioeconomic History  . Marital status: Single    Spouse name: Not on file  . Number of children: Not on file  . Years of education: Not on file  . Highest education level: Not on file  Occupational History  . Not on file  Tobacco Use  . Smoking status: Current Some Day Smoker    Packs/day: 0.25    Types: Cigarettes    Start date: 10/17/2015  . Smokeless tobacco: Never Used  Substance and Sexual  Activity  . Alcohol use: Yes    Alcohol/week: 2.0 - 3.0 standard drinks    Types: 2 - 3 Standard drinks or equivalent per week    Comment: liquor or beer; stated has increased with social meetings  . Drug use: No  . Sexual activity: Yes    Partners: Male    Birth control/protection: Condom    Comment: accepted condoms  Other Topics Concern  . Not on file  Social History Narrative  . Not on file   Social Determinants of Health   Financial Resource Strain:   . Difficulty of Paying Living Expenses: Not on file  Food Insecurity:   . Worried About Charity fundraiser in the Last Year: Not on file  . Ran Out of Food in the Last Year: Not on file  Transportation Needs:   . Lack of Transportation (Medical): Not on file  . Lack of Transportation (Non-Medical): Not on file  Physical Activity:   . Days of Exercise per Week: Not on file  . Minutes of Exercise per Session: Not on file  Stress:   . Feeling of Stress : Not on file  Social Connections:   . Frequency of Communication with Friends and Family: Not on file  . Frequency of Social Gatherings with Friends and Family: Not on file  .  Attends Religious Services: Not on file  . Active Member of Clubs or Organizations: Not on file  . Attends Archivist Meetings: Not on file  . Marital Status: Not on file  Intimate Partner Violence:   . Fear of Current or Ex-Partner: Not on file  . Emotionally Abused: Not on file  . Physically Abused: Not on file  . Sexually Abused: Not on file    Outpatient Medications Prior to Visit  Medication Sig Dispense Refill  . albuterol (PROVENTIL HFA;VENTOLIN HFA) 108 (90 Base) MCG/ACT inhaler Inhale 2 puffs into the lungs every 4 (four) hours as needed for wheezing or shortness of breath (cough, shortness of breath or wheezing.). 1 Inhaler 6  . atorvastatin (LIPITOR) 40 MG tablet Take 1 tablet (40 mg total) by mouth daily at 6 PM. 90 tablet 1  . cetirizine (ZYRTEC) 10 MG tablet TAKE 1 TABLET  BY MOUTH EVERY DAY 90 tablet 3  . clopidogrel (PLAVIX) 75 MG tablet TAKE 1 TABLET BY MOUTH EVERY DAY 30 tablet 1  . [facility] B-12 500 MCG tablet TAKE 1 TABLET BY MOUTH EVERY DAY 30 tablet 1  . dolutegravir (TIVICAY) 50 MG tablet Take 1 tablet (50 mg total) by mouth daily. 30 tablet 0  . emtricitabine-tenofovir AF (DESCOVY) 200-25 MG tablet Take 1 tablet by mouth daily. 30 tablet 0  . fluticasone (FLONASE) 50 MCG/ACT nasal spray SPRAY 2 SPRAYS INTO EACH NOSTRIL EVERY DAY 48 mL 2  . hydrochlorothiazide (HYDRODIURIL) 25 MG tablet TAKE 1 TABLET BY MOUTH EVERY DAY 90 tablet 5  . metoprolol succinate (TOPROL XL) 25 MG 24 hr tablet Take 1 tablet (25 mg total) by mouth daily. 90 tablet 3  . PARoxetine (PAXIL) 30 MG tablet TAKE 1 TABLET BY MOUTH EVERY DAY (Patient taking differently: Take 30 mg by mouth daily. ) 90 tablet 4  . traZODone (DESYREL) 50 MG tablet Take 0.5 tablets (25 mg total) by mouth at bedtime as needed for sleep. 30 tablet 3  . Vitamin D, Ergocalciferol, (DRISDOL) 1.25 MG (50000 UNIT) CAPS capsule TAKE 1 CAPSULE BY MOUTH ONE TIME PER WEEK 8 capsule 1  . cimetidine (TAGAMET) 200 MG tablet Take 1 tablet (200 mg total) by mouth 2 (two) times daily. (Patient taking differently: Take 200 mg by mouth daily as needed (heartburn). ) 30 tablet 11  . gabapentin (NEURONTIN) 400 MG capsule Take 1 capsule (400 mg total) by mouth 3 (three) times daily. Start taking 1 tab daily x 7d, then twice a day x 7d, then can do TID (Patient not taking: Reported on 09/07/2019) 90 capsule 5   No facility-administered medications prior to visit.    Allergies  Allergen Reactions  . Lisinopril Swelling    ROS Review of Systems  Constitutional: Negative.   HENT: Negative.   Eyes: Negative.   Respiratory: Negative.   Cardiovascular: Negative.   Gastrointestinal: Negative.   Endocrine: Negative.   Genitourinary: Negative.       Objective:    Physical Exam  Constitutional: She is oriented to person, place, and  time. She appears well-developed and well-nourished.  HENT:  Head: Normocephalic.  Cardiovascular: Normal rate, regular rhythm and normal heart sounds.  Pulmonary/Chest: Effort normal and breath sounds normal.  Abdominal: Soft. Bowel sounds are normal.  Musculoskeletal:     Cervical back: Normal range of motion.     Comments: Adequate range of motion of the left hip  Neurological: She is alert and oriented to person, place, and time.  Skin: Skin  is warm and dry.  Psychiatric: She has a normal mood and affect. Her behavior is normal. Judgment and thought content normal.    BP (!) 123/92 (BP Location: Left Arm, Patient Position: Sitting, Cuff Size: Normal)   Pulse 69   Temp 98.2 F (36.8 C) (Oral)   Resp 16   Ht 5\' 3"  (1.6 m)   Wt 172 lb (78 kg)   LMP 08/04/2014   SpO2 100%   BMI 30.47 kg/m  Wt Readings from Last 3 Encounters:  01/19/20 172 lb (78 kg)  09/07/19 167 lb 9.6 oz (76 kg)  09/06/19 165 lb (74.8 kg)     There are no preventive care reminders to display for this patient.  There are no preventive care reminders to display for this patient.  Lab Results  Component Value Date   TSH 3.152 03/28/2019   Lab Results  Component Value Date   WBC 2.8 (L) 08/04/2019   HGB 14.2 08/04/2019   HCT 41.7 08/04/2019   MCV 91.4 08/04/2019   PLT 157 08/04/2019   Lab Results  Component Value Date   NA 141 08/04/2019   K 3.4 (L) 08/04/2019   CO2 29 08/04/2019   GLUCOSE 97 08/04/2019   BUN 14 08/04/2019   CREATININE 0.83 08/04/2019   BILITOT 0.8 08/04/2019   ALKPHOS 77 03/27/2019   AST 14 08/04/2019   ALT 17 08/04/2019   PROT 6.6 08/04/2019   ALBUMIN 4.0 03/27/2019   CALCIUM 8.9 08/04/2019   ANIONGAP 10 03/28/2019   Lab Results  Component Value Date   CHOL 199 07/26/2019   Lab Results  Component Value Date   HDL 56 07/26/2019   Lab Results  Component Value Date   LDLCALC 121 (H) 07/26/2019   Lab Results  Component Value Date   TRIG 112 07/26/2019   Lab  Results  Component Value Date   CHOLHDL 3.6 07/26/2019   Lab Results  Component Value Date   HGBA1C 5.2 03/28/2019      Assessment & Plan:   Problem List Items Addressed This Visit      High   Essential hypertension - Primary   Relevant Orders   Urinalysis Dipstick (Completed)   HIV (human immunodeficiency virus infection) ([place]) (Chronic)     Low   Healthcare maintenance   Relevant Orders   Ambulatory referral to Gastroenterology    Other Visit Diagnoses    Gastroesophageal reflux disease without esophagitis       Continue current regimen   Relevant Medications   cimetidine (TAGAMET) 200 MG tablet   Other Relevant Orders   Ambulatory referral to Gastroenterology   Chronic hip pain, left       Continue to monitor X-ray place in case needed if symptoms progress   Relevant Orders   DG Hip Unilat W OR W/O Pelvis 2-3 Views Left      Meds ordered this encounter  Medications  . cimetidine (TAGAMET) 200 MG tablet    Sig: Take 1 tablet (200 mg total) by mouth 2 (two) times daily.    Dispense:  60 tablet    Refill:  2    Order Specific Question:   Supervising Provider    Answer:   Tresa Garter [id number]    Follow-up: Return in about 6 months (around 07/18/2020).    [name], NP

## 2020-01-19 NOTE — Patient Instructions (Signed)
Hip Pain The hip is the joint between the upper legs and the lower pelvis. The bones, cartilage, tendons, and muscles of your hip joint support your body and allow you to move around. Hip pain can range from a minor ache to severe pain in one or both of your hips. The pain may be felt on the inside of the hip joint near the groin, or on the outside near the buttocks and upper thigh. You may also have swelling or stiffness in your hip area. Follow these instructions at home: Managing pain, stiffness, and swelling      If directed, put ice on the painful area. To do this: ? Put ice in a plastic bag. ? Place a towel between your skin and the bag. ? Leave the ice on for 20 minutes, 2-3 times a day.  If directed, apply heat to the affected area as often as told by your health care provider. Use the heat source that your health care provider recommends, such as a moist heat pack or a heating pad. ? Place a towel between your skin and the heat source. ? Leave the heat on for 20-30 minutes. ? Remove the heat if your skin turns bright red. This is especially important if you are unable to feel pain, heat, or cold. You may have a greater risk of getting burned. Activity  Do exercises as told by your health care provider.  Avoid activities that cause pain. General instructions   Take over-the-counter and prescription medicines only as told by your health care provider.  Keep a journal of your symptoms. Write down: ? How often you have hip pain. ? The location of your pain. ? What the pain feels like. ? What makes the pain worse.  Sleep with a pillow between your legs on your most comfortable side.  Keep all follow-up visits as told by your health care provider. This is important. Contact a health care provider if:  You cannot put weight on your leg.  Your pain or swelling continues or gets worse after one week.  It gets harder to walk.  You have a fever. Get help right away  if:  You fall.  You have a sudden increase in pain and swelling in your hip.  Your hip is red or swollen or very tender to touch. Summary  Hip pain can range from a minor ache to severe pain in one or both of your hips.  The pain may be felt on the inside of the hip joint near the groin, or on the outside near the buttocks and upper thigh.  Avoid activities that cause pain.  Write down how often you have hip pain, the location of the pain, what makes it worse, and what it feels like. This information is not intended to replace advice given to you by your health care provider. Make sure you discuss any questions you have with your health care provider. Document Revised: 04/19/2019 Document Reviewed: 04/19/2019 Elsevier Patient Education  2020 [employer] -- 

## 2020-01-20 ENCOUNTER — Ambulatory Visit (INDEPENDENT_AMBULATORY_CARE_PROVIDER_SITE_OTHER): Payer: Medicare Other | Admitting: *Deleted

## 2020-01-20 DIAGNOSIS — I6990 Unspecified sequelae of unspecified cerebrovascular disease: Secondary | ICD-10-CM

## 2020-01-20 LAB — CUP PACEART REMOTE DEVICE CHECK
Date Time Interrogation Session: 20210204002803
Implantable Pulse Generator Implant Date: 20200412200000

## 2020-01-20 NOTE — Progress Notes (Signed)
ILR Remote 

## 2020-02-01 ENCOUNTER — Other Ambulatory Visit: Payer: Self-pay | Admitting: Family Medicine

## 2020-02-02 ENCOUNTER — Other Ambulatory Visit: Payer: Self-pay | Admitting: Internal Medicine

## 2020-02-02 ENCOUNTER — Encounter: Payer: Self-pay | Admitting: Internal Medicine

## 2020-02-02 ENCOUNTER — Ambulatory Visit (INDEPENDENT_AMBULATORY_CARE_PROVIDER_SITE_OTHER): Payer: Medicare Other | Admitting: Internal Medicine

## 2020-02-02 ENCOUNTER — Other Ambulatory Visit: Payer: Self-pay

## 2020-02-02 VITALS — BP 130/98 | HR 73 | Wt 171.0 lb

## 2020-02-02 DIAGNOSIS — B20 Human immunodeficiency virus [HIV] disease: Secondary | ICD-10-CM | POA: Diagnosis not present

## 2020-02-02 DIAGNOSIS — I10 Essential (primary) hypertension: Secondary | ICD-10-CM | POA: Diagnosis not present

## 2020-02-02 DIAGNOSIS — Z0000 Encounter for general adult medical examination without abnormal findings: Secondary | ICD-10-CM

## 2020-02-02 DIAGNOSIS — Z79899 Other long term (current) drug therapy: Secondary | ICD-10-CM | POA: Diagnosis not present

## 2020-02-02 NOTE — Progress Notes (Signed)
RFV: follow up for hiv disease  Patient ID: Abigail Medina, female   DOB: [birth year], 54 y.o.   MRN: [id number]  HPI 54yo F with well controlled hiv disease, CD 4 count 496/VL<20, (aug 2020) On tivicay/descovy, not missing doses. Also other medical problems - stroke, htn, thoracic aneurysm, s/p PPM. She is doing well overall. Had covid scare since her grandkids had it but not her daughter. Has not had it and wears masks.   uptodate on health maintenance - has had pap smear ( in April) ,and mammo in (in June) has not had CSY  Outpatient Encounter Medications as of 02/02/2020  Medication Sig  . emtricitabine-tenofovir AF (DESCOVY) 200-25 MG tablet Take 1 tablet by mouth daily.  Marland Kitchen albuterol (PROVENTIL HFA;VENTOLIN HFA) 108 (90 Base) MCG/ACT inhaler Inhale 2 puffs into the lungs every 4 (four) hours as needed for wheezing or shortness of breath (cough, shortness of breath or wheezing.).  Marland Kitchen atorvastatin (LIPITOR) 40 MG tablet TAKE 1 TABLET BY MOUTH DAILY AT 6 PM.  . cetirizine (ZYRTEC) 10 MG tablet TAKE 1 TABLET BY MOUTH EVERY DAY  . cimetidine (TAGAMET) 200 MG tablet Take 1 tablet (200 mg total) by mouth 2 (two) times daily.  . clopidogrel (PLAVIX) 75 MG tablet TAKE 1 TABLET BY MOUTH EVERY DAY  . [facility] B-12 500 MCG tablet TAKE 1 TABLET BY MOUTH EVERY DAY  . dolutegravir (TIVICAY) 50 MG tablet Take 1 tablet (50 mg total) by mouth daily.  . fluticasone (FLONASE) 50 MCG/ACT nasal spray SPRAY 2 SPRAYS INTO EACH NOSTRIL EVERY DAY  . gabapentin (NEURONTIN) 400 MG capsule Take 1 capsule (400 mg total) by mouth 3 (three) times daily. Start taking 1 tab daily x 7d, then twice a day x 7d, then can do TID (Patient not taking: Reported on 09/07/2019)  . hydrochlorothiazide (HYDRODIURIL) 25 MG tablet TAKE 1 TABLET BY MOUTH EVERY DAY  . metoprolol succinate (TOPROL XL) 25 MG 24 hr tablet Take 1 tablet (25 mg total) by mouth daily.  Marland Kitchen PARoxetine (PAXIL) 30 MG tablet TAKE 1 TABLET BY MOUTH EVERY DAY (Patient  taking differently: Take 30 mg by mouth daily. )  . traZODone (DESYREL) 50 MG tablet Take 0.5 tablets (25 mg total) by mouth at bedtime as needed for sleep.  . Vitamin D, Ergocalciferol, (DRISDOL) 1.25 MG (50000 UNIT) CAPS capsule TAKE 1 CAPSULE BY MOUTH ONE TIME PER WEEK   No facility-administered encounter medications on file as of 02/02/2020.     Patient Active Problem List   Diagnosis Date Noted  . Healthcare maintenance 04/13/2019  . Aneurysm of thoracic aorta ([place]) 03/29/2019  . Cerebral thrombosis with cerebral infarction 03/28/2019  . CVA (cerebral vascular accident) ([place]) 03/28/2019  . Acute focal neurological deficit 03/27/2019  . Hypokalemia 03/27/2019  . Hyperbilirubinemia 03/27/2019  . History of CVA with residual deficit   . Pain of back and left lower extremity 12/15/2018  . Upper respiratory infection, viral 12/15/2018  . 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 Tobacco dependence 06/02/2015  . Numbness and tingling of right arm 05/30/2015  . Right arm weakness 05/30/2015  . Arm numbness left 10/10/2014  . Vitamin D deficiency 08/05/2014  . Allergic rhinitis 05/25/2013  . 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 Polypharmacy 08/20/2012  . HIV (human immunodeficiency virus infection) ([place]) 08/20/2012  . HLD (hyperlipidemia) 07/15/2012  . BP (high blood pressure) 07/15/2012  . Cerebrovascular accident, late effects 01/30/2012  . Depression with anxiety 07/15/2011  . Seborrhea capitis 04/18/2011  . Seborrheic  dermatitis 03/07/2011  . 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 VAGINAL DISCHARGE 12/26/2010  . NEVUS 08/29/2010  . AMENORRHEA 07/19/2010  . SKIN RASH 05/21/2010  . THRUSH 12/28/2009  . Human immunodeficiency virus (HIV) disease ([place]) 08/06/2007  . Essential hypertension 08/06/2007   Social History   Tobacco Use  . Smoking status: Current Some Day Smoker    Packs/day: 0.25    Types: Cigarettes    Start date: 10/17/2015  . Smokeless  tobacco: Never Used  Substance Use Topics  . Alcohol use: Yes    Alcohol/week: 2.0 - 3.0 standard drinks    Types: 2 - 3 Standard drinks or equivalent per week    Comment: liquor or beer; stated has increased with social meetings  . Drug use: No    There are no preventive care reminders to display for this patient.   Review of Systems Review of Systems  Constitutional: Negative for fever, chills, diaphoresis, activity change, appetite change, fatigue and unexpected weight change.  HENT: Negative for congestion, sore throat, rhinorrhea, sneezing, trouble swallowing and sinus pressure.  Eyes: Negative for photophobia and visual disturbance.  Respiratory: Negative for cough, chest tightness, shortness of breath, wheezing and stridor.  Cardiovascular: Negative for chest pain, palpitations and leg swelling.  Gastrointestinal: Negative for nausea, vomiting, abdominal pain, diarrhea, constipation, blood in stool, abdominal distention and anal bleeding.  Genitourinary: Negative for dysuria, hematuria, flank pain and difficulty urinating.  Musculoskeletal: Negative for myalgias, back pain, joint swelling, arthralgias and gait problem.  Skin: Negative for color change, pallor, rash and wound.  Neurological: Negative for dizziness, tremors, weakness and light-headedness.  Hematological: Negative for adenopathy. Does not bruise/bleed easily.  Psychiatric/Behavioral: Negative for behavioral problems, confusion, sleep disturbance, dysphoric mood, decreased concentration and agitation.    Physical Exam   BP (!) 130/98   Pulse 73   Wt 171 lb (77.6 kg)   LMP 08/04/2014   BMI 30.29 kg/m   Physical Exam  Constitutional:  oriented to person, place, and time. appears well-developed and well-nourished. No distress.  HENT: [place]/AT, PERRLA, no scleral icterus Mouth/Throat: Oropharynx is clear and moist. No oropharyngeal exudate.  Cardiovascular: Normal rate, regular rhythm and normal heart sounds. Exam  reveals no gallop and no friction rub.  No murmur heard.  Pulmonary/Chest: Effort normal and breath sounds normal. No respiratory distress.  has no wheezes.  Neck = supple, no nuchal rigidity Abdominal: Soft. Bowel sounds are normal.  exhibits no distension. There is no tenderness.  Lymphadenopathy: no cervical adenopathy. No axillary adenopathy Neurological: alert and oriented to person, place, and time.  Skin: Skin is warm and dry. No rash noted. No erythema.  Psychiatric: a normal mood and affect.  behavior is normal.   Lab Results  Component Value Date   CD4TCELL 39 08/04/2019   Lab Results  Component Value Date   CD4TABS 496 08/04/2019   CD4TABS 448 04/13/2019   CD4TABS 400 01/06/2019   Lab Results  Component Value Date   HIV1RNAQUANT <20 NOT DETECTED 08/04/2019   Lab Results  Component Value Date   HEPBSAB NEG 03/10/2014   Lab Results  Component Value Date   LABRPR NON-REACTIVE 08/04/2019    CBC Lab Results  Component Value Date   WBC 2.8 (L) 08/04/2019   RBC 4.56 08/04/2019   HGB 14.2 08/04/2019   HCT 41.7 08/04/2019   PLT 157 08/04/2019   MCV 91.4 08/04/2019   MCH 31.1 08/04/2019   MCHC 34.1 08/04/2019   RDW 13.7  08/04/2019   LYMPHSABS 1,350 08/04/2019   MONOABS 0.4 03/27/2019   EOSABS 109 08/04/2019    BMET Lab Results  Component Value Date   NA 141 08/04/2019   K 3.4 (L) 08/04/2019   CL 105 08/04/2019   CO2 29 08/04/2019   GLUCOSE 97 08/04/2019   BUN 14 08/04/2019   CREATININE 0.83 08/04/2019   CALCIUM 8.9 08/04/2019   GFRNONAA 79 08/04/2019   GFRAA 92 08/04/2019    Assessment and Plan  HIV disease = well controlled.interested in injectables. Will get 6 month labs today  Memory difficulty ? = made recommendations to place appt in phone/calendar. May need to start evaluating for hiv related neuro impact if continues to worsen. In the meantime, continue with reminders.  htn = well controlled. No need to change meds  Health maintenance =  will refer to gi for colonoscopy for cancer screening

## 2020-02-03 LAB — T-HELPER CELL (CD4) - (RCID CLINIC ONLY)
CD4 % Helper T Cell: 39 % (ref 33–65)
CD4 T Cell Abs: 573 /uL (ref 400–1790)

## 2020-02-05 LAB — COMPLETE METABOLIC PANEL WITH GFR
AG Ratio: 1.6 (calc) (ref 1.0–2.5)
ALT: 16 U/L (ref 6–29)
AST: 14 U/L (ref 10–35)
Albumin: 4 g/dL (ref 3.6–5.1)
Alkaline phosphatase (APISO): 63 U/L (ref 37–153)
BUN: 15 mg/dL (ref 7–25)
CO2: 28 mmol/L (ref 20–32)
Calcium: 9.1 mg/dL (ref 8.6–10.4)
Chloride: 104 mmol/L (ref 98–110)
Creat: 0.91 mg/dL (ref 0.50–1.05)
GFR, Est African American: 82 mL/min/{1.73_m2} (ref >=60)
GFR, Est Non African American: 71 mL/min/{1.73_m2} (ref >=60)
Globulin: 2.5 g/dL (calc) (ref 1.9–3.7)
Glucose, Bld: 103 mg/dL — ABNORMAL HIGH (ref 65–99)
Potassium: 3.5 mmol/L (ref 3.5–5.3)
Sodium: 140 mmol/L (ref 135–146)
Total Bilirubin: 0.6 mg/dL (ref 0.2–1.2)
Total Protein: 6.5 g/dL (ref 6.1–8.1)

## 2020-02-05 LAB — CBC WITH DIFFERENTIAL/PLATELET
Absolute Monocytes: 313 cells/uL (ref 200–950)
Basophils Absolute: 20 cells/uL (ref 0–200)
Basophils Relative: 0.6 %
Eosinophils Absolute: 122 cells/uL (ref 15–500)
Eosinophils Relative: 3.6 %
HCT: 42.5 % (ref 35.0–45.0)
Hemoglobin: 15 g/dL (ref 11.7–15.5)
Lymphs Abs: 1482 cells/uL (ref 850–3900)
MCH: 32.8 pg (ref 27.0–33.0)
MCHC: 35.3 g/dL (ref 32.0–36.0)
MCV: 92.8 fL (ref 80.0–100.0)
MPV: 11.4 fL (ref 7.5–12.5)
Monocytes Relative: 9.2 %
Neutro Abs: 1462 cells/uL — ABNORMAL LOW (ref 1500–7800)
Neutrophils Relative %: 43 %
Platelets: 179 10*3/uL (ref 140–400)
RBC: 4.58 10*6/uL (ref 3.80–5.10)
RDW: 12.6 % (ref 11.0–15.0)
Total Lymphocyte: 43.6 %
WBC: 3.4 10*3/uL — ABNORMAL LOW (ref 3.8–10.8)

## 2020-02-05 LAB — HIV-1 RNA QUANT-NO REFLEX-BLD
HIV 1 RNA Quant: 29 copies/mL — ABNORMAL HIGH
HIV-1 RNA Quant, Log: 1.46 Log copies/mL — ABNORMAL HIGH

## 2020-02-05 LAB — LIPID PANEL
Cholesterol: 132 mg/dL (ref <200)
HDL: 43 mg/dL — ABNORMAL LOW (ref >=50)
LDL Cholesterol (Calc): 69 mg/dL (calc)
Non-HDL Cholesterol (Calc): 89 mg/dL (calc) (ref <130)
Total CHOL/HDL Ratio: 3.1 (calc) (ref <5.0)
Triglycerides: 115 mg/dL (ref <150)

## 2020-02-05 LAB — RPR: RPR Ser Ql: NONREACTIVE

## 2020-02-07 ENCOUNTER — Telehealth: Payer: Self-pay | Admitting: Emergency Medicine

## 2020-02-07 NOTE — Telephone Encounter (Signed)
LMOM to call device clinic and # provided. Need to assess patient for symptoms due to alert received : 02/02/20 @ 2112 due to 2 second pause followed by 3 second pause.

## 2020-02-08 ENCOUNTER — Telehealth: Payer: Self-pay | Admitting: Gastroenterology

## 2020-02-08 NOTE — Telephone Encounter (Signed)
Left message for patient to call. She has had similar pause episodes in the past and we have been unable to reach her.  Chanetta Marshall, NP 02/08/2020 8:44 AM

## 2020-02-08 NOTE — Telephone Encounter (Signed)
Pt returned phone call.  Pt confirms she would have definitely been awake at time of episode.  In discussing symptoms that she might have experienced she says that she does feel lightheaded or presyncopal like she is "going to fall out" at times when she is eating.  She reports time of episode does coincide when she might have been eating. But since it was almost a week ago she is not certain that she was symptomatic at time of this episode.    Pt does have symptom maker, encouraged pt to use symptom marker next time she has feeling.    Pt confirms she is taking Metoprolol 25mg  daily as ordered.

## 2020-02-08 NOTE — Telephone Encounter (Signed)
Please review this pt's previous OV notes from 2020 and advise whether she can be scheduled for a direct colonoscopy or will need another OV.

## 2020-02-08 NOTE — Telephone Encounter (Signed)
Follow up  ° ° °Pt is returning call  ° ° °Please call back  °

## 2020-02-09 NOTE — Telephone Encounter (Signed)
If no BP issues would hold metoprolol due to significant pauses and bradycardia.

## 2020-02-09 NOTE — Telephone Encounter (Signed)
Spoke with pt, advised of MD recommendations.  She reports hse does not check BP frequently but will start monitoring and hold Metoprolol as long as BP not elevated >140/100

## 2020-02-09 NOTE — Telephone Encounter (Signed)
Please have her come in for OV with Dr. Fuller Plan or APP

## 2020-02-12 ENCOUNTER — Other Ambulatory Visit: Payer: Self-pay | Admitting: Internal Medicine

## 2020-02-12 DIAGNOSIS — B20 Human immunodeficiency virus [HIV] disease: Secondary | ICD-10-CM

## 2020-02-18 ENCOUNTER — Ambulatory Visit: Payer: Medicare Other | Admitting: Nurse Practitioner

## 2020-02-21 ENCOUNTER — Ambulatory Visit (INDEPENDENT_AMBULATORY_CARE_PROVIDER_SITE_OTHER): Payer: Medicare Other | Admitting: *Deleted

## 2020-02-21 DIAGNOSIS — I6990 Unspecified sequelae of unspecified cerebrovascular disease: Secondary | ICD-10-CM

## 2020-02-21 LAB — CUP PACEART REMOTE DEVICE CHECK
Date Time Interrogation Session: 20210308024516
Implantable Pulse Generator Implant Date: 20200413

## 2020-02-22 NOTE — Progress Notes (Signed)
ILR Remote 

## 2020-02-28 ENCOUNTER — Telehealth: Payer: Self-pay | Admitting: *Deleted

## 2020-02-28 NOTE — Telephone Encounter (Addendum)
Full report received 02/26/20, revealed pause episode on 02/16/20 at 14:01, 4sec duration. Spoke with patient. She does not recall symptoms with episode. Reviewed symptom activator use for symptomatic episodes.   Pt reports she has avoided taking Toprol (as instructed by Dr. Curt Bears on 02/09/20) until today as BP has been controlled. Today reports that she checked her BP and it was 159/111 so she took Toprol 25mg . Pt confirms that BP has been managed by PCP, now seeing Dionisio David, NP. Explained to pt that she may need to f/u with PCP to discuss transitioning to BP medication that will not affect her HR. Advised will forward message to Dr. Curt Bears for recommendations. Pt verbalizes understanding and agreement with plan.

## 2020-02-29 NOTE — Telephone Encounter (Signed)
Would hold off on taking metoprolol. If BP elevated should discuss with her PCP.

## 2020-03-01 NOTE — Telephone Encounter (Signed)
Spoke with patient to advise of Dr. Macky Lower recommendations. Pt verbalizes understanding of instructions to avoid taking metoprolol and to follow-up with her PCP to discuss BP control. She denies any questions at this time.

## 2020-03-09 ENCOUNTER — Other Ambulatory Visit: Payer: Self-pay

## 2020-03-09 MED ORDER — CYANOCOBALAMIN 500 MCG PO TABS: 500.0000 ug | ORAL_TABLET | Freq: Every day | ORAL | 1 refills | Status: DC

## 2020-03-15 ENCOUNTER — Other Ambulatory Visit: Payer: Self-pay | Admitting: Internal Medicine

## 2020-03-15 ENCOUNTER — Other Ambulatory Visit: Payer: Self-pay

## 2020-03-15 DIAGNOSIS — B20 Human immunodeficiency virus [HIV] disease: Secondary | ICD-10-CM

## 2020-03-15 MED ORDER — DESCOVY 200-25 MG PO TABS: 1.0000 | ORAL_TABLET | Freq: Every day | ORAL | 4 refills | Status: DC

## 2020-03-15 MED ORDER — TIVICAY 50 MG PO TABS: 50.0000 mg | ORAL_TABLET | Freq: Every day | ORAL | 4 refills | Status: DC

## 2020-03-22 ENCOUNTER — Other Ambulatory Visit: Payer: Self-pay | Admitting: Nurse Practitioner

## 2020-03-22 ENCOUNTER — Other Ambulatory Visit: Payer: Self-pay

## 2020-03-22 MED ORDER — CYANOCOBALAMIN 500 MCG PO TABS: 500.0000 ug | ORAL_TABLET | Freq: Every day | ORAL | 1 refills | Status: DC

## 2020-03-22 MED ORDER — AMLODIPINE BESYLATE 10 MG PO TABS: 10.0000 mg | ORAL_TABLET | Freq: Every day | ORAL | 11 refills | Status: DC

## 2020-03-22 NOTE — Progress Notes (Signed)
Home Visit provider called in Abigail Medina BP diastolic is greater than [id number] on to different readings. Abigail Medina indicates that she has recently discontinued the metoprolol as directed by cardiology. She is not symptomatic.   Amlodipine 10 mg daily  She will check her BP daily and follow up in one week with readings and then we will have her follow up in 4 weeks or sooner if needed.

## 2020-03-23 ENCOUNTER — Ambulatory Visit (INDEPENDENT_AMBULATORY_CARE_PROVIDER_SITE_OTHER): Payer: Medicare Other | Admitting: *Deleted

## 2020-03-23 DIAGNOSIS — I6990 Unspecified sequelae of unspecified cerebrovascular disease: Secondary | ICD-10-CM | POA: Diagnosis not present

## 2020-03-23 LAB — CUP PACEART REMOTE DEVICE CHECK
Date Time Interrogation Session: 20210408034722
Implantable Pulse Generator Implant Date: 20200413

## 2020-03-23 NOTE — Progress Notes (Signed)
ILR Remote 

## 2020-04-15 ENCOUNTER — Other Ambulatory Visit: Payer: Self-pay | Admitting: Internal Medicine

## 2020-04-15 DIAGNOSIS — G4700 Insomnia, unspecified: Secondary | ICD-10-CM

## 2020-04-23 LAB — CUP PACEART REMOTE DEVICE CHECK
Date Time Interrogation Session: 20210509035146
Implantable Pulse Generator Implant Date: 20200413

## 2020-04-25 ENCOUNTER — Other Ambulatory Visit: Payer: Self-pay | Admitting: Family Medicine

## 2020-04-25 ENCOUNTER — Ambulatory Visit (INDEPENDENT_AMBULATORY_CARE_PROVIDER_SITE_OTHER): Payer: Medicare Other | Admitting: *Deleted

## 2020-04-25 DIAGNOSIS — I63132 Cerebral infarction due to embolism of left carotid artery: Secondary | ICD-10-CM

## 2020-04-25 DIAGNOSIS — E559 Vitamin D deficiency, unspecified: Secondary | ICD-10-CM

## 2020-04-27 NOTE — Progress Notes (Signed)
Carelink Summary Report / Loop Recorder 

## 2020-05-19 ENCOUNTER — Other Ambulatory Visit: Payer: Self-pay | Admitting: Nurse Practitioner

## 2020-05-19 NOTE — Telephone Encounter (Signed)
Refill request for Vit B-12

## 2020-05-29 ENCOUNTER — Ambulatory Visit (INDEPENDENT_AMBULATORY_CARE_PROVIDER_SITE_OTHER): Payer: Medicare Other | Admitting: *Deleted

## 2020-05-29 DIAGNOSIS — I63132 Cerebral infarction due to embolism of left carotid artery: Secondary | ICD-10-CM | POA: Diagnosis not present

## 2020-05-29 LAB — CUP PACEART REMOTE DEVICE CHECK
Date Time Interrogation Session: 20210614001037
Implantable Pulse Generator Implant Date: 20200413

## 2020-05-31 NOTE — Progress Notes (Signed)
Carelink Summary Report / Loop Recorder 

## 2020-06-16 ENCOUNTER — Other Ambulatory Visit: Payer: Self-pay | Admitting: Internal Medicine

## 2020-06-18 ENCOUNTER — Other Ambulatory Visit: Payer: Self-pay | Admitting: Internal Medicine

## 2020-06-18 DIAGNOSIS — G4700 Insomnia, unspecified: Secondary | ICD-10-CM

## 2020-07-02 ENCOUNTER — Other Ambulatory Visit: Payer: Self-pay | Admitting: Internal Medicine

## 2020-07-02 NOTE — Telephone Encounter (Signed)
[facility] does not work with this practice. Forwarded to provider that pt has upcoming appt. Please update pt's PCP on chart. Thank you!

## 2020-07-03 ENCOUNTER — Ambulatory Visit (INDEPENDENT_AMBULATORY_CARE_PROVIDER_SITE_OTHER): Payer: Medicare Other | Admitting: *Deleted

## 2020-07-03 DIAGNOSIS — I63132 Cerebral infarction due to embolism of left carotid artery: Secondary | ICD-10-CM

## 2020-07-03 LAB — CUP PACEART REMOTE DEVICE CHECK
Date Time Interrogation Session: 20210718231651
Implantable Pulse Generator Implant Date: 20200413

## 2020-07-05 NOTE — Progress Notes (Signed)
Carelink Summary Report / Loop Recorder 

## 2020-07-17 ENCOUNTER — Telehealth: Payer: Self-pay | Admitting: Nurse Practitioner

## 2020-07-17 NOTE — Telephone Encounter (Signed)
Patient has a device for heart and I have called and asked her to follow up with Cardiology for them to ok this. Patient verbalized understanding. Thanks!

## 2020-07-19 ENCOUNTER — Ambulatory Visit: Payer: Medicare Other | Admitting: Nurse Practitioner

## 2020-07-20 ENCOUNTER — Other Ambulatory Visit: Payer: Self-pay | Admitting: Internal Medicine

## 2020-07-20 ENCOUNTER — Other Ambulatory Visit: Payer: Self-pay

## 2020-07-20 DIAGNOSIS — B20 Human immunodeficiency virus [HIV] disease: Secondary | ICD-10-CM

## 2020-07-20 MED ORDER — DESCOVY 200-25 MG PO TABS: 1.0000 | ORAL_TABLET | Freq: Every day | ORAL | 0 refills | Status: DC

## 2020-07-20 MED ORDER — TIVICAY 50 MG PO TABS: 50.0000 mg | ORAL_TABLET | Freq: Every day | ORAL | 0 refills | Status: DC

## 2020-07-23 ENCOUNTER — Other Ambulatory Visit: Payer: Self-pay | Admitting: Family Medicine

## 2020-07-23 DIAGNOSIS — E559 Vitamin D deficiency, unspecified: Secondary | ICD-10-CM

## 2020-08-05 LAB — CUP PACEART REMOTE DEVICE CHECK
Date Time Interrogation Session: 20210819001008
Implantable Pulse Generator Implant Date: 20200413

## 2020-08-07 ENCOUNTER — Ambulatory Visit (INDEPENDENT_AMBULATORY_CARE_PROVIDER_SITE_OTHER): Payer: Medicare Other | Admitting: *Deleted

## 2020-08-07 ENCOUNTER — Other Ambulatory Visit: Payer: Self-pay | Admitting: Nurse Practitioner

## 2020-08-07 DIAGNOSIS — I63132 Cerebral infarction due to embolism of left carotid artery: Secondary | ICD-10-CM | POA: Diagnosis not present

## 2020-08-09 ENCOUNTER — Other Ambulatory Visit: Payer: Self-pay

## 2020-08-09 ENCOUNTER — Encounter: Payer: Self-pay | Admitting: Nurse Practitioner

## 2020-08-09 ENCOUNTER — Ambulatory Visit (INDEPENDENT_AMBULATORY_CARE_PROVIDER_SITE_OTHER): Payer: Medicare Other | Admitting: Nurse Practitioner

## 2020-08-09 VITALS — BP 122/88 | HR 87 | Temp 97.4°F | Resp 20 | Ht 63.0 in | Wt 161.8 lb

## 2020-08-09 DIAGNOSIS — E559 Vitamin D deficiency, unspecified: Secondary | ICD-10-CM | POA: Diagnosis not present

## 2020-08-09 DIAGNOSIS — E785 Hyperlipidemia, unspecified: Secondary | ICD-10-CM

## 2020-08-09 DIAGNOSIS — I10 Essential (primary) hypertension: Secondary | ICD-10-CM | POA: Diagnosis not present

## 2020-08-09 DIAGNOSIS — B20 Human immunodeficiency virus [HIV] disease: Secondary | ICD-10-CM

## 2020-08-09 DIAGNOSIS — K219 Gastro-esophageal reflux disease without esophagitis: Secondary | ICD-10-CM

## 2020-08-09 NOTE — Progress Notes (Signed)
[place], [place]  [address] Phone:  [phone]   Fax:  [phone]   Established Patient Office Visit  Subjective:  Patient ID: Abigail Medina, female    DOB: [birth year]  Age: 54 y.o. MRN: [id number]  CC:  Chief Complaint  Patient presents with  . Follow-up    HPI Abigail Medina presents for follow up. She  has a past medical history of HIV (human immunodeficiency virus infection) ([place]), Hypertension, Seizures ([place]), Stroke ([place]), and Vision abnormalities.   Hypertension Patient is here for follow-up of elevated blood pressure. She is not exercising and is somewhat adherent to a low-salt diet. Blood pressure is not monitored at home. Cardiac symptoms: lower extremity edema. Patient denies chest pain, dyspnea, exertional chest pressure/discomfort, fatigue, irregular heart beat, palpitations and syncope. Cardiovascular risk factors: dyslipidemia and hypertension. Use of agents associated with hypertension: none. History of target organ damage: stroke and Aortic aneurysm.  Past Medical History:  Diagnosis Date  . HIV (human immunodeficiency virus infection) ([place])   . Hypertension   . Seizures ([place])   . Stroke ([place])   . Vision abnormalities     Past Surgical History:  Procedure Laterality Date  . LOOP RECORDER INSERTION N/A 03/29/2019   Procedure: LOOP RECORDER INSERTION;  Surgeon: Constance Haw, MD;  Location: [facility];  Service: Cardiovascular;  Laterality: N/A;    Family History  Problem Relation Age of Onset  . Hypertension Mother     Social History   Socioeconomic History  . Marital status: Single    Spouse name: Not on file  . Number of children: Not on file  . Years of education: Not on file  . Highest education level: Not on file  Occupational History  . Not on file  Tobacco Use  . Smoking status: Former Smoker    Packs/day: 0.00    Types: Cigarettes    Start date: 10/17/2015    Quit  date: 08/10/2019    Years since quitting: 1.0  . Smokeless tobacco: Never Used  Vaping Use  . Vaping Use: Never used  Substance and Sexual Activity  . Alcohol use: Yes    Alcohol/week: 2.0 - 3.0 standard drinks    Types: 2 - 3 Standard drinks or equivalent per week    Comment: liquor or beer; stated has increased with social meetings  . Drug use: No  . Sexual activity: Yes    Partners: Male    Birth control/protection: Condom    Comment: accepted condoms  Other Topics Concern  . Not on file  Social History Narrative  . Not on file   Social Determinants of Health   Financial Resource Strain:   . Difficulty of Paying Living Expenses: Not on file  Food Insecurity:   . Worried About Charity fundraiser in the Last Year: Not on file  . Ran Out of Food in the Last Year: Not on file  Transportation Needs:   . Lack of Transportation (Medical): Not on file  . Lack of Transportation (Non-Medical): Not on file  Physical Activity:   . Days of Exercise per Week: Not on file  . Minutes of Exercise per Session: Not on file  Stress:   . Feeling of Stress : Not on file  Social Connections:   . Frequency of Communication with Friends and Family: Not on file  . Frequency of Social Gatherings with Friends and Family: Not on file  .  Attends Religious Services: Not on file  . Active Member of Clubs or Organizations: Not on file  . Attends Archivist Meetings: Not on file  . Marital Status: Not on file  Intimate Partner Violence:   . Fear of Current or Ex-Partner: Not on file  . Emotionally Abused: Not on file  . Physically Abused: Not on file  . Sexually Abused: Not on file    Outpatient Medications Prior to Visit  Medication Sig Dispense Refill  . albuterol (PROVENTIL HFA;VENTOLIN HFA) 108 (90 Base) MCG/ACT inhaler Inhale 2 puffs into the lungs every 4 (four) hours as needed for wheezing or shortness of breath (cough, shortness of breath or wheezing.). 1 Inhaler 6  .  amLODipine (NORVASC) 10 MG tablet Take 1 tablet (10 mg total) by mouth daily. 30 tablet 11  . atorvastatin (LIPITOR) 40 MG tablet TAKE 1 TABLET BY MOUTH DAILY AT 6 PM. 90 tablet 1  . cetirizine (ZYRTEC) 10 MG tablet TAKE 1 TABLET BY MOUTH EVERY DAY 90 tablet 3  . cimetidine (TAGAMET) 200 MG tablet Take 1 tablet (200 mg total) by mouth 2 (two) times daily. 60 tablet 2  . clopidogrel (PLAVIX) 75 MG tablet TAKE 1 TABLET BY MOUTH EVERY DAY 30 tablet 1  . [facility] B-12 500 MCG tablet TAKE 1 TABLET BY MOUTH DAILY 30 tablet 1  . dolutegravir (TIVICAY) 50 MG tablet Take 1 tablet (50 mg total) by mouth daily. 30 tablet 0  . emtricitabine-tenofovir AF (DESCOVY) 200-25 MG tablet Take 1 tablet by mouth daily. 30 tablet 0  . fluticasone (FLONASE) 50 MCG/ACT nasal spray SPRAY 2 SPRAYS INTO EACH NOSTRIL EVERY DAY 48 mL 2  . gabapentin (NEURONTIN) 400 MG capsule Take 1 capsule (400 mg total) by mouth 3 (three) times daily. Start taking 1 tab daily x 7d, then twice a day x 7d, then can do TID (Patient not taking: Reported on 09/07/2019) 90 capsule 5  . hydrochlorothiazide (HYDRODIURIL) 25 MG tablet TAKE 1 TABLET BY MOUTH EVERY DAY 90 tablet 5  . PARoxetine (PAXIL) 30 MG tablet TAKE 1 TABLET BY MOUTH EVERY DAY (Patient taking differently: Take 30 mg by mouth daily. ) 90 tablet 4  . traZODone (DESYREL) 50 MG tablet TAKE 1/2 TABLETS BY MOUTH AT BEDTIME AS NEEDED FOR SLEEP. 30 tablet 3  . Vitamin D, Ergocalciferol, (DRISDOL) 1.25 MG (50000 UNIT) CAPS capsule TAKE 1 CAPSULE BY MOUTH ONE TIME PER WEEK 8 capsule 1  . metoprolol succinate (TOPROL XL) 25 MG 24 hr tablet Take 1 tablet (25 mg total) by mouth daily. (Patient not taking: Reported on 08/09/2020) 90 tablet 3   No facility-administered medications prior to visit.    Allergies  Allergen Reactions  . Lisinopril Swelling    ROS Review of Systems  Constitutional: Negative.   HENT: Negative.   Respiratory: Negative.   Cardiovascular: Negative.        Ankle  swelling   Gastrointestinal: Negative.   Endocrine: Negative.   Genitourinary: Negative.   Musculoskeletal: Positive for back pain.       Occasional stiffness  Skin: Negative.   Allergic/Immunologic: Negative.       Objective:    Physical Exam Vitals reviewed.  Constitutional:      Appearance: She is normal weight.     Comments: Thinning hair  HENT:     Head: Normocephalic and atraumatic.     Nose: Nose normal.     Mouth/Throat:     Mouth: Mucous membranes are moist.  Cardiovascular:     Rate and Rhythm: Normal rate and regular rhythm.     Pulses: Normal pulses.     Heart sounds: Normal heart sounds.  Pulmonary:     Effort: Pulmonary effort is normal.     Breath sounds: Normal breath sounds.  Abdominal:     General: Bowel sounds are normal.     Palpations: Abdomen is soft.     Tenderness: There is no right CVA tenderness or left CVA tenderness.  Musculoskeletal:        General: Normal range of motion.     Cervical back: Normal range of motion.     Right lower leg: No edema.     Left lower leg: No edema.  Skin:    General: Skin is warm and dry.     Capillary Refill: Capillary refill takes less than 2 seconds.  Neurological:     Mental Status: She is alert and oriented to person, place, and time.  Psychiatric:        Mood and Affect: Mood normal.        Behavior: Behavior normal.        Thought Content: Thought content normal.        Judgment: Judgment normal.     BP 122/88   Pulse 87   Temp (!) 97.4 F (36.3 C)   Resp 20   Ht 5\' 3"  (1.6 m)   Wt 161 lb 12.8 oz (73.4 kg)   LMP 08/04/2014   SpO2 100%   BMI 28.66 kg/m  Wt Readings from Last 3 Encounters:  08/09/20 161 lb 12.8 oz (73.4 kg)  02/02/20 171 lb (77.6 kg)  01/19/20 172 lb (78 kg)     There are no preventive care reminders to display for this patient.  There are no preventive care reminders to display for this patient.  Lab Results  Component Value Date   TSH 3.152 03/28/2019   Lab  Results  Component Value Date   WBC 3.4 (L) 02/02/2020   HGB 15.0 02/02/2020   HCT 42.5 02/02/2020   MCV 92.8 02/02/2020   PLT 179 02/02/2020   Lab Results  Component Value Date   NA 140 02/02/2020   K 3.5 02/02/2020   CO2 28 02/02/2020   GLUCOSE 103 (H) 02/02/2020   BUN 15 02/02/2020   CREATININE 0.91 02/02/2020   BILITOT 0.6 02/02/2020   ALKPHOS 77 03/27/2019   AST 14 02/02/2020   ALT 16 02/02/2020   PROT 6.5 02/02/2020   ALBUMIN 4.0 03/27/2019   CALCIUM 9.1 02/02/2020   ANIONGAP 10 03/28/2019   Lab Results  Component Value Date   CHOL 132 02/02/2020   Lab Results  Component Value Date   HDL 43 (L) 02/02/2020   Lab Results  Component Value Date   LDLCALC 69 02/02/2020   Lab Results  Component Value Date   TRIG 115 02/02/2020   Lab Results  Component Value Date   CHOLHDL 3.1 02/02/2020   Lab Results  Component Value Date   HGBA1C 5.2 03/28/2019      Assessment & Plan:   Problem List Items Addressed This Visit      Cardiovascular and Mediastinum   Essential hypertension - Primary Encouraged on going compliance with current medication regimen Encouraged home monitoring and recording BP <130/80 Eating a heart-healthy diet with less salt Encouraged regular physical activity  Recommend Weight loss    Relevant Orders   Comp. Metabolic Panel (12)     Other  Vitamin D deficiency   HIV (human immunodeficiency virus infection) ([place]) (Chronic) Continue with current regimen.  Follow-up with infectious disease as scheduled    HLD (hyperlipidemia) Continue with current regimen.  No changes warranted. Good patient compliance.    Relevant Orders   Lipid panel    Other Visit Diagnoses    Gastroesophageal reflux disease without esophagitis          No orders of the defined types were placed in this encounter.   Follow-up: Return in about 6 months (around 02/09/2021).    [name], NP

## 2020-08-09 NOTE — Patient Instructions (Signed)
Healthy Eating Following a healthy eating pattern may help you to achieve and maintain a healthy body weight, reduce the risk of chronic disease, and live a long and productive life. It is important to follow a healthy eating pattern at an appropriate calorie level for your body. Your nutritional needs should be met primarily through food by choosing a variety of nutrient-rich foods. What are tips for following this plan? Reading food labels  Read labels and choose the following: ? Reduced or low sodium. ? Juices with 100% fruit juice. ? Foods with low saturated fats and high polyunsaturated and monounsaturated fats. ? Foods with whole grains, such as whole wheat, cracked wheat, brown rice, and wild rice. ? Whole grains that are fortified with folic acid. This is recommended for women who are pregnant or who want to become pregnant.  Read labels and avoid the following: ? Foods with a lot of added sugars. These include foods that contain brown sugar, corn sweetener, corn syrup, dextrose, fructose, glucose, high-fructose corn syrup, honey, invert sugar, lactose, malt syrup, maltose, molasses, raw sugar, sucrose, trehalose, or turbinado sugar.  Do not eat more than the following amounts of added sugar per day:  6 teaspoons (25 g) for women.  9 teaspoons (38 g) for men. ? Foods that contain processed or refined starches and grains. ? Refined grain products, such as white flour, degermed cornmeal, white bread, and white rice. Shopping  Choose nutrient-rich snacks, such as vegetables, whole fruits, and nuts. Avoid high-calorie and high-sugar snacks, such as potato chips, fruit snacks, and candy.  Use oil-based dressings and spreads on foods instead of solid fats such as butter, stick margarine, or cream cheese.  Limit pre-made sauces, mixes, and "instant" products such as flavored rice, instant noodles, and ready-made pasta.  Try more plant-protein sources, such as tofu, tempeh, black beans,  edamame, lentils, nuts, and seeds.  Explore eating plans such as the Mediterranean diet or vegetarian diet. Cooking  Use oil to saut or stir-fry foods instead of solid fats such as butter, stick margarine, or lard.  Try baking, boiling, grilling, or broiling instead of frying.  Remove the fatty part of meats before cooking.  Steam vegetables in water or broth. Meal planning   At meals, imagine dividing your plate into fourths: ? One-half of your plate is fruits and vegetables. ? One-fourth of your plate is whole grains. ? One-fourth of your plate is protein, especially lean meats, poultry, eggs, tofu, beans, or nuts.  Include low-fat dairy as part of your daily diet. Lifestyle  Choose healthy options in all settings, including home, work, school, restaurants, or stores.  Prepare your food safely: ? Wash your hands after handling raw meats. ? Keep food preparation surfaces clean by regularly washing with hot, soapy water. ? Keep raw meats separate from ready-to-eat foods, such as fruits and vegetables. ? Cook seafood, meat, poultry, and eggs to the recommended internal temperature. ? Store foods at safe temperatures. In general:  Keep cold foods at 59F (4.4C) or below.  Keep hot foods at 159F (60C) or above.  Keep your freezer at [facility] (-17.8C) or below.  Foods are no longer safe to eat when they have been between the temperatures of 40-159F (4.4-60C) for more than 2 hours. What foods should I eat? Fruits Aim to eat 2 cup-equivalents of fresh, canned (in natural juice), or frozen fruits each day. Examples of 1 cup-equivalent of fruit include 1 small apple, 8 large strawberries, 1 cup canned fruit,  cup  dried fruit, or 1 cup 100% juice. Vegetables Aim to eat 2-3 cup-equivalents of fresh and frozen vegetables each day, including different varieties and colors. Examples of 1 cup-equivalent of vegetables include 2 medium carrots, 2 cups raw, leafy greens, 1 cup chopped  vegetable (raw or cooked), or 1 medium baked potato. Grains Aim to eat 6 ounce-equivalents of whole grains each day. Examples of 1 ounce-equivalent of grains include 1 slice of bread, 1 cup ready-to-eat cereal, 3 cups popcorn, or  cup cooked rice, pasta, or cereal. Meats and other proteins Aim to eat 5-6 ounce-equivalents of protein each day. Examples of 1 ounce-equivalent of protein include 1 egg, 1/2 cup nuts or seeds, or 1 tablespoon (16 g) peanut butter. A cut of meat or fish that is the size of a deck of cards is about 3-4 ounce-equivalents.  Of the protein you eat each week, try to have at least 8 ounces come from seafood. This includes salmon, trout, herring, and anchovies. Dairy Aim to eat 3 cup-equivalents of fat-free or low-fat dairy each day. Examples of 1 cup-equivalent of dairy include 1 cup (240 mL) milk, 8 ounces (250 g) yogurt, 1 ounces (44 g) natural cheese, or 1 cup (240 mL) fortified soy milk. Fats and oils  Aim for about 5 teaspoons (21 g) per day. Choose monounsaturated fats, such as canola and olive oils, avocados, peanut butter, and most nuts, or polyunsaturated fats, such as sunflower, corn, and soybean oils, walnuts, pine nuts, sesame seeds, sunflower seeds, and flaxseed. Beverages  Aim for six 8-oz glasses of water per day. Limit coffee to three to five 8-oz cups per day.  Limit caffeinated beverages that have added calories, such as soda and energy drinks.  Limit alcohol intake to no more than 1 drink a day for nonpregnant women and 2 drinks a day for men. One drink equals 12 oz of beer (355 mL), 5 oz of wine (148 mL), or 1 oz of hard liquor (44 mL). Seasoning and other foods  Avoid adding excess amounts of salt to your foods. Try flavoring foods with herbs and spices instead of salt.  Avoid adding sugar to foods.  Try using oil-based dressings, sauces, and spreads instead of solid fats. This information is based on general [place] nutrition guidelines. For more  information, visit [employer]. Exact amounts may vary based on your nutrition needs. Summary  A healthy eating plan may help you to maintain a healthy weight, reduce the risk of chronic diseases, and stay active throughout your life.  Plan your meals. Make sure you eat the right portions of a variety of nutrient-rich foods.  Try baking, boiling, grilling, or broiling instead of frying.  Choose healthy options in all settings, including home, work, school, restaurants, or stores. This information is not intended to replace advice given to you by your health care provider. Make sure you discuss any questions you have with your health care provider. Document Revised: 03/16/2018 Document Reviewed: 03/16/2018 Elsevier Patient Education  [place].

## 2020-08-10 LAB — COMP. METABOLIC PANEL (12)
AST: 23 IU/L (ref 0–40)
Albumin/Globulin Ratio: 1.5 (ref 1.2–2.2)
Albumin: 4.3 g/dL (ref 3.8–4.9)
Alkaline Phosphatase: 90 IU/L (ref 48–121)
BUN/Creatinine Ratio: 12 (ref 9–23)
BUN: 11 mg/dL (ref 6–24)
Bilirubin Total: 0.8 mg/dL (ref 0.0–1.2)
Calcium: 9.3 mg/dL (ref 8.7–10.2)
Chloride: 103 mmol/L (ref 96–106)
Creatinine, Ser: 0.91 mg/dL (ref 0.57–1.00)
GFR calc Af Amer: 82 mL/min/{1.73_m2} (ref >59)
GFR calc non Af Amer: 71 mL/min/{1.73_m2} (ref >59)
Globulin, Total: 2.9 g/dL (ref 1.5–4.5)
Glucose: 91 mg/dL (ref 65–99)
Potassium: 3.3 mmol/L — ABNORMAL LOW (ref 3.5–5.2)
Sodium: 144 mmol/L (ref 134–144)
Total Protein: 7.2 g/dL (ref 6.0–8.5)

## 2020-08-10 LAB — LIPID PANEL
Chol/HDL Ratio: 2.3 ratio (ref 0.0–4.4)
Cholesterol, Total: 146 mg/dL (ref 100–199)
HDL: 63 mg/dL (ref >39)
LDL Chol Calc (NIH): 69 mg/dL (ref 0–99)
Triglycerides: 72 mg/dL (ref 0–149)
VLDL Cholesterol Cal: 14 mg/dL (ref 5–40)

## 2020-08-14 ENCOUNTER — Other Ambulatory Visit: Payer: Self-pay | Admitting: Internal Medicine

## 2020-08-14 ENCOUNTER — Other Ambulatory Visit: Payer: Self-pay

## 2020-08-14 DIAGNOSIS — B20 Human immunodeficiency virus [HIV] disease: Secondary | ICD-10-CM

## 2020-08-14 MED ORDER — DESCOVY 200-25 MG PO TABS: 1.0000 | ORAL_TABLET | Freq: Every day | ORAL | 1 refills | Status: DC

## 2020-08-14 MED ORDER — TIVICAY 50 MG PO TABS: 50.0000 mg | ORAL_TABLET | Freq: Every day | ORAL | 1 refills | Status: DC

## 2020-08-14 NOTE — Progress Notes (Signed)
Carelink Summary Report / Loop Recorder 

## 2020-08-15 ENCOUNTER — Other Ambulatory Visit: Payer: Self-pay | Admitting: Nurse Practitioner

## 2020-08-15 ENCOUNTER — Telehealth: Payer: Self-pay | Admitting: Nurse Practitioner

## 2020-08-15 MED ORDER — CLOPIDOGREL BISULFATE 75 MG PO TABS: 75.0000 mg | ORAL_TABLET | Freq: Every day | ORAL | 3 refills | Status: AC

## 2020-08-15 MED ORDER — CYANOCOBALAMIN 500 MCG PO TABS: 500.0000 ug | ORAL_TABLET | Freq: Every day | ORAL | 3 refills | Status: DC

## 2020-08-15 NOTE — Telephone Encounter (Signed)
sent 

## 2020-08-17 ENCOUNTER — Telehealth: Payer: Self-pay | Admitting: *Deleted

## 2020-08-17 NOTE — Telephone Encounter (Signed)
LINQ alert received for 2 "pause" episodes, no ECGs available.  Spoke with pt to request manual LINQ transmission for review of any untransmitted episodes. Pt was out of town mid-August, correlating with period of disconnected monitor per [facility]. She agrees to send transmission. Will call back if any true arrhythmias.

## 2020-08-18 NOTE — Telephone Encounter (Signed)
Transmission received 08/18/11. ILR implanted for CVA.  Appears undersensing.? Routing to Dr. Curt Bears for review.

## 2020-08-19 ENCOUNTER — Other Ambulatory Visit: Payer: Self-pay | Admitting: Internal Medicine

## 2020-08-19 DIAGNOSIS — G4700 Insomnia, unspecified: Secondary | ICD-10-CM

## 2020-08-22 NOTE — Telephone Encounter (Signed)
Agree with undersensing, would continue to monitor.

## 2020-08-30 ENCOUNTER — Telehealth: Payer: Self-pay | Admitting: Emergency Medicine

## 2020-08-30 NOTE — Telephone Encounter (Signed)
Patent will send a remote transmission in order to see all alerts recorded.

## 2020-08-31 ENCOUNTER — Other Ambulatory Visit: Payer: Self-pay | Admitting: Infectious Diseases

## 2020-09-04 NOTE — Telephone Encounter (Signed)
Manual transmission received 08/30/20. 1 nocturnal pause noted 08/21/20 at 02:25, 4 sec duration.  4 other pause episodes are false, ECGs exhibit undersensing/signal dropout.  Plan to continue monitoring remotely via [facility].

## 2020-09-09 ENCOUNTER — Other Ambulatory Visit: Payer: Self-pay | Admitting: Internal Medicine

## 2020-09-09 DIAGNOSIS — B20 Human immunodeficiency virus [HIV] disease: Secondary | ICD-10-CM

## 2020-09-11 ENCOUNTER — Ambulatory Visit: Payer: Medicare Other | Admitting: Internal Medicine

## 2020-09-11 ENCOUNTER — Ambulatory Visit (INDEPENDENT_AMBULATORY_CARE_PROVIDER_SITE_OTHER): Payer: Medicare Other | Admitting: Emergency Medicine

## 2020-09-11 DIAGNOSIS — I63132 Cerebral infarction due to embolism of left carotid artery: Secondary | ICD-10-CM

## 2020-09-11 LAB — CUP PACEART REMOTE DEVICE CHECK
Date Time Interrogation Session: 20210919002944
Implantable Pulse Generator Implant Date: 20200413

## 2020-09-13 NOTE — Progress Notes (Signed)
Carelink Summary Report / Loop Recorder 

## 2020-09-20 ENCOUNTER — Ambulatory Visit: Payer: Medicare Other | Admitting: Internal Medicine

## 2020-09-25 ENCOUNTER — Telehealth (INDEPENDENT_AMBULATORY_CARE_PROVIDER_SITE_OTHER): Payer: Medicare Other | Admitting: Nurse Practitioner

## 2020-09-25 DIAGNOSIS — R634 Abnormal weight loss: Secondary | ICD-10-CM | POA: Diagnosis not present

## 2020-09-25 DIAGNOSIS — Z8639 Personal history of other endocrine, nutritional and metabolic disease: Secondary | ICD-10-CM | POA: Diagnosis not present

## 2020-09-25 DIAGNOSIS — R42 Dizziness and giddiness: Secondary | ICD-10-CM | POA: Diagnosis not present

## 2020-09-25 NOTE — Progress Notes (Signed)
   [place],   [address] Phone:  [phone]   Fax:  [phone] Virtual Visit via Telephone Note  I connected with Abigail Medina on 09/25/20 at  2:40 PM EDT by telephone and verified that I am speaking with the correct person using two identifiers.   I discussed the limitations, risks, security and privacy concerns of performing an evaluation and management service by telephone and the availability of in person appointments. I also discussed with the patient that there may be a patient responsible charge related to this service. The patient expressed understanding and agreed to proceed.  Patient home Provider Office  History of Present Illness:   funny felling unsure if diizzy, On Satudray she left out an  Right hand numbness.  She just returned from [place] today She is unsure what She has not fallen but she did bump in to a door Vertigo Patient presents for evaluation of dizziness. The symptoms started a week ago and have been intermittent. The attacks occur intermittently and last a few minutes. Positions that worsen symptoms: none. Previous workup/treatments: none. Associated ear symptoms: tinnitus. Associated CNS symptoms: paresthesias; right hand only. 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  Recent infections: none. Head trauma: denied. She has not fallen but has stumbled into the wall once. Drug ingestion: none. She deneis any change in medication or new medication . She has had an intentional 6 pound wt loss. This is in part to her role in the wedding. She was on the cabbage soup diet and slim fast for 1-2 weeks. Noise exposure: she admits that she was in the room with her grandchil on one occasion. 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  She admits that she had similar symptoms when she had a stroke last year. She has not been checking her BP.   Observations/Objective: No exam due  to televisit. Pt able to carryon phone interview without difficulty. She was speaking in full sentences.   Assessment and Plan: Assessment  Primary Diagnosis & Pertinent Problem List: The primary encounter diagnosis was Dizziness. Diagnoses of Abnormal intentional weight loss and Hx of hypokalemia were also pertinent to this visit.  Visit Diagnosis: 1. Dizziness  If symptoms progress or get worse to the ER to rule out TIA, stroke or dehydration  2. Abnormal intentional weight loss  Encouraged pt to eat and drink as tolerated  3. Hx of hypokalemia  STAT labs in am, maybe dehydration that is causing symptoms     Follow Up Instructions: As scheduled   I discussed the assessment and treatment plan with the patient. The patient was provided an opportunity to ask questions and all were answered. The patient agreed with the plan and demonstrated an understanding of the instructions.   The patient was advised to call back or seek an in-person evaluation if the symptoms worsen or if the condition fails to improve as anticipated.  I provided 12 minutes of non-face-to-face time during this encounter.   [name], NP

## 2020-09-26 ENCOUNTER — Other Ambulatory Visit: Payer: Self-pay

## 2020-09-26 ENCOUNTER — Other Ambulatory Visit: Payer: Self-pay | Admitting: Nurse Practitioner

## 2020-09-26 ENCOUNTER — Other Ambulatory Visit: Payer: Medicare Other

## 2020-09-26 ENCOUNTER — Other Ambulatory Visit: Payer: Self-pay | Admitting: Infectious Diseases

## 2020-09-26 ENCOUNTER — Encounter: Payer: Self-pay | Admitting: Nurse Practitioner

## 2020-09-26 DIAGNOSIS — Z8639 Personal history of other endocrine, nutritional and metabolic disease: Secondary | ICD-10-CM

## 2020-09-26 DIAGNOSIS — R42 Dizziness and giddiness: Secondary | ICD-10-CM | POA: Diagnosis not present

## 2020-09-26 DIAGNOSIS — R634 Abnormal weight loss: Secondary | ICD-10-CM

## 2020-09-26 LAB — CBC WITH DIFFERENTIAL/PLATELET
Basophils Absolute: 0 10*3/uL (ref 0.0–0.2)
Basos: 0 %
EOS (ABSOLUTE): 0.2 10*3/uL (ref 0.0–0.4)
Eos: 5 %
Hematocrit: 44.5 % (ref 34.0–46.6)
Hemoglobin: 15 g/dL (ref 11.1–15.9)
Lymphocytes Absolute: 1.5 10*3/uL (ref 0.7–3.1)
Lymphs: 47 %
MCH: 30.7 pg (ref 26.6–33.0)
MCHC: 33.7 g/dL (ref 31.5–35.7)
MCV: 91 fL (ref 79–97)
Monocytes Absolute: 0.3 10*3/uL (ref 0.1–0.9)
Monocytes: 10 %
Neutrophils Absolute: 1.3 10*3/uL — ABNORMAL LOW (ref 1.4–7.0)
Neutrophils: 38 %
Platelets: 201 10*3/uL (ref 150–450)
RBC: 4.89 x10E6/uL (ref 3.77–5.28)
RDW: 13.7 % (ref 11.7–15.4)
WBC: 3.2 10*3/uL — ABNORMAL LOW (ref 3.4–10.8)

## 2020-09-26 LAB — COMP. METABOLIC PANEL (12)
AST: 21 IU/L (ref 0–40)
Albumin/Globulin Ratio: 1.7 (ref 1.2–2.2)
Albumin: 4 g/dL (ref 3.8–4.9)
Alkaline Phosphatase: 89 IU/L (ref 44–121)
BUN/Creatinine Ratio: 17 (ref 9–23)
BUN: 13 mg/dL (ref 6–24)
Bilirubin Total: 0.9 mg/dL (ref 0.0–1.2)
Calcium: 9.2 mg/dL (ref 8.7–10.2)
Chloride: 104 mmol/L (ref 96–106)
Creatinine, Ser: 0.76 mg/dL (ref 0.57–1.00)
GFR calc Af Amer: 101 mL/min/{1.73_m2} (ref >59)
GFR calc non Af Amer: 88 mL/min/{1.73_m2} (ref >59)
Globulin, Total: 2.4 g/dL (ref 1.5–4.5)
Glucose: 100 mg/dL — ABNORMAL HIGH (ref 65–99)
Potassium: 3.1 mmol/L — ABNORMAL LOW (ref 3.5–5.2)
Sodium: 141 mmol/L (ref 134–144)
Total Protein: 6.4 g/dL (ref 6.0–8.5)

## 2020-09-26 LAB — MAGNESIUM: Magnesium: 1.8 mg/dL (ref 1.6–2.3)

## 2020-09-26 MED ORDER — POTASSIUM CHLORIDE CRYS ER 20 MEQ PO TBCR: 20.0000 meq | EXTENDED_RELEASE_TABLET | Freq: Two times a day (BID) | ORAL | 0 refills | Status: DC

## 2020-09-26 NOTE — Addendum Note (Signed)
Addended by: [name] on: 09/26/2020 08:05 AM   Modules accepted: Orders

## 2020-09-27 ENCOUNTER — Other Ambulatory Visit: Payer: Self-pay

## 2020-09-27 LAB — MAGNESIUM: Magnesium: 2 mg/dL (ref 1.6–2.3)

## 2020-09-27 MED ORDER — ATORVASTATIN CALCIUM 40 MG PO TABS: ORAL_TABLET | ORAL | 1 refills | Status: DC

## 2020-10-03 ENCOUNTER — Telehealth: Payer: Self-pay

## 2020-10-03 NOTE — Telephone Encounter (Signed)
I called pt for a manual transmission per [employer].  ILR pause alert, no details received with transmission for review. Will need manual transmission. Routing for retrieval of remote for further review.

## 2020-10-04 ENCOUNTER — Ambulatory Visit (INDEPENDENT_AMBULATORY_CARE_PROVIDER_SITE_OTHER): Payer: Medicare Other

## 2020-10-04 DIAGNOSIS — I63132 Cerebral infarction due to embolism of left carotid artery: Secondary | ICD-10-CM | POA: Diagnosis not present

## 2020-10-04 LAB — CUP PACEART REMOTE DEVICE CHECK
Date Time Interrogation Session: 20211020021249
Implantable Pulse Generator Implant Date: 20200413

## 2020-10-04 NOTE — Telephone Encounter (Signed)
Transmission received 10-03-2020.

## 2020-10-05 NOTE — Telephone Encounter (Signed)
Transmission received.   Appears the device was undersensing both episodes as noted below. Continue to monitor.

## 2020-10-06 ENCOUNTER — Other Ambulatory Visit: Payer: Self-pay | Admitting: Internal Medicine

## 2020-10-06 ENCOUNTER — Other Ambulatory Visit: Payer: Self-pay

## 2020-10-06 DIAGNOSIS — B20 Human immunodeficiency virus [HIV] disease: Secondary | ICD-10-CM

## 2020-10-06 MED ORDER — TIVICAY 50 MG PO TABS: 50.0000 mg | ORAL_TABLET | Freq: Every day | ORAL | 0 refills | Status: DC

## 2020-10-06 MED ORDER — DESCOVY 200-25 MG PO TABS: 1.0000 | ORAL_TABLET | Freq: Every day | ORAL | 0 refills | Status: DC

## 2020-10-10 NOTE — Progress Notes (Signed)
Carelink Summary Report / Loop Recorder 

## 2020-10-18 ENCOUNTER — Ambulatory Visit (INDEPENDENT_AMBULATORY_CARE_PROVIDER_SITE_OTHER): Payer: Medicare Other | Admitting: Internal Medicine

## 2020-10-18 ENCOUNTER — Other Ambulatory Visit: Payer: Self-pay | Admitting: Internal Medicine

## 2020-10-18 ENCOUNTER — Other Ambulatory Visit (HOSPITAL_COMMUNITY)
Admission: RE | Admit: 2020-10-18 | Discharge: 2020-10-18 | Disposition: A | Discharge status: Home / Self Care | Payer: Medicare Other | Source: Ambulatory Visit | Attending: Internal Medicine | Admitting: Internal Medicine

## 2020-10-18 ENCOUNTER — Other Ambulatory Visit: Payer: Self-pay

## 2020-10-18 ENCOUNTER — Encounter: Payer: Self-pay | Admitting: Internal Medicine

## 2020-10-18 VITALS — BP 120/88 | HR 80 | Temp 97.6°F | Ht 63.0 in | Wt 159.0 lb

## 2020-10-18 DIAGNOSIS — Z23 Encounter for immunization: Secondary | ICD-10-CM

## 2020-10-18 DIAGNOSIS — I10 Essential (primary) hypertension: Secondary | ICD-10-CM

## 2020-10-18 DIAGNOSIS — W461XXA Contact with contaminated hypodermic needle, initial encounter: Secondary | ICD-10-CM

## 2020-10-18 DIAGNOSIS — Z7721 Contact with and (suspected) exposure to potentially hazardous body fluids: Secondary | ICD-10-CM

## 2020-10-18 DIAGNOSIS — B20 Human immunodeficiency virus [HIV] disease: Secondary | ICD-10-CM | POA: Insufficient documentation

## 2020-10-18 DIAGNOSIS — Z202 Contact with and (suspected) exposure to infections with a predominantly sexual mode of transmission: Secondary | ICD-10-CM

## 2020-10-18 MED ORDER — CEFTRIAXONE SODIUM 500 MG IJ SOLR
500.0000 mg | Freq: Once | INTRAMUSCULAR | Status: AC
  Administered 2020-10-18: 500 mg via INTRAMUSCULAR

## 2020-10-18 MED ORDER — AZITHROMYCIN 250 MG PO TABS
1000.0000 mg | ORAL_TABLET | Freq: Once | ORAL | Status: AC
  Administered 2020-10-18: 1000 mg via ORAL

## 2020-10-18 MED ORDER — METRONIDAZOLE 500 MG PO TABS: 500.0000 mg | ORAL_TABLET | Freq: Two times a day (BID) | ORAL | 0 refills | Status: DC

## 2020-10-18 NOTE — Progress Notes (Signed)
Patient tolerated ceftriaxone and azithromycin well. Reinforced abstinence for 10 days after treatment, offered condoms and encouraged use. Patient verbalized understanding.

## 2020-10-18 NOTE — Progress Notes (Signed)
RFV: follow up for hiv disease  Patient ID: Abigail Medina, female   DOB: 1966-01-17, 54 y.o.   MRN: [id number]  HPI Abigail Medina is a 54yo F with hiv disease, well controlled on tivicay/descovy. Has excellent adherence.   Has been starting on potassium supplement  His significant other stepped out on her, and had to be treated for sti but she is unsure what she was sure what he had discharge.  Outpatient Encounter Medications as of 10/18/2020  Medication Sig  . albuterol (PROVENTIL HFA;VENTOLIN HFA) 108 (90 Base) MCG/ACT inhaler Inhale 2 puffs into the lungs every 4 (four) hours as needed for wheezing or shortness of breath (cough, shortness of breath or wheezing.).  Marland Kitchen amLODipine (NORVASC) 10 MG tablet Take 1 tablet (10 mg total) by mouth daily.  Marland Kitchen atorvastatin (LIPITOR) 40 MG tablet TAKE 1 TABLET BY MOUTH DAILY AT 6 PM.  . cetirizine (ZYRTEC) 10 MG tablet TAKE 1 TABLET BY MOUTH EVERY DAY  . cimetidine (TAGAMET) 200 MG tablet Take 1 tablet (200 mg total) by mouth 2 (two) times daily.  . clopidogrel (PLAVIX) 75 MG tablet Take 1 tablet (75 mg total) by mouth daily.  . dolutegravir (TIVICAY) 50 MG tablet Take 1 tablet (50 mg total) by mouth daily.  Marland Kitchen emtricitabine-tenofovir AF (DESCOVY) 200-25 MG tablet Take 1 tablet by mouth daily.  . fluticasone (FLONASE) 50 MCG/ACT nasal spray SPRAY 2 SPRAYS INTO EACH NOSTRIL EVERY DAY  . gabapentin (NEURONTIN) 400 MG capsule Take 1 capsule (400 mg total) by mouth 3 (three) times daily. Start taking 1 tab daily x 7d, then twice a day x 7d, then can do TID (Patient not taking: Reported on 09/07/2019)  . hydrochlorothiazide (HYDRODIURIL) 25 MG tablet TAKE 1 TABLET BY MOUTH EVERY DAY  . PARoxetine (PAXIL) 30 MG tablet TAKE 1 TABLET BY MOUTH EVERY DAY (Patient taking differently: Take 30 mg by mouth daily. )  . potassium chloride SA (KLOR-CON) 20 MEQ tablet Take 1 tablet (20 mEq total) by mouth 2 (two) times daily for 14 days.  . traZODone (DESYREL) 50 MG  tablet TAKE 1/2 TABLETS BY MOUTH AT BEDTIME AS NEEDED FOR SLEEP.  Marland Kitchen vitamin B-12 ([employer] B-12) 500 MCG tablet Take 1 tablet (500 mcg total) by mouth daily.  . Vitamin D, Ergocalciferol, (DRISDOL) 1.25 MG (50000 UNIT) CAPS capsule TAKE 1 CAPSULE BY MOUTH ONE TIME PER WEEK   No facility-administered encounter medications on file as of 10/18/2020.     Patient Active Problem List   Diagnosis Date Noted  . Healthcare maintenance 04/13/2019  . Aneurysm of thoracic aorta ([place]) 03/29/2019  . Cerebral thrombosis with cerebral infarction 03/28/2019  . CVA (cerebral vascular accident) ([place]) 03/28/2019  . Acute focal neurological deficit 03/27/2019  . Hypokalemia 03/27/2019  . Hyperbilirubinemia 03/27/2019  . History of CVA with residual deficit   . Pain of back and left lower extremity 12/15/2018  . Upper respiratory infection, viral 12/15/2018  . 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 Tobacco dependence 06/02/2015  . Numbness and tingling of right arm 05/30/2015  . Right arm weakness 05/30/2015  . Arm numbness left 10/10/2014  . Vitamin D deficiency 08/05/2014  . Allergic rhinitis 05/25/2013  . 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 Polypharmacy 08/20/2012  . HIV (human immunodeficiency virus infection) ([place]) 08/20/2012  . HLD (hyperlipidemia) 07/15/2012  . BP (high blood pressure) 07/15/2012  . Cerebrovascular accident, late effects 01/30/2012  . Depression with anxiety 07/15/2011  . Seborrhea capitis 04/18/2011  . Seborrheic dermatitis 03/07/2011  .  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 VAGINAL DISCHARGE 12/26/2010  . NEVUS 08/29/2010  . AMENORRHEA 07/19/2010  . SKIN RASH 05/21/2010  . THRUSH 12/28/2009  . Human immunodeficiency virus (HIV) disease ([place]) 08/06/2007  . Essential hypertension 08/06/2007     Health Maintenance Due  Topic Date Due  . COVID-19 Vaccine (1) Never done   sochx: no smoking and no drinking  Review of Systems Review of Systems    Constitutional: Negative for fever, chills, diaphoresis, activity change, appetite change, fatigue and unexpected weight change.  HENT: Negative for congestion, sore throat, rhinorrhea, sneezing, trouble swallowing and sinus pressure.  Eyes: Negative for photophobia and visual disturbance.  Respiratory: Negative for cough, chest tightness, shortness of breath, wheezing and stridor.  Cardiovascular: Negative for chest pain, palpitations and leg swelling.  Gastrointestinal: Negative for nausea, vomiting, abdominal pain, diarrhea, constipation, blood in stool, abdominal distention and anal bleeding.  Genitourinary: Negative for dysuria, hematuria, flank pain and difficulty urinating.  Musculoskeletal: Negative for myalgias, back pain, joint swelling, arthralgias and gait problem.  Skin: Negative for color change, pallor, rash and wound.  Neurological: Negative for dizziness, tremors, weakness and light-headedness.  Hematological: Negative for adenopathy. Does not bruise/bleed easily.  Psychiatric/Behavioral: Negative for behavioral problems, confusion, sleep disturbance, dysphoric mood, decreased concentration and agitation.    Physical Exam  BP 120/88   Pulse 80   Temp 97.6 F (36.4 C) (Oral)   Ht 5\' 3"  (1.6 m)   Wt 159 lb (72.1 kg)   LMP 08/04/2014   SpO2 100%   BMI 28.17 kg/m  Physical Exam  Constitutional:  oriented to person, place, and time. appears well-developed and well-nourished. No distress.  HENT: [place]/AT, PERRLA, no scleral icterus Mouth/Throat: Oropharynx is clear and moist. No oropharyngeal exudate.  Cardiovascular: Normal rate, regular rhythm and normal heart sounds. Exam reveals no gallop and no friction rub.  No murmur heard.  Pulmonary/Chest: Effort normal and breath sounds normal. No respiratory distress.  has no wheezes.  Neck = supple, no nuchal rigidity Abdominal: Soft. Bowel sounds are normal.  exhibits no distension. There is no tenderness.  Lymphadenopathy: no  cervical adenopathy. No axillary adenopathy Neurological: alert and oriented to person, place, and time.  Skin: Skin is warm and dry. No rash noted. No erythema.  Psychiatric: a normal mood and affect.  behavior is normal.    Lab Results  Component Value Date   CD4TCELL 39 02/02/2020   Lab Results  Component Value Date   CD4TABS 573 02/02/2020   CD4TABS 496 08/04/2019   CD4TABS 448 04/13/2019   Lab Results  Component Value Date   HIV1RNAQUANT 29 (H) 02/02/2020   Lab Results  Component Value Date   HEPBSAB NEG 03/10/2014   Lab Results  Component Value Date   LABRPR NON-REACTIVE 02/02/2020    CBC Lab Results  Component Value Date   WBC 3.2 (L) 09/26/2020   RBC 4.89 09/26/2020   HGB 15.0 09/26/2020   HCT 44.5 09/26/2020   PLT 201 09/26/2020   MCV 91 09/26/2020   MCH 30.7 09/26/2020   MCHC 33.7 09/26/2020   RDW 13.7 09/26/2020   LYMPHSABS 1.5 09/26/2020   MONOABS 0.4 03/27/2019   EOSABS 0.2 09/26/2020    BMET Lab Results  Component Value Date   NA 141 09/26/2020   K 3.1 (L) 09/26/2020   CL 104 09/26/2020   CO2 28 02/02/2020   GLUCOSE 100 (H) 09/26/2020   BUN 13 09/26/2020   CREATININE 0.76 09/26/2020  CALCIUM 9.2 09/26/2020   GFRNONAA 88 09/26/2020   GFRAA 101 09/26/2020      Assessment and Plan hiv disease = we will plan to get labs today but likely to still be well controlled. Plan to continue on current regimen.  In terms of sti exposure = we will plan to give ceftriaxone plus azithromycin. Her husband had GC (Plus chlam?)   We will give flu shot for health maintenance  Presumed BV = will give treatment with metronidazole 500mg  BID x 7 day

## 2020-10-19 LAB — URINE CYTOLOGY ANCILLARY ONLY
Chlamydia: NEGATIVE
Comment: NEGATIVE
Comment: NORMAL
Neisseria Gonorrhea: POSITIVE — AB

## 2020-10-19 LAB — T-HELPER CELL (CD4) - (RCID CLINIC ONLY)
CD4 % Helper T Cell: 37 % (ref 33–65)
CD4 T Cell Abs: 453 /uL (ref 400–1790)

## 2020-10-21 ENCOUNTER — Other Ambulatory Visit: Payer: Self-pay | Admitting: Infectious Diseases

## 2020-10-21 LAB — CBC WITH DIFFERENTIAL/PLATELET
Absolute Monocytes: 299 cells/uL (ref 200–950)
Basophils Absolute: 20 cells/uL (ref 0–200)
Basophils Relative: 0.6 %
Eosinophils Absolute: 78 cells/uL (ref 15–500)
Eosinophils Relative: 2.3 %
HCT: 43.2 % (ref 35.0–45.0)
Hemoglobin: 14.9 g/dL (ref 11.7–15.5)
Lymphs Abs: 1214 cells/uL (ref 850–3900)
MCH: 30.7 pg (ref 27.0–33.0)
MCHC: 34.5 g/dL (ref 32.0–36.0)
MCV: 89.1 fL (ref 80.0–100.0)
MPV: 11.2 fL (ref 7.5–12.5)
Monocytes Relative: 8.8 %
Neutro Abs: 1788 cells/uL (ref 1500–7800)
Neutrophils Relative %: 52.6 %
Platelets: 175 10*3/uL (ref 140–400)
RBC: 4.85 10*6/uL (ref 3.80–5.10)
RDW: 13.1 % (ref 11.0–15.0)
Total Lymphocyte: 35.7 %
WBC: 3.4 10*3/uL — ABNORMAL LOW (ref 3.8–10.8)

## 2020-10-21 LAB — COMPLETE METABOLIC PANEL WITH GFR
AG Ratio: 1.5 (calc) (ref 1.0–2.5)
ALT: 24 U/L (ref 6–29)
AST: 19 U/L (ref 10–35)
Albumin: 4.3 g/dL (ref 3.6–5.1)
Alkaline phosphatase (APISO): 83 U/L (ref 37–153)
BUN: 11 mg/dL (ref 7–25)
CO2: 29 mmol/L (ref 20–32)
Calcium: 9.4 mg/dL (ref 8.6–10.4)
Chloride: 103 mmol/L (ref 98–110)
Creat: 0.81 mg/dL (ref 0.50–1.05)
GFR, Est African American: 94 mL/min/{1.73_m2} (ref >=60)
GFR, Est Non African American: 81 mL/min/{1.73_m2} (ref >=60)
Globulin: 2.9 g/dL (calc) (ref 1.9–3.7)
Glucose, Bld: 90 mg/dL (ref 65–99)
Potassium: 3.7 mmol/L (ref 3.5–5.3)
Sodium: 141 mmol/L (ref 135–146)
Total Bilirubin: 1.2 mg/dL (ref 0.2–1.2)
Total Protein: 7.2 g/dL (ref 6.1–8.1)

## 2020-10-21 LAB — HIV-1 RNA QUANT-NO REFLEX-BLD
HIV 1 RNA Quant: <20 Copies/mL
HIV-1 RNA Quant, Log: <1.3 Log cps/mL

## 2020-10-21 LAB — RPR: RPR Ser Ql: NONREACTIVE

## 2020-10-27 ENCOUNTER — Telehealth: Payer: Self-pay

## 2020-10-27 NOTE — Telephone Encounter (Signed)
Pause alert no details available. Routing to Triage to evaluate need for manual send..  Pt implanted for CVA.  History of both true and false pauses.  False pauses occur due to undersensing of QRS.  True pauses in past were 3 seconds follwoed by SB.  Therefore need to assess EGMs.    Spoke with pt, she sent manual transmission.  Missing EGM reviewed.  Episode was a pause due to undersensing.

## 2020-10-31 ENCOUNTER — Other Ambulatory Visit: Payer: Self-pay

## 2020-10-31 ENCOUNTER — Other Ambulatory Visit: Payer: Self-pay | Admitting: Internal Medicine

## 2020-10-31 DIAGNOSIS — B20 Human immunodeficiency virus [HIV] disease: Secondary | ICD-10-CM

## 2020-10-31 MED ORDER — DESCOVY 200-25 MG PO TABS: 1.0000 | ORAL_TABLET | Freq: Every day | ORAL | 5 refills | Status: DC

## 2020-10-31 MED ORDER — TIVICAY 50 MG PO TABS: 50.0000 mg | ORAL_TABLET | Freq: Every day | ORAL | 5 refills | Status: DC

## 2020-11-06 ENCOUNTER — Ambulatory Visit (INDEPENDENT_AMBULATORY_CARE_PROVIDER_SITE_OTHER): Payer: Medicare Other

## 2020-11-06 DIAGNOSIS — I63132 Cerebral infarction due to embolism of left carotid artery: Secondary | ICD-10-CM | POA: Diagnosis not present

## 2020-11-06 LAB — CUP PACEART REMOTE DEVICE CHECK
Date Time Interrogation Session: 20211120013222
Implantable Pulse Generator Implant Date: 20200413

## 2020-11-08 NOTE — Progress Notes (Signed)
Carelink Summary Report / Loop Recorder 

## 2020-11-16 ENCOUNTER — Other Ambulatory Visit: Payer: Self-pay | Admitting: Family Medicine

## 2020-11-16 DIAGNOSIS — E559 Vitamin D deficiency, unspecified: Secondary | ICD-10-CM

## 2020-11-21 ENCOUNTER — Other Ambulatory Visit: Payer: Self-pay | Admitting: Infectious Diseases

## 2020-11-21 NOTE — Telephone Encounter (Signed)
Please see refill request.

## 2020-12-01 ENCOUNTER — Ambulatory Visit (HOSPITAL_COMMUNITY)
Admission: RE | Admit: 2020-12-01 | Discharge: 2020-12-01 | Disposition: A | Discharge status: Home / Self Care | Payer: Medicare Other | Source: Ambulatory Visit | Attending: Nurse Practitioner | Admitting: Nurse Practitioner

## 2020-12-01 ENCOUNTER — Telehealth: Payer: Self-pay | Admitting: Emergency Medicine

## 2020-12-01 ENCOUNTER — Other Ambulatory Visit: Payer: Self-pay

## 2020-12-01 ENCOUNTER — Encounter: Payer: Self-pay | Admitting: Nurse Practitioner

## 2020-12-01 ENCOUNTER — Ambulatory Visit (INDEPENDENT_AMBULATORY_CARE_PROVIDER_SITE_OTHER): Payer: Medicare Other | Admitting: Nurse Practitioner

## 2020-12-01 VITALS — BP 133/94 | HR 80 | Temp 97.7°F | Ht 63.0 in | Wt 157.0 lb

## 2020-12-01 DIAGNOSIS — R0781 Pleurodynia: Secondary | ICD-10-CM

## 2020-12-01 MED ORDER — METHYLPREDNISOLONE 4 MG PO TBPK: ORAL_TABLET | ORAL | 0 refills | Status: AC

## 2020-12-01 NOTE — Patient Instructions (Signed)
Rib Contusion A rib contusion is a deep bruise on your rib area. Contusions are the result of a blunt trauma that causes bleeding and injury to the tissues under the skin. A rib contusion may involve bruising of the ribs and of the skin and muscles in the area. The skin over the contusion may turn blue, purple, or yellow. Minor injuries will give you a painless contusion. More severe contusions may stay painful and swollen for a few weeks. What are the causes? This condition is usually caused by a blow, trauma, or direct force to an area of the body. This often occurs while playing contact sports. What are the signs or symptoms? Symptoms of this condition include:  Swelling and redness of the injured area.  Discoloration of the injured area.  Tenderness and soreness of the injured area.  Pain with or without movement. How is this diagnosed? This condition may be diagnosed based on:  Your symptoms and medical history.  A physical exam.  Imaging tests--such as an X-ray, CT scan, or MRI--to determine if there were internal injuries or broken bones (fractures). How is this treated? This condition may be treated with:  Rest. This is often the best treatment for a rib contusion.  Icing. This reduces swelling and inflammation.  Deep-breathing exercises. These may be recommended to reduce the risk for lung collapse and pneumonia.  Medicines. Over-the-counter or prescription medicines may be given to control pain.  Injection of a numbing medicine around the nerve near your injury (nerve block). Follow these instructions at home:     Medicines  Take over-the-counter and prescription medicines only as told by your health care provider.  Do not drive or use heavy machinery while taking prescription pain medicine.  If you are taking prescription pain medicine, take actions to prevent or treat constipation. Your health care provider may recommend that you: ? Drink enough fluid to keep  your urine pale yellow. ? Eat foods that are high in fiber, such as fresh fruits and vegetables, whole grains, and beans. ? Limit foods that are high in fat and processed sugars, such as fried or sweet foods. ? Take an over-the-counter or prescription medicine for constipation. Managing pain, stiffness, and swelling  If directed, put ice on the injured area: ? Put ice in a plastic bag. ? Place a towel between your skin and the bag. ? Leave the ice on for 20 minutes, 2-3 times a day.  Rest the injured area. Avoid strenuous activity and any activities or movements that cause pain. Be careful during activities and avoid bumping the injured area.  Do not lift anything that is heavier than 5 lb (2.3 kg), or the limit that you are told, until your health care provider says that it is safe. General instructions  Do not use any products that contain nicotine or tobacco, such as cigarettes and e-cigarettes. These can delay healing. If you need help quitting, ask your health care provider.  Do deep-breathing exercises as told by your health care provider.  If you were given an incentive spirometer, use it every 1-2 hours while you are awake, or as recommended by your health care provider. This device measures how well you are filling your lungs with each breath.  Keep all follow-up visits as told by your health care provider. This is important. Contact a health care provider if you have:  Increased bruising or swelling.  Pain that is not controlled with treatment.  A fever. Get help right away if   you:  Have difficulty breathing or shortness of breath.  Develop a continual cough or you cough up thick or bloody sputum.  Feel nauseous or you vomit.  Have pain in your abdomen. Summary  A rib contusion is a deep bruise on your rib area. Contusions are the result of a blunt trauma that causes bleeding and injury to the tissues under the skin.  The skin overlying the contusion may turn  blue, purple, or yellow. Minor injuries may give you a painless contusion. More severe contusions may stay painful and swollen for a few weeks.  Rest the injured area. Avoid strenuous activity and any activities or movements that cause pain. This information is not intended to replace advice given to you by your health care provider. Make sure you discuss any questions you have with your health care provider. Document Revised: 12/31/2017 Document Reviewed: 12/31/2017 Elsevier Patient Education  2020 [employer]  

## 2020-12-01 NOTE — Telephone Encounter (Signed)
Opened in error

## 2020-12-01 NOTE — Progress Notes (Signed)
Abigail Medina, [name] Phone:  [phone]   Fax:  [phone]   Acute Office Visit  Subjective:    Patient ID: Abigail Medina, female    DOB: [birth year], 54 y.o.   MRN: [id number]  Chief Complaint  Patient presents with  . Rib Injury    Almost fell two weeks ago prevented complete fall from bracing arm out.  Now having right side rib pain.   Requesting x-ray     HPI Patient is in today for right sided rib pain. She  has a past medical history of HIV (human immunodeficiency virus infection) ([place]), Hypertension, Seizures ([place]), Stroke ([place]), and Vision abnormalities.   Patient is here for evaluation after a fall. Patient reportedly was  fell into her table  when she fell from standing. The accident occurred 2 weeks ago. It is reported that the patient did not have LOC. At this time she complains of right rib pain.  Patient denies headache, visual change, hearing loss, neck pain, chest pain, nausea, vomiting, numbness, tingling, incontinence, inability to ambulate and heel pain. Symptoms are exacerbated by pressure on injured area.She has been unable to sleep. Pre-hospital information: none available. The patient has tried rest, heat and icy hot. for her symptoms with minimal relief.  Past Medical History:  Diagnosis Date  . HIV (human immunodeficiency virus infection) ([place])   . Hypertension   . Seizures ([place])   . Stroke ([place])   . Vision abnormalities     Past Surgical History:  Procedure Laterality Date  . LOOP RECORDER INSERTION N/A 03/29/2019   Procedure: LOOP RECORDER INSERTION;  Surgeon: Constance Haw, MD;  Location: [facility];  Service: Cardiovascular;  Laterality: N/A;    Family History  Problem Relation Age of Onset  . Hypertension Mother     Social History   Socioeconomic History  . Marital status: Single    Spouse name: Not on file  . Number of children: Not on file  . Years of education:  Not on file  . Highest education level: Not on file  Occupational History  . Not on file  Tobacco Use  . Smoking status: Former Smoker    Packs/day: 0.00    Types: Cigarettes    Start date: 10/17/2015    Quit date: 08/10/2019    Years since quitting: 1.3  . Smokeless tobacco: Never Used  Vaping Use  . Vaping Use: Never used  Substance and Sexual Activity  . Alcohol use: Yes    Alcohol/week: 2.0 - 3.0 standard drinks    Types: 2 - 3 Standard drinks or equivalent per week    Comment: liquor or beer; stated has increased with social meetings  . Drug use: No  . Sexual activity: Yes    Partners: Male    Birth control/protection: Condom  Other Topics Concern  . Not on file  Social History Narrative  . Not on file   Social Determinants of Health   Financial Resource Strain: Not on file  Food Insecurity: Not on file  Transportation Needs: Not on file  Physical Activity: Not on file  Stress: Not on file  Social Connections: Not on file  Intimate Partner Violence: Not on file    Outpatient Medications Prior to Visit  Medication Sig Dispense Refill  . albuterol (PROVENTIL HFA;VENTOLIN HFA) 108 (90 Base) MCG/ACT inhaler Inhale 2 puffs into the lungs every 4 (four) hours as needed for wheezing or  shortness of breath (cough, shortness of breath or wheezing.). 1 Inhaler 6  . amLODipine (NORVASC) 10 MG tablet Take 1 tablet (10 mg total) by mouth daily. 30 tablet 11  . atorvastatin (LIPITOR) 40 MG tablet TAKE 1 TABLET BY MOUTH DAILY AT 6 PM. 90 tablet 1  . cetirizine (ZYRTEC) 10 MG tablet TAKE 1 TABLET BY MOUTH EVERY DAY 90 tablet 3  . cimetidine (TAGAMET) 200 MG tablet Take 1 tablet (200 mg total) by mouth 2 (two) times daily. 60 tablet 2  . clopidogrel (PLAVIX) 75 MG tablet Take 1 tablet (75 mg total) by mouth daily. 90 tablet 3  . dolutegravir (TIVICAY) 50 MG tablet Take 1 tablet (50 mg total) by mouth daily for 30 doses. 30 tablet 5  . emtricitabine-tenofovir AF (DESCOVY) 200-25 MG  tablet Take 1 tablet by mouth daily. 30 tablet 5  . fluticasone (FLONASE) 50 MCG/ACT nasal spray SPRAY 2 SPRAYS INTO EACH NOSTRIL EVERY DAY 16 mL 0  . gabapentin (NEURONTIN) 400 MG capsule Take 1 capsule (400 mg total) by mouth 3 (three) times daily. Start taking 1 tab daily x 7d, then twice a day x 7d, then can do TID (Patient not taking: Reported on 09/07/2019) 90 capsule 5  . hydrochlorothiazide (HYDRODIURIL) 25 MG tablet TAKE 1 TABLET BY MOUTH EVERY DAY 90 tablet 1  . metroNIDAZOLE (FLAGYL) 500 MG tablet Take 1 tablet (500 mg total) by mouth 2 (two) times daily. 14 tablet 0  . PARoxetine (PAXIL) 30 MG tablet TAKE 1 TABLET BY MOUTH EVERY DAY (Patient taking differently: Take 30 mg by mouth daily. ) 90 tablet 4  . potassium chloride SA (KLOR-CON) 20 MEQ tablet Take 1 tablet (20 mEq total) by mouth 2 (two) times daily for 14 days. 28 tablet 0  . traZODone (DESYREL) 50 MG tablet TAKE 1/2 TABLETS BY MOUTH AT BEDTIME AS NEEDED FOR SLEEP. 30 tablet 3  . vitamin B-12 ([employer] B-12) 500 MCG tablet Take 1 tablet (500 mcg total) by mouth daily. 90 tablet 3  . Vitamin D, Ergocalciferol, (DRISDOL) 1.25 MG (50000 UNIT) CAPS capsule TAKE 1 CAPSULE BY MOUTH ONE TIME PER WEEK 8 capsule 1   No facility-administered medications prior to visit.    Allergies  Allergen Reactions  . Lisinopril Swelling    Review of Systems  Respiratory: Positive for cough and shortness of breath.        Objective:    Physical Exam Constitutional:      General: She is in acute distress.  HENT:     Head: Normocephalic and atraumatic.  Cardiovascular:     Rate and Rhythm: Normal rate and regular rhythm.     Pulses: Normal pulses.     Heart sounds: Normal heart sounds.     Comments: Right lower rib pain 7-9 Pulmonary:     Effort: Pulmonary effort is normal.     Comments: diminished guarding  Skin:    General: Skin is warm.     Capillary Refill: Capillary refill takes less than 2 seconds.  Neurological:     General: No  focal deficit present.     Mental Status: She is alert and oriented to person, place, and time.  Psychiatric:        Mood and Affect: Mood normal.        Behavior: Behavior normal.        Thought Content: Thought content normal.        Judgment: Judgment normal.     BP (!) 133/94  Pulse 80   Temp 97.7 F (36.5 C) (Temporal)   Ht 5\' 3"  (1.6 m)   Wt 157 lb (71.2 kg)   LMP 08/04/2014   SpO2 100%   BMI 27.81 kg/m  Wt Readings from Last 3 Encounters:  12/01/20 157 lb (71.2 kg)  10/18/20 159 lb (72.1 kg)  08/09/20 161 lb 12.8 oz (73.4 kg)    Health Maintenance Due  Topic Date Due  . COVID-19 Vaccine (1) Never done    There are no preventive care reminders to display for this patient.   Lab Results  Component Value Date   TSH 3.152 03/28/2019   Lab Results  Component Value Date   WBC 3.4 (L) 10/18/2020   HGB 14.9 10/18/2020   HCT 43.2 10/18/2020   MCV 89.1 10/18/2020   PLT 175 10/18/2020   Lab Results  Component Value Date   NA 141 10/18/2020   K 3.7 10/18/2020   CO2 29 10/18/2020   GLUCOSE 90 10/18/2020   BUN 11 10/18/2020   CREATININE 0.81 10/18/2020   BILITOT 1.2 10/18/2020   ALKPHOS 89 09/26/2020   AST 19 10/18/2020   ALT 24 10/18/2020   PROT 7.2 10/18/2020   ALBUMIN 4.0 09/26/2020   CALCIUM 9.4 10/18/2020   ANIONGAP 10 03/28/2019   Lab Results  Component Value Date   CHOL 146 08/09/2020   Lab Results  Component Value Date   HDL 63 08/09/2020   Lab Results  Component Value Date   LDLCALC 69 08/09/2020   Lab Results  Component Value Date   TRIG 72 08/09/2020   Lab Results  Component Value Date   CHOLHDL 2.3 08/09/2020   Lab Results  Component Value Date   HGBA1C 5.2 03/28/2019       Assessment & Plan:   Problem List Items Addressed This Visit   None   Visit Diagnoses    Rib pain on right side    -  Primary Trial medrol Dosepak continue OTC treatment  FU if symptoms persist or get worse especially any change in breathing.     Relevant Orders   DG Ribs Unilateral Right (Completed)       Meds ordered this encounter  Medications  . methylPREDNISolone (MEDROL) 4 MG TBPK tablet    Sig: Follow package instructions.    Dispense:  21 tablet    Refill:  0    Do not add to the "Automatic Refill" notification system.    Order Specific Question:   Supervising Provider    Answer:   Tresa Garter [[id number]]     [name], NP

## 2020-12-03 ENCOUNTER — Encounter: Payer: Self-pay | Admitting: Nurse Practitioner

## 2020-12-05 ENCOUNTER — Ambulatory Visit (INDEPENDENT_AMBULATORY_CARE_PROVIDER_SITE_OTHER): Payer: Medicare Other

## 2020-12-05 DIAGNOSIS — I63132 Cerebral infarction due to embolism of left carotid artery: Secondary | ICD-10-CM

## 2020-12-05 LAB — CUP PACEART REMOTE DEVICE CHECK
Date Time Interrogation Session: 20211221013243
Implantable Pulse Generator Implant Date: 20200413

## 2020-12-20 NOTE — Progress Notes (Signed)
Carelink Summary Report / Loop Recorder 

## 2020-12-22 ENCOUNTER — Other Ambulatory Visit: Payer: Self-pay | Admitting: Internal Medicine

## 2021-01-03 ENCOUNTER — Other Ambulatory Visit: Payer: Self-pay | Admitting: Internal Medicine

## 2021-01-03 DIAGNOSIS — G629 Polyneuropathy, unspecified: Secondary | ICD-10-CM

## 2021-01-05 ENCOUNTER — Ambulatory Visit (INDEPENDENT_AMBULATORY_CARE_PROVIDER_SITE_OTHER): Payer: Medicare Other

## 2021-01-05 DIAGNOSIS — I63132 Cerebral infarction due to embolism of left carotid artery: Secondary | ICD-10-CM | POA: Diagnosis not present

## 2021-01-05 LAB — CUP PACEART REMOTE DEVICE CHECK
Date Time Interrogation Session: 20220121013744
Implantable Pulse Generator Implant Date: 20200413

## 2021-01-18 NOTE — Progress Notes (Signed)
Carelink Summary Report / Loop Recorder 

## 2021-01-29 ENCOUNTER — Telehealth: Payer: Self-pay

## 2021-01-29 NOTE — Telephone Encounter (Signed)
Carelink alert received [facility] sent report with no details. States she was out of town and came in contact with monitor but has no complaints. Advised after sending we will review and call if we see anything concerning. Verbalized understanding.

## 2021-02-05 ENCOUNTER — Telehealth: Payer: Self-pay | Admitting: Nurse Practitioner

## 2021-02-05 ENCOUNTER — Ambulatory Visit (INDEPENDENT_AMBULATORY_CARE_PROVIDER_SITE_OTHER): Payer: Medicare Other

## 2021-02-05 ENCOUNTER — Ambulatory Visit: Payer: Medicare Other | Admitting: Nurse Practitioner

## 2021-02-05 DIAGNOSIS — I63132 Cerebral infarction due to embolism of left carotid artery: Secondary | ICD-10-CM

## 2021-02-05 NOTE — Telephone Encounter (Signed)
Pt was called and was rescheduled

## 2021-02-05 NOTE — Telephone Encounter (Signed)
Full remote transmission received on 02/05/2021 with no new observations.

## 2021-02-07 ENCOUNTER — Other Ambulatory Visit: Payer: Self-pay

## 2021-02-07 ENCOUNTER — Ambulatory Visit (INDEPENDENT_AMBULATORY_CARE_PROVIDER_SITE_OTHER): Payer: Medicare Other | Admitting: Nurse Practitioner

## 2021-02-07 ENCOUNTER — Encounter: Payer: Self-pay | Admitting: Nurse Practitioner

## 2021-02-07 VITALS — BP 116/88 | HR 92 | Temp 97.5°F | Ht 63.0 in | Wt 159.0 lb

## 2021-02-07 DIAGNOSIS — R200 Anesthesia of skin: Secondary | ICD-10-CM

## 2021-02-07 DIAGNOSIS — H9313 Tinnitus, bilateral: Secondary | ICD-10-CM | POA: Diagnosis not present

## 2021-02-07 DIAGNOSIS — I6930 Unspecified sequelae of cerebral infarction: Secondary | ICD-10-CM | POA: Diagnosis not present

## 2021-02-07 DIAGNOSIS — M79601 Pain in right arm: Secondary | ICD-10-CM | POA: Diagnosis not present

## 2021-02-07 DIAGNOSIS — R202 Paresthesia of skin: Secondary | ICD-10-CM | POA: Diagnosis not present

## 2021-02-07 LAB — CUP PACEART REMOTE DEVICE CHECK
Date Time Interrogation Session: 20220221025632
Implantable Pulse Generator Implant Date: 20200413

## 2021-02-07 NOTE — Progress Notes (Signed)
[place], [place]  [address] Phone:  [phone]   Fax:  [phone]   Established Patient Office Visit  Subjective:  Patient ID: Abigail Medina, female    DOB: 04/30/1966  Age: 55 y.o. MRN: [id number]  CC:  Chief Complaint  Patient presents with  . Follow-up    Having ringing in ears, it getting worse, having abnormal gait, no headaches, so problem with allergy in the morning , having tinging in arms and numbness      HPI Abigail Medina presents for follow up. She  has a past medical history of HIV (human immunodeficiency virus infection) ([place]), Hypertension, Seizures ([place]), Stroke ([place]), and Vision abnormalities.   Tinnitus Patient presents with tinnitus. Onset of symptoms was gradual starting several months ago ago with gradually worsening course since that time. Patient describes the tinnitus as constant and progressive located in the bilateral ear. The quality is described as variable pitch and suction or noise that sounds like seashells. The pattern is nonpulsatile with an intensity that is medium. Patient describes her level of annoyance as moderately annoying, always aware. Associated symptoms include dizziness Family history is negative family history for tinnitus Patient has had no prior evaluation, treatment or surgery for tinnitus Patient does not have hearing aids at this time. Previous treatments include none.   Past Medical History:  Diagnosis Date  . HIV (human immunodeficiency virus infection) ([place])   . Hypertension   . Seizures ([place])   . Stroke ([place])   . Vision abnormalities     Past Surgical History:  Procedure Laterality Date  . LOOP RECORDER INSERTION N/A 03/29/2019   Procedure: LOOP RECORDER INSERTION;  Surgeon: Constance Haw, MD;  Location: [facility];  Service: Cardiovascular;  Laterality: N/A;    Family History  Problem Relation Age of Onset  . Hypertension Mother     Social  History   Socioeconomic History  . Marital status: Single    Spouse name: Not on file  . Number of children: Not on file  . Years of education: Not on file  . Highest education level: Not on file  Occupational History  . Not on file  Tobacco Use  . Smoking status: Former Smoker    Packs/day: 0.00    Types: Cigarettes    Start date: 10/17/2015    Quit date: 08/10/2019    Years since quitting: 1.5  . Smokeless tobacco: Never Used  Vaping Use  . Vaping Use: Never used  Substance and Sexual Activity  . Alcohol use: Yes    Alcohol/week: 2.0 - 3.0 standard drinks    Types: 2 - 3 Standard drinks or equivalent per week    Comment: liquor or beer; stated has increased with social meetings  . Drug use: No  . Sexual activity: Yes    Partners: Male    Birth control/protection: Condom  Other Topics Concern  . Not on file  Social History Narrative  . Not on file   Social Determinants of Health   Financial Resource Strain: Not on file  Food Insecurity: Not on file  Transportation Needs: Not on file  Physical Activity: Not on file  Stress: Not on file  Social Connections: Not on file  Intimate Partner Violence: Not on file    Outpatient Medications Prior to Visit  Medication Sig Dispense Refill  . amLODipine (NORVASC) 10 MG tablet Take 1 tablet (10 mg total) by mouth daily. 30 tablet  11  . atorvastatin (LIPITOR) 40 MG tablet TAKE 1 TABLET BY MOUTH DAILY AT 6 PM. 90 tablet 1  . cetirizine (ZYRTEC) 10 MG tablet TAKE 1 TABLET BY MOUTH EVERY DAY 90 tablet 3  . cimetidine (TAGAMET) 200 MG tablet Take 1 tablet (200 mg total) by mouth 2 (two) times daily. 60 tablet 2  . clopidogrel (PLAVIX) 75 MG tablet Take 1 tablet (75 mg total) by mouth daily. 90 tablet 3  . fluticasone (FLONASE) 50 MCG/ACT nasal spray SPRAY 2 SPRAYS INTO EACH NOSTRIL EVERY DAY 16 mL 0  . hydrochlorothiazide (HYDRODIURIL) 25 MG tablet TAKE 1 TABLET BY MOUTH EVERY DAY 90 tablet 1  . PARoxetine (PAXIL) 30 MG tablet TAKE  1 TABLET BY MOUTH EVERY DAY (Patient taking differently: Take 30 mg by mouth daily.) 90 tablet 4  . traZODone (DESYREL) 50 MG tablet TAKE 1/2 TABLETS BY MOUTH AT BEDTIME AS NEEDED FOR SLEEP. 30 tablet 3  . vitamin B-12 ([employer] B-12) 500 MCG tablet Take 1 tablet (500 mcg total) by mouth daily. 90 tablet 3  . Vitamin D, Ergocalciferol, (DRISDOL) 1.25 MG (50000 UNIT) CAPS capsule TAKE 1 CAPSULE BY MOUTH ONE TIME PER WEEK 8 capsule 1  . albuterol (PROVENTIL HFA;VENTOLIN HFA) 108 (90 Base) MCG/ACT inhaler Inhale 2 puffs into the lungs every 4 (four) hours as needed for wheezing or shortness of breath (cough, shortness of breath or wheezing.). (Patient not taking: Reported on 02/07/2021) 1 Inhaler 6  . dolutegravir (TIVICAY) 50 MG tablet Take 1 tablet (50 mg total) by mouth daily for 30 doses. 30 tablet 5  . emtricitabine-tenofovir AF (DESCOVY) 200-25 MG tablet Take 1 tablet by mouth daily. 30 tablet 5  . gabapentin (NEURONTIN) 400 MG capsule Take 1 capsule (400 mg total) by mouth 3 (three) times daily. Start taking 1 tab daily x 7d, then twice a day x 7d, then can do TID (Patient not taking: Reported on 09/07/2019) 90 capsule 5  . metroNIDAZOLE (FLAGYL) 500 MG tablet Take 1 tablet (500 mg total) by mouth 2 (two) times daily. 14 tablet 0  . potassium chloride SA (KLOR-CON) 20 MEQ tablet Take 1 tablet (20 mEq total) by mouth 2 (two) times daily for 14 days. 28 tablet 0   No facility-administered medications prior to visit.    Allergies  Allergen Reactions  . Lisinopril Swelling    ROS Review of Systems  Respiratory: Negative for shortness of breath.   Cardiovascular: Negative for chest pain.  Musculoskeletal: Positive for back pain. Negative for neck pain.       Shoulder pain   Neurological: Positive for weakness (no change) and numbness (in left hand 4 fingers).      Objective:    Physical Exam Constitutional:      Appearance: She is normal weight. She is not ill-appearing.  HENT:     Head:  Normocephalic and atraumatic.     Right Ear: Tympanic membrane normal. There is no impacted cerumen.     Left Ear: Tympanic membrane normal. There is no impacted cerumen.     Ears:     Comments: Scant dry piece of thin cerumen bilateral ear canal Cardiovascular:     Rate and Rhythm: Normal rate and regular rhythm.     Pulses: Normal pulses.     Heart sounds: Normal heart sounds.  Pulmonary:     Effort: Pulmonary effort is normal.     Breath sounds: Normal breath sounds.  Abdominal:     Palpations: Abdomen is soft.  Musculoskeletal:     Cervical back: Normal range of motion. No rigidity or tenderness.     Comments: LEE weakness due to stroke   Skin:    General: Skin is warm.     Capillary Refill: Capillary refill takes less than 2 seconds.  Neurological:     General: No focal deficit present.     Mental Status: She is alert.  Psychiatric:        Mood and Affect: Mood normal.        Behavior: Behavior normal.     BP 116/88 (BP Location: Left Arm, Patient Position: Sitting, Cuff Size: Normal)   Pulse 92   Temp (!) 97.5 F (36.4 C) (Temporal)   Ht 5\' 3"  (1.6 m)   Wt 159 lb (72.1 kg)   LMP 08/04/2014   SpO2 99%   BMI 28.17 kg/m  Wt Readings from Last 3 Encounters:  02/07/21 159 lb (72.1 kg)  12/01/20 157 lb (71.2 kg)  10/18/20 159 lb (72.1 kg)     Health Maintenance Due  Topic Date Due  . COVID-19 Vaccine (1) Never done    There are no preventive care reminders to display for this patient.  Lab Results  Component Value Date   TSH 3.152 03/28/2019   Lab Results  Component Value Date   WBC 3.4 (L) 10/18/2020   HGB 14.9 10/18/2020   HCT 43.2 10/18/2020   MCV 89.1 10/18/2020   PLT 175 10/18/2020   Lab Results  Component Value Date   NA 141 10/18/2020   K 3.7 10/18/2020   CO2 29 10/18/2020   GLUCOSE 90 10/18/2020   BUN 11 10/18/2020   CREATININE 0.81 10/18/2020   BILITOT 1.2 10/18/2020   ALKPHOS 89 09/26/2020   AST 19 10/18/2020   ALT 24 10/18/2020    PROT 7.2 10/18/2020   ALBUMIN 4.0 09/26/2020   CALCIUM 9.4 10/18/2020   ANIONGAP 10 03/28/2019   Lab Results  Component Value Date   CHOL 146 08/09/2020   Lab Results  Component Value Date   HDL 63 08/09/2020   Lab Results  Component Value Date   LDLCALC 69 08/09/2020   Lab Results  Component Value Date   TRIG 72 08/09/2020   Lab Results  Component Value Date   CHOLHDL 2.3 08/09/2020   Lab Results  Component Value Date   HGBA1C 5.2 03/28/2019      Assessment & Plan:   Problem List Items Addressed This Visit      Other   History of CVA with residual deficit BP stable on preventative therapy will continue to monitor closely    Other Visit Diagnoses    Tinnitus aurium, bilateral    -  Primary Worsening  Referral due to history for further evaluation   Relevant Orders   Ambulatory referral to ENT   PT vestibular rehab   Right arm pain  Forearm pain will continue to monitor    Numbness and tingling   Ongoing  Generalized to left side thought to be due to stroke         No orders of the defined types were placed in this encounter.   Follow-up: Return in about 3 months (around 05/07/2021).    [name], NP

## 2021-02-09 ENCOUNTER — Ambulatory Visit: Payer: Medicare Other | Admitting: Nurse Practitioner

## 2021-02-13 NOTE — Progress Notes (Signed)
Carelink Summary Report / Loop Recorder 

## 2021-02-21 ENCOUNTER — Telehealth: Payer: Self-pay | Admitting: Emergency Medicine

## 2021-02-21 NOTE — Telephone Encounter (Signed)
Received alert in Carelink with no transmission info. Patient will send remote transmission when she returns home today.

## 2021-02-22 ENCOUNTER — Other Ambulatory Visit: Payer: Self-pay | Admitting: Internal Medicine

## 2021-02-22 ENCOUNTER — Other Ambulatory Visit: Payer: Self-pay | Admitting: Nurse Practitioner

## 2021-02-22 DIAGNOSIS — E559 Vitamin D deficiency, unspecified: Secondary | ICD-10-CM

## 2021-02-22 DIAGNOSIS — I10 Essential (primary) hypertension: Secondary | ICD-10-CM

## 2021-02-23 NOTE — Telephone Encounter (Signed)
The pt tried to send a transmission but received the error code [id number]. She was not able to call tech support at the moment. I gave her the number to tech support to get additional help for her monitor.

## 2021-03-07 ENCOUNTER — Telehealth: Payer: Self-pay

## 2021-03-07 NOTE — Telephone Encounter (Signed)
Pt called tech support and receiving a new handheld in 7-10 business days.

## 2021-03-07 NOTE — Telephone Encounter (Signed)
Successful telephone encounter to [facility]. Luis to follow up on her ability to contact [employer] support or error code [id number] as last scheduled remote transmission 02/21/21 was received with event report however no details were available. Patient was provided Tech support number at that time and stated she would call. Today patient states she lost number. Next remote transmission scheduled for tomorrow 03/08/21. Patient again provided number for [employer] Tech support and strongly encouraged to call for troubleshooting. Patient verbalized understands and agrees to call. Will continue to follow.

## 2021-03-08 ENCOUNTER — Ambulatory Visit (INDEPENDENT_AMBULATORY_CARE_PROVIDER_SITE_OTHER): Payer: Medicare Other

## 2021-03-08 DIAGNOSIS — I63132 Cerebral infarction due to embolism of left carotid artery: Secondary | ICD-10-CM

## 2021-03-11 LAB — CUP PACEART REMOTE DEVICE CHECK
Date Time Interrogation Session: 20220326035626
Implantable Pulse Generator Implant Date: 20200413

## 2021-03-17 ENCOUNTER — Other Ambulatory Visit: Payer: Self-pay | Admitting: Internal Medicine

## 2021-03-20 NOTE — Progress Notes (Signed)
Carelink Summary Report / Loop Recorder 

## 2021-03-21 ENCOUNTER — Other Ambulatory Visit: Payer: Self-pay | Admitting: Nurse Practitioner

## 2021-03-21 ENCOUNTER — Other Ambulatory Visit: Payer: Self-pay | Admitting: Internal Medicine

## 2021-03-21 DIAGNOSIS — G4700 Insomnia, unspecified: Secondary | ICD-10-CM

## 2021-04-05 ENCOUNTER — Other Ambulatory Visit: Payer: Self-pay | Admitting: Nurse Practitioner

## 2021-04-08 DIAGNOSIS — H5213 Myopia, bilateral: Secondary | ICD-10-CM | POA: Diagnosis not present

## 2021-04-09 ENCOUNTER — Ambulatory Visit (INDEPENDENT_AMBULATORY_CARE_PROVIDER_SITE_OTHER): Payer: Medicare Other

## 2021-04-09 DIAGNOSIS — I63132 Cerebral infarction due to embolism of left carotid artery: Secondary | ICD-10-CM

## 2021-04-16 ENCOUNTER — Other Ambulatory Visit: Payer: Self-pay | Admitting: Internal Medicine

## 2021-04-16 LAB — CUP PACEART REMOTE DEVICE CHECK
Date Time Interrogation Session: 20220428035715
Implantable Pulse Generator Implant Date: 20200413

## 2021-04-17 ENCOUNTER — Other Ambulatory Visit (HOSPITAL_COMMUNITY)
Admission: RE | Admit: 2021-04-17 | Discharge: 2021-04-17 | Disposition: A | Discharge status: Home / Self Care | Payer: Medicare Other | Source: Ambulatory Visit | Attending: Internal Medicine | Admitting: Internal Medicine

## 2021-04-17 ENCOUNTER — Other Ambulatory Visit: Payer: Medicare Other

## 2021-04-17 ENCOUNTER — Other Ambulatory Visit: Payer: Self-pay

## 2021-04-17 DIAGNOSIS — Z113 Encounter for screening for infections with a predominantly sexual mode of transmission: Secondary | ICD-10-CM

## 2021-04-17 DIAGNOSIS — Z79899 Other long term (current) drug therapy: Secondary | ICD-10-CM | POA: Diagnosis not present

## 2021-04-17 DIAGNOSIS — B20 Human immunodeficiency virus [HIV] disease: Secondary | ICD-10-CM

## 2021-04-18 LAB — T-HELPER CELL (CD4) - (RCID CLINIC ONLY)
CD4 % Helper T Cell: 40 % (ref 33–65)
CD4 T Cell Abs: 456 /uL (ref 400–1790)

## 2021-04-18 LAB — URINE CYTOLOGY ANCILLARY ONLY
Chlamydia: NEGATIVE
Comment: NEGATIVE
Comment: NORMAL
Neisseria Gonorrhea: NEGATIVE

## 2021-04-19 LAB — CBC WITH DIFFERENTIAL/PLATELET
Absolute Monocytes: 278 cells/uL (ref 200–950)
Basophils Absolute: 9 cells/uL (ref 0–200)
Basophils Relative: 0.3 %
Eosinophils Absolute: 49 cells/uL (ref 15–500)
Eosinophils Relative: 1.7 %
HCT: 44.7 % (ref 35.0–45.0)
Hemoglobin: 15.4 g/dL (ref 11.7–15.5)
Lymphs Abs: 1143 cells/uL (ref 850–3900)
MCH: 31.2 pg (ref 27.0–33.0)
MCHC: 34.5 g/dL (ref 32.0–36.0)
MCV: 90.5 fL (ref 80.0–100.0)
MPV: 10.5 fL (ref 7.5–12.5)
Monocytes Relative: 9.6 %
Neutro Abs: 1421 cells/uL — ABNORMAL LOW (ref 1500–7800)
Neutrophils Relative %: 49 %
Platelets: 176 10*3/uL (ref 140–400)
RBC: 4.94 10*6/uL (ref 3.80–5.10)
RDW: 13 % (ref 11.0–15.0)
Total Lymphocyte: 39.4 %
WBC: 2.9 10*3/uL — ABNORMAL LOW (ref 3.8–10.8)

## 2021-04-19 LAB — LIPID PANEL
Cholesterol: 167 mg/dL (ref <200)
HDL: 67 mg/dL (ref >=50)
LDL Cholesterol (Calc): 83 mg/dL (calc)
Non-HDL Cholesterol (Calc): 100 mg/dL (calc) (ref <130)
Total CHOL/HDL Ratio: 2.5 (calc) (ref <5.0)
Triglycerides: 79 mg/dL (ref <150)

## 2021-04-19 LAB — HIV-1 RNA QUANT-NO REFLEX-BLD
HIV 1 RNA Quant: NOT DETECTED Copies/mL
HIV-1 RNA Quant, Log: NOT DETECTED Log cps/mL

## 2021-04-19 LAB — COMPREHENSIVE METABOLIC PANEL
AG Ratio: 1.6 (calc) (ref 1.0–2.5)
ALT: 40 U/L — ABNORMAL HIGH (ref 6–29)
AST: 32 U/L (ref 10–35)
Albumin: 4.6 g/dL (ref 3.6–5.1)
Alkaline phosphatase (APISO): 80 U/L (ref 37–153)
BUN: 12 mg/dL (ref 7–25)
CO2: 30 mmol/L (ref 20–32)
Calcium: 9.6 mg/dL (ref 8.6–10.4)
Chloride: 100 mmol/L (ref 98–110)
Creat: 0.79 mg/dL (ref 0.50–1.05)
Globulin: 2.9 g/dL (calc) (ref 1.9–3.7)
Glucose, Bld: 76 mg/dL (ref 65–99)
Potassium: 3.5 mmol/L (ref 3.5–5.3)
Sodium: 139 mmol/L (ref 135–146)
Total Bilirubin: 1 mg/dL (ref 0.2–1.2)
Total Protein: 7.5 g/dL (ref 6.1–8.1)

## 2021-04-19 LAB — RPR: RPR Ser Ql: NONREACTIVE

## 2021-04-27 NOTE — Progress Notes (Signed)
Carelink Summary Report / Loop Recorder 

## 2021-05-07 ENCOUNTER — Other Ambulatory Visit: Payer: Self-pay | Admitting: Internal Medicine

## 2021-05-07 ENCOUNTER — Other Ambulatory Visit: Payer: Self-pay

## 2021-05-07 ENCOUNTER — Ambulatory Visit (INDEPENDENT_AMBULATORY_CARE_PROVIDER_SITE_OTHER): Payer: Medicare Other | Admitting: Internal Medicine

## 2021-05-07 ENCOUNTER — Ambulatory Visit: Payer: Medicare Other | Admitting: Nurse Practitioner

## 2021-05-07 ENCOUNTER — Encounter: Payer: Self-pay | Admitting: Internal Medicine

## 2021-05-07 VITALS — BP 115/85 | HR 70 | Temp 98.7°F | Ht 63.0 in | Wt 145.0 lb

## 2021-05-07 DIAGNOSIS — Z7189 Other specified counseling: Secondary | ICD-10-CM | POA: Diagnosis not present

## 2021-05-07 DIAGNOSIS — I10 Essential (primary) hypertension: Secondary | ICD-10-CM

## 2021-05-07 DIAGNOSIS — F5102 Adjustment insomnia: Secondary | ICD-10-CM

## 2021-05-07 DIAGNOSIS — B20 Human immunodeficiency virus [HIV] disease: Secondary | ICD-10-CM

## 2021-05-07 NOTE — Progress Notes (Signed)
RFV : follow up for hiv disease  Patient ID: Abigail Medina, female   DOB: [birth year], 55 y.o.   MRN: [id number]  HPI 55yo F with HIV disease, CD 4 count 456/LV<20 on tivicay/descovy. Since her last visit, she states that Her husband - unexpectedly died of heart attack. She still has emotional lability.  Son is helping  Has not redone his affairs yet. Son moved back home  Sleep and appetite improving  Outpatient Encounter Medications as of 05/07/2021  Medication Sig  . albuterol (PROVENTIL HFA;VENTOLIN HFA) 108 (90 Base) MCG/ACT inhaler Inhale 2 puffs into the lungs every 4 (four) hours as needed for wheezing or shortness of breath (cough, shortness of breath or wheezing.). (Patient not taking: Reported on 02/07/2021)  . amLODipine (NORVASC) 10 MG tablet TAKE 1 TABLET BY MOUTH EVERY DAY  . atorvastatin (LIPITOR) 40 MG tablet TAKE 1 TABLET BY MOUTH DAILY AT 6 PM.  . cetirizine (ZYRTEC) 10 MG tablet TAKE 1 TABLET BY MOUTH EVERY DAY  . cimetidine (TAGAMET) 200 MG tablet Take 1 tablet (200 mg total) by mouth 2 (two) times daily.  . clopidogrel (PLAVIX) 75 MG tablet Take 1 tablet (75 mg total) by mouth daily.  . dolutegravir (TIVICAY) 50 MG tablet Take 1 tablet (50 mg total) by mouth daily for 30 doses.  Marland Kitchen emtricitabine-tenofovir AF (DESCOVY) 200-25 MG tablet Take 1 tablet by mouth daily.  . fluticasone (FLONASE) 50 MCG/ACT nasal spray SPRAY 2 SPRAYS INTO EACH NOSTRIL EVERY DAY  . hydrochlorothiazide (HYDRODIURIL) 25 MG tablet TAKE 1 TABLET BY MOUTH EVERY DAY  . PARoxetine (PAXIL) 30 MG tablet TAKE 1 TABLET BY MOUTH EVERY DAY (Patient taking differently: Take 30 mg by mouth daily.)  . traZODone (DESYREL) 50 MG tablet TAKE 1/2 TABLETS BY MOUTH AT BEDTIME AS NEEDED FOR SLEEP.  Marland Kitchen vitamin B-12 ([facility] B-12) 500 MCG tablet Take 1 tablet (500 mcg total) by mouth daily.  . Vitamin D, Ergocalciferol, (DRISDOL) 1.25 MG (50000 UNIT) CAPS capsule TAKE 1 CAPSULE BY MOUTH ONE TIME PER WEEK   No  facility-administered encounter medications on file as of 05/07/2021.     Patient Active Problem List   Diagnosis Date Noted  . Healthcare maintenance 04/13/2019  . Aneurysm of thoracic aorta ([place]) 03/29/2019  . Cerebral thrombosis with cerebral infarction 03/28/2019  . CVA (cerebral vascular accident) ([place]) 03/28/2019  . Acute focal neurological deficit 03/27/2019  . Hypokalemia 03/27/2019  . Hyperbilirubinemia 03/27/2019  . History of CVA with residual deficit   . Pain of back and left lower extremity 12/15/2018  . Upper respiratory infection, viral 12/15/2018  . 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 Tobacco dependence 06/02/2015  . Numbness and tingling of right arm 05/30/2015  . Right arm weakness 05/30/2015  . Arm numbness left 10/10/2014  . Vitamin D deficiency 08/05/2014  . Allergic rhinitis 05/25/2013  . 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 Polypharmacy 08/20/2012  . HIV (human immunodeficiency virus infection) ([place]) 08/20/2012  . HLD (hyperlipidemia) 07/15/2012  . BP (high blood pressure) 07/15/2012  . Cerebrovascular accident, late effects 01/30/2012  . Depression with anxiety 07/15/2011  . Seborrhea capitis 04/18/2011  . Seborrheic dermatitis 03/07/2011  . 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 VAGINAL DISCHARGE 12/26/2010  . NEVUS 08/29/2010  . AMENORRHEA 07/19/2010  . SKIN RASH 05/21/2010  . THRUSH 12/28/2009  . Human immunodeficiency virus (HIV) disease ([place]) 08/06/2007  . Essential hypertension 08/06/2007     Health Maintenance Due  Topic Date Due  .  COVID-19 Vaccine (1) Never done    Soc hx: smoking and alcohol use - she was drinking to go to sleep that first month after husband's death. But now slow down. Review of Systems Review of Systems  Constitutional: Negative for fever, chills, diaphoresis, activity change, appetite change, fatigue and unexpected weight change.  HENT: Negative for congestion, sore throat, rhinorrhea,  sneezing, trouble swallowing and sinus pressure.  Eyes: Negative for photophobia and visual disturbance.  Respiratory: Negative for cough, chest tightness, shortness of breath, wheezing and stridor.  Cardiovascular: Negative for chest pain, palpitations and leg swelling.  Gastrointestinal: Negative for nausea, vomiting, abdominal pain, diarrhea, constipation, blood in stool, abdominal distention and anal bleeding.  Genitourinary: Negative for dysuria, hematuria, flank pain and difficulty urinating.  Musculoskeletal: Negative for myalgias, back pain, joint swelling, arthralgias and gait problem.  Skin: Negative for color change, pallor, rash and wound.  Neurological: Negative for dizziness, tremors, weakness and light-headedness.  Hematological: Negative for adenopathy. Does not bruise/bleed easily.  Psychiatric/Behavioral: Negative for behavioral problems, confusion, sleep disturbance, dysphoric mood, decreased concentration and agitation.    Physical Exam   LMP 08/04/2014   BP 115/85   Pulse 70   Temp 98.7 F (37.1 C) (Oral)   Ht 5\' 3"  (1.6 m)   Wt 145 lb (65.8 kg)   LMP 08/04/2014   SpO2 100%   BMI 25.69 kg/m  Physical Exam  Constitutional:  oriented to person, place, and time. appears well-developed and well-nourished. No distress.  HENT: [place]/AT, PERRLA, no scleral icterus Mouth/Throat: Oropharynx is clear and moist. No oropharyngeal exudate.  Cardiovascular: Normal rate, regular rhythm and normal heart sounds. Exam reveals no gallop and no friction rub.  No murmur heard.  Pulmonary/Chest: Effort normal and breath sounds normal. No respiratory distress.  has no wheezes.  Neck = supple, no nuchal rigidity Lymphadenopathy: no cervical adenopathy. No axillary adenopathy Neurological: alert and oriented to person, place, and time.  Skin: Skin is warm and dry. No rash noted. No erythema.  Psychiatric: a normal mood and affect.  behavior is normal.   Lab Results  Component Value  Date   CD4TCELL 40 04/17/2021   Lab Results  Component Value Date   CD4TABS 456 04/17/2021   CD4TABS 453 10/18/2020   CD4TABS 573 02/02/2020   Lab Results  Component Value Date   HIV1RNAQUANT Not Detected 04/17/2021   Lab Results  Component Value Date   HEPBSAB NEG 03/10/2014   Lab Results  Component Value Date   LABRPR NON-REACTIVE 04/17/2021    CBC Lab Results  Component Value Date   WBC 2.9 (L) 04/17/2021   RBC 4.94 04/17/2021   HGB 15.4 04/17/2021   HCT 44.7 04/17/2021   PLT 176 04/17/2021   MCV 90.5 04/17/2021   MCH 31.2 04/17/2021   MCHC 34.5 04/17/2021   RDW 13.0 04/17/2021   LYMPHSABS 1,143 04/17/2021   MONOABS 0.4 03/27/2019   EOSABS 49 04/17/2021    BMET Lab Results  Component Value Date   NA 139 04/17/2021   K 3.5 04/17/2021   CL 100 04/17/2021   CO2 30 04/17/2021   GLUCOSE 76 04/17/2021   BUN 12 04/17/2021   CREATININE 0.79 04/17/2021   CALCIUM 9.6 04/17/2021   GFRNONAA 81 10/18/2020   GFRAA 94 10/18/2020      Assessment and Plan  Insomnia = appears situational. She can Take a full tablet of trazodone to see if it can help.  Grief counseling = will give janet referral; also see back  in 6 wks to see if having depressive symptoms  hiv disease = continues to take medications diligently  Health maintenance = still pre contempative for getting covid vaccine  Alcohol dependence = she is now better space. Now occasionally on weekends.

## 2021-05-10 ENCOUNTER — Ambulatory Visit: Payer: Medicare Other

## 2021-05-21 ENCOUNTER — Other Ambulatory Visit: Payer: Self-pay

## 2021-05-21 ENCOUNTER — Other Ambulatory Visit: Payer: Self-pay | Admitting: Internal Medicine

## 2021-05-21 DIAGNOSIS — B20 Human immunodeficiency virus [HIV] disease: Secondary | ICD-10-CM

## 2021-05-21 MED ORDER — TIVICAY 50 MG PO TABS: 50.0000 mg | ORAL_TABLET | Freq: Every day | ORAL | 0 refills | Status: DC

## 2021-05-21 MED ORDER — DESCOVY 200-25 MG PO TABS: 1.0000 | ORAL_TABLET | Freq: Every day | ORAL | 0 refills | Status: DC

## 2021-06-06 ENCOUNTER — Other Ambulatory Visit: Payer: Self-pay | Admitting: Internal Medicine

## 2021-06-06 DIAGNOSIS — G4700 Insomnia, unspecified: Secondary | ICD-10-CM

## 2021-06-13 ENCOUNTER — Other Ambulatory Visit: Payer: Self-pay | Admitting: Nurse Practitioner

## 2021-06-13 DIAGNOSIS — G4700 Insomnia, unspecified: Secondary | ICD-10-CM

## 2021-06-19 ENCOUNTER — Other Ambulatory Visit: Payer: Self-pay | Admitting: Nurse Practitioner

## 2021-06-19 ENCOUNTER — Telehealth: Payer: Self-pay

## 2021-06-19 DIAGNOSIS — E559 Vitamin D deficiency, unspecified: Secondary | ICD-10-CM

## 2021-06-19 NOTE — Telephone Encounter (Signed)
Call transferred to triage from front desk staff. Patient states she is not sure if she has had any fever, but felt clammy on 7/1 and grandchild tested positve on 7/2. Patient was last around grandchild on 7/2 as well. Patient states she took home covid test today and was negative, but still was feeling a bit sluggish. Also report cough. Patient switched to virtual visit on 06/20/21 with Dr. Baxter Flattery.  Eugenia Mcalpine

## 2021-06-20 ENCOUNTER — Ambulatory Visit (INDEPENDENT_AMBULATORY_CARE_PROVIDER_SITE_OTHER): Payer: Medicare Other | Admitting: Internal Medicine

## 2021-06-20 ENCOUNTER — Other Ambulatory Visit: Payer: Self-pay

## 2021-06-20 ENCOUNTER — Encounter: Payer: Self-pay | Admitting: Internal Medicine

## 2021-06-20 ENCOUNTER — Other Ambulatory Visit: Payer: Self-pay | Admitting: Internal Medicine

## 2021-06-20 DIAGNOSIS — B20 Human immunodeficiency virus [HIV] disease: Secondary | ICD-10-CM

## 2021-06-20 MED ORDER — FLUTICASONE PROPIONATE 50 MCG/ACT NA SUSP: NASAL | 1 refills | Status: AC

## 2021-06-20 MED ORDER — TIVICAY 50 MG PO TABS: 50.0000 mg | ORAL_TABLET | Freq: Every day | ORAL | 2 refills | Status: DC

## 2021-06-20 MED ORDER — DESCOVY 200-25 MG PO TABS: 1.0000 | ORAL_TABLET | Freq: Every day | ORAL | 2 refills | Status: DC

## 2021-06-20 NOTE — Progress Notes (Signed)
Virtual Visit via Telephone Note  I connected with Abigail Medina on 06/20/21 at 10:45 AM EDT by telephone and verified that I am speaking with the correct person using two identifiers.  Location: Patient: at home Provider: at clinic   I discussed the limitations, risks, security and privacy concerns of performing an evaluation and management service by telephone and the availability of in person appointments. I also discussed with the patient that there may be a patient responsible charge related to this service. The patient expressed understanding and agreed to proceed.   History of Present Illness: Recent covid exposure. Also had symptoms beforehand but tested negative;  has had cough, doesn't feel tired. Her grandson that she cares for had covid. No fever or chills, ? Possibly had low grade fever on day.  Has been doing well with taking her medication  Getting easier to sleep  Still on and off days   Observations/Objective: Fluent speech Answering questions accordingly  Assessment and Plan:  Respiratory illness = likely viral. Still suspect that she had covid but negative ag test. Appears improving. Continue with supportive care  HIV disease= continue on current regimen  Insomnia = appears improving,   Adjustment disorder = appears within normal range of grieving process.  Follow Up Instructions: Rtc to clinic in 3 months   I discussed the assessment and treatment plan with the patient. The patient was provided an opportunity to ask questions and all were answered. The patient agreed with the plan and demonstrated an understanding of the instructions.   The patient was advised to call back or seek an in-person evaluation if the symptoms worsen or if the condition fails to improve as anticipated.  I provided 10  minutes of non-face-to-face time during this encounter.   Carlyle Basques, MD

## 2021-06-23 DIAGNOSIS — H5213 Myopia, bilateral: Secondary | ICD-10-CM | POA: Diagnosis not present

## 2021-07-20 ENCOUNTER — Other Ambulatory Visit: Payer: Self-pay | Admitting: Internal Medicine

## 2021-07-20 ENCOUNTER — Other Ambulatory Visit: Payer: Self-pay | Admitting: Nurse Practitioner

## 2021-07-20 ENCOUNTER — Telehealth: Payer: Self-pay

## 2021-07-20 DIAGNOSIS — I10 Essential (primary) hypertension: Secondary | ICD-10-CM

## 2021-07-20 NOTE — Telephone Encounter (Signed)
Rcvd Refill request and reviewed last refill for HCTZ. Sates in RX  that further refills need to come from PCP. I refused to refill and called patient but there was no answer.

## 2021-07-23 ENCOUNTER — Other Ambulatory Visit: Payer: Self-pay

## 2021-07-23 ENCOUNTER — Telehealth (HOSPITAL_COMMUNITY): Payer: Self-pay | Admitting: Nurse Practitioner

## 2021-07-23 DIAGNOSIS — I10 Essential (primary) hypertension: Secondary | ICD-10-CM

## 2021-07-23 MED ORDER — HYDROCHLOROTHIAZIDE 25 MG PO TABS: 25.0000 mg | ORAL_TABLET | Freq: Every day | ORAL | 3 refills | Status: DC

## 2021-07-23 NOTE — Telephone Encounter (Signed)
Pt request refill for hydrochlorothiazide (HYDRODIURIL) 25 MG tablet [id number]    Pharmacy  [id number] Phone:  [phone] Fax:  [phone]  Please advise and thank you

## 2021-07-23 NOTE — Telephone Encounter (Signed)
Medication refilled

## 2021-07-25 ENCOUNTER — Other Ambulatory Visit: Payer: Self-pay | Admitting: Internal Medicine

## 2021-08-02 ENCOUNTER — Ambulatory Visit (INDEPENDENT_AMBULATORY_CARE_PROVIDER_SITE_OTHER): Payer: Medicare Other | Admitting: Nurse Practitioner

## 2021-08-02 ENCOUNTER — Other Ambulatory Visit: Payer: Self-pay

## 2021-08-02 ENCOUNTER — Telehealth: Payer: Self-pay

## 2021-08-02 ENCOUNTER — Encounter: Payer: Self-pay | Admitting: Nurse Practitioner

## 2021-08-02 VITALS — BP 131/96 | HR 77 | Temp 97.3°F | Ht 63.0 in | Wt 144.0 lb

## 2021-08-02 DIAGNOSIS — Z1231 Encounter for screening mammogram for malignant neoplasm of breast: Secondary | ICD-10-CM

## 2021-08-02 DIAGNOSIS — R21 Rash and other nonspecific skin eruption: Secondary | ICD-10-CM

## 2021-08-02 DIAGNOSIS — Z634 Disappearance and death of family member: Secondary | ICD-10-CM | POA: Diagnosis not present

## 2021-08-02 DIAGNOSIS — Z716 Tobacco abuse counseling: Secondary | ICD-10-CM

## 2021-08-02 DIAGNOSIS — I10 Essential (primary) hypertension: Secondary | ICD-10-CM | POA: Diagnosis not present

## 2021-08-02 LAB — POCT URINALYSIS DIPSTICK
Bilirubin, UA: NEGATIVE
Blood, UA: NEGATIVE
Glucose, UA: NEGATIVE
Ketones, UA: NEGATIVE
Leukocytes, UA: NEGATIVE
Nitrite, UA: NEGATIVE
Protein, UA: POSITIVE — AB
Spec Grav, UA: 1.025 (ref 1.010–1.025)
Urobilinogen, UA: 1 E.U./dL
pH, UA: 6.5 (ref 5.0–8.0)

## 2021-08-02 MED ORDER — TRIAMCINOLONE 0.1 % CREAM:EUCERIN CREAM 1:1: 1.0000 "application " | TOPICAL_CREAM | Freq: Two times a day (BID) | CUTANEOUS | 0 refills | Status: AC

## 2021-08-02 NOTE — Telephone Encounter (Signed)
Cream for hand to be sent to pharmacy

## 2021-08-02 NOTE — Progress Notes (Signed)
[place], [place]  [address] Phone:  [phone]   Fax:  [phone]   Established Patient Office Visit  Subjective:  Patient ID: Abigail Medina, female    DOB: [birth year]  Age: 55 y.o. MRN: [id number]  CC:  Chief Complaint  Patient presents with   Follow-up    3 month follow up; discoloration in both legs    HPI [name] presents for follow-up. She  has a past medical history of HIV (human immunodeficiency virus infection) ([place]), Hypertension, Seizures ([place]), Stroke ([place]), and Vision abnormalities.   Is in today for follow-up of hypertension.  She is currently on amlodipine 10 mg daily and hydrochlorothiazide 25 mg.  She has failed lisinopril due to allergy.  Blood pressure was elevated 131/96.  She does continue to smoke.  She does have a history of CVA. She does monitored BP at home and it is doing well. She reports that her husband passed in Mar 30, 2023 this was unexpected.  She does have support of her family and friends.  She was having some difficulty sleeping however this has improved over time.  She also reports that her appetite has improved her weight is down to 139.  Currently 144 pounds.   Past Medical History:  Diagnosis Date   HIV (human immunodeficiency virus infection) ([place])    Hypertension    Seizures ([place])    Stroke ([place])    Vision abnormalities     Past Surgical History:  Procedure Laterality Date   LOOP RECORDER INSERTION N/A 03/29/2019   Procedure: LOOP RECORDER INSERTION;  Surgeon: Constance Haw, MD;  Location: [facility];  Service: Cardiovascular;  Laterality: N/A;    Family History  Problem Relation Age of Onset   Hypertension Mother     Social History   Socioeconomic History   Marital status: Single    Spouse name: Not on file   Number of children: Not on file   Years of education: Not on file   Highest education level: Not on file  Occupational History   Not on file   Tobacco Use   Smoking status: Every Day    Packs/day: 0.50    Types: Cigarettes    Start date: 10/17/2015    Last attempt to quit: 08/10/2019    Years since quitting: 1.9   Smokeless tobacco: Never   Tobacco comments:    picked up smoking after her husband died Mar 29, 2021  Vaping Use   Vaping Use: Never used  Substance and Sexual Activity   Alcohol use: Yes    Alcohol/week: 60.0 standard drinks    Types: 60 Standard drinks or equivalent per week    Comment: "stayed drunk" after her husband passed unexpectedly 03-30-2023, is slowing down   Drug use: No   Sexual activity: Not Currently    Partners: Male    Birth control/protection: Condom    Comment: offered condoms  Other Topics Concern   Not on file  Social History Narrative   Not on file   Social Determinants of Health   Financial Resource Strain: Not on file  Food Insecurity: Not on file  Transportation Needs: Not on file  Physical Activity: Not on file  Stress: Not on file  Social Connections: Not on file  Intimate Partner Violence: Not on file    Outpatient Medications Prior to Visit  Medication Sig Dispense Refill   albuterol (PROVENTIL HFA;VENTOLIN HFA) 108 (90 Base) MCG/ACT inhaler Inhale 2  puffs into the lungs every 4 (four) hours as needed for wheezing or shortness of breath (cough, shortness of breath or wheezing.). 1 Inhaler 6   amLODipine (NORVASC) 10 MG tablet TAKE 1 TABLET BY MOUTH EVERY DAY 90 tablet 3   atorvastatin (LIPITOR) 40 MG tablet TAKE 1 TABLET BY MOUTH DAILY AT 6 PM. 90 tablet 1   cetirizine (ZYRTEC) 10 MG tablet TAKE 1 TABLET BY MOUTH EVERY DAY 90 tablet 3   clopidogrel (PLAVIX) 75 MG tablet Take 1 tablet (75 mg total) by mouth daily. 90 tablet 3   [facility] B-12 500 MCG tablet TAKE 1 TABLET BY MOUTH DAILY 100 tablet 3   dolutegravir (TIVICAY) 50 MG tablet Take 1 tablet (50 mg total) by mouth daily. 30 tablet 2   emtricitabine-tenofovir AF (DESCOVY) 200-25 MG tablet Take 1 tablet by mouth daily. 30 tablet 2    fluticasone (FLONASE) 50 MCG/ACT nasal spray SPRAY 2 SPRAYS INTO EACH NOSTRIL EVERY DAY 16 mL 1   hydrochlorothiazide (HYDRODIURIL) 25 MG tablet Take 1 tablet (25 mg total) by mouth daily. 30 tablet 3   PARoxetine (PAXIL) 30 MG tablet TAKE 1 TABLET BY MOUTH EVERY DAY (Patient taking differently: Take 30 mg by mouth daily.) 90 tablet 4   traZODone (DESYREL) 50 MG tablet TAKE 1/2 TABLETS BY MOUTH AT BEDTIME AS NEEDED FOR SLEEP. 30 tablet 5   Vitamin D, Ergocalciferol, (DRISDOL) 1.25 MG (50000 UNIT) CAPS capsule TAKE 1 CAPSULE BY MOUTH ONE TIME PER WEEK 8 capsule 1   cimetidine (TAGAMET) 200 MG tablet Take 1 tablet (200 mg total) by mouth 2 (two) times daily. 60 tablet 2   No facility-administered medications prior to visit.    Allergies  Allergen Reactions   Lisinopril Swelling    ROS Review of Systems  Cardiovascular:  Negative for leg swelling.  Skin:  Positive for color change and rash.     Objective:    Physical Exam HENT:     Head: Normocephalic and atraumatic.     Nose: Nose normal.  Cardiovascular:     Rate and Rhythm: Normal rate and regular rhythm.     Pulses: Normal pulses.     Heart sounds: Normal heart sounds.  Pulmonary:     Effort: Pulmonary effort is normal.     Breath sounds: Normal breath sounds.  Abdominal:     Palpations: Abdomen is soft.  Musculoskeletal:        General: Normal range of motion.     Cervical back: Normal range of motion.  Skin:    General: Skin is warm.     Capillary Refill: Capillary refill takes less than 2 seconds.     Findings: Rash (Right hand and between fingers 3 and 4) present.     Comments: Slight change in pigmentation lower extremities  Neurological:     General: No focal deficit present.     Mental Status: She is alert and oriented to person, place, and time.  Psychiatric:        Mood and Affect: Mood normal.        Behavior: Behavior normal.        Thought Content: Thought content normal.        Judgment: Judgment  normal.    BP (!) 131/96 (BP Location: Right Arm, Patient Position: Sitting)   Pulse 77   Temp (!) 97.3 F (36.3 C)   Ht '5\' 3"'$  (1.6 m)   Wt 144 lb 0.6 oz (65.3 kg)   LMP 08/04/2014  SpO2 99%   BMI 25.52 kg/m  Wt Readings from Last 3 Encounters:  08/02/21 144 lb 0.6 oz (65.3 kg)  05/07/21 145 lb (65.8 kg)  02/07/21 159 lb (72.1 kg)     Health Maintenance Due  Topic Date Due   COVID-19 Vaccine (1) Never done   MAMMOGRAM  06/09/2021   INFLUENZA VACCINE  07/16/2021    There are no preventive care reminders to display for this patient.  Lab Results  Component Value Date   TSH 3.152 03/28/2019   Lab Results  Component Value Date   WBC 2.9 (L) 04/17/2021   HGB 15.4 04/17/2021   HCT 44.7 04/17/2021   MCV 90.5 04/17/2021   PLT 176 04/17/2021   Lab Results  Component Value Date   NA 139 04/17/2021   K 3.5 04/17/2021   CO2 30 04/17/2021   GLUCOSE 76 04/17/2021   BUN 12 04/17/2021   CREATININE 0.79 04/17/2021   BILITOT 1.0 04/17/2021   ALKPHOS 89 09/26/2020   AST 32 04/17/2021   ALT 40 (H) 04/17/2021   PROT 7.5 04/17/2021   ALBUMIN 4.0 09/26/2020   CALCIUM 9.6 04/17/2021   ANIONGAP 10 03/28/2019   Lab Results  Component Value Date   CHOL 167 04/17/2021   Lab Results  Component Value Date   HDL 67 04/17/2021   Lab Results  Component Value Date   LDLCALC 83 04/17/2021   Lab Results  Component Value Date   TRIG 79 04/17/2021   Lab Results  Component Value Date   CHOLHDL 2.5 04/17/2021   Lab Results  Component Value Date   HGBA1C 5.2 03/28/2019      Assessment & Plan:   Problem List Items Addressed This Visit       Cardiovascular and Mediastinum   Essential hypertension - Primary Encouraged on going compliance with current medication regimen Encouraged home monitoring and recording BP <130/80 Eating a heart-healthy diet with less salt Encouraged regular physical activity  Discussed smoking cessation      Relevant Orders   Urinalysis  Dipstick (Completed)   Other Visit Diagnoses     Screening mammogram, encounter for       Relevant Orders   MM Digital Screening   Spouse deceased     Patient currently doing well will call with any changes Discussed counseling options if needed   Rash of multiple fingers of both hands    Triamcinolone cream twice daily       Meds ordered this encounter  Medications   Triamcinolone Acetonide (TRIAMCINOLONE 0.1 % CREAM : EUCERIN) CREA    Sig: Apply 1 application topically 2 (two) times daily.    Dispense:  60 each    Refill:  0    Order Specific Question:   Supervising Provider    Answer:   Tresa Garter [id number]    Follow-up: Return in about 3 months (around 11/02/2021).    [name], NP

## 2021-08-02 NOTE — Telephone Encounter (Signed)
Rx sent 

## 2021-08-02 NOTE — Patient Instructions (Addendum)
Managing Your Hypertension Hypertension, also called high blood pressure, is when the force of the blood pressing against the walls of the arteries is too strong. Arteries are blood vessels that carry blood from your heart throughout your body. Hypertension forces the heart to work harder to pump blood and may cause the arteries tobecome narrow or stiff. Understanding blood pressure readings Your personal target blood pressure may vary depending on your medical conditions, your age, and other factors. A blood pressure reading includes a higher number over a lower number. Ideally, your blood pressure should be below 120/80. You should know that: The first, or top, number is called the systolic pressure. It is a measure of the pressure in your arteries as your heart beats. The second, or bottom number, is called the diastolic pressure. It is a measure of the pressure in your arteries as the heart relaxes. Blood pressure is classified into four stages. Based on your blood pressure reading, your health care provider may use the following stages to determine what type of treatment you need, if any. Systolic pressure and diastolicpressure are measured in a unit called mmHg. Normal Systolic pressure: below 120. Diastolic pressure: below 80. Elevated Systolic pressure: 120-129. Diastolic pressure: below 80. Hypertension stage 1 Systolic pressure: 130-139. Diastolic pressure: 80-89. Hypertension stage 2 Systolic pressure: 140 or above. Diastolic pressure: 90 or above. How can this condition affect me? Managing your hypertension is an important responsibility. Over time, hypertension can damage the arteries and decrease blood flow to important parts of the body, including the brain, heart, and kidneys. Having untreated or uncontrolled hypertension can lead to: A heart attack. A stroke. A weakened blood vessel (aneurysm). Heart failure. Kidney damage. Eye damage. Metabolic syndrome. Memory and  concentration problems. Vascular dementia. What actions can I take to manage this condition? Hypertension can be managed by making lifestyle changes and possibly by taking medicines. Your health care provider will help you make a plan to bring yourblood pressure within a normal range. Nutrition  Eat a diet that is high in fiber and potassium, and low in salt (sodium), added sugar, and fat. An example eating plan is called the Dietary Approaches to Stop Hypertension (DASH) diet. To eat this way: Eat plenty of fresh fruits and vegetables. Try to fill one-half of your plate at each meal with fruits and vegetables. Eat whole grains, such as whole-wheat pasta, brown rice, or whole-grain bread. Fill about one-fourth of your plate with whole grains. Eat low-fat dairy products. Avoid fatty cuts of meat, processed or cured meats, and poultry with skin. Fill about one-fourth of your plate with lean proteins such as fish, chicken without skin, beans, eggs, and tofu. Avoid pre-made and processed foods. These tend to be higher in sodium, added sugar, and fat. Reduce your daily sodium intake. Most people with hypertension should eat less than 1,500 mg of sodium a day.  Lifestyle  Work with your health care provider to maintain a healthy body weight or to lose weight. Ask what an ideal weight is for you. Get at least 30 minutes of exercise that causes your heart to beat faster (aerobic exercise) most days of the week. Activities may include walking, swimming, or biking. Include exercise to strengthen your muscles (resistance exercise), such as weight lifting, as part of your weekly exercise routine. Try to do these types of exercises for 30 minutes at least 3 days a week. Do not use any products that contain nicotine or tobacco, such as cigarettes, e-cigarettes, and chewing   tobacco. If you need help quitting, ask your health care provider. Control any long-term (chronic) conditions you have, such as high  cholesterol or diabetes. Identify your sources of stress and find ways to manage stress. This may include meditation, deep breathing, or making time for fun activities.  Alcohol use Do not drink alcohol if: Your health care provider tells you not to drink. You are pregnant, may be pregnant, or are planning to become pregnant. If you drink alcohol: Limit how much you use to: 0-1 drink a day for women. 0-2 drinks a day for men. Be aware of how much alcohol is in your drink. In the [place] one drink equals one 12 oz bottle of beer (355 mL), one 5 oz glass of wine (148 mL), or one 1 oz glass of hard liquor (44 mL). Medicines Your health care provider may prescribe medicine if lifestyle changes are not enough to get your blood pressure under control and if: Your systolic blood pressure is 130 or higher. Your diastolic blood pressure is 80 or higher. Take medicines only as told by your health care provider. Follow the directions carefully. Blood pressure medicines must be taken as told by your health care provider. The medicine does not work as well when you skip doses. Skippingdoses also puts you at risk for problems. Monitoring Before you monitor your blood pressure: Do not smoke, drink caffeinated beverages, or exercise within 30 minutes before taking a measurement. Use the bathroom and empty your bladder (urinate). Sit quietly for at least 5 minutes before taking measurements. Monitor your blood pressure at home as told by your health care provider. To do this: Sit with your back straight and supported. Place your feet flat on the floor. Do not cross your legs. Support your arm on a flat surface, such as a table. Make sure your upper arm is at heart level. Each time you measure, take two or three readings one minute apart and record the results. You may also need to have your blood pressure checked regularly by your healthcare provider. General information Talk with your health care  provider about your diet, exercise habits, and other lifestyle factors that may be contributing to hypertension. Review all the medicines you take with your health care provider because there may be side effects or interactions. Keep all visits as told by your health care provider. Your health care provider can help you create and adjust your plan for managing your high blood pressure. Where to find more information National Heart, Lung, and Blood Institute: www.nhlbi.nih.gov American Heart Association: www.heart.org Contact a health care provider if: You think you are having a reaction to medicines you have taken. You have repeated (recurrent) headaches. You feel dizzy. You have swelling in your ankles. You have trouble with your vision. Get help right away if: You develop a severe headache or confusion. You have unusual weakness or numbness, or you feel faint. You have severe pain in your chest or abdomen. You vomit repeatedly. You have trouble breathing. These symptoms may represent a serious problem that is an emergency. Do not wait to see if the symptoms will go away. Get medical help right away. Call your local emergency services (911 in the [place] Do not drive yourself to the hospital. Summary Hypertension is when the force of blood pumping through your arteries is too strong. If this condition is not controlled, it may put you at risk for serious complications. Your personal target blood pressure may vary depending on your medical conditions,   your age, and other factors. For most people, a normal blood pressure is less than 120/80. Hypertension is managed by lifestyle changes, medicines, or both. Lifestyle changes to help manage hypertension include losing weight, eating a healthy, low-sodium diet, exercising more, stopping smoking, and limiting alcohol. This information is not intended to replace advice given to you by your health care provider. Make sure you discuss any questions  you have with your healthcare provider. Document Revised: 01/07/2020 Document Reviewed: 11/02/2019 Elsevier Patient Education  2022 [place].   Steps to Quit Smoking Smoking tobacco is the leading cause of preventable death. It can affect almost every organ in the body. Smoking puts you and people around you at risk for many serious, long-lasting (chronic) diseases. Quitting smoking can be hard, but it is one of the best things thatyou can do for your health. It is never too late to quit. How do I get ready to quit? When you decide to quit smoking, make a plan to help you succeed. Before you quit: Pick a date to quit. Set a date within the next 2 weeks to give you time to prepare. Write down the reasons why you are quitting. Keep this list in places where you will see it often. Tell your family, friends, and co-workers that you are quitting. Their support is important. Talk with your doctor about the choices that may help you quit. Find out if your health insurance will pay for these treatments. Know the people, places, things, and activities that make you want to smoke (triggers). Avoid them. What first steps can I take to quit smoking? Throw away all cigarettes at home, at work, and in your car. Throw away the things that you use when you smoke, such as ashtrays and lighters. Clean your car. Make sure to empty the ashtray. Clean your home, including curtains and carpets. What can I do to help me quit smoking? Talk with your doctor about taking medicines and seeing a counselor at the same time. You are more likely to succeed when you do both. If you are pregnant or breastfeeding, talk with your doctor about counseling or other ways to quit smoking. Do not take medicine to help you quit smoking unless your doctor tells you to do so. To quit smoking: Quit right away Quit smoking totally, instead of slowly cutting back on how much you smoke over a period of time. Go to counseling. You  are more likely to quit if you go to counseling sessions regularly. Take medicine You may take medicines to help you quit. Some medicines need a prescription, and some you can buy over-the-counter. Some medicines may contain a drug called nicotine to replace the nicotine in cigarettes. Medicines may: Help you to stop having the desire to smoke (cravings). Help to stop the problems that come when you stop smoking (withdrawal symptoms). Your doctor may ask you to use: Nicotine patches, gum, or lozenges. Nicotine inhalers or sprays. Non-nicotine medicine that is taken by mouth. Find resources Find resources and other ways to help you quit smoking and remain smoke-free after you quit. These resources are most helpful when you use them often. They include: Online chats with a Social worker. Phone quitlines. Printed Furniture conservator/restorer. Support groups or group counseling. Text messaging programs. Mobile phone apps. Use apps on your mobile phone or tablet that can help you stick to your quit plan. There are many free apps for mobile phones and tablets as well as websites. Examples include Quit Guide from the  CDC and smokefree.gov  What things can I do to make it easier to quit?  Talk to your family and friends. Ask them to support and encourage you. Call a phone quitline (1-800-QUIT-NOW), reach out to support groups, or work with a Social worker. Ask people who smoke to not smoke around you. Avoid places that make you want to smoke, such as: Bars. Parties. Smoke-break areas at work. Spend time with people who do not smoke. Lower the stress in your life. Stress can make you want to smoke. Try these things to help your stress: Getting regular exercise. Doing deep-breathing exercises. Doing yoga. Meditating. Doing a body scan. To do this, close your eyes, focus on one area of your body at a time from head to toe. Notice which parts of your body are tense. Try to relax the muscles in those areas. How  will I feel when I quit smoking? Day 1 to 3 weeks Within the first 24 hours, you may start to have some problems that come from quitting tobacco. These problems are very bad 2-3 days after you quit, but they do not often last for more than 2-3 weeks. You may get these symptoms: Mood swings. Feeling restless, nervous, angry, or annoyed. Trouble concentrating. Dizziness. Strong desire for high-sugar foods and nicotine. Weight gain. Trouble pooping (constipation). Feeling like you may vomit (nausea). Coughing or a sore throat. Changes in how the medicines that you take for other issues work in your body. Depression. Trouble sleeping (insomnia). Week 3 and afterward After the first 2-3 weeks of quitting, you may start to notice more positive results, such as: Better sense of smell and taste. Less coughing and sore throat. Slower heart rate. Lower blood pressure. Clearer skin. Better breathing. Fewer sick days. Quitting smoking can be hard. Do not give up if you fail the first time. Some people need to try a few times before they succeed. Do your best to stick to your quit plan, and talk with yourdoctor if you have any questions or concerns. Summary Smoking tobacco is the leading cause of preventable death. Quitting smoking can be hard, but it is one of the best things that you can do for your health. When you decide to quit smoking, make a plan to help you succeed. Quit smoking right away, not slowly over a period of time. When you start quitting, seek help from your doctor, family, or friends. This information is not intended to replace advice given to you by your health care provider. Make sure you discuss any questions you have with your healthcare provider. Document Revised: 08/27/2019 Document Reviewed: 02/20/2019 Elsevier Patient Education  [place].

## 2021-08-23 ENCOUNTER — Other Ambulatory Visit: Payer: Self-pay | Admitting: Internal Medicine

## 2021-08-23 DIAGNOSIS — B20 Human immunodeficiency virus [HIV] disease: Secondary | ICD-10-CM

## 2021-08-23 MED ORDER — DESCOVY 200-25 MG PO TABS
1.0000 | ORAL_TABLET | Freq: Every day | ORAL | 1 refills | Status: DC
Start: 2021-08-23 — End: 2021-11-26

## 2021-09-05 ENCOUNTER — Telehealth: Payer: Self-pay | Admitting: Nurse Practitioner

## 2021-09-05 NOTE — Telephone Encounter (Signed)
Left message for patient to call back and schedule Medicare Annual Wellness Visit (AWV)   [facility] 02/14/19 per [facility] please schedule at anytime with health coach

## 2021-09-06 ENCOUNTER — Emergency Department (HOSPITAL_COMMUNITY): Payer: Medicare Other

## 2021-09-06 ENCOUNTER — Emergency Department (HOSPITAL_COMMUNITY)
Admission: EM | Admit: 2021-09-06 | Discharge: 2021-09-06 | Discharge status: Against Medical Advice | Payer: Medicare Other

## 2021-09-06 ENCOUNTER — Observation Stay (HOSPITAL_COMMUNITY)
Admission: EM | Admit: 2021-09-06 | Discharge: 2021-09-08 | Disposition: A | Discharge status: Home / Self Care | Payer: Medicare Other | Attending: Internal Medicine | Admitting: Internal Medicine

## 2021-09-06 ENCOUNTER — Observation Stay (HOSPITAL_COMMUNITY): Payer: Medicare Other

## 2021-09-06 ENCOUNTER — Encounter (HOSPITAL_COMMUNITY): Payer: Self-pay

## 2021-09-06 ENCOUNTER — Other Ambulatory Visit: Payer: Self-pay

## 2021-09-06 DIAGNOSIS — I10 Essential (primary) hypertension: Secondary | ICD-10-CM | POA: Diagnosis not present

## 2021-09-06 DIAGNOSIS — B20 Human immunodeficiency virus [HIV] disease: Secondary | ICD-10-CM | POA: Diagnosis not present

## 2021-09-06 DIAGNOSIS — F17210 Nicotine dependence, cigarettes, uncomplicated: Secondary | ICD-10-CM | POA: Diagnosis not present

## 2021-09-06 DIAGNOSIS — Z20822 Contact with and (suspected) exposure to covid-19: Secondary | ICD-10-CM | POA: Insufficient documentation

## 2021-09-06 DIAGNOSIS — R200 Anesthesia of skin: Secondary | ICD-10-CM | POA: Diagnosis present

## 2021-09-06 DIAGNOSIS — I6529 Occlusion and stenosis of unspecified carotid artery: Secondary | ICD-10-CM | POA: Diagnosis not present

## 2021-09-06 DIAGNOSIS — I712 Thoracic aortic aneurysm, without rupture, unspecified: Secondary | ICD-10-CM | POA: Diagnosis present

## 2021-09-06 DIAGNOSIS — G459 Transient cerebral ischemic attack, unspecified: Secondary | ICD-10-CM | POA: Diagnosis not present

## 2021-09-06 DIAGNOSIS — Z8673 Personal history of transient ischemic attack (TIA), and cerebral infarction without residual deficits: Secondary | ICD-10-CM

## 2021-09-06 DIAGNOSIS — R531 Weakness: Secondary | ICD-10-CM

## 2021-09-06 DIAGNOSIS — I639 Cerebral infarction, unspecified: Secondary | ICD-10-CM | POA: Diagnosis not present

## 2021-09-06 DIAGNOSIS — Z79899 Other long term (current) drug therapy: Secondary | ICD-10-CM | POA: Insufficient documentation

## 2021-09-06 DIAGNOSIS — D72819 Decreased white blood cell count, unspecified: Secondary | ICD-10-CM

## 2021-09-06 DIAGNOSIS — G319 Degenerative disease of nervous system, unspecified: Secondary | ICD-10-CM | POA: Diagnosis not present

## 2021-09-06 DIAGNOSIS — I6782 Cerebral ischemia: Secondary | ICD-10-CM | POA: Diagnosis not present

## 2021-09-06 DIAGNOSIS — R202 Paresthesia of skin: Secondary | ICD-10-CM | POA: Diagnosis present

## 2021-09-06 DIAGNOSIS — Y900 Blood alcohol level of less than 20 mg/100 ml: Secondary | ICD-10-CM | POA: Insufficient documentation

## 2021-09-06 LAB — RESP PANEL BY RT-PCR (FLU A&B, COVID) ARPGX2
Influenza A by PCR: NEGATIVE
Influenza B by PCR: NEGATIVE
SARS Coronavirus 2 by RT PCR: NEGATIVE

## 2021-09-06 LAB — COMPREHENSIVE METABOLIC PANEL
ALT: 48 U/L — ABNORMAL HIGH (ref 0–44)
AST: 42 U/L — ABNORMAL HIGH (ref 15–41)
Albumin: 4.6 g/dL (ref 3.5–5.0)
Alkaline Phosphatase: 84 U/L (ref 38–126)
Anion gap: 13 (ref 5–15)
BUN: 15 mg/dL (ref 6–20)
CO2: 28 mmol/L (ref 22–32)
Calcium: 10.2 mg/dL (ref 8.9–10.3)
Chloride: 104 mmol/L (ref 98–111)
Creatinine, Ser: 0.87 mg/dL (ref 0.44–1.00)
GFR, Estimated: >60 mL/min (ref >60)
Glucose, Bld: 98 mg/dL (ref 70–99)
Potassium: 3.4 mmol/L — ABNORMAL LOW (ref 3.5–5.1)
Sodium: 145 mmol/L (ref 135–145)
Total Bilirubin: 1.6 mg/dL — ABNORMAL HIGH (ref 0.3–1.2)
Total Protein: 8.9 g/dL — ABNORMAL HIGH (ref 6.5–8.1)

## 2021-09-06 LAB — CBC
HCT: 45.4 % (ref 36.0–46.0)
Hemoglobin: 15.4 g/dL — ABNORMAL HIGH (ref 12.0–15.0)
MCH: 31.4 pg (ref 26.0–34.0)
MCHC: 33.9 g/dL (ref 30.0–36.0)
MCV: 92.5 fL (ref 80.0–100.0)
Platelets: 173 10*3/uL (ref 150–400)
RBC: 4.91 MIL/uL (ref 3.87–5.11)
RDW: 13.4 % (ref 11.5–15.5)
WBC: 3.3 10*3/uL — ABNORMAL LOW (ref 4.0–10.5)
nRBC: 0 % (ref 0.0–0.2)

## 2021-09-06 LAB — RAPID URINE DRUG SCREEN, HOSP PERFORMED
Amphetamines: NOT DETECTED
Barbiturates: NOT DETECTED
Benzodiazepines: NOT DETECTED
Cocaine: NOT DETECTED
Opiates: NOT DETECTED
Tetrahydrocannabinol: NOT DETECTED

## 2021-09-06 LAB — DIFFERENTIAL
Abs Immature Granulocytes: 0.01 10*3/uL (ref 0.00–0.07)
Basophils Absolute: 0 10*3/uL (ref 0.0–0.1)
Basophils Relative: 0 %
Eosinophils Absolute: 0.1 10*3/uL (ref 0.0–0.5)
Eosinophils Relative: 3 %
Immature Granulocytes: 0 %
Lymphocytes Relative: 40 %
Lymphs Abs: 1.3 10*3/uL (ref 0.7–4.0)
Monocytes Absolute: 0.4 10*3/uL (ref 0.1–1.0)
Monocytes Relative: 12 %
Neutro Abs: 1.5 10*3/uL — ABNORMAL LOW (ref 1.7–7.7)
Neutrophils Relative %: 45 %

## 2021-09-06 LAB — URINALYSIS, ROUTINE W REFLEX MICROSCOPIC
Bilirubin Urine: NEGATIVE
Glucose, UA: NEGATIVE mg/dL
Hgb urine dipstick: NEGATIVE
Ketones, ur: NEGATIVE mg/dL
Leukocytes,Ua: NEGATIVE
Nitrite: NEGATIVE
Protein, ur: NEGATIVE mg/dL
Specific Gravity, Urine: 1.015 (ref 1.005–1.030)
pH: 7.5 (ref 5.0–8.0)

## 2021-09-06 LAB — I-STAT BETA HCG BLOOD, ED (MC, WL, AP ONLY): I-stat hCG, quantitative: <5 m[IU]/mL (ref <5)

## 2021-09-06 LAB — PROTIME-INR
INR: 0.9 (ref 0.8–1.2)
Prothrombin Time: 12.1 seconds (ref 11.4–15.2)

## 2021-09-06 LAB — I-STAT CHEM 8, ED
BUN: 13 mg/dL (ref 6–20)
Calcium, Ion: 1.2 mmol/L (ref 1.15–1.40)
Chloride: 102 mmol/L (ref 98–111)
Creatinine, Ser: 0.8 mg/dL (ref 0.44–1.00)
Glucose, Bld: 92 mg/dL (ref 70–99)
HCT: 48 % — ABNORMAL HIGH (ref 36.0–46.0)
Hemoglobin: 16.3 g/dL — ABNORMAL HIGH (ref 12.0–15.0)
Potassium: 3.3 mmol/L — ABNORMAL LOW (ref 3.5–5.1)
Sodium: 139 mmol/L (ref 135–145)
TCO2: 28 mmol/L (ref 22–32)

## 2021-09-06 LAB — CBG MONITORING, ED: Glucose-Capillary: 92 mg/dL (ref 70–99)

## 2021-09-06 LAB — ETHANOL: Alcohol, Ethyl (B): <10 mg/dL (ref <10)

## 2021-09-06 LAB — APTT: aPTT: 24 seconds (ref 24–36)

## 2021-09-06 MED ORDER — ACETAMINOPHEN 650 MG RE SUPP: 650.0000 mg | RECTAL | Status: DC | PRN

## 2021-09-06 MED ORDER — ENOXAPARIN SODIUM 40 MG/0.4ML IJ SOSY
40.0000 mg | PREFILLED_SYRINGE | INTRAMUSCULAR | Status: DC
  Administered 2021-09-07 (×2): 40 mg via SUBCUTANEOUS
  Filled 2021-09-06 (×2): qty 0.4

## 2021-09-06 MED ORDER — GADOBUTROL 1 MMOL/ML IV SOLN
6.5000 mL | Freq: Once | INTRAVENOUS | Status: AC | PRN
  Administered 2021-09-06: 6.5 mL via INTRAVENOUS

## 2021-09-06 MED ORDER — STROKE: EARLY STAGES OF RECOVERY BOOK
Freq: Once | Status: DC
  Filled 2021-09-06: qty 1

## 2021-09-06 MED ORDER — LORAZEPAM 2 MG/ML IJ SOLN: 0.5000 mg | Freq: Once | INTRAMUSCULAR | Status: DC

## 2021-09-06 MED ORDER — ACETAMINOPHEN 160 MG/5ML PO SOLN: 650.0000 mg | ORAL | Status: DC | PRN

## 2021-09-06 MED ORDER — ACETAMINOPHEN 325 MG PO TABS: 650.0000 mg | ORAL_TABLET | ORAL | Status: DC | PRN

## 2021-09-06 MED ORDER — LORAZEPAM 2 MG/ML IJ SOLN
0.5000 mg | Freq: Once | INTRAMUSCULAR | Status: AC
  Administered 2021-09-06: 0.5 mg via INTRAVENOUS
  Filled 2021-09-06: qty 1

## 2021-09-06 MED ORDER — SENNOSIDES-DOCUSATE SODIUM 8.6-50 MG PO TABS: 1.0000 | ORAL_TABLET | Freq: Every evening | ORAL | Status: DC | PRN

## 2021-09-06 NOTE — ED Notes (Signed)
Patient returned from CT

## 2021-09-06 NOTE — ED Notes (Signed)
MRI states they are on the way to retrieve pt for MRI scans.

## 2021-09-06 NOTE — ED Notes (Signed)
Pt family member stated she was taking her on over to [facility]. Informed pt that it was important for them to wait but pt stated the wait is too long

## 2021-09-06 NOTE — ED Triage Notes (Signed)
Pt complains of numbness and tingling to her face, left leg, and hand since today. Pt reports having a hx of stroke.

## 2021-09-06 NOTE — ED Provider Notes (Signed)
[facility] DEPT Provider Note   CSN: [id number] Arrival date & time: 09/06/21  5188  An emergency department physician performed an initial assessment on this suspected stroke patient at 2021.  History Chief Complaint  Patient presents with   Numbness    Abigail Medina is a 55 y.o. female.  Abigail Medina has a history of CVA in April 2020.  She presents with complaints of numbness of her left side of her face, left arm, and left leg onset at 5 PM.  She is endorsing some weakness of her leg.  The history is provided by the patient.  Cerebrovascular Accident This is a new problem. Episode onset: Last known well 5 pm today. The problem occurs constantly. The problem has been gradually improving. Pertinent negatives include no chest pain, no abdominal pain, no headaches and no shortness of breath. Nothing aggravates the symptoms. Nothing relieves the symptoms. She has tried nothing for the symptoms. The treatment provided no relief.      Past Medical History:  Diagnosis Date   HIV (human immunodeficiency virus infection) ([place])    Hypertension    Seizures ([place])    Stroke [facility])    Vision abnormalities     Patient Active Problem List   Diagnosis Date Noted   Healthcare maintenance 04/13/2019   Aneurysm of thoracic aorta ([place]) 03/29/2019   Cerebral thrombosis with cerebral infarction 03/28/2019   CVA (cerebral vascular accident) ([place]) 03/28/2019   Acute focal neurological deficit 03/27/2019   Hypokalemia 03/27/2019   Hyperbilirubinemia 03/27/2019   History of CVA with residual deficit    Pain of back and left lower extremity 12/15/2018   Upper respiratory infection, viral 12/15/2018   Ulnar neuritis 06/22/2015   Tobacco dependence 06/02/2015   Numbness and tingling of right arm 05/30/2015   Right arm weakness 05/30/2015   Arm numbness left 10/10/2014   Vitamin D deficiency 08/05/2014   Allergic rhinitis 05/25/2013   Nasal septal  perforation 05/25/2013   Polypharmacy 08/20/2012   HIV (human immunodeficiency virus infection) ([place]) 08/20/2012   HLD (hyperlipidemia) 07/15/2012   BP (high blood pressure) 07/15/2012   Cerebrovascular accident, late effects 01/30/2012   Depression with anxiety 07/15/2011   Seborrhea capitis 04/18/2011   Seborrheic dermatitis 03/07/2011   Hemiplegia, 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     Family History  Problem Relation Age of Onset   Hypertension Mother     Social History   Tobacco Use   Smoking status: Every Day    Packs/day: 0.50    Types: Cigarettes    Start date: 10/17/2015    Last attempt to quit: 08/10/2019    Years since quitting: 2.0   Smokeless tobacco: Never   Tobacco comments:    picked up smoking after her husband died 03/11/2021  Vaping Use   Vaping Use: Never used  Substance Use Topics   Alcohol use: Yes    Alcohol/week: 60.0 standard drinks    Types:  60 Standard drinks or equivalent per week    Comment: "stayed drunk" after her husband passed unexpectedly 3/19, is slowing down   Drug use: No    Home Medications Prior to Admission medications   Medication Sig Start Date End Date Taking? Authorizing Provider  albuterol (PROVENTIL HFA;VENTOLIN HFA) 108 (90 Base) MCG/ACT inhaler Inhale 2 puffs into the lungs every 4 (four) hours as needed for wheezing or shortness of breath (cough, shortness of breath or wheezing.). 12/15/18   [place] Callas, NP  amLODipine (NORVASC) 10 MG tablet TAKE 1 TABLET BY MOUTH EVERY DAY 04/05/21   [name], NP  atorvastatin (LIPITOR) 40 MG tablet TAKE 1 TABLET BY MOUTH DAILY AT 6 PM. 07/20/21   [name], NP  cetirizine (ZYRTEC) 10 MG tablet TAKE 1 TABLET BY MOUTH EVERY DAY 01/03/21   Carlyle Basques, MD  cimetidine (TAGAMET) 200 MG tablet Take 1 tablet (200 mg total) by mouth 2 (two) times daily. 01/19/20 10/18/20  [name], NP  [employer] B-12 500 MCG tablet TAKE 1 TABLET BY MOUTH DAILY 06/13/21   [name], NP  dolutegravir (TIVICAY) 50 MG tablet TAKE 1 TABLET BY MOUTH EVERY DAY 08/23/21   Carlyle Basques, MD  emtricitabine-tenofovir AF (DESCOVY) 200-25 MG tablet Take 1 tablet by mouth daily. 08/23/21   Carlyle Basques, MD  fluticasone [facility]) 50 MCG/ACT nasal spray SPRAY 2 SPRAYS INTO EACH NOSTRIL EVERY DAY 06/20/21   Carlyle Basques, MD  hydrochlorothiazide (HYDRODIURIL) 25 MG tablet Take 1 tablet (25 mg total) by mouth daily. 07/23/21   [name], NP  PARoxetine (PAXIL) 30 MG tablet TAKE 1 TABLET BY MOUTH EVERY DAY Patient taking differently: Take 30 mg by mouth daily. 02/15/19   Carlyle Basques, MD  traZODone (DESYREL) 50 MG tablet TAKE 1/2 TABLETS BY MOUTH AT BEDTIME AS NEEDED FOR SLEEP. 06/21/21   [name], NP  Vitamin D, Ergocalciferol, (DRISDOL) 1.25 MG (50000 UNIT) CAPS capsule TAKE 1 CAPSULE BY MOUTH ONE TIME PER WEEK 06/19/21   [name], NP    Allergies    Lisinopril  Review of Systems   Review of Systems  Constitutional:  Negative for chills and fever.  HENT:  Negative for ear pain and sore throat.   Eyes:  Negative for pain and visual disturbance.  Respiratory:  Negative for cough and shortness of breath.   Cardiovascular:  Negative for chest pain and palpitations.  Gastrointestinal:  Negative for abdominal pain and vomiting.  Genitourinary:  Negative for dysuria and hematuria.  Musculoskeletal:  Negative for arthralgias and back pain.  Skin:  Negative  for color change and rash.  Neurological:  Negative for seizures, syncope and headaches.  All other systems reviewed and are negative.  Physical Exam Updated Vital Signs BP (!) 131/103 (BP Location: Right Arm)   Pulse 77   Temp 98.2 F (36.8 C) (Oral)   Resp 16   LMP 08/04/2014   SpO2 99%   Physical Exam Vitals and nursing note reviewed.  HENT:     Head: Normocephalic and atraumatic.  Eyes:     General: No scleral icterus. Pulmonary:     Effort: Pulmonary effort is normal. No respiratory distress.  Musculoskeletal:     Cervical back: Normal range of motion.  Skin:    General: Skin is warm and dry.  Neurological:     Mental Status: She is alert.     Coordination: Coordination is intact. Finger-Nose-Finger Test and Heel to [employer]  normal.     Comments: She endorses change in sensation to light touch over her left face.  He has a left-sided facial droop noted in her mouth which is chronic per her.  She has decreased sensation to light touch over the left arm and left leg.  Motor function intact with the exception of her left leg which falls to the bed immediately.  Psychiatric:        Mood and Affect: Mood normal.    ED Results / Procedures / Treatments   Labs (all labs ordered are listed, but only abnormal results are displayed) Labs Reviewed  CBC - Abnormal; Notable for the following components:      Result Value   WBC 3.3 (*)    Hemoglobin 15.4 (*)    All other components within normal limits  DIFFERENTIAL - Abnormal; Notable for the following components:   Neutro Abs 1.5 (*)    All other components within normal limits  COMPREHENSIVE METABOLIC PANEL - Abnormal; Notable for the following components:   Potassium 3.4 (*)    Total Protein 8.9 (*)    AST 42 (*)    ALT 48 (*)    Total Bilirubin 1.6 (*)    All other components within normal limits  I-STAT CHEM 8, ED - Abnormal; Notable for the following components:   Potassium 3.3 (*)    Hemoglobin 16.3 (*)    HCT  48.0 (*)    All other components within normal limits  RESP PANEL BY RT-PCR (FLU A&B, COVID) ARPGX2  ETHANOL  PROTIME-INR  APTT  RAPID URINE DRUG SCREEN, HOSP PERFORMED  URINALYSIS, ROUTINE W REFLEX MICROSCOPIC  I-STAT BETA HCG BLOOD, ED ([facility], [facility], [facility] ONLY)  CBG MONITORING, ED    EKG EKG Interpretation  Date/Time:  Thursday September 06 2021 20:01:10 EDT Ventricular Rate:  61 PR Interval:  155 QRS Duration: 105 QT Interval:  453 QTC Calculation: 457 R Axis:   -35 Text Interpretation: Sinus rhythm Incomplete RBBB and LAFB Low voltage, precordial leads Consider right ventricular hypertrophy nonspecific T wave changes (flattening) especially in the lateral V leads no acute ischemia Confirmed by Lorre Munroe ([id number]) on 09/06/2021 8:31:58 PM  Radiology CT HEAD CODE STROKE WO CONTRAST  Result Date: 09/06/2021 CLINICAL DATA:  Code stroke. Initial evaluation for facial numbness and tingling as well as within the left leg and hand. EXAM: CT HEAD WITHOUT CONTRAST TECHNIQUE: Contiguous axial images were obtained from the base of the skull through the vertex without intravenous contrast. COMPARISON:  Prior study from 03/28/2019 FINDINGS: Brain: Age-related cerebral atrophy with chronic microvascular ischemic disease. Multiple remote cortical/subcortical infarcts noted involving the right frontal, left frontal, and left parietal lobes. Small remote right cerebellar infarct. No acute intracranial hemorrhage. No acute large vessel territory infarct. No mass lesion, midline shift or mass effect. No hydrocephalus or extra-axial fluid collection. Vascular: No hyperdense vessel. Scattered vascular calcifications noted within the carotid siphons. Skull: Scalp soft tissues faint calvarium within normal limits. Sinuses/Orbits: Globes and orbital soft tissues demonstrate no acute finding. Mild mucoperiosteal thickening noted within the ethmoidal air cells. Paranasal sinuses are otherwise clear. No mastoid effusion.  Other: None. ASPECTS [facility] Stroke Program Early CT Score) - Ganglionic level infarction (caudate, lentiform nuclei, internal capsule, insula, M1-M3 cortex): 7 - Supraganglionic infarction (M4-M6 cortex): 3 Total score (0-10 with 10 being normal): 10 IMPRESSION: 1. No acute intracranial abnormality. 2. ASPECTS is 10. 3. Multiple chronic ischemic infarcts involving the bilateral frontal lobes, left parietal lobe, and right  cerebellum. Results were called by telephone at the time of interpretation on 09/06/2021 at 8:44 pm to provider [facility] , who verbally acknowledged these results. Electronically Signed   By: Jeannine Boga M.D.   On: 09/06/2021 20:47    Procedures .Critical Care Performed by: Arnaldo Natal, MD Authorized by: Arnaldo Natal, MD   Critical care provider statement:    Critical care time (minutes):  30   Critical care time was exclusive of:  Separately billable procedures and treating other patients and teaching time   Critical care was necessary to treat or prevent imminent or life-threatening deterioration of the following conditions:  CNS failure or compromise   Critical care was time spent personally by me on the following activities:  Development of treatment plan with patient or surrogate, discussions with consultants, evaluation of patient's response to treatment, examination of patient, obtaining history from patient or surrogate, ordering and performing treatments and interventions, ordering and review of laboratory studies, ordering and review of radiographic studies and review of old charts   I assumed direction of critical care for this patient from another provider in my specialty: no     Care discussed with: admitting provider     Medications Ordered in ED Medications - No data to display  ED Course  I have reviewed the triage vital signs and the nursing notes.  Pertinent labs & imaging results that were available during my care of the patient were  reviewed by me and considered in my medical decision making (see chart for details).  Clinical Course as of 09/06/21 2156  Thu Sep 06, 2021  2055 Tele-neuro MD states that patient, upon further questioning, recalls her symptoms started at 3 pm. Outside TPA window. NIHSS 3. No LVO symptoms. Recommends admission.  [AW]  2155 I spoke with Dr. Flossie Buffy who will admit the patient. [AW]    Clinical Course User Index [AW] Arnaldo Natal, MD   MDM Rules/Calculators/A&P                           Ree Shay presents with acute onset neurologic symptoms.  Code stroke was activated secondary to initial history of the timing of onset of symptoms being within the code stroke window.  However, she was actually outside the code stroke window and had a fairly low NIH stroke scale.  No evidence of acute bleed.  Neuro recommended to get CT angiogram but did recommend that the patient be admitted for further evaluation.  It looks like in the past she has been thought to be having embolic strokes but did wear a loop recorder for quite some time which was not significant for atrial fibrillation. Final Clinical Impression(s) / ED Diagnoses Final diagnoses:  Cerebrovascular accident (CVA), unspecified mechanism ([place])  Human immunodeficiency virus (HIV) disease ([place])  Essential hypertension    Rx / DC Orders ED Discharge Orders     None        Arnaldo Natal, MD 09/06/21 2158

## 2021-09-06 NOTE — H&P (Addendum)
History and Physical    Abigail Medina OFB:[id number] DOB: 14-May-1966 DOA: 09/06/2021  PCP: [name], NP  Patient coming from: Home  I have personally briefly reviewed patient's old medical records in [place]  Chief Complaint: Left-sided paresthesia and weakness  HPI: Abigail Medina is a 55 y.o. female with medical history significant for HIV, recurrent CVA, thoracic aortic aneurysm, HTN, and hyperlipidemia who presents with concerns of left-sided numbness and weakness.   She had history of CVA with residual left-sided hemiparesis in 2012 recurrent stroke back in 2020 and had a loop recorder placement by cardiology but believes no arrhythmia was ever found.  Around 3 PM today patient was sending her granddaughter out the door when she suddenly noticed worsening numbness around her mouth, increasing weakness of her left leg and feeling like she was going to fall.  Also felt more pain to her left hand.  Patient has residual left hemiparesis from a stroke back in 2012 but feels symptoms worsened today.  She waited for 2 hours with no improvement in her symptoms and decided to call her daughter to bring her to the ED.  In the ED, also noted 2 episodes of blue flashing lights.  Has headache and feels nauseous.  Has felt palpitation.  She also reports increasing tinnitus and a "suction" feeling in her ears that have been occurring more frequently.  Takes plavix but no aspirin daily. She endorsed ongoing tobacco use of about 5 to 6 cigarettes/day. She previously quit but lost her husband back in March and had resumed smoking.  Drinks about 3 mixed drinks over weekend.  Denies any illicit drug use.  ED Course: She was afebrile with BP of 135/102 and stable on room air.  WBC of 3.3.  Hemoglobin of 15.4.  Potassium of 3.3 with no other electrolyte abnormalities.  UDS was negative.  CT head showed only old strokes.  Patient was evaluated by Tele-neuro who recommended further  stroke work-up.  No tPA given as patient was outside of window.   Review of Systems: Constitutional: No Weight Change, No Fever ENT/Mouth: No sore throat, No Rhinorrhea Eyes: No Eye Pain, + Vision Changes Cardiovascular: No Chest Pain, no SOB, , + Palpitations Respiratory: No Cough, No Sputum, No Wheezing, no Dyspnea  Gastrointestinal: + Nausea, No Vomiting, No Diarrhea, No Constipation, No Pain Genitourinary: no Urinary Incontinence, No Urgency, No Flank Pain Musculoskeletal: No Arthralgias, No Myalgias Skin: No Skin Lesions, No Pruritus, Neuro: +Weakness, + Numbness,  No Loss of Consciousness, No Syncope Psych: No Anxiety/Panic, No Depression, no decrease appetite Heme/Lymph: No Bruising, No Bleeding  Past Medical History:  Diagnosis Date   HIV (human immunodeficiency virus infection) (HCC)    Hypertension    Seizures ([place])    Stroke ([place])    Vision abnormalities     Past Surgical History:  Procedure Laterality Date   LOOP RECORDER INSERTION N/A 03/29/2019   Procedure: LOOP RECORDER INSERTION;  Surgeon: Constance Haw, MD;  Location: [facility];  Service: Cardiovascular;  Laterality: N/A;     reports that she has been smoking cigarettes. She started smoking about 5 years ago. She has been smoking an average of .5 packs per day. She has never used smokeless tobacco. She reports current alcohol use of about 60.0 standard drinks per week. She reports that she does not use drugs. Social History  Allergies  Allergen Reactions   Lisinopril Swelling    Family History  Problem Relation Age of  Onset   Hypertension Mother      Prior to Admission medications   Medication Sig Start Date End Date Taking? Authorizing Provider  albuterol (PROVENTIL HFA;VENTOLIN HFA) 108 (90 Base) MCG/ACT inhaler Inhale 2 puffs into the lungs every 4 (four) hours as needed for wheezing or shortness of breath (cough, shortness of breath or wheezing.). 12/15/18  Yes Dixon, Melton Krebs, NP   amLODipine (NORVASC) 10 MG tablet TAKE 1 TABLET BY MOUTH EVERY DAY Patient taking differently: Take 10 mg by mouth daily. 04/05/21  Yes King, Diona Foley, NP  atorvastatin (LIPITOR) 40 MG tablet TAKE 1 TABLET BY MOUTH DAILY AT 6 PM. Patient taking differently: Take 40 mg by mouth daily. 07/20/21  Yes [name], NP  cetirizine (ZYRTEC) 10 MG tablet TAKE 1 TABLET BY MOUTH EVERY DAY Patient taking differently: Take 10 mg by mouth daily as needed for allergies or rhinitis. 01/03/21  Yes Carlyle Basques, MD  cimetidine (TAGAMET) 200 MG tablet Take 1 tablet (200 mg total) by mouth 2 (two) times daily. Patient taking differently: Take 200 mg by mouth 2 (two) times daily as needed (reflux). 01/19/20 09/06/21 Yes King, Diona Foley, NP  [employer] B-12 500 MCG tablet TAKE 1 TABLET BY MOUTH DAILY Patient taking differently: Take 500 mcg by mouth daily. 06/13/21  Yes King, Diona Foley, NP  dolutegravir (TIVICAY) 50 MG tablet TAKE 1 TABLET BY MOUTH EVERY DAY Patient taking differently: Take 50 mg by mouth daily. 08/23/21  Yes Carlyle Basques, MD  emtricitabine-tenofovir AF (DESCOVY) 200-25 MG tablet Take 1 tablet by mouth daily. 08/23/21  Yes Carlyle Basques, MD  fluticasone [facility]) 50 MCG/ACT nasal spray SPRAY 2 SPRAYS INTO EACH NOSTRIL EVERY DAY 06/20/21  Yes Carlyle Basques, MD  hydrochlorothiazide (HYDRODIURIL) 25 MG tablet Take 1 tablet (25 mg total) by mouth daily. 07/23/21  Yes King, Diona Foley, NP  PARoxetine (PAXIL) 30 MG tablet TAKE 1 TABLET BY MOUTH EVERY DAY Patient taking differently: Take 30 mg by mouth daily. 02/15/19  Yes Carlyle Basques, MD  traZODone (DESYREL) 50 MG tablet TAKE 1/2 TABLETS BY MOUTH AT BEDTIME AS NEEDED FOR SLEEP. 06/21/21  Yes [name], NP  Vitamin D, Ergocalciferol, (DRISDOL) 1.25 MG (50000 UNIT) CAPS capsule TAKE 1 CAPSULE BY MOUTH ONE TIME PER WEEK Patient taking differently: Take 50,000 Units by mouth every 7 (seven) days. Mondays 06/19/21  Yes [name], NP    Physical  Exam: Vitals:   09/06/21 2126 09/06/21 2131 09/06/21 2146 09/06/21 2147  BP: (!) 154/117 (!) 135/102  (!) 135/102  Pulse: 64 73 68 68  Resp: 17 (!) 25 20 20   Temp:      TempSrc:      SpO2: 100% 97% 98% 98%    Constitutional: NAD, mildly anxious appearing female sitting upright in bed Vitals:   09/06/21 2126 09/06/21 2131 09/06/21 2146 09/06/21 2147  BP: (!) 154/117 (!) 135/102  (!) 135/102  Pulse: 64 73 68 68  Resp: 17 (!) 25 20 20   Temp:      TempSrc:      SpO2: 100% 97% 98% 98%   Eyes: PERRL, lids and conjunctivae normal ENMT: Mucous membranes are moist. Neck: normal, supple Respiratory: clear to auscultation bilaterally, no wheezing, no crackles. Normal respiratory effort. No accessory muscle use.  Cardiovascular: Regular rate and rhythm, no murmurs / rubs / gallops. No extremity edema. 2+ pedal pulses. No carotid bruits.  Abdomen: no tenderness, no masses palpated.  Bowel sounds positive.  Musculoskeletal: no clubbing /  cyanosis. No joint deformity upper and lower extremities.  Left hand held in extension and unable to put hand into flexion due to previous CVA. Skin: no rashes, lesions, ulcers. No induration Neurologic: Left-sided facial drooping, left-sided weakness with asymmetric shoulder shrug, 4 out of 5 strength of left upper and lower extremity.  CN 2-12 grossly intact.  Decreased sensation of the entire left side including facial nerves. Psychiatric: Normal judgment and insight. Alert and oriented x 3. Normal mood.     Labs on Admission: I have personally reviewed following labs and imaging studies  CBC: Recent Labs  Lab 09/06/21 2042 09/06/21 2052  WBC 3.3*  --   NEUTROABS 1.5*  --   HGB 15.4* 16.3*  HCT 45.4 48.0*  MCV 92.5  --   PLT 173  --    Basic Metabolic Panel: Recent Labs  Lab 09/06/21 2042 09/06/21 2052  NA 145 139  K 3.4* 3.3*  CL 104 102  CO2 28  --   GLUCOSE 98 92  BUN 15 13  CREATININE 0.87 0.80  CALCIUM 10.2  --    GFR: CrCl  cannot be calculated (Unknown ideal weight.). Liver Function Tests: Recent Labs  Lab 09/06/21 2042  AST 42*  ALT 48*  ALKPHOS 84  BILITOT 1.6*  PROT 8.9*  ALBUMIN 4.6   No results for input(s): LIPASE, AMYLASE in the last 168 hours. No results for input(s): AMMONIA in the last 168 hours. Coagulation Profile: Recent Labs  Lab 09/06/21 2042  INR 0.9   Cardiac Enzymes: No results for input(s): CKTOTAL, CKMB, CKMBINDEX, TROPONINI in the last 168 hours. BNP (last 3 results) No results for input(s): PROBNP in the last 8760 hours. HbA1C: No results for input(s): HGBA1C in the last 72 hours. CBG: Recent Labs  Lab 09/06/21 2029  GLUCAP 92   Lipid Profile: No results for input(s): CHOL, HDL, LDLCALC, TRIG, CHOLHDL, LDLDIRECT in the last 72 hours. Thyroid Function Tests: No results for input(s): TSH, T4TOTAL, FREET4, T3FREE, THYROIDAB in the last 72 hours. Anemia Panel: No results for input(s): VITAMINB12, FOLATE, FERRITIN, TIBC, IRON, RETICCTPCT in the last 72 hours. Urine analysis:    Component Value Date/Time   COLORURINE YELLOW 09/06/2021 2100   APPEARANCEUR CLEAR 09/06/2021 2100   LABSPEC 1.015 09/06/2021 2100   PHURINE 7.5 09/06/2021 2100   GLUCOSEU NEGATIVE 09/06/2021 2100   HGBUR NEGATIVE 09/06/2021 2100   [place] NEGATIVE 09/06/2021 2100   BILIRUBINUR neg 08/02/2021 [place] 09/06/2021 2100   PROTEINUR NEGATIVE 09/06/2021 2100   UROBILINOGEN 1.0 08/02/2021 1152   UROBILINOGEN 1.0 04/30/2017 1118   NITRITE NEGATIVE 09/06/2021 2100   LEUKOCYTESUR NEGATIVE 09/06/2021 2100    Radiological Exams on Admission: CT HEAD CODE STROKE WO CONTRAST  Result Date: 09/06/2021 CLINICAL DATA:  Code stroke. Initial evaluation for facial numbness and tingling as well as within the left leg and hand. EXAM: CT HEAD WITHOUT CONTRAST TECHNIQUE: Contiguous axial images were obtained from the base of the skull through the vertex without intravenous contrast.  COMPARISON:  Prior study from 03/28/2019 FINDINGS: Brain: Age-related cerebral atrophy with chronic microvascular ischemic disease. Multiple remote cortical/subcortical infarcts noted involving the right frontal, left frontal, and left parietal lobes. Small remote right cerebellar infarct. No acute intracranial hemorrhage. No acute large vessel territory infarct. No mass lesion, midline shift or mass effect. No hydrocephalus or extra-axial fluid collection. Vascular: No hyperdense vessel. Scattered vascular calcifications noted within the carotid siphons. Skull: Scalp soft tissues faint calvarium within normal limits.  Sinuses/Orbits: Globes and orbital soft tissues demonstrate no acute finding. Mild mucoperiosteal thickening noted within the ethmoidal air cells. Paranasal sinuses are otherwise clear. No mastoid effusion. Other: None. ASPECTS [facility] Stroke Program Early CT Score) - Ganglionic level infarction (caudate, lentiform nuclei, internal capsule, insula, M1-M3 cortex): 7 - Supraganglionic infarction (M4-M6 cortex): 3 Total score (0-10 with 10 being normal): 10 IMPRESSION: 1. No acute intracranial abnormality. 2. ASPECTS is 10. 3. Multiple chronic ischemic infarcts involving the bilateral frontal lobes, left parietal lobe, and right cerebellum. Results were called by telephone at the time of interpretation on 09/06/2021 at 8:44 pm to provider [facility] , who verbally acknowledged these results. Electronically Signed   By: Jeannine Boga M.D.   On: 09/06/2021 20:47      Assessment/Plan  Worsening left-sided paresthesias/weakness -Has history of recurrent stroke in 2012 and 2020.  Has loop recorder but reportedly had no findings -Patient has been evaluated by tele-neuro and recommends further stroke work up -- obtain MRI brain - MRA head and neck - Obtain echocardiogram -previous echo in 2020 without PFO -Obtain A1c and lipids -PT/OT/SLT -Frequent neuro checks and keep on  telemetry -Allow for permissive hypertension with blood pressure treatment as needed only if systolic goes above 336 -pt has only been on Plavix only and aspirin was d/c by neurology in 2020. Will resume DAPT per neurology.   HTN -Hold home antihypertensives and allow for permissive hypertension during stroke work-up  Hypokalemia - K of 3.3.  Replete with oral potassium and check with morning labs  Asymptomatic HIV -Follows with ID Dr. Graylon Good - Continue antiviral therapy  Hx of ascending thoracic aorta aneurysm - Diagnosed in 2020 with 3.8 cm diameter.  Asymptomatic at this time but recommends continual follow-up and monitoring outpatient   DVT prophylaxis:.Lovenox Code Status: Full Family Communication: Plan discussed with patient and daughter at bedside  disposition Plan: Home with observation Consults called: Neurology Admission status: Observation  Level of care: Progressive  Status is: Observation  The patient remains OBS appropriate and will d/c before 2 midnights.  Dispo: The patient is from: Home              Anticipated d/c is to: Home              Patient currently is not medically stable to d/c.   Difficult to place patient No         Orene Desanctis DO [facility]   If 7PM-7AM, please contact night-coverage [email or web address]   09/06/2021, 10:47 PM

## 2021-09-06 NOTE — ED Notes (Signed)
Dr. Joya Gaskins has been notified of pt's I-stat beta and chem-8 results.

## 2021-09-06 NOTE — Consult Note (Signed)
[facility] TeleNeurology Consult Services   Date of Service:   09/06/2021 20:33:05  Diagnosis:       I63.9 - Cerebrovascular accident (CVA), unspecified mechanism ([place])  Impression:      Small vessel ischemic stroke right hemisphere. She is not a candidate for thrombolytic therapy because she is out of the window of opportunity. Nothing to suggest large vessel occlusion to warrant urgent CTA angiography for potential thrombectomy. She needs MRI of the brain, MRA head neck, A1c, fasting lipid profile and statin use or adjustment to target LDL of 70, smoking cessation, echocardiogram, rehab service evaluation.  Metrics: Last Known Well: 09/06/2021 15:00:00 [facility] Notification Time: 09/06/2021 20:33:05 Arrival Time: 09/06/2021 19:42:00 Stamp Time: 09/06/2021 20:33:05 Initial Response Time: 09/06/2021 20:37:11 Symptoms: Left side numbness and weakness. NIHSS Start Assessment Time: 09/06/2021 20:40:05 Patient is not a candidate for Thrombolytic. Thrombolytic Medical Decision: 09/06/2021 20:45:00 Patient was not deemed candidate for Thrombolytic because of following reasons: Last Well Known Above 4.5 Hours.  CT head showed no acute hemorrhage or acute core infarct. Image Unavailable - Discussed with Radiologist: As per Radiologist CT Showed Aspects 68, old small infarcts but no hemorrhage or mass lesion.  Radiologist was not called back for review of advanced imaging. ED Physician notified of diagnostic impression and management plan on 09/06/2021 20:52:00  Advanced Imaging: Advanced Imaging Not Completed because:  Nothing to suggest large vessel occlusion clinically   Our recommendations are outlined below.  Recommendations:        Stroke/Telemetry Floor       Neuro Checks       Bedside Swallow Eval       DVT Prophylaxis       IV Fluids, Normal Saline       Head of Bed 30 Degrees       Euglycemia and Avoid Hyperthermia (PRN Acetaminophen)        Initiate dual antiplatelet therapy with Aspirin 81 mg daily and Clopidogrel 75 mg daily.       Antihypertensives PRN if Blood pressure is greater than 220/120 or there is a concern for End organ damage/contraindications for permissive HTN. If blood pressure is greater than 220/120 give labetalol PO or IV or Vasotec IV with a goal of 15% reduction in BP during the first 24 hours.  Routine Consultation with [facility] for Follow up Care  Sign Out:       Discussed with Emergency Department Provider    ------------------------------------------------------------------------------  History of Present Illness: Patient is a 55 year old Female.  Patient was brought by private transportation with symptoms of Left side numbness and weakness.  This 55 year old woman was had to prior strokes began to have numbness on her left side at approximately 1500 which would be the time that her granddaughter was leaving her house. It worsened at approximate 1700. Her lips face arm and leg are all tingling and she has some weakness of the left leg with the droop of the face.    Past Medical History:      Hypertension      Hyperlipidemia      Stroke      There is NO history of Diabetes Mellitus      There is NO history of Atrial Fibrillation      There is NO history of Coronary Artery Disease      There is NO history of Covid-19      She has a loop monitor. She stopped smoking only three days ago.  Social  History: Smoking: Yes Alcohol Use: No Drug Use: No  Family History:      Non-contributory  Anticoagulant use:  No  Antiplatelet use: Yes Low petechial  Allergies:  NKDA     Examination: BP(140/95), Pulse(64), Blood Glucose(92) 1A: Level of Consciousness - Alert; keenly responsive + 0 1B: Ask Month and Age - Both Questions Right + 0 1C: Blink Eyes & Squeeze Hands - Performs Both Tasks + 0 2: Test Horizontal Extraocular Movements - Normal + 0 3: Test Visual Fields - No Visual Loss +  0 4: Test Facial Palsy (Use Grimace if Obtunded) - Minor paralysis (flat nasolabial fold, smile asymmetry) + 1 5A: Test Left Arm Motor Drift - No Drift for 10 Seconds + 0 5B: Test Right Arm Motor Drift - No Drift for 10 Seconds + 0 6A: Test Left Leg Motor Drift - Drift, but doesn't hit bed + 1 6B: Test Right Leg Motor Drift - No Drift for 5 Seconds + 0 7: Test Limb Ataxia (FNF/Heel-Shin) - No Ataxia + 0 8: Test Sensation - Mild-Moderate Loss: Less Sharp/More Dull + 1 9: Test Language/Aphasia - Normal; No aphasia + 0 10: Test Dysarthria - Normal + 0 11: Test Extinction/Inattention - No abnormality + 0  NIHSS Score: 3   Pre-Morbid Modified Rankin Scale: 1 Points = No significant disability despite symptoms; able to carry out all usual duties and activities   Patient/Family was informed the Neurology Consult would occur via TeleHealth consult by way of interactive audio and video telecommunications and consented to receiving care in this manner.   Patient is being evaluated for possible acute neurologic impairment and high probability of imminent or life-threatening deterioration. I spent total of 25 minutes providing care to this patient, including time for face to face visit via telemedicine, review of medical records, imaging studies and discussion of findings with providers, the patient and/or family.   Dr Cindie Laroche   [facility] [phone]  Case [id number]

## 2021-09-06 NOTE — ED Notes (Signed)
Patient transported to MRI 

## 2021-09-06 NOTE — ED Notes (Signed)
Admitting provider Tu at bedside

## 2021-09-06 NOTE — ED Notes (Signed)
Teleneuro MD present at this time.

## 2021-09-07 ENCOUNTER — Observation Stay (HOSPITAL_BASED_OUTPATIENT_CLINIC_OR_DEPARTMENT_OTHER): Payer: Medicare Other

## 2021-09-07 DIAGNOSIS — I639 Cerebral infarction, unspecified: Secondary | ICD-10-CM | POA: Diagnosis not present

## 2021-09-07 DIAGNOSIS — R29818 Other symptoms and signs involving the nervous system: Secondary | ICD-10-CM | POA: Diagnosis not present

## 2021-09-07 DIAGNOSIS — F17210 Nicotine dependence, cigarettes, uncomplicated: Secondary | ICD-10-CM | POA: Diagnosis not present

## 2021-09-07 DIAGNOSIS — Z79899 Other long term (current) drug therapy: Secondary | ICD-10-CM | POA: Diagnosis not present

## 2021-09-07 DIAGNOSIS — R200 Anesthesia of skin: Secondary | ICD-10-CM | POA: Diagnosis present

## 2021-09-07 DIAGNOSIS — G459 Transient cerebral ischemic attack, unspecified: Secondary | ICD-10-CM

## 2021-09-07 DIAGNOSIS — B20 Human immunodeficiency virus [HIV] disease: Secondary | ICD-10-CM | POA: Diagnosis not present

## 2021-09-07 DIAGNOSIS — Y900 Blood alcohol level of less than 20 mg/100 ml: Secondary | ICD-10-CM | POA: Diagnosis not present

## 2021-09-07 DIAGNOSIS — I10 Essential (primary) hypertension: Secondary | ICD-10-CM | POA: Diagnosis not present

## 2021-09-07 DIAGNOSIS — Z20822 Contact with and (suspected) exposure to covid-19: Secondary | ICD-10-CM | POA: Diagnosis not present

## 2021-09-07 DIAGNOSIS — R531 Weakness: Secondary | ICD-10-CM

## 2021-09-07 LAB — ECHOCARDIOGRAM COMPLETE
Area-P 1/2: 2.11 cm2
Calc EF: 57.7 %
S' Lateral: 2.9 cm
Single Plane A2C EF: 61.1 %
Single Plane A4C EF: 54.7 %

## 2021-09-07 LAB — HEMOGLOBIN A1C
Hgb A1c MFr Bld: 5.4 % (ref 4.8–5.6)
Mean Plasma Glucose: 108.28 mg/dL

## 2021-09-07 MED ORDER — EMTRICITABINE-TENOFOVIR AF 200-25 MG PO TABS
1.0000 | ORAL_TABLET | Freq: Every day | ORAL | Status: DC
  Administered 2021-09-07 – 2021-09-08 (×2): 1 via ORAL
  Filled 2021-09-07 (×2): qty 1

## 2021-09-07 MED ORDER — PAROXETINE HCL 30 MG PO TABS
30.0000 mg | ORAL_TABLET | Freq: Every day | ORAL | Status: DC
  Administered 2021-09-07 – 2021-09-08 (×2): 30 mg via ORAL
  Filled 2021-09-07 (×2): qty 1

## 2021-09-07 MED ORDER — TRAZODONE HCL 50 MG PO TABS: 50.0000 mg | ORAL_TABLET | Freq: Every evening | ORAL | Status: DC | PRN

## 2021-09-07 MED ORDER — ATORVASTATIN CALCIUM 40 MG PO TABS
40.0000 mg | ORAL_TABLET | Freq: Every day | ORAL | Status: DC
  Administered 2021-09-07 – 2021-09-08 (×2): 40 mg via ORAL
  Filled 2021-09-07 (×2): qty 1

## 2021-09-07 MED ORDER — VITAMIN D (ERGOCALCIFEROL) 1.25 MG (50000 UNIT) PO CAPS: 50000.0000 [IU] | ORAL_CAPSULE | ORAL | Status: DC

## 2021-09-07 MED ORDER — CLOPIDOGREL BISULFATE 75 MG PO TABS
75.0000 mg | ORAL_TABLET | Freq: Every day | ORAL | Status: DC
  Administered 2021-09-07 – 2021-09-08 (×2): 75 mg via ORAL
  Filled 2021-09-07 (×2): qty 1

## 2021-09-07 MED ORDER — POTASSIUM CHLORIDE CRYS ER 20 MEQ PO TBCR
40.0000 meq | EXTENDED_RELEASE_TABLET | Freq: Once | ORAL | Status: AC
  Administered 2021-09-07: 40 meq via ORAL
  Filled 2021-09-07: qty 2

## 2021-09-07 MED ORDER — ASPIRIN 81 MG PO CHEW
81.0000 mg | CHEWABLE_TABLET | Freq: Once | ORAL | Status: AC
  Administered 2021-09-07: 81 mg via ORAL
  Filled 2021-09-07: qty 1

## 2021-09-07 MED ORDER — ALBUTEROL SULFATE (2.5 MG/3ML) 0.083% IN NEBU: 3.0000 mL | INHALATION_SOLUTION | RESPIRATORY_TRACT | Status: DC | PRN

## 2021-09-07 MED ORDER — FAMOTIDINE 10 MG PO TABS: 10.0000 mg | ORAL_TABLET | Freq: Two times a day (BID) | ORAL | Status: DC | PRN

## 2021-09-07 MED ORDER — DOLUTEGRAVIR SODIUM 50 MG PO TABS
50.0000 mg | ORAL_TABLET | Freq: Every day | ORAL | Status: DC
  Administered 2021-09-07 – 2021-09-08 (×2): 50 mg via ORAL
  Filled 2021-09-07 (×2): qty 1

## 2021-09-07 NOTE — ED Notes (Signed)
Unable to collect the lipid panel because pt had recently ate a meal

## 2021-09-07 NOTE — Progress Notes (Signed)
PROGRESS NOTE    Abigail Medina  PPI:[id number] DOB: 07/02/1966 DOA: 09/06/2021 PCP: [name], NP    Chief Complaint  Patient presents with   Numbness    Brief Narrative:  Abigail Medina is a 55 y.o. female with medical history significant for HIV, recurrent CVA, thoracic aortic aneurysm, HTN, and hyperlipidemia who presents with concerns of left-sided numbness and weakness. She had history of CVA with residual left-sided hemiparesis in 2012 recurrent stroke back in 2020 and had a loop recorder placement by cardiology but believes no arrhythmia was ever found. Tele neuro consulted and recommendations given.  MRI brain and MRA head and neck are unremarkable.   Assessment & Plan:   Principal Problem:   Paresthesia Active Problems:   Human immunodeficiency virus (HIV) disease (HCC)   Essential hypertension   Aneurysm of thoracic aorta (HCC)   Left-sided weakness   Worsening of the left sided weakness, with paraesthesias of the upper extremity on the left Improved.  Echocardiogram pending.  MRI of the head is negative for acute stroke. and MRA of the head and neck are unremarkable.  Pt on plavix at home,, aspirin was added today.  Neurology consulted, recommended to transfer the patient to [facility] for further evaluation.  Therapy eval are pending.  UA IS neg UDS is negative.    Elevated liver enzymes:  Mild  Repeat in am.     Hypertension;  Permissive hypertension   Hypokalemia  Replaced.    HIV.  Resume home meds.   Hx of ascending thoracic aorta aneurysm - Diagnosed in 2020 with 3.8 cm diameter.  - outpatient follow up/monitor.    DVT prophylaxis: (Lovenox) Code Status: (Full code) Family Communication: daughter at bedside.  Disposition:   Status is: Observation  The patient remains OBS appropriate and will d/c before 2 midnights.  Dispo: The patient is from: Home              Anticipated d/c is to: Home              Patient currently  is not medically stable to d/c.   Difficult to place patient No       Consultants:  Neurology.   Procedures: MRI brain  MRA of the head and neck.  Echocardiogram.   Antimicrobials: none   Subjective: Pt reports left sided weakness, tingling and numbness has improved.   Objective: Vitals:   09/07/21 0500 09/07/21 0600 09/07/21 0700 09/07/21 0800  BP: 116/90 107/89 110/83 102/87  Pulse: 70 61 67 70  Resp: 19 18 13 18   Temp:      TempSrc:      SpO2: 97% 96% 96% 94%   No intake or output data in the 24 hours ending 09/07/21 1313 There were no vitals filed for this visit.  Examination:  General exam: Appears calm and comfortable  Respiratory system: Clear to auscultation. Respiratory effort normal. Cardiovascular system: S1 & S2 heard, RRR. No JVD, No pedal edema. Gastrointestinal system: Abdomen is nondistended, soft and nontender. Normal bowel sounds heard. Central nervous system: Alert and oriented. LEFT SIDED RESIDUAL hemiparesis. Tingling and numbness has improved.  Extremities: no pedal edema.  Skin: No rashes, lesions or ulcers Psychiatry:  Mood & affect appropriate.     Data Reviewed: I have personally reviewed following labs and imaging studies  CBC: Recent Labs  Lab 09/06/21 2042 09/06/21 2052  WBC 3.3*  --   NEUTROABS 1.5*  --   HGB 15.4* 16.3*  HCT 45.4  48.0*  MCV 92.5  --   PLT 173  --     Basic Metabolic Panel: Recent Labs  Lab 09/06/21 2042 09/06/21 2052  NA 145 139  K 3.4* 3.3*  CL 104 102  CO2 28  --   GLUCOSE 98 92  BUN 15 13  CREATININE 0.87 0.80  CALCIUM 10.2  --     GFR: CrCl cannot be calculated (Unknown ideal weight.).  Liver Function Tests: Recent Labs  Lab 09/06/21 2042  AST 42*  ALT 48*  ALKPHOS 84  BILITOT 1.6*  PROT 8.9*  ALBUMIN 4.6    CBG: Recent Labs  Lab 09/06/21 2029  GLUCAP 92     Recent Results (from the past 240 hour(s))  Resp Panel by RT-PCR (Flu A&B, Covid) Nasopharyngeal Swab      Status: None   Collection Time: 09/06/21  8:17 PM   Specimen: Nasopharyngeal Swab; Nasopharyngeal(NP) swabs in vial transport medium  Result Value Ref Range Status   SARS Coronavirus 2 by RT PCR NEGATIVE NEGATIVE Final    Comment: (NOTE) SARS-CoV-2 target nucleic acids are NOT DETECTED.  The SARS-CoV-2 RNA is generally detectable in upper respiratory specimens during the acute phase of infection. The lowest concentration of SARS-CoV-2 viral copies this assay can detect is 138 copies/mL. A negative result does not preclude SARS-Cov-2 infection and should not be used as the sole basis for treatment or other patient management decisions. A negative result may occur with  improper specimen collection/handling, submission of specimen other than nasopharyngeal swab, presence of viral mutation(s) within the areas targeted by this assay, and inadequate number of viral copies(<138 copies/mL). A negative result must be combined with clinical observations, patient history, and epidemiological information. The expected result is Negative.  Fact Sheet for Patients:  EntrepreneurPulse.com.au  Fact Sheet for Healthcare Providers:  IncredibleEmployment.be  This test is no t yet approved or cleared by the [place] FDA and  has been authorized for detection and/or diagnosis of SARS-CoV-2 by FDA under an Emergency Use Authorization (EUA). This EUA will remain  in effect (meaning this test can be used) for the duration of the COVID-19 declaration under Section 564(b)(1) of the Act, 21 U.S.C.section 360bbb-3(b)(1), unless the authorization is terminated  or revoked sooner.       Influenza A by PCR NEGATIVE NEGATIVE Final   Influenza B by PCR NEGATIVE NEGATIVE Final    Comment: (NOTE) The Xpert Xpress SARS-CoV-2/FLU/RSV plus assay is intended as an aid in the diagnosis of influenza from Nasopharyngeal swab specimens and should not be used as a sole basis  for treatment. Nasal washings and aspirates are unacceptable for Xpert Xpress SARS-CoV-2/FLU/RSV testing.  Fact Sheet for Patients: EntrepreneurPulse.com.au  Fact Sheet for Healthcare Providers: IncredibleEmployment.be  This test is not yet approved or cleared by the [place] FDA and has been authorized for detection and/or diagnosis of SARS-CoV-2 by FDA under an Emergency Use Authorization (EUA). This EUA will remain in effect (meaning this test can be used) for the duration of the COVID-19 declaration under Section 564(b)(1) of the Act, 21 U.S.C. section 360bbb-3(b)(1), unless the authorization is terminated or revoked.  Performed at [facility], [place] [address], [place], [place] [address]          Radiology Studies: MR ANGIO HEAD WO CONTRAST  Result Date: 09/07/2021 CLINICAL DATA:  55 year old female with code stroke presentation. Neurologic deficit. Left extremity symptoms. EXAM: MRA HEAD WITHOUT CONTRAST TECHNIQUE: Angiographic images of the Circle of Willis were  acquired using MRA technique without intravenous contrast. COMPARISON:  Brain MRI today reported separately. CTA head and neck 03/27/2019. neck MRA today. FINDINGS: Antegrade flow in the posterior circulation with slightly dominant left vertebral artery. Patent PICA origins and V4 segments to the basilar without stenosis. Patent basilar artery without stenosis. Patent SCA and PCA origins. Posterior communicating arteries are diminutive or absent. Bilateral PCA branches are within normal limits. Antegrade flow in both ICA siphons. Tortuous ICAs just below the skull base. No siphon stenosis. Normal ophthalmic artery origins. Patent carotid termini, MCA and ACA origins. Diminutive or absent anterior communicating artery. Visible ACA branches are within normal limits, mildly motion degraded. Left MCA M1 segment and bifurcation are patent without stenosis. Left MCA  branches appear stable and within normal limits. Right MCA M1 segment, bifurcation, and visible right MCA branches appear stable and within normal limits. IMPRESSION: Negative intracranial MRA. Electronically Signed   By: Genevie Ann M.D.   On: 09/07/2021 06:02   MR ANGIO NECK W WO CONTRAST  Result Date: 09/07/2021 CLINICAL DATA:  55 year old female with code stroke presentation. Neurologic deficit. Left extremity symptoms. EXAM: MRA NECK WITHOUT AND WITH CONTRAST TECHNIQUE: Multiplanar and multiecho pulse sequences of the neck were obtained without and with intravenous contrast. Angiographic images of the neck were obtained using MRA technique without and with intravenous contrast. CONTRAST:  6.47mL GADAVIST GADOBUTROL 1 MMOL/ML IV SOLN COMPARISON:  CTA head and neck 03/27/2019. FINDINGS: Precontrast time-of-flight imaging demonstrates antegrade flow in the bilateral cervical carotid and vertebral arteries. Both vertebral arteries are tortuous, but appear codominant and patent to the skull base. Patent carotid bifurcations and tortuous ICAs distal to the bulbs without evidence of hemodynamically significant stenosis. Postcontrast images demonstrate a 3 vessel arch configuration. Normal proximal great vessels. Mildly tortuous proximal right CCA. Negative right carotid bifurcation. Tortuous right ICA distal to the bulb without stenosis. Mildly tortuous left CCA without stenosis. Normal left carotid bifurcation. Tortuous cervical left ICA distal to the bulb without stenosis. Proximal subclavian arteries and vertebral artery origins appear normal. The left vertebral appears mildly dominant. Tortuous bilateral V1 and V2 segments. Both vertebral arteries appear patent without stenosis. Grossly negative visible intracranial circulation. See Head MRA today reported separately. IMPRESSION: Negative Neck MRA aside from generalized vessel tortuosity. Mildly dominant left vertebral artery. Electronically Signed   By: Genevie Ann  M.D.   On: 09/07/2021 05:59   MR BRAIN WO CONTRAST  Result Date: 09/07/2021 CLINICAL DATA:  55 year old female with code stroke presentation. Neurologic deficit. Left extremity symptoms. EXAM: MRI HEAD WITHOUT CONTRAST TECHNIQUE: Multiplanar, multiecho pulse sequences of the brain and surrounding structures were obtained without intravenous contrast. COMPARISON:  Head CT 09/06/2021.  Brain MRI 03/28/2019. FINDINGS: Brain: No restricted diffusion or evidence of acute infarction. Chronic infarcts and encephalomalacia in the bilateral MCA territories, including perirolandic cortex on the right, parietal lobe and anterior frontal lobe on the left. Chronic infarct in the right cerebellum. Patchy and scattered bilateral cerebral white matter T2 and FLAIR hyperintensity has not significantly changed since 2020. No chronic cerebral blood products. And the deep gray nuclei, brainstem remain spared. No midline shift, mass effect, evidence of mass lesion, ventriculomegaly, extra-axial collection or acute intracranial hemorrhage. Cervicomedullary junction and pituitary are within normal limits. Vascular: Major intracranial vascular flow voids are stable since 2020. Skull and upper cervical spine: Negative visible cervical spine. Normal bone marrow signal. Sinuses/Orbits: Negative orbits. Stable mild paranasal sinus mucosal thickening. Other: Mastoids remain clear. Grossly normal visible internal auditory structures.  Negative visible scalp and face. IMPRESSION: 1. No acute intracranial abnormality. 2. 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 Electronically Signed   By: Genevie Ann M.D.   On: 09/07/2021 05:55   CT HEAD CODE STROKE WO CONTRAST  Result Date: 09/06/2021 CLINICAL DATA:  Code stroke. Initial evaluation for facial numbness and tingling as well as within the left leg and hand. EXAM: CT HEAD WITHOUT CONTRAST TECHNIQUE: Contiguous axial images  were obtained from the base of the skull through the vertex without intravenous contrast. COMPARISON:  Prior study from 03/28/2019 FINDINGS: Brain: Age-related cerebral atrophy with chronic microvascular ischemic disease. Multiple remote cortical/subcortical infarcts noted involving the right frontal, left frontal, and left parietal lobes. Small remote right cerebellar infarct. No acute intracranial hemorrhage. No acute large vessel territory infarct. No mass lesion, midline shift or mass effect. No hydrocephalus or extra-axial fluid collection. Vascular: No hyperdense vessel. Scattered vascular calcifications noted within the carotid siphons. Skull: Scalp soft tissues faint calvarium within normal limits. Sinuses/Orbits: Globes and orbital soft tissues demonstrate no acute finding. Mild mucoperiosteal thickening noted within the ethmoidal air cells. Paranasal sinuses are otherwise clear. No mastoid effusion. Other: None. ASPECTS [facility] Stroke Program Early CT Score) - Ganglionic level infarction (caudate, lentiform nuclei, internal capsule, insula, M1-M3 cortex): 7 - Supraganglionic infarction (M4-M6 cortex): 3 Total score (0-10 with 10 being normal): 10 IMPRESSION: 1. No acute intracranial abnormality. 2. ASPECTS is 10. 3. Multiple chronic ischemic infarcts involving the bilateral frontal lobes, left parietal lobe, and right cerebellum. Results were called by telephone at the time of interpretation on 09/06/2021 at 8:44 pm to provider [facility] , who verbally acknowledged these results. Electronically Signed   By: Jeannine Boga M.D.   On: 09/06/2021 20:47        Scheduled Meds:   stroke: mapping our early stages of recovery book   Does not apply Once   atorvastatin  40 mg Oral Daily   clopidogrel  75 mg Oral Daily   dolutegravir  50 mg Oral Daily   emtricitabine-tenofovir AF  1 tablet Oral Daily   enoxaparin (LOVENOX) injection  40 mg Subcutaneous Q24H   PARoxetine  30 mg Oral Daily    [START ON 09/10/2021] Vitamin D (Ergocalciferol)  50,000 Units Oral Q7 days   Continuous Infusions:   LOS: 0 days        Hosie Poisson, MD [facility]   To contact the attending provider between 7A-7P or the covering provider during after hours 7P-7A, please log into the web site [email or web address] and access using universal [place] password for that web site. If you do not have the password, please call the hospital operator.  09/07/2021, 1:13 PM

## 2021-09-07 NOTE — Evaluation (Signed)
Physical Therapy Evaluation Patient Details Name: Abigail Medina MRN: [id number] DOB: [birth year] Today's Date: 09/07/2021  History of Present Illness  Abigail Medina is a 55 y.o. female with medical history significant for HIV, recurrent CVA, thoracic aortic aneurysm, HTN, and hyperlipidemia who presents with concerns of left-sided numbness and weakness. Imaging negative  Clinical Impression  Patient presents with decreased strength and sensation L side, but feels resolving.  Able to walk in room with supervision assist.  Feel she will return to baseline, but PT to follow acutely to ensure safety on stairs as recent fall at home.  No follow up PT recommended at d/c.        Recommendations for follow up therapy are one component of a multi-disciplinary discharge planning process, led by the attending physician.  Recommendations may be updated based on patient status, additional functional criteria and insurance authorization.  Follow Up Recommendations No PT follow up    Equipment Recommendations  None recommended by PT    Recommendations for Other Services       Precautions / Restrictions Precautions Precautions: Fall      Mobility  Bed Mobility Overal bed mobility: Independent                  Transfers   Equipment used: None Transfers: Sit to/from Stand Sit to Stand: Supervision         General transfer comment: S for safety  Ambulation/Gait Ambulation/Gait assistance: Min guard Gait Distance (Feet): 20 Feet (x2) Assistive device: None Gait Pattern/deviations: Step-through pattern;Decreased stride length     General Gait Details: mild instability with walking to bathroom, but main assist to guide and keep gown closed as son in the room, declined ambulation out of the room, reports too fatigued  Stairs            Wheelchair Mobility    Modified Rankin (Stroke Patients Only) Modified Rankin (Stroke Patients Only) Pre-Morbid Rankin Score:  No significant disability Modified Rankin: Moderate disability     Balance Overall balance assessment: Needs assistance;History of Falls   Sitting balance-Leahy Scale: Good       Standing balance-Leahy Scale: Good Standing balance comment: stands to wash hands no LOB               High Level Balance Comments: feet together 20 sec with S, eyes closed 10 sec with S no LOB             Pertinent Vitals/Pain Pain Assessment: No/denies pain    Home Living Family/patient expects to be discharged to:: Private residence Living Arrangements: Children Available Help at Discharge: Family Type of Home: House Home Access: Stairs to enter Entrance Stairs-Rails: None Technical brewer of Steps: 4 Home Layout: One level Home Equipment: None      Prior Function Level of Independence: Independent         Comments: reports one recent fall down the steps and skinned knee broke toe     Hand Dominance   Dominant Hand: Right    Extremity/Trunk Assessment   Upper Extremity Assessment Upper Extremity Assessment: Defer to OT evaluation    Lower Extremity Assessment Lower Extremity Assessment: RLE deficits/detail;LLE deficits/detail RLE Deficits / Details: WFL strength and ROM LLE Deficits / Details: AROM WFL, strength hip flexion 3-/5, knee extension 4-/5, ankle DF 3+/5 LLE Sensation: decreased light touch LLE Coordination: decreased gross motor       Communication   Communication: No difficulties  Cognition Arousal/Alertness: Awake/alert Behavior During Therapy: [facility]  for tasks assessed/performed Overall Cognitive Status: Within Functional Limits for tasks assessed                                        General Comments General comments (skin integrity, edema, etc.): son and daughter in law in the room and supportive    Exercises     Assessment/Plan    PT Assessment Patient needs continued PT services  PT Problem List Decreased  strength;Impaired sensation;Decreased activity tolerance;Decreased mobility       PT Treatment Interventions Stair training;Balance training;Functional mobility training;Therapeutic exercise;Gait training;Patient/family education;Therapeutic activities    PT Goals (Current goals can be found in the Care Plan section)  Acute Rehab PT Goals Patient Stated Goal: to return to independent PT Goal Formulation: With patient Time For Goal Achievement: 09/14/21 Potential to Achieve Goals: Good    Frequency Min 4X/week   Barriers to discharge        Co-evaluation               AM-PAC PT "6 Clicks" Mobility  Outcome Measure Help needed turning from your back to your side while in a flat bed without using bedrails?: None Help needed moving from lying on your back to sitting on the side of a flat bed without using bedrails?: None Help needed moving to and from a bed to a chair (including a wheelchair)?: None Help needed standing up from a chair using your arms (e.g., wheelchair or bedside chair)?: None Help needed to walk in hospital room?: A Little Help needed climbing 3-5 steps with a railing? : A Little 6 Click Score: 22    End of Session   Activity Tolerance: Patient limited by fatigue Patient left: in bed;with call bell/phone within reach;with family/visitor present   PT Visit Diagnosis: Other abnormalities of gait and mobility (R26.89)    Time: 1600-1620 PT Time Calculation (min) (ACUTE ONLY): 20 min   Charges:   PT Evaluation $PT Eval Low Complexity: 1 Low          Magda Kiel, PT Acute Rehabilitation Services Pager:[phone] Office:[phone] 09/07/2021   Reginia Naas 09/07/2021, 4:47 PM

## 2021-09-07 NOTE — Progress Notes (Signed)
SLP Cancellation Note  Patient Details Name: Ivon Roedel MRN: [id number] DOB: 09-Oct-1966   Cancelled treatment:       Reason Eval/Treat Not Completed: SLP screened, no needs identified, will sign off. MRI negative, no report of acute SLP needs will sign off   Naleyah Ohlinger, Katherene Ponto 09/07/2021, 8:26 AM

## 2021-09-07 NOTE — Progress Notes (Signed)
  Echocardiogram 2D Echocardiogram has been performed.  Fidel Levy 09/07/2021, 11:37 AM

## 2021-09-07 NOTE — ED Notes (Signed)
Called [facility] for transport

## 2021-09-07 NOTE — ED Notes (Signed)
IP handoff done with Ave Filter, RN at [facility]

## 2021-09-07 NOTE — ED Notes (Signed)
Patient returned from MRI.

## 2021-09-07 NOTE — Evaluation (Signed)
Occupational Therapy Evaluation Patient Details Name: Abigail Medina MRN: [id number] DOB: January 15, 1966 Today's Date: 09/07/2021   History of Present Illness Abigail Medina is a 56 y.o. female with medical history significant for HIV, recurrent CVA, thoracic aortic aneurysm, HTN, and hyperlipidemia who presents with concerns of left-sided numbness and weakness. Imaging negative   Clinical Impression   Abigail Medina is a 55 year old woman who has a history of left sided hemiparesis from prior strokes. On evaluation she presents with increased weakness of left wrist and hand compared to her baseline as well as reports of increased weakness in left lower extremity and impaired balance. Overall patient has increased in deficits but demonstrates near baseline functional abilities. Recommend OP OT for LUE. No further OT acute care needs.       Recommendations for follow up therapy are one component of a multi-disciplinary discharge planning process, led by the attending physician.  Recommendations may be updated based on patient status, additional functional criteria and insurance authorization.   Follow Up Recommendations  Outpatient OT    Equipment Recommendations  None recommended by OT    Recommendations for Other Services       Precautions / Restrictions Precautions Precautions: Fall Restrictions Weight Bearing Restrictions: No      Mobility Bed Mobility Overal bed mobility: Independent                  Transfers Overall transfer level: Needs assistance Equipment used: None Transfers: Sit to/from Stand           General transfer comment: min guard to stand and take steps. Reports she is more unsteady and LE feels weaker than normal.    Balance Overall balance assessment: History of Falls (reports falling downstairs recently.)                                         ADL either performed or assessed with clinical judgement   ADL  Overall ADL's : At baseline                                       General ADL Comments: Compensates with right hand and limits use of left but at baseline.     Vision Patient Visual Report: No change from baseline       Perception     Praxis      Pertinent Vitals/Pain Pain Assessment: No/denies pain     Hand Dominance Right   Extremity/Trunk Assessment Upper Extremity Assessment Upper Extremity Assessment: RUE deficits/detail;LUE deficits/detail RUE Deficits / Details: WFL ROM, 5/5 shoulder strength, 5/5 elbow strength, 5/5 wrist, 4/5 grip RUE Sensation: WNL RUE Coordination: WNL LUE Deficits / Details: grossly functional ROM at shoulder, elbow, wrist, fingers tend to stay in extension but can be closed. 4-/5 shoulder strength, 4-/5 elbow, 3/5 wrist, unable to make composite fist LUE Sensation: decreased light touch (hx of impaired sensation on left.) LUE Coordination: decreased gross motor;decreased fine motor (hx of decreased coordination - reports hand is weaker compared to normal.)   Lower Extremity Assessment Lower Extremity Assessment: Defer to PT evaluation   Cervical / Trunk Assessment Cervical / Trunk Assessment: Normal   Communication Communication Communication: No difficulties   Cognition Arousal/Alertness: Awake/alert Behavior During Therapy: WFL for tasks assessed/performed Overall Cognitive Status: Within Functional  Limits for tasks assessed                                     General Comments       Exercises     Shoulder Instructions      Home Living Family/patient expects to be discharged to:: Private residence Living Arrangements: Children Available Help at Discharge: Family Type of Home: House Home Access: Stairs to enter Technical brewer of Steps: 4 Entrance Stairs-Rails: None Home Layout: One level     Bathroom Shower/Tub: Teacher, early years/pre: Standard     Home Equipment:  None          Prior Functioning/Environment Level of Independence: Independent                 OT Problem List: Decreased strength;Decreased range of motion;Decreased coordination;Impaired UE functional use      OT Treatment/Interventions:      OT Goals(Current goals can be found in the care plan section) Acute Rehab OT Goals OT Goal Formulation: All assessment and education complete, DC therapy  OT Frequency:     Barriers to D/C:            Co-evaluation              AM-PAC OT "6 Clicks" Daily Activity     Outcome Measure Help from another person eating meals?: A Little Help from another person taking care of personal grooming?: None Help from another person toileting, which includes using toliet, bedpan, or urinal?: None Help from another person bathing (including washing, rinsing, drying)?: None Help from another person to put on and taking off regular upper body clothing?: None Help from another person to put on and taking off regular lower body clothing?: None 6 Click Score: 23   End of Session Nurse Communication:  (Recommend OP therapy)  Activity Tolerance: Patient tolerated treatment well Patient left: in bed;with call bell/phone within reach;with family/visitor present  OT Visit Diagnosis: Hemiplegia and hemiparesis Hemiplegia - Right/Left: Left Hemiplegia - dominant/non-dominant: Non-Dominant Hemiplegia - caused by: Cerebral infarction                Time: 1761-6073 OT Time Calculation (min): 15 min Charges:  OT General Charges $OT Visit: 1 Visit OT Evaluation $OT Eval Moderate Complexity: 1 Mod  Shenique Childers, OTR/L [place]  Office [phone] Pager: [phone]   Lenward Chancellor 09/07/2021, 9:34 AM

## 2021-09-08 ENCOUNTER — Observation Stay (HOSPITAL_COMMUNITY): Payer: Medicare Other

## 2021-09-08 DIAGNOSIS — I10 Essential (primary) hypertension: Secondary | ICD-10-CM | POA: Diagnosis not present

## 2021-09-08 DIAGNOSIS — R202 Paresthesia of skin: Secondary | ICD-10-CM

## 2021-09-08 DIAGNOSIS — R200 Anesthesia of skin: Secondary | ICD-10-CM | POA: Diagnosis not present

## 2021-09-08 DIAGNOSIS — I639 Cerebral infarction, unspecified: Secondary | ICD-10-CM | POA: Diagnosis not present

## 2021-09-08 LAB — CBC WITH DIFFERENTIAL/PLATELET
Abs Immature Granulocytes: 0 10*3/uL (ref 0.00–0.07)
Basophils Absolute: 0 10*3/uL (ref 0.0–0.1)
Basophils Relative: 0 %
Eosinophils Absolute: 0.1 10*3/uL (ref 0.0–0.5)
Eosinophils Relative: 3 %
HCT: 41.7 % (ref 36.0–46.0)
Hemoglobin: 14.5 g/dL (ref 12.0–15.0)
Immature Granulocytes: 0 %
Lymphocytes Relative: 54 %
Lymphs Abs: 1.7 10*3/uL (ref 0.7–4.0)
MCH: 31.9 pg (ref 26.0–34.0)
MCHC: 34.8 g/dL (ref 30.0–36.0)
MCV: 91.9 fL (ref 80.0–100.0)
Monocytes Absolute: 0.4 10*3/uL (ref 0.1–1.0)
Monocytes Relative: 12 %
Neutro Abs: 1 10*3/uL — ABNORMAL LOW (ref 1.7–7.7)
Neutrophils Relative %: 31 %
Platelets: 154 10*3/uL (ref 150–400)
RBC: 4.54 MIL/uL (ref 3.87–5.11)
RDW: 13.1 % (ref 11.5–15.5)
WBC: 3.2 10*3/uL — ABNORMAL LOW (ref 4.0–10.5)
nRBC: 0 % (ref 0.0–0.2)

## 2021-09-08 LAB — COMPREHENSIVE METABOLIC PANEL
ALT: 37 U/L (ref 0–44)
AST: 27 U/L (ref 15–41)
Albumin: 3.3 g/dL — ABNORMAL LOW (ref 3.5–5.0)
Alkaline Phosphatase: 62 U/L (ref 38–126)
Anion gap: 7 (ref 5–15)
BUN: 10 mg/dL (ref 6–20)
CO2: 26 mmol/L (ref 22–32)
Calcium: 8.9 mg/dL (ref 8.9–10.3)
Chloride: 104 mmol/L (ref 98–111)
Creatinine, Ser: 0.86 mg/dL (ref 0.44–1.00)
GFR, Estimated: >60 mL/min (ref >60)
Glucose, Bld: 96 mg/dL (ref 70–99)
Potassium: 3.5 mmol/L (ref 3.5–5.1)
Sodium: 137 mmol/L (ref 135–145)
Total Bilirubin: 1.5 mg/dL — ABNORMAL HIGH (ref 0.3–1.2)
Total Protein: 6.7 g/dL (ref 6.5–8.1)

## 2021-09-08 LAB — LIPID PANEL
Cholesterol: 148 mg/dL (ref 0–200)
HDL: 65 mg/dL (ref >40)
LDL Cholesterol: 71 mg/dL (ref 0–99)
Total CHOL/HDL Ratio: 2.3 RATIO
Triglycerides: 59 mg/dL (ref <150)
VLDL: 12 mg/dL (ref 0–40)

## 2021-09-08 LAB — VITAMIN B12: Vitamin B-12: 1086 pg/mL — ABNORMAL HIGH (ref 180–914)

## 2021-09-08 NOTE — Discharge Summary (Addendum)
Physician Discharge Summary  [name] SWF:[id number] DOB: January 05, 1966 DOA: 09/06/2021  PCP: [name], NP  Admit date: 09/06/2021 Discharge date: 09/08/2021  Admitted From: Home  Disposition:  Home   Recommendations for Outpatient Follow-up:  Follow up with PCP in 1-2 weeks Please obtain BMP/CBC in one week Follow up with PCP for further adjustment of BP mediations.  Follow up with neurology in 2 Months.    Home Health:none  Discharge Condition: Stable.  CODE STATUS: Full code Diet recommendation: Heart Healthy   Brief/Interim Summary: 55 year old with past medical history significant for HIV, recurrent CVA, thoracic aortic aneurysm, hypertension, hyperlipidemia who presented with concern of left-sided numbness and weakness.  She had a history of CVA with residual left-sided hemiparesis in 2012 recurrent stroke back in 2020 and had a loop recorder by cardiology but believes no arrhythmia was ever found.  Telemetry neuro consulted and recommendation given.  MRI of the brain and MRA head and neck are unremarkable.  Patient was transferred to [facility] for further evaluation by neurologist.  Patient was evaluated by the stroke team.  They recommended to continue with current medications and no further evaluation needed.  Patient will follow up with Dr. [name] in 55-month.  1-Worsening left-sided weakness with paresthesia of the left upper extremity:  TIA vs unmasking previous stroke symptoms setting stress, insomnia.  Improved.  She only have mild left hand weakness. MRI/MRA negative. LDL 71, hemoglobin A1c 5.4.  UDS negative. Patient was evaluated by PT OT no further recommendation needed. echo unremarkable Per neurology continue with current management Plavix and statins Counseling in regards smoking was provided to the patient. B-12 elevated.  Mild transaminases: Resolved.  Hypertension: Resume Norvasc at discharge.  Plan to hold hydrochlorothiazide for now blood  pressure has been in the 130s and 120s Resumption of hydrochlorothiazide as an outpatient. Hypokalemia: Corrected HIV: Continue with current medications. History of ascending thoracic aortic aneurysm: Follow-up as an outpatient   Discharge Diagnoses:  Principal Problem:   Paresthesia Active Problems:   Human immunodeficiency virus (HIV) disease ([place])   Essential hypertension   Aneurysm of thoracic aorta (HCC)   Left-sided weakness    Discharge Instructions  Discharge Instructions     Diet - low sodium heart healthy   Complete by: As directed    Increase activity slowly   Complete by: As directed       Allergies as of 09/08/2021       Reactions   Lisinopril Swelling        Medication List     STOP taking these medications    [employer] B-12 500 MCG tablet Generic drug: vitamin B-12   hydrochlorothiazide 25 MG tablet Commonly known as: HYDRODIURIL       TAKE these medications    albuterol 108 (90 Base) MCG/ACT inhaler Commonly known as: VENTOLIN HFA Inhale 2 puffs into the lungs every 4 (four) hours as needed for wheezing or shortness of breath (cough, shortness of breath or wheezing.).   amLODipine 10 MG tablet Commonly known as: NORVASC TAKE 1 TABLET BY MOUTH EVERY DAY   atorvastatin 40 MG tablet Commonly known as: LIPITOR TAKE 1 TABLET BY MOUTH DAILY AT 6 PM. What changed: when to take this   cetirizine 10 MG tablet Commonly known as: ZYRTEC TAKE 1 TABLET BY MOUTH EVERY DAY What changed:  when to take this reasons to take this   cimetidine 200 MG tablet Commonly known as: TAGAMET Take 1 tablet (200 mg total) by mouth  2 (two) times daily. What changed:  when to take this reasons to take this   clopidogrel 75 MG tablet Commonly known as: PLAVIX Take 75 mg by mouth daily.   Descovy 200-25 MG tablet Generic drug: emtricitabine-tenofovir AF Take 1 tablet by mouth daily.   fluticasone 50 MCG/ACT nasal spray Commonly known as: FLONASE SPRAY  2 SPRAYS INTO EACH NOSTRIL EVERY DAY   PARoxetine 30 MG tablet Commonly known as: PAXIL TAKE 1 TABLET BY MOUTH EVERY DAY   Tivicay 50 MG tablet Generic drug: dolutegravir TAKE 1 TABLET BY MOUTH EVERY DAY What changed: how much to take   traZODone 50 MG tablet Commonly known as: DESYREL TAKE 1/2 TABLETS BY MOUTH AT BEDTIME AS NEEDED FOR SLEEP.   Vitamin D (Ergocalciferol) 1.25 MG (50000 UNIT) Caps capsule Commonly known as: DRISDOL TAKE 1 CAPSULE BY MOUTH ONE TIME PER WEEK What changed: See the new instructions.        Follow-up Information     [name], NP Follow up in 1 week(s).   Specialty: Adult Health Nurse Practitioner Contact information: [address] [place] [address] [phone]         Carlyle Basques, MD Follow up.   Specialty: Infectious Diseases Contact information: [place] [address] [phone]         Garvin Fila, MD Follow up in 2 month(s).   Specialties: Neurology, Radiology Contact information: [address] [place] [address] [phone]                Allergies  Allergen Reactions   Lisinopril Swelling    Consultations: Neurology    Procedures/Studies: DG Chest 2 View  Result Date: 09/08/2021 CLINICAL DATA:  Numbness on left side. EXAM: CHEST - 2 VIEW COMPARISON:  03/28/19 FINDINGS: There is a loop recorder projecting over the left upper lobe. Mild cardiac enlargement. No pleural effusion or edema. No airspace opacities identified. Visualized osseous structures are notable for mild multilevel degenerative disc disease. IMPRESSION: No acute cardiopulmonary abnormalities. Electronically Signed   By: Kerby Moors M.D.   On: 09/08/2021 08:34   MR ANGIO HEAD WO CONTRAST  Result Date: 09/07/2021 CLINICAL DATA:  55 year old female with code stroke presentation. Neurologic deficit. Left extremity symptoms. EXAM: MRA HEAD WITHOUT CONTRAST TECHNIQUE:  Angiographic images of the Circle of Willis were acquired using MRA technique without intravenous contrast. COMPARISON:  Brain MRI today reported separately. CTA head and neck 03/27/2019. neck MRA today. FINDINGS: Antegrade flow in the posterior circulation with slightly dominant left vertebral artery. Patent PICA origins and V4 segments to the basilar without stenosis. Patent basilar artery without stenosis. Patent SCA and PCA origins. Posterior communicating arteries are diminutive or absent. Bilateral PCA branches are within normal limits. Antegrade flow in both ICA siphons. Tortuous ICAs just below the skull base. No siphon stenosis. Normal ophthalmic artery origins. Patent carotid termini, MCA and ACA origins. Diminutive or absent anterior communicating artery. Visible ACA branches are within normal limits, mildly motion degraded. Left MCA M1 segment and bifurcation are patent without stenosis. Left MCA branches appear stable and within normal limits. Right MCA M1 segment, bifurcation, and visible right MCA branches appear stable and within normal limits. IMPRESSION: Negative intracranial MRA. Electronically Signed   By: Genevie Ann M.D.   On: 09/07/2021 06:02   MR ANGIO NECK W WO CONTRAST  Result Date: 09/07/2021 CLINICAL DATA:  55 year old female with code stroke presentation. Neurologic deficit. Left extremity  symptoms. EXAM: MRA NECK WITHOUT AND WITH CONTRAST TECHNIQUE: Multiplanar and multiecho pulse sequences of the neck were obtained without and with intravenous contrast. Angiographic images of the neck were obtained using MRA technique without and with intravenous contrast. CONTRAST:  6.72mL GADAVIST GADOBUTROL 1 MMOL/ML IV SOLN COMPARISON:  CTA head and neck 03/27/2019. FINDINGS: Precontrast time-of-flight imaging demonstrates antegrade flow in the bilateral cervical carotid and vertebral arteries. Both vertebral arteries are tortuous, but appear codominant and patent to the skull base. Patent carotid  bifurcations and tortuous ICAs distal to the bulbs without evidence of hemodynamically significant stenosis. Postcontrast images demonstrate a 3 vessel arch configuration. Normal proximal great vessels. Mildly tortuous proximal right CCA. Negative right carotid bifurcation. Tortuous right ICA distal to the bulb without stenosis. Mildly tortuous left CCA without stenosis. Normal left carotid bifurcation. Tortuous cervical left ICA distal to the bulb without stenosis. Proximal subclavian arteries and vertebral artery origins appear normal. The left vertebral appears mildly dominant. Tortuous bilateral V1 and V2 segments. Both vertebral arteries appear patent without stenosis. Grossly negative visible intracranial circulation. See Head MRA today reported separately. IMPRESSION: Negative Neck MRA aside from generalized vessel tortuosity. Mildly dominant left vertebral artery. Electronically Signed   By: Genevie Ann M.D.   On: 09/07/2021 05:59   MR BRAIN WO CONTRAST  Result Date: 09/07/2021 CLINICAL DATA:  55 year old female with code stroke presentation. Neurologic deficit. Left extremity symptoms. EXAM: MRI HEAD WITHOUT CONTRAST TECHNIQUE: Multiplanar, multiecho pulse sequences of the brain and surrounding structures were obtained without intravenous contrast. COMPARISON:  Head CT 09/06/2021.  Brain MRI 03/28/2019. FINDINGS: Brain: No restricted diffusion or evidence of acute infarction. Chronic infarcts and encephalomalacia in the bilateral MCA territories, including perirolandic cortex on the right, parietal lobe and anterior frontal lobe on the left. Chronic infarct in the right cerebellum. Patchy and scattered bilateral cerebral white matter T2 and FLAIR hyperintensity has not significantly changed since 2020. No chronic cerebral blood products. And the deep gray nuclei, brainstem remain spared. No midline shift, mass effect, evidence of mass lesion, ventriculomegaly, extra-axial collection or acute intracranial  hemorrhage. Cervicomedullary junction and pituitary are within normal limits. Vascular: Major intracranial vascular flow voids are stable since 2020. Skull and upper cervical spine: Negative visible cervical spine. Normal bone marrow signal. Sinuses/Orbits: Negative orbits. Stable mild paranasal sinus mucosal thickening. Other: Mastoids remain clear. Grossly normal visible internal auditory structures. Negative visible scalp and face. IMPRESSION: 1. No acute intracranial abnormality. 2. 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 Electronically Signed   By: Genevie Ann M.D.   On: 09/07/2021 05:55   ECHOCARDIOGRAM COMPLETE  Result Date: 09/07/2021    ECHOCARDIOGRAM REPORT   Patient Name:   Abigail Medina Palazzo Date of Exam: 09/07/2021 Medical Rec #:  [id number]          Height:       63.0 in Accession #:    [id number]         Weight:       144.0 lb Date of Birth:  Oct 25, 1966          BSA:          1.682 m Patient Age:    55 years           BP:           110/83 mmHg Patient Gender: F  HR:           60 bpm. Exam Location:  Inpatient Procedure: 2D Echo, Color Doppler and Cardiac Doppler Indications:    Stroke I63.9  History:        Patient has prior history of Echocardiogram examinations, most                 recent 03/28/2019. Stroke; Risk Factors:Hypertension.  Sonographer:    Bernadene Person RDCS Referring Phys: [id number] [place]  1. Left ventricular ejection fraction, by estimation, is 60 to 65%. The left ventricle has normal function. The left ventricle has no regional wall motion abnormalities. Left ventricular diastolic parameters were normal.  2. Right ventricular systolic function is normal. The right ventricular size is normal. There is normal pulmonary artery systolic pressure.  3. The mitral valve is normal in structure. Trivial mitral valve regurgitation. No evidence of mitral stenosis.  4. The aortic valve is  tricuspid. Aortic valve regurgitation is not visualized. No aortic stenosis is present.  5. There is borderline dilatation of the ascending aorta, measuring 38 mm.  6. The inferior vena cava is normal in size with greater than 50% respiratory variability, suggesting right atrial pressure of 3 mmHg. Comparison(s): Changes from prior study are noted. Plaque in descending aorta noted on prior study; not well visualized on current study. Conclusion(s)/Recommendation(s): Normal biventricular function without evidence of hemodynamically significant valvular heart disease. No intracardiac source of embolism detected on this transthoracic study. A transesophageal echocardiogram is recommended to exclude cardiac source of embolism if clinically indicated. FINDINGS  Left Ventricle: Left ventricular ejection fraction, by estimation, is 60 to 65%. The left ventricle has normal function. The left ventricle has no regional wall motion abnormalities. The left ventricular internal cavity size was normal in size. There is  no left ventricular hypertrophy. Left ventricular diastolic parameters were normal. Right Ventricle: The right ventricular size is normal. Right vetricular wall thickness was not well visualized. Right ventricular systolic function is normal. There is normal pulmonary artery systolic pressure. The tricuspid regurgitant velocity is 1.95 m/s, and with an assumed right atrial pressure of 3 mmHg, the estimated right ventricular systolic pressure is 00.9 mmHg. Left Atrium: Left atrial size was normal in size. Right Atrium: Right atrial size was normal in size. Pericardium: There is no evidence of pericardial effusion. Mitral Valve: The mitral valve is normal in structure. Trivial mitral valve regurgitation. No evidence of mitral valve stenosis. Tricuspid Valve: The tricuspid valve is normal in structure. Tricuspid valve regurgitation is trivial. No evidence of tricuspid stenosis. Aortic Valve: The aortic valve is  tricuspid. Aortic valve regurgitation is not visualized. No aortic stenosis is present. Pulmonic Valve: The pulmonic valve was not well visualized. Pulmonic valve regurgitation is trivial. No evidence of pulmonic stenosis. Aorta: The aortic root, ascending aorta, aortic arch and descending aorta are all structurally normal, with no evidence of dilitation or obstruction. There is borderline dilatation of the ascending aorta, measuring 38 mm. Venous: The inferior vena cava is normal in size with greater than 50% respiratory variability, suggesting right atrial pressure of 3 mmHg. IAS/Shunts: No atrial level shunt detected by color flow Doppler.  LEFT VENTRICLE PLAX 2D LVIDd:         4.30 cm     Diastology LVIDs:         2.90 cm     LV e' medial:    6.12 cm/s LV PW:         0.80 cm  LV E/e' medial:  8.8 LV IVS:        0.80 cm     LV e' lateral:   9.76 cm/s LVOT diam:     2.10 cm     LV E/e' lateral: 5.5 LV SV:         66 LV SV Index:   39 LVOT Area:     3.46 cm  LV Volumes (MOD) LV vol d, MOD A2C: 62.7 ml LV vol d, MOD A4C: 62.3 ml LV vol s, MOD A2C: 24.4 ml LV vol s, MOD A4C: 28.2 ml LV SV MOD A2C:     38.3 ml LV SV MOD A4C:     62.3 ml LV SV MOD BP:      36.9 ml RIGHT VENTRICLE RV S prime:     10.90 cm/s TAPSE (M-mode): 2.2 cm LEFT ATRIUM             Index       RIGHT ATRIUM           Index LA diam:        2.30 cm 1.37 cm/m  RA Area:     13.70 cm LA Vol (A2C):   28.2 ml 16.77 ml/m RA Volume:   35.40 ml  21.05 ml/m LA Vol (A4C):   23.3 ml 13.85 ml/m LA Biplane Vol: 26.5 ml 15.76 ml/m  AORTIC VALVE LVOT Vmax:   96.30 cm/s LVOT Vmean:  60.600 cm/s LVOT VTI:    0.191 m  AORTA Ao Root diam: 3.40 cm Ao Asc diam:  3.80 cm MITRAL VALVE               TRICUSPID VALVE MV Area (PHT): 2.11 cm    TR Peak grad:   15.2 mmHg MV Decel Time: 359 msec    TR Vmax:        195.00 cm/s MV E velocity: 54.10 cm/s MV A velocity: 66.40 cm/s  SHUNTS MV E/A ratio:  0.81        Systemic VTI:  0.19 m                            Systemic  Diam: 2.10 cm Buford Dresser MD Electronically signed by Buford Dresser MD Signature Date/Time: 09/07/2021/1:53:42 PM    Final    CT HEAD CODE STROKE WO CONTRAST  Result Date: 09/06/2021 CLINICAL DATA:  Code stroke. Initial evaluation for facial numbness and tingling as well as within the left leg and hand. EXAM: CT HEAD WITHOUT CONTRAST TECHNIQUE: Contiguous axial images were obtained from the base of the skull through the vertex without intravenous contrast. COMPARISON:  Prior study from 03/28/2019 FINDINGS: Brain: Age-related cerebral atrophy with chronic microvascular ischemic disease. Multiple remote cortical/subcortical infarcts noted involving the right frontal, left frontal, and left parietal lobes. Small remote right cerebellar infarct. No acute intracranial hemorrhage. No acute large vessel territory infarct. No mass lesion, midline shift or mass effect. No hydrocephalus or extra-axial fluid collection. Vascular: No hyperdense vessel. Scattered vascular calcifications noted within the carotid siphons. Skull: Scalp soft tissues faint calvarium within normal limits. Sinuses/Orbits: Globes and orbital soft tissues demonstrate no acute finding. Mild mucoperiosteal thickening noted within the ethmoidal air cells. Paranasal sinuses are otherwise clear. No mastoid effusion. Other: None. ASPECTS [facility] Stroke Program Early CT Score) - Ganglionic level infarction (caudate, lentiform nuclei, internal capsule, insula, M1-M3 cortex): 7 - Supraganglionic infarction (M4-M6 cortex): 3 Total score (0-10 with 10 being normal):  10 IMPRESSION: 1. No acute intracranial abnormality. 2. ASPECTS is 10. 3. Multiple chronic ischemic infarcts involving the bilateral frontal lobes, left parietal lobe, and right cerebellum. Results were called by telephone at the time of interpretation on 09/06/2021 at 8:44 pm to provider [facility] , who verbally acknowledged these results. Electronically Signed   By:  Jeannine Boga M.D.   On: 09/06/2021 20:47     Subjective: She is feeling better. Weakness has improved, still some weakness in her left hand  Discharge Exam: Vitals:   09/08/21 0834 09/08/21 1116  BP: (!) 117/94 (!) 130/97  Pulse: 67 62  Resp: 15 13  Temp: 98.4 F (36.9 C) 98.4 F (36.9 C)  SpO2: 100% 100%     General: Pt is alert, awake, not in acute distress Cardiovascular: RRR, S1/S2 +, no rubs, no gallops Respiratory: CTA bilaterally, no wheezing, no rhonchi Abdominal: Soft, NT, ND, bowel sounds + Extremities: no edema, no cyanosis    The results of significant diagnostics from this hospitalization (including imaging, microbiology, ancillary and laboratory) are listed below for reference.     Microbiology: Recent Results (from the past 240 hour(s))  Resp Panel by RT-PCR (Flu A&B, Covid) Nasopharyngeal Swab     Status: None   Collection Time: 09/06/21  8:17 PM   Specimen: Nasopharyngeal Swab; Nasopharyngeal(NP) swabs in vial transport medium  Result Value Ref Range Status   SARS Coronavirus 2 by RT PCR NEGATIVE NEGATIVE Final    Comment: (NOTE) SARS-CoV-2 target nucleic acids are NOT DETECTED.  The SARS-CoV-2 RNA is generally detectable in upper respiratory specimens during the acute phase of infection. The lowest concentration of SARS-CoV-2 viral copies this assay can detect is 138 copies/mL. A negative result does not preclude SARS-Cov-2 infection and should not be used as the sole basis for treatment or other patient management decisions. A negative result may occur with  improper specimen collection/handling, submission of specimen other than nasopharyngeal swab, presence of viral mutation(s) within the areas targeted by this assay, and inadequate number of viral copies(<138 copies/mL). A negative result must be combined with clinical observations, patient history, and epidemiological information. The expected result is Negative.  Fact Sheet for  Patients:  EntrepreneurPulse.com.au  Fact Sheet for Healthcare Providers:  IncredibleEmployment.be  This test is no t yet approved or cleared by the [place] FDA and  has been authorized for detection and/or diagnosis of SARS-CoV-2 by FDA under an Emergency Use Authorization (EUA). This EUA will remain  in effect (meaning this test can be used) for the duration of the COVID-19 declaration under Section 564(b)(1) of the Act, 21 U.S.C.section 360bbb-3(b)(1), unless the authorization is terminated  or revoked sooner.       Influenza A by PCR NEGATIVE NEGATIVE Final   Influenza B by PCR NEGATIVE NEGATIVE Final    Comment: (NOTE) The Xpert Xpress SARS-CoV-2/FLU/RSV plus assay is intended as an aid in the diagnosis of influenza from Nasopharyngeal swab specimens and should not be used as a sole basis for treatment. Nasal washings and aspirates are unacceptable for Xpert Xpress SARS-CoV-2/FLU/RSV testing.  Fact Sheet for Patients: EntrepreneurPulse.com.au  Fact Sheet for Healthcare Providers: IncredibleEmployment.be  This test is not yet approved or cleared by the [place] FDA and has been authorized for detection and/or diagnosis of SARS-CoV-2 by FDA under an Emergency Use Authorization (EUA). This EUA will remain in effect (meaning this test can be used) for the duration of the COVID-19 declaration under Section 564(b)(1) of the Act, 21 U.S.C.  section 360bbb-3(b)(1), unless the authorization is terminated or revoked.  Performed at [facility], [place] [address], [place],  [address]      Labs: BNP (last 3 results) No results for input(s): BNP in the last 8760 hours. Basic Metabolic Panel: Recent Labs  Lab 09/06/21 2042 09/06/21 2052 09/08/21 0623  NA 145 139 137  K 3.4* 3.3* 3.5  CL 104 102 104  CO2 28  --  26  GLUCOSE 98 92 96  BUN 15 13 10   CREATININE 0.87  0.80 0.86  CALCIUM 10.2  --  8.9   Liver Function Tests: Recent Labs  Lab 09/06/21 2042 09/08/21 0623  AST 42* 27  ALT 48* 37  ALKPHOS 84 62  BILITOT 1.6* 1.5*  PROT 8.9* 6.7  ALBUMIN 4.6 3.3*   No results for input(s): LIPASE, AMYLASE in the last 168 hours. No results for input(s): AMMONIA in the last 168 hours. CBC: Recent Labs  Lab 09/06/21 2042 09/06/21 2052 09/08/21 0623  WBC 3.3*  --  3.2*  NEUTROABS 1.5*  --  1.0*  HGB 15.4* 16.3* 14.5  HCT 45.4 48.0* 41.7  MCV 92.5  --  91.9  PLT 173  --  154   Cardiac Enzymes: No results for input(s): CKTOTAL, CKMB, CKMBINDEX, TROPONINI in the last 168 hours. BNP: Invalid input(s): POCBNP CBG: Recent Labs  Lab 09/06/21 2029  GLUCAP 92   D-Dimer No results for input(s): DDIMER in the last 72 hours. Hgb A1c Recent Labs    09/07/21 0424  HGBA1C 5.4   Lipid Profile Recent Labs    09/08/21 0623  CHOL 148  HDL 65  LDLCALC 71  TRIG 59  CHOLHDL 2.3   Thyroid function studies No results for input(s): TSH, T4TOTAL, T3FREE, THYROIDAB in the last 72 hours.  Invalid input(s): FREET3 Anemia work up Recent Labs    09/08/21 0857  VITAMINB12 1,086*   Urinalysis    Component Value Date/Time   COLORURINE YELLOW 09/06/2021 2100   APPEARANCEUR CLEAR 09/06/2021 2100   LABSPEC 1.015 09/06/2021 2100   PHURINE 7.5 09/06/2021 2100   GLUCOSEU NEGATIVE 09/06/2021 2100   HGBUR NEGATIVE 09/06/2021 2100   BILIRUBINUR NEGATIVE 09/06/2021 2100   BILIRUBINUR neg 08/02/2021 1152   KETONESUR NEGATIVE 09/06/2021 2100   PROTEINUR NEGATIVE 09/06/2021 2100   UROBILINOGEN 1.0 08/02/2021 1152   UROBILINOGEN 1.0 04/30/2017 1118   NITRITE NEGATIVE 09/06/2021 2100   LEUKOCYTESUR NEGATIVE 09/06/2021 2100   Sepsis Labs Invalid input(s): PROCALCITONIN,  WBC,  LACTICIDVEN Microbiology Recent Results (from the past 240 hour(s))  Resp Panel by RT-PCR (Flu A&B, Covid) Nasopharyngeal Swab     Status: None   Collection Time: 09/06/21   8:17 PM   Specimen: Nasopharyngeal Swab; Nasopharyngeal(NP) swabs in vial transport medium  Result Value Ref Range Status   SARS Coronavirus 2 by RT PCR NEGATIVE NEGATIVE Final    Comment: (NOTE) SARS-CoV-2 target nucleic acids are NOT DETECTED.  The SARS-CoV-2 RNA is generally detectable in upper respiratory specimens during the acute phase of infection. The lowest concentration of SARS-CoV-2 viral copies this assay can detect is 138 copies/mL. A negative result does not preclude SARS-Cov-2 infection and should not be used as the sole basis for treatment or other patient management decisions. A negative result may occur with  improper specimen collection/handling, submission of specimen other than nasopharyngeal swab, presence of viral mutation(s) within the areas targeted by this assay, and inadequate number of viral copies(<138 copies/mL). A negative result must be combined  with clinical observations, patient history, and epidemiological information. The expected result is Negative.  Fact Sheet for Patients:  [employer]repreneur[employer].au  Fact Sheet for Healthcare Providers:  IncredibleEmployment.be  This test is no t yet approved or cleared by the [place] FDA and  has been authorized for detection and/or diagnosis of SARS-CoV-2 by FDA under an Emergency Use Authorization (EUA). This EUA will remain  in effect (meaning this test can be used) for the duration of the COVID-19 declaration under Section 564(b)(1) of the Act, 21 U.S.C.section 360bbb-3(b)(1), unless the authorization is terminated  or revoked sooner.       Influenza A by PCR NEGATIVE NEGATIVE Final   Influenza B by PCR NEGATIVE NEGATIVE Final    Comment: (NOTE) The Xpert Xpress SARS-CoV-2/FLU/RSV plus assay is intended as an aid in the diagnosis of influenza from Nasopharyngeal swab specimens and should not be used as a sole basis for treatment. Nasal washings and aspirates  are unacceptable for Xpert Xpress SARS-CoV-2/FLU/RSV testing.  Fact Sheet for Patients: EntrepreneurPulse.com.au  Fact Sheet for Healthcare Providers: IncredibleEmployment.be  This test is not yet approved or cleared by the [place] FDA and has been authorized for detection and/or diagnosis of SARS-CoV-2 by FDA under an Emergency Use Authorization (EUA). This EUA will remain in effect (meaning this test can be used) for the duration of the COVID-19 declaration under Section 564(b)(1) of the Act, 21 U.S.C. section 360bbb-3(b)(1), unless the authorization is terminated or revoked.  Performed at [facility], [place] [address] [place], [place] [address]      Time coordinating discharge: 40 minutes  SIGNED:   Elmarie Shiley, MD  [facility]

## 2021-09-08 NOTE — Plan of Care (Signed)
  Problem: Education: Goal: Knowledge of General Education information will improve Description: Including pain rating scale, medication(s)/side effects and non-pharmacologic comfort measures Outcome: Adequate for Discharge   Problem: Health Behavior/Discharge Planning: Goal: Ability to manage health-related needs will improve Outcome: Adequate for Discharge   Problem: Clinical Measurements: Goal: Ability to maintain clinical measurements within normal limits will improve Outcome: Adequate for Discharge Goal: Will remain free from infection Outcome: Adequate for Discharge Goal: Diagnostic test results will improve Outcome: Adequate for Discharge Goal: Respiratory complications will improve Outcome: Adequate for Discharge Goal: Cardiovascular complication will be avoided Outcome: Adequate for Discharge   Problem: Activity: Goal: Risk for activity intolerance will decrease Outcome: Adequate for Discharge   Problem: Nutrition: Goal: Adequate nutrition will be maintained Outcome: Adequate for Discharge   Problem: Coping: Goal: Level of anxiety will decrease Outcome: Adequate for Discharge   Problem: Elimination: Goal: Will not experience complications related to bowel motility Outcome: Adequate for Discharge Goal: Will not experience complications related to urinary retention Outcome: Adequate for Discharge   Problem: Pain Managment: Goal: General experience of comfort will improve Outcome: Adequate for Discharge   Problem: Safety: Goal: Ability to remain free from injury will improve Outcome: Adequate for Discharge   Problem: Skin Integrity: Goal: Risk for impaired skin integrity will decrease Outcome: Adequate for Discharge   Problem: Education: Goal: Knowledge of disease or condition will improve Outcome: Adequate for Discharge Goal: Knowledge of secondary prevention will improve Outcome: Adequate for Discharge Goal: Knowledge of patient specific risk factors  addressed and post discharge goals established will improve Outcome: Adequate for Discharge   Problem: Coping: Goal: Will verbalize positive feelings about self Outcome: Adequate for Discharge Goal: Will identify appropriate support needs Outcome: Adequate for Discharge   Problem: Health Behavior/Discharge Planning: Goal: Ability to manage health-related needs will improve Outcome: Adequate for Discharge   Problem: Self-Care: Goal: Ability to participate in self-care as condition permits will improve Outcome: Adequate for Discharge Goal: Verbalization of feelings and concerns over difficulty with self-care will improve Outcome: Adequate for Discharge Goal: Ability to communicate needs accurately will improve Outcome: Adequate for Discharge   Problem: Nutrition: Goal: Risk of aspiration will decrease Outcome: Adequate for Discharge   Problem: Ischemic Stroke/TIA Tissue Perfusion: Goal: Complications of ischemic stroke/TIA will be minimized Outcome: Adequate for Discharge

## 2021-09-08 NOTE — Consult Note (Addendum)
Referring Physician: Dr Jeoffrey Massed    Reason for Consult: Stroke vs TIA  HPI: Abigail Medina is an 55 y.o. 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 She had a loop recorder placement by cardiology, but believes no arrhythmia was ever found. She was brought to [facility] ER by POV on 9/22 when she noted increased symptoms from her baseline left side deficits. ele neuro consulted and recommendations given. MRI brain and MRA head and neck were unremarkable. She was transferred to [facility] for further stroke/TIA work up. She is known to our service from previous hospitalizations and out pt f/u. Her symptoms are now back to baseline and she describes significant stress and insomnia at home which may have contributed to unmasking of prior stroke symptoms.  MRI scan of the brain does not show any acute infarct.  MR angiogram of brain and neck both showed no significant large vessel stenosis or occlusion.  Transthoracic echo showed ejection fraction of 60 to 65% without cardiac source of embolism.  LDL cholesterol 79 mg percent and hemoglobin A1c 5.1.  Date last known well: Date: 09/06/2021 Time last known well: 1500  tPA Given: no, outside of time window  Past Medical History Past Medical History:  Diagnosis Date   HIV (human immunodeficiency virus infection) ([place])    Hypertension    Seizures ([place])    Stroke ([place])    Vision abnormalities     Surgical History Past Surgical History:  Procedure Laterality Date   LOOP RECORDER INSERTION N/A 03/29/2019   Procedure: LOOP RECORDER INSERTION;  Surgeon: Constance Haw, MD;  Location: [facility];  Service: Cardiovascular;  Laterality: N/A;    Family History  Family History  Problem Relation Age of Onset   Hypertension Mother     Social History:   reports that she  has been smoking cigarettes. She started smoking about 5 years ago. She has been smoking an average of .5 packs per day. She has never used smokeless tobacco. She reports current alcohol use of about 60.0 standard drinks per week. She reports that she does not use drugs.  Allergies:  Allergies  Allergen Reactions   Lisinopril Swelling    Home Medications:  Medications Prior to Admission  Medication Sig Dispense Refill   albuterol (PROVENTIL HFA;VENTOLIN HFA) 108 (90 Base) MCG/ACT inhaler Inhale 2 puffs into the lungs every 4 (four) hours as needed for wheezing or shortness of breath (cough, shortness of breath or wheezing.). 1 Inhaler 6   amLODipine (NORVASC) 10 MG tablet TAKE 1 TABLET BY MOUTH EVERY DAY (Patient taking differently: Take 10 mg by mouth daily.) 90 tablet 3   atorvastatin (LIPITOR) 40 MG tablet TAKE 1 TABLET BY MOUTH DAILY AT 6 PM. (Patient taking differently: Take 40 mg by mouth daily.) 90 tablet 1   cetirizine (ZYRTEC) 10 MG tablet TAKE 1 TABLET BY MOUTH EVERY DAY (Patient taking differently: Take 10 mg by mouth daily as needed for allergies or rhinitis.) 90 tablet 3   cimetidine (TAGAMET) 200 MG tablet Take 1 tablet (200 mg total) by mouth 2 (two) times daily. (Patient taking differently: Take 200 mg by mouth 2 (two) times daily as needed (reflux).) 60 tablet 2   clopidogrel (PLAVIX) 75 MG tablet Take 75 mg by mouth daily.     [employer] B-12 500 MCG tablet TAKE 1 TABLET BY  MOUTH DAILY (Patient taking differently: Take 500 mcg by mouth daily.) 100 tablet 3   dolutegravir (TIVICAY) 50 MG tablet TAKE 1 TABLET BY MOUTH EVERY DAY (Patient taking differently: Take 50 mg by mouth daily.) 30 tablet 1   emtricitabine-tenofovir AF (DESCOVY) 200-25 MG tablet Take 1 tablet by mouth daily. 30 tablet 1   fluticasone (FLONASE) 50 MCG/ACT nasal spray SPRAY 2 SPRAYS INTO EACH NOSTRIL EVERY DAY 16 mL 1   hydrochlorothiazide (HYDRODIURIL) 25 MG tablet Take 1 tablet (25 mg total) by mouth daily. 30  tablet 3   PARoxetine (PAXIL) 30 MG tablet TAKE 1 TABLET BY MOUTH EVERY DAY (Patient taking differently: Take 30 mg by mouth daily.) 90 tablet 4   traZODone (DESYREL) 50 MG tablet TAKE 1/2 TABLETS BY MOUTH AT BEDTIME AS NEEDED FOR SLEEP. 30 tablet 5   Vitamin D, Ergocalciferol, (DRISDOL) 1.25 MG (50000 UNIT) CAPS capsule TAKE 1 CAPSULE BY MOUTH ONE TIME PER WEEK (Patient taking differently: Take 50,000 Units by mouth every 7 (seven) days. Mondays) 8 capsule 1    Hospital Medications   stroke: mapping our early stages of recovery book   Does not apply Once   atorvastatin  40 mg Oral Daily   clopidogrel  75 mg Oral Daily   dolutegravir  50 mg Oral Daily   emtricitabine-tenofovir AF  1 tablet Oral Daily   enoxaparin (LOVENOX) injection  40 mg Subcutaneous Q24H   PARoxetine  30 mg Oral Daily   [START ON 09/10/2021] Vitamin D (Ergocalciferol)  50,000 Units Oral Q7 days    ROS:  History obtained from the patient   General ROS: negative for - chills, fatigue, fever, night sweats, weight gain or weight loss Psychological ROS: negative for - behavioral disorder, hallucinations, memory difficulties, mood swings or suicidal ideation Ophthalmic ROS: negative for - blurry vision, double vision, eye pain or loss of vision ENT ROS: negative for - epistaxis, nasal discharge, oral lesions, sore throat, tinnitus or vertigo Allergy and Immunology ROS: negative for - hives or itchy/watery eyes Hematological and Lymphatic ROS: negative for - bleeding problems, bruising or swollen lymph nodes Endocrine ROS: negative for - galactorrhea, hair pattern changes, polydipsia/polyuria or temperature intolerance Respiratory ROS: negative for - cough, hemoptysis, shortness of breath or wheezing Cardiovascular ROS: negative for - chest pain, dyspnea on exertion, edema or irregular heartbeat Gastrointestinal ROS: negative for - abdominal pain, diarrhea, hematemesis, nausea/vomiting or stool incontinence Genito-Urinary  ROS: negative for - dysuria, hematuria, incontinence or urinary frequency/urgency Musculoskeletal ROS: negative for - joint swelling or muscular weakness Neurological ROS: as noted in HPI Dermatological ROS: negative for rash and skin lesion changes   Physical Examination:  Vitals:   09/08/21 0700 09/08/21 0735 09/08/21 0834 09/08/21 1116  BP:   (!) 117/94 (!) 130/97  Pulse:   67 62  Resp: 18 15 15 13   Temp:   98.4 F (36.9 C) 98.4 F (36.9 C)  TempSrc:   Oral Oral  SpO2:   100% 100%    General: Appears well-developed. No acute distress Psych: Affect appropriate to situation Eyes: No scleral injection HENT: No OP obstrucion Head: Normocephalic.  Cardiovascular: Normal rate and regular rhythm.  Respiratory: Effort normal and breath sounds normal to anterior ascultation GI: Soft.  No distension. There is no tenderness.  Skin: WDI  Neurological Examination Mental Status: Alert, oriented, thought content appropriate.  Speech fluent without evidence of aphasia. Able to follow 3 step commands without difficulty. Cranial Nerves: II: Visual fields grossly normal,  III,IV,  VI: ptosis not present, extra-ocular motions intact bilaterally, pupils equal, round, reactive to light and accommodation V,VII: smile asymmetric, mild left lower facial droop facial light touch sensation normal bilaterally VIII: hearing normal bilaterally IX,X: uvula rises symmetrically XI: bilateral shoulder shrug XII: midline tongue extension Motor: Right : Upper extremity   5/5    Left:     Upper extremity   5/5  Lower extremity   5/5     Lower extremity   5/5 Tone and bulk:normal tone throughout; no atrophy noted, but there is left hand finger contracture noted with decreased chronic finger ROM and fine motor coordination  Sensory: l subjective diminished touch and pinprick sensation on the left lower face, left upper and lower extremities with preserved position and vibration..   Cerebellar: normal  finger-to-nose, normal rapid alternating movements  Gait: did not test  LABORATORY STUDIES:  Basic Metabolic Panel: Recent Labs  Lab 09/06/21 2041-03-09 09/06/21 March 10, 2051 09/08/21 0623  NA 145 139 137  K 3.4* 3.3* 3.5  CL 104 102 104  CO2 28  --  26  GLUCOSE 98 92 96  BUN 15 13 10   CREATININE 0.87 0.80 0.86  CALCIUM 10.2  --  8.9    Liver Function Tests: Recent Labs  Lab 09/06/21 2042 09/08/21 0623  AST 42* 27  ALT 48* 37  ALKPHOS 84 62  BILITOT 1.6* 1.5*  PROT 8.9* 6.7  ALBUMIN 4.6 3.3*   No results for input(s): LIPASE, AMYLASE in the last 168 hours. No results for input(s): AMMONIA in the last 168 hours.  CBC: Recent Labs  Lab 09/06/21 03-09-2041 09/06/21 2051/03/10 09/08/21 0623  WBC 3.3*  --  3.2*  NEUTROABS 1.5*  --  1.0*  HGB 15.4* 16.3* 14.5  HCT 45.4 48.0* 41.7  MCV 92.5  --  91.9  PLT 173  --  154    Cardiac Enzymes: No results for input(s): CKTOTAL, CKMB, CKMBINDEX, TROPONINI in the last 168 hours.  BNP: Invalid input(s): POCBNP  CBG: Recent Labs  Lab 09/06/21 2029  GLUCAP 92    Microbiology:   Coagulation Studies: Recent Labs    09/06/21 March 09, 2041  LABPROT 12.1  INR 0.9    Urinalysis:  Recent Labs  Lab 09/06/21 2100  COLORURINE YELLOW  LABSPEC 1.015  PHURINE 7.5  GLUCOSEU NEGATIVE  HGBUR NEGATIVE  BILIRUBINUR NEGATIVE  KETONESUR NEGATIVE  PROTEINUR NEGATIVE  NITRITE NEGATIVE  LEUKOCYTESUR NEGATIVE    Lipid Panel:     Component Value Date/Time   CHOL 148 09/08/2021 0623   CHOL 146 08/09/2020 1121   TRIG 59 09/08/2021 0623   HDL 65 09/08/2021 0623   HDL 63 08/09/2020 1121   CHOLHDL 2.3 09/08/2021 0623   VLDL 12 09/08/2021 0623   LDLCALC 71 09/08/2021 0623   LDLCALC 83 04/17/2021 1146    HgbA1C:  Lab Results  Component Value Date   HGBA1C 5.4 09/07/2021    Urine Drug Screen:      Component Value Date/Time   LABOPIA NONE DETECTED 09/06/2021 03-09-2099   COCAINSCRNUR NONE DETECTED 09/06/2021 03/09/2099   LABBENZ NONE DETECTED  09/06/2021 03-10-99   AMPHETMU NONE DETECTED 09/06/2021 2099/03/09   THCU NONE DETECTED 09/06/2021 March 09, 2099   LABBARB NONE DETECTED 09/06/2021 Mar 09, 2099     Alcohol Level:  Recent Labs  Lab 09/06/21 Mar 09, 2041  ETH <10     IMAGING: DG Chest 2 View  Result Date: 09/08/2021 CLINICAL DATA:  Numbness on left side. EXAM: CHEST - 2 VIEW COMPARISON:  03/28/19 FINDINGS: There is  a loop recorder projecting over the left upper lobe. Mild cardiac enlargement. No pleural effusion or edema. No airspace opacities identified. Visualized osseous structures are notable for mild multilevel degenerative disc disease. IMPRESSION: No acute cardiopulmonary abnormalities. Electronically Signed   By: Kerby Moors M.D.   On: 09/08/2021 08:34   MR ANGIO HEAD WO CONTRAST  Result Date: 09/07/2021 CLINICAL DATA:  55 year old female with code stroke presentation. Neurologic deficit. Left extremity symptoms. EXAM: MRA HEAD WITHOUT CONTRAST TECHNIQUE: Angiographic images of the Circle of Willis were acquired using MRA technique without intravenous contrast. COMPARISON:  Brain MRI today reported separately. CTA head and neck 03/27/2019. neck MRA today. FINDINGS: Antegrade flow in the posterior circulation with slightly dominant left vertebral artery. Patent PICA origins and V4 segments to the basilar without stenosis. Patent basilar artery without stenosis. Patent SCA and PCA origins. Posterior communicating arteries are diminutive or absent. Bilateral PCA branches are within normal limits. Antegrade flow in both ICA siphons. Tortuous ICAs just below the skull base. No siphon stenosis. Normal ophthalmic artery origins. Patent carotid termini, MCA and ACA origins. Diminutive or absent anterior communicating artery. Visible ACA branches are within normal limits, mildly motion degraded. Left MCA M1 segment and bifurcation are patent without stenosis. Left MCA branches appear stable and within normal limits. Right MCA M1 segment, bifurcation, and visible  right MCA branches appear stable and within normal limits. IMPRESSION: Negative intracranial MRA. Electronically Signed   By: Genevie Ann M.D.   On: 09/07/2021 06:02   MR ANGIO NECK W WO CONTRAST  Result Date: 09/07/2021 CLINICAL DATA:  56 year old female with code stroke presentation. Neurologic deficit. Left extremity symptoms. EXAM: MRA NECK WITHOUT AND WITH CONTRAST TECHNIQUE: Multiplanar and multiecho pulse sequences of the neck were obtained without and with intravenous contrast. Angiographic images of the neck were obtained using MRA technique without and with intravenous contrast. CONTRAST:  6.26mL GADAVIST GADOBUTROL 1 MMOL/ML IV SOLN COMPARISON:  CTA head and neck 03/27/2019. FINDINGS: Precontrast time-of-flight imaging demonstrates antegrade flow in the bilateral cervical carotid and vertebral arteries. Both vertebral arteries are tortuous, but appear codominant and patent to the skull base. Patent carotid bifurcations and tortuous ICAs distal to the bulbs without evidence of hemodynamically significant stenosis. Postcontrast images demonstrate a 3 vessel arch configuration. Normal proximal great vessels. Mildly tortuous proximal right CCA. Negative right carotid bifurcation. Tortuous right ICA distal to the bulb without stenosis. Mildly tortuous left CCA without stenosis. Normal left carotid bifurcation. Tortuous cervical left ICA distal to the bulb without stenosis. Proximal subclavian arteries and vertebral artery origins appear normal. The left vertebral appears mildly dominant. Tortuous bilateral V1 and V2 segments. Both vertebral arteries appear patent without stenosis. Grossly negative visible intracranial circulation. See Head MRA today reported separately. IMPRESSION: Negative Neck MRA aside from generalized vessel tortuosity. Mildly dominant left vertebral artery. Electronically Signed   By: Genevie Ann M.D.   On: 09/07/2021 05:59   MR BRAIN WO CONTRAST  Result Date: 09/07/2021 CLINICAL DATA:   55 year old female with code stroke presentation. Neurologic deficit. Left extremity symptoms. EXAM: MRI HEAD WITHOUT CONTRAST TECHNIQUE: Multiplanar, multiecho pulse sequences of the brain and surrounding structures were obtained without intravenous contrast. COMPARISON:  Head CT 09/06/2021.  Brain MRI 03/28/2019. FINDINGS: Brain: No restricted diffusion or evidence of acute infarction. Chronic infarcts and encephalomalacia in the bilateral MCA territories, including perirolandic cortex on the right, parietal lobe and anterior frontal lobe on the left. Chronic infarct in the right cerebellum. Patchy and scattered bilateral cerebral white  matter T2 and FLAIR hyperintensity has not significantly changed since 2020. No chronic cerebral blood products. And the deep gray nuclei, brainstem remain spared. No midline shift, mass effect, evidence of mass lesion, ventriculomegaly, extra-axial collection or acute intracranial hemorrhage. Cervicomedullary junction and pituitary are within normal limits. Vascular: Major intracranial vascular flow voids are stable since 2020. Skull and upper cervical spine: Negative visible cervical spine. Normal bone marrow signal. Sinuses/Orbits: Negative orbits. Stable mild paranasal sinus mucosal thickening. Other: Mastoids remain clear. Grossly normal visible internal auditory structures. Negative visible scalp and face. IMPRESSION: 1. No acute intracranial abnormality. 2. 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 Electronically Signed   By: Genevie Ann M.D.   On: 09/07/2021 05:55   ECHOCARDIOGRAM COMPLETE  Result Date: 09/07/2021    ECHOCARDIOGRAM REPORT   Patient Name:   Abigail Medina Date of Exam: 09/07/2021 Medical Rec #:  [id number]          Height:       63.0 in Accession #:    [id number]         Weight:       144.0 lb Date of Birth:  [birth year]          BSA:          1.682 m Patient Age:    75 years            BP:           110/83 mmHg Patient Gender: F                  HR:           60 bpm. Exam Location:  Inpatient Procedure: 2D Echo, Color Doppler and Cardiac Doppler Indications:    Stroke I63.9  History:        Patient has prior history of Echocardiogram examinations, most                 recent 03/28/2019. Stroke; Risk Factors:Hypertension.  Sonographer:    Bernadene Person RDCS Referring Phys: [id number] [place]  1. Left ventricular ejection fraction, by estimation, is 60 to 65%. The left ventricle has normal function. The left ventricle has no regional wall motion abnormalities. Left ventricular diastolic parameters were normal.  2. Right ventricular systolic function is normal. The right ventricular size is normal. There is normal pulmonary artery systolic pressure.  3. The mitral valve is normal in structure. Trivial mitral valve regurgitation. No evidence of mitral stenosis.  4. The aortic valve is tricuspid. Aortic valve regurgitation is not visualized. No aortic stenosis is present.  5. There is borderline dilatation of the ascending aorta, measuring 38 mm.  6. The inferior vena cava is normal in size with greater than 50% respiratory variability, suggesting right atrial pressure of 3 mmHg. Comparison(s): Changes from prior study are noted. Plaque in descending aorta noted on prior study; not well visualized on current study. Conclusion(s)/Recommendation(s): Normal biventricular function without evidence of hemodynamically significant valvular heart disease. No intracardiac source of embolism detected on this transthoracic study. A transesophageal echocardiogram is recommended to exclude cardiac source of embolism if clinically indicated. FINDINGS  Left Ventricle: Left ventricular ejection fraction, by estimation, is 60 to 65%. The left ventricle has normal function. The left ventricle has no regional wall motion abnormalities. The left ventricular internal cavity size was normal in size. There is  no  left ventricular hypertrophy. Left ventricular diastolic parameters  were normal. Right Ventricle: The right ventricular size is normal. Right vetricular wall thickness was not well visualized. Right ventricular systolic function is normal. There is normal pulmonary artery systolic pressure. The tricuspid regurgitant velocity is 1.95 m/s, and with an assumed right atrial pressure of 3 mmHg, the estimated right ventricular systolic pressure is 64.3 mmHg. Left Atrium: Left atrial size was normal in size. Right Atrium: Right atrial size was normal in size. Pericardium: There is no evidence of pericardial effusion. Mitral Valve: The mitral valve is normal in structure. Trivial mitral valve regurgitation. No evidence of mitral valve stenosis. Tricuspid Valve: The tricuspid valve is normal in structure. Tricuspid valve regurgitation is trivial. No evidence of tricuspid stenosis. Aortic Valve: The aortic valve is tricuspid. Aortic valve regurgitation is not visualized. No aortic stenosis is present. Pulmonic Valve: The pulmonic valve was not well visualized. Pulmonic valve regurgitation is trivial. No evidence of pulmonic stenosis. Aorta: The aortic root, ascending aorta, aortic arch and descending aorta are all structurally normal, with no evidence of dilitation or obstruction. There is borderline dilatation of the ascending aorta, measuring 38 mm. Venous: The inferior vena cava is normal in size with greater than 50% respiratory variability, suggesting right atrial pressure of 3 mmHg. IAS/Shunts: No atrial level shunt detected by color flow Doppler.  LEFT VENTRICLE PLAX 2D LVIDd:         4.30 cm     Diastology LVIDs:         2.90 cm     LV e' medial:    6.12 cm/s LV PW:         0.80 cm     LV E/e' medial:  8.8 LV IVS:        0.80 cm     LV e' lateral:   9.76 cm/s LVOT diam:     2.10 cm     LV E/e' lateral: 5.5 LV SV:         66 LV SV Index:   39 LVOT Area:     3.46 cm  LV Volumes (MOD) LV vol d, MOD A2C: 62.7 ml LV vol  d, MOD A4C: 62.3 ml LV vol s, MOD A2C: 24.4 ml LV vol s, MOD A4C: 28.2 ml LV SV MOD A2C:     38.3 ml LV SV MOD A4C:     62.3 ml LV SV MOD BP:      36.9 ml RIGHT VENTRICLE RV S prime:     10.90 cm/s TAPSE (M-mode): 2.2 cm LEFT ATRIUM             Index       RIGHT ATRIUM           Index LA diam:        2.30 cm 1.37 cm/m  RA Area:     13.70 cm LA Vol (A2C):   28.2 ml 16.77 ml/m RA Volume:   35.40 ml  21.05 ml/m LA Vol (A4C):   23.3 ml 13.85 ml/m LA Biplane Vol: 26.5 ml 15.76 ml/m  AORTIC VALVE LVOT Vmax:   96.30 cm/s LVOT Vmean:  60.600 cm/s LVOT VTI:    0.191 m  AORTA Ao Root diam: 3.40 cm Ao Asc diam:  3.80 cm MITRAL VALVE               TRICUSPID VALVE MV Area (PHT): 2.11 cm    TR Peak grad:   15.2 mmHg MV Decel Time: 359 msec    TR Vmax:  195.00 cm/s MV E velocity: 54.10 cm/s MV A velocity: 66.40 cm/s  SHUNTS MV E/A ratio:  0.81        Systemic VTI:  0.19 m                            Systemic Diam: 2.10 cm Buford Dresser MD Electronically signed by Buford Dresser MD Signature Date/Time: 09/07/2021/1:53:42 PM    Final    CT HEAD CODE STROKE WO CONTRAST  Result Date: 09/06/2021 CLINICAL DATA:  Code stroke. Initial evaluation for facial numbness and tingling as well as within the left leg and hand. EXAM: CT HEAD WITHOUT CONTRAST TECHNIQUE: Contiguous axial images were obtained from the base of the skull through the vertex without intravenous contrast. COMPARISON:  Prior study from 03/28/2019 FINDINGS: Brain: Age-related cerebral atrophy with chronic microvascular ischemic disease. Multiple remote cortical/subcortical infarcts noted involving the right frontal, left frontal, and left parietal lobes. Small remote right cerebellar infarct. No acute intracranial hemorrhage. No acute large vessel territory infarct. No mass lesion, midline shift or mass effect. No hydrocephalus or extra-axial fluid collection. Vascular: No hyperdense vessel. Scattered vascular calcifications noted within the  carotid siphons. Skull: Scalp soft tissues faint calvarium within normal limits. Sinuses/Orbits: Globes and orbital soft tissues demonstrate no acute finding. Mild mucoperiosteal thickening noted within the ethmoidal air cells. Paranasal sinuses are otherwise clear. No mastoid effusion. Other: None. ASPECTS [facility] Stroke Program Early CT Score) - Ganglionic level infarction (caudate, lentiform nuclei, internal capsule, insula, M1-M3 cortex): 7 - Supraganglionic infarction (M4-M6 cortex): 3 Total score (0-10 with 10 being normal): 10 IMPRESSION: 1. No acute intracranial abnormality. 2. ASPECTS is 10. 3. Multiple chronic ischemic infarcts involving the bilateral frontal lobes, left parietal lobe, and right cerebellum. Results were called by telephone at the time of interpretation on 09/06/2021 at 8:44 pm to provider [facility] , who verbally acknowledged these results. Electronically Signed   By: Jeannine Boga M.D.   On: 09/06/2021 20:47    Assessment:  Ms. Ande Therrell is a 55 y.o. 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 She had a loop recorder placement by cardiology, but believes no arrhythmia was ever found. She was brought to [facility] ER by POV on 9/22 when she noted increased symptoms from her baseline left side deficits. ele neuro consulted and recommendations given. MRI brain and MRA head and neck were unremarkable. She was transferred to [facility] for further stroke/TIA work up. She is known to our service from previous hospitalizations and out pt f/u. Her symptoms are now back to baseline and she describes significant stress and insomnia at home which may have contributed to unmasking of prior stroke symptoms.   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 Aspects 10. Old infarcts noted. MRI head - No acute findings CTA H&N - No LVO. Mild stable intracranial atherosclerosis. 2D Echo - EF 65%, wnl LDL - 71    Component Value Date/Time   LDLCALC 71 09/08/2021 0623   LDLCALC 83 04/17/2021 1146   HgbA1c - 5.4 UDS Neg VTE prophylaxis - Lovenox Diet  Diet Order  Diet Heart Room service appropriate? Yes; Fluid consistency: Thin  Diet effective now                  clopidogrel 75 mg daily prior to admission, now on clopidogrel 75 mg daily Patient counseled to be compliant with her antithrombotic medications Ongoing aggressive stroke risk factor management Therapy recommendations:  pending Disposition:  Ok to return home since she is back to baseline debility  Hypertension Home BP meds: norvasc, hydrodiuril Current BP meds: same Stable No role in Permissive hypertension  Long-term BP goal normotensive  Hyperlipidemia Home Lipid lowering medication: Lipitor 40mg  po daily LDL 71, goal < 70 Current lipid lowering medication as at home  Continue statin at discharge  No dx of Diabetes  HgbA1c 5.4, goal < 7.0 Recent Labs    09/06/21 2029  GLUCAP 92    Other Stroke Risk Factors Cigarette smoker; advised to stop smoking ETOH use, advised to stop Hx stroke/TIA Family hx stroke   Other Active Problems, Findings and Recommendations HIV Depression/Anxiety  Plan: At this time, the stroke work up is completed and neg for anything new. No change to current home medicaitons. She should have f/u with Valeria Batman NP visit in 85mo. [place] for d/c back home from neuro stand point. I have communicated this with the primary team.    Desiree Metzger-Cihelka, ARNP-C, ANVP-BC Pager: [phone]    STROKE MD NOTE : I have personally obtained history,examined this patient, reviewed notes, independently viewed imaging studies,  participated in medical decision making and plan of care.ROS completed by me personally and pertinent positives fully documented  I have made any additions or clarifications directly to the above note. Agree with note above.  She presented with subjective worsening of her existing deficits of left-sided paresthesias which now appear to have improved.  There appeared to be no obvious triggers except underlying psychosocial stress.  MRI is negative for acute stroke and stroke work-up is completed.  Continue prior home antiplatelet agent and maintain aggressive risk factor modification.  Follow-up as an outpatient with stroke clinic with [place] practitioner in 2 months.  Discussed with Dr. Jerald Kief. Greater than 50% time during this 35-minute visit was spent on counseling and coordination of care and discussion with patient and daughter and daughter-in-law about stroke prevention and answering questions Questions Antony Contras, MD Medical Director [place] Pager: [phone] 09/08/2021 4:13 PM

## 2021-09-09 ENCOUNTER — Other Ambulatory Visit: Payer: Self-pay | Admitting: Nurse Practitioner

## 2021-09-14 ENCOUNTER — Ambulatory Visit: Payer: Medicare Other | Admitting: Nurse Practitioner

## 2021-09-17 ENCOUNTER — Other Ambulatory Visit: Payer: Self-pay | Admitting: Internal Medicine

## 2021-09-17 DIAGNOSIS — B20 Human immunodeficiency virus [HIV] disease: Secondary | ICD-10-CM

## 2021-09-20 ENCOUNTER — Ambulatory Visit (INDEPENDENT_AMBULATORY_CARE_PROVIDER_SITE_OTHER): Payer: Medicare Other | Admitting: Nurse Practitioner

## 2021-09-20 ENCOUNTER — Other Ambulatory Visit: Payer: Self-pay

## 2021-09-20 ENCOUNTER — Other Ambulatory Visit: Payer: Self-pay | Admitting: Nurse Practitioner

## 2021-09-20 ENCOUNTER — Encounter: Payer: Self-pay | Admitting: Nurse Practitioner

## 2021-09-20 VITALS — BP 122/84 | HR 100 | Temp 97.4°F | Ht 63.0 in | Wt 150.0 lb

## 2021-09-20 DIAGNOSIS — R202 Paresthesia of skin: Secondary | ICD-10-CM | POA: Diagnosis not present

## 2021-09-20 DIAGNOSIS — I10 Essential (primary) hypertension: Secondary | ICD-10-CM

## 2021-09-20 DIAGNOSIS — Z8639 Personal history of other endocrine, nutritional and metabolic disease: Secondary | ICD-10-CM

## 2021-09-20 DIAGNOSIS — R200 Anesthesia of skin: Secondary | ICD-10-CM | POA: Diagnosis not present

## 2021-09-20 DIAGNOSIS — R21 Rash and other nonspecific skin eruption: Secondary | ICD-10-CM

## 2021-09-20 DIAGNOSIS — M79674 Pain in right toe(s): Secondary | ICD-10-CM | POA: Diagnosis not present

## 2021-09-20 MED ORDER — PAROXETINE HCL ER 12.5 MG PO TB24: 12.5000 mg | ORAL_TABLET | Freq: Every day | ORAL | 11 refills | Status: DC

## 2021-09-20 MED ORDER — TRIAMCINOLONE ACETONIDE 0.5 % EX OINT: 1.0000 "application " | TOPICAL_OINTMENT | Freq: Two times a day (BID) | CUTANEOUS | 2 refills | Status: AC

## 2021-09-20 NOTE — Progress Notes (Signed)
[place], [place]  [address] Phone:  [phone]   Fax:  [phone]   Established Patient Office Visit  Subjective:  Patient ID: Abigail Medina, female    DOB: [birth year]  Age: 55 y.o. MRN: [id number]  CC:  Chief Complaint  Patient presents with   Hospitalization Follow-up    09/06/2021 - 09/08/2021 ; CVA. Was taken off Vitamin B and hydrochlorothiazide,     HPI Abigail Medina presents for follow up. She  has a past medical history of HIV (human immunodeficiency virus infection) ([place]), Hypertension, Seizures ([place]), Stroke ([place]), and Vision abnormalities.   She was seen the ED for stroke like symptoms. She was having numbness and tingling in her left hand and face. The numbness was getting worse. She feels these symptoms have improved. She does continue to experience anxiety due to the loss of spouse.   She continues to have rash inbetween her hands.   She continues to have pain and swelling in her second toe on her right foot after injury.  Past Medical History:  Diagnosis Date   HIV (human immunodeficiency virus infection) ([place])    Hypertension    Seizures ([place])    Stroke ([place])    Vision abnormalities     Past Surgical History:  Procedure Laterality Date   LOOP RECORDER INSERTION N/A 03/29/2019   Procedure: LOOP RECORDER INSERTION;  Surgeon: Constance Haw, MD;  Location: [facility];  Service: Cardiovascular;  Laterality: N/A;    Family History  Problem Relation Age of Onset   Hypertension Mother     Social History   Socioeconomic History   Marital status: Single    Spouse name: Not on file   Number of children: Not on file   Years of education: Not on file   Highest education level: Not on file  Occupational History   Not on file  Tobacco Use   Smoking status: Every Day    Packs/day: 0.50    Types: Cigarettes    Start date: 10/17/2015    Last attempt to quit: 08/10/2019    Years since  quitting: 2.1   Smokeless tobacco: Never   Tobacco comments:    picked up smoking after her husband died 03-08-2021  Vaping Use   Vaping Use: Never used  Substance and Sexual Activity   Alcohol use: Yes    Alcohol/week: 60.0 standard drinks    Types: 60 Standard drinks or equivalent per week    Comment: "stayed drunk" after her husband passed unexpectedly 03-09-2023, is slowing down   Drug use: No   Sexual activity: Not Currently    Partners: Male    Birth control/protection: Condom    Comment: offered condoms  Other Topics Concern   Not on file  Social History Narrative   Not on file   Social Determinants of Health   Financial Resource Strain: Not on file  Food Insecurity: Not on file  Transportation Needs: Not on file  Physical Activity: Not on file  Stress: Not on file  Social Connections: Not on file  Intimate Partner Violence: Not on file    Outpatient Medications Prior to Visit  Medication Sig Dispense Refill   albuterol (PROVENTIL HFA;VENTOLIN HFA) 108 (90 Base) MCG/ACT inhaler Inhale 2 puffs into the lungs every 4 (four) hours as needed for wheezing or shortness of breath (cough, shortness of breath or wheezing.). 1 Inhaler 6   amLODipine (NORVASC) 10 MG tablet TAKE  1 TABLET BY MOUTH EVERY DAY (Patient taking differently: Take 10 mg by mouth daily.) 90 tablet 3   atorvastatin (LIPITOR) 40 MG tablet TAKE 1 TABLET BY MOUTH DAILY AT 6 PM. (Patient taking differently: Take 40 mg by mouth daily.) 90 tablet 1   cetirizine (ZYRTEC) 10 MG tablet TAKE 1 TABLET BY MOUTH EVERY DAY (Patient taking differently: Take 10 mg by mouth daily as needed for allergies or rhinitis.) 90 tablet 3   clopidogrel (PLAVIX) 75 MG tablet TAKE 1 TABLET BY MOUTH EVERY DAY 90 tablet 3   dolutegravir (TIVICAY) 50 MG tablet TAKE 1 TABLET BY MOUTH EVERY DAY (Patient taking differently: Take 50 mg by mouth daily.) 30 tablet 1   emtricitabine-tenofovir AF (DESCOVY) 200-25 MG tablet Take 1 tablet by mouth daily. 30  tablet 1   fluticasone (FLONASE) 50 MCG/ACT nasal spray SPRAY 2 SPRAYS INTO EACH NOSTRIL EVERY DAY 16 mL 1   traZODone (DESYREL) 50 MG tablet TAKE 1/2 TABLETS BY MOUTH AT BEDTIME AS NEEDED FOR SLEEP. 30 tablet 5   Vitamin D, Ergocalciferol, (DRISDOL) 1.25 MG (50000 UNIT) CAPS capsule TAKE 1 CAPSULE BY MOUTH ONE TIME PER WEEK (Patient taking differently: Take 50,000 Units by mouth every 7 (seven) days. Mondays) 8 capsule 1   PARoxetine (PAXIL) 30 MG tablet TAKE 1 TABLET BY MOUTH EVERY DAY (Patient taking differently: Take 30 mg by mouth daily.) 90 tablet 4   cimetidine (TAGAMET) 200 MG tablet Take 1 tablet (200 mg total) by mouth 2 (two) times daily. (Patient taking differently: Take 200 mg by mouth 2 (two) times daily as needed (reflux).) 60 tablet 2   No facility-administered medications prior to visit.    Allergies  Allergen Reactions   Lisinopril Swelling    ROS Review of Systems    Objective:    Physical Exam HENT:     Head: Normocephalic and atraumatic.     Nose: Nose normal.  Cardiovascular:     Rate and Rhythm: Normal rate and regular rhythm.     Pulses: Normal pulses.     Heart sounds: Normal heart sounds.  Pulmonary:     Effort: Pulmonary effort is normal.     Breath sounds: Normal breath sounds.  Abdominal:     Palpations: Abdomen is soft.  Musculoskeletal:        General: Swelling (right foot 2nd toe) present. Normal range of motion.     Cervical back: Normal range of motion.     Right lower leg: No edema.  Skin:    General: Skin is warm.     Capillary Refill: Capillary refill takes less than 2 seconds.     Findings: Rash (Right hand and between fingers 3 and 4) present.     Comments: Slight change in pigmentation lower extremities  Neurological:     General: No focal deficit present.     Mental Status: She is alert and oriented to person, place, and time.  Psychiatric:        Mood and Affect: Mood normal.        Behavior: Behavior normal.        Thought  Content: Thought content normal.        Judgment: Judgment normal.    BP 122/84   Pulse 100   Temp (!) 97.4 F (36.3 C)   Ht 5\' 3"  (1.6 m)   Wt 150 lb 0.4 oz (68.1 kg)   LMP 08/04/2014   SpO2 99%   BMI 26.58 kg/m  Wt Readings  from Last 3 Encounters:  09/20/21 150 lb 0.4 oz (68.1 kg)  08/02/21 144 lb 0.6 oz (65.3 kg)  05/07/21 145 lb (65.8 kg)     Health Maintenance Due  Topic Date Due   COVID-19 Vaccine (1) Never done   MAMMOGRAM  06/09/2021   INFLUENZA VACCINE  07/16/2021    There are no preventive care reminders to display for this patient.  Lab Results  Component Value Date   TSH 3.152 03/28/2019   Lab Results  Component Value Date   WBC 3.2 (L) 09/08/2021   HGB 14.5 09/08/2021   HCT 41.7 09/08/2021   MCV 91.9 09/08/2021   PLT 154 09/08/2021   Lab Results  Component Value Date   NA 137 09/08/2021   K 3.5 09/08/2021   CO2 26 09/08/2021   GLUCOSE 96 09/08/2021   BUN 10 09/08/2021   CREATININE 0.86 09/08/2021   BILITOT 1.5 (H) 09/08/2021   ALKPHOS 62 09/08/2021   AST 27 09/08/2021   ALT 37 09/08/2021   PROT 6.7 09/08/2021   ALBUMIN 3.3 (L) 09/08/2021   CALCIUM 8.9 09/08/2021   ANIONGAP 7 09/08/2021   Lab Results  Component Value Date   CHOL 148 09/08/2021   Lab Results  Component Value Date   HDL 65 09/08/2021   Lab Results  Component Value Date   LDLCALC 71 09/08/2021   Lab Results  Component Value Date   TRIG 59 09/08/2021   Lab Results  Component Value Date   CHOLHDL 2.3 09/08/2021   Lab Results  Component Value Date   HGBA1C 5.4 09/07/2021      Assessment & Plan:   Problem List Items Addressed This Visit       Cardiovascular and Mediastinum   Essential hypertension Stable  Encouraged on going compliance with current medication regimen Encouraged home monitoring and recording BP <130/80 Eating a heart-healthy diet with less salt Encouraged regular physical activity     Relevant Orders   Comp. Metabolic Panel (12)    Other Visit Diagnoses     Hx of hypokalemia    -  Primary Labs pending   Numbness and tingling       Pain in right toe(s)     Persistent   Relevant Orders   DG Foot Complete Right   Rash and nonspecific skin eruption  Persistent Kenalog cream           Meds ordered this encounter  Medications   PARoxetine (PAXIL CR) 12.5 MG 24 hr tablet    Sig: Take 1 tablet (12.5 mg total) by mouth daily.    Dispense:  30 tablet    Refill:  11    Order Specific Question:   Supervising Provider    Answer:   Tresa Garter [[id number]]   triamcinolone ointment (KENALOG) 0.5 %    Sig: Apply 1 application topically 2 (two) times daily.    Dispense:  30 g    Refill:  2    Order Specific Question:   Supervising Provider    Answer:   Tresa Garter [id number]    Follow-up: Return in about 4 weeks (around 10/18/2021) for Follow-up for anxiety [id number].    [name], NP

## 2021-09-20 NOTE — Patient Instructions (Signed)

## 2021-10-18 ENCOUNTER — Ambulatory Visit: Payer: Medicare Other | Admitting: Nurse Practitioner

## 2021-10-27 ENCOUNTER — Other Ambulatory Visit: Payer: Self-pay | Admitting: Nurse Practitioner

## 2021-10-29 ENCOUNTER — Ambulatory Visit (INDEPENDENT_AMBULATORY_CARE_PROVIDER_SITE_OTHER): Payer: Medicare Other

## 2021-10-29 DIAGNOSIS — I63132 Cerebral infarction due to embolism of left carotid artery: Secondary | ICD-10-CM

## 2021-10-30 LAB — CUP PACEART REMOTE DEVICE CHECK
Date Time Interrogation Session: 20221112031652
Implantable Pulse Generator Implant Date: 20200413

## 2021-11-02 ENCOUNTER — Ambulatory Visit (INDEPENDENT_AMBULATORY_CARE_PROVIDER_SITE_OTHER): Payer: Medicare Other | Admitting: Nurse Practitioner

## 2021-11-02 ENCOUNTER — Encounter: Payer: Self-pay | Admitting: Nurse Practitioner

## 2021-11-02 ENCOUNTER — Other Ambulatory Visit: Payer: Self-pay

## 2021-11-02 ENCOUNTER — Ambulatory Visit: Payer: Medicare Other | Admitting: Nurse Practitioner

## 2021-11-02 VITALS — BP 128/97 | Temp 97.2°F | Ht 63.0 in | Wt 150.0 lb

## 2021-11-02 DIAGNOSIS — I10 Essential (primary) hypertension: Secondary | ICD-10-CM

## 2021-11-02 LAB — POCT URINALYSIS DIP (CLINITEK)
Bilirubin, UA: NEGATIVE
Blood, UA: NEGATIVE
Glucose, UA: NEGATIVE mg/dL
Ketones, POC UA: NEGATIVE mg/dL
Leukocytes, UA: NEGATIVE
Nitrite, UA: NEGATIVE
POC PROTEIN,UA: 30 — AB
Spec Grav, UA: 1.02 (ref 1.010–1.025)
Urobilinogen, UA: >=8 E.U./dL — AB
pH, UA: 7 (ref 5.0–8.0)

## 2021-11-02 MED ORDER — HYDROCHLOROTHIAZIDE 25 MG PO TABS: 25.0000 mg | ORAL_TABLET | Freq: Every day | ORAL | 3 refills | Status: DC

## 2021-11-02 NOTE — Patient Instructions (Addendum)
Managing Your Hypertension Hypertension, also called high blood pressure, is when the force of the blood pressing against the walls of the arteries is too strong. Arteries are blood vessels that carry blood from your heart throughout your body. Hypertension forces the heart to work harder to pump blood and may cause the arteries to become narrow or stiff. Understanding blood pressure readings Your personal target blood pressure may vary depending on your medical conditions, your age, and other factors. A blood pressure reading includes a higher number over a lower number. Ideally, your blood pressure should be below 120/80. You should know that: The first, or top, number is called the systolic pressure. It is a measure of the pressure in your arteries as your heart beats. The second, or bottom number, is called the diastolic pressure. It is a measure of the pressure in your arteries as the heart relaxes. Blood pressure is classified into four stages. Based on your blood pressure reading, your health care provider may use the following stages to determine what type of treatment you need, if any. Systolic pressure and diastolic pressure are measured in a unit called mmHg. Normal Systolic pressure: below 120. Diastolic pressure: below 80. Elevated Systolic pressure: 120-129. Diastolic pressure: below 80. Hypertension stage 1 Systolic pressure: 130-139. Diastolic pressure: 80-89. Hypertension stage 2 Systolic pressure: 140 or above. Diastolic pressure: 90 or above. How can this condition affect me? Managing your hypertension is an important responsibility. Over time, hypertension can damage the arteries and decrease blood flow to important parts of the body, including the brain, heart, and kidneys. Having untreated or uncontrolled hypertension can lead to: A heart attack. A stroke. A weakened blood vessel (aneurysm). Heart failure. Kidney damage. Eye damage. Metabolic syndrome. Memory and  concentration problems. Vascular dementia. What actions can I take to manage this condition? Hypertension can be managed by making lifestyle changes and possibly by taking medicines. Your health care provider will help you make a plan to bring your blood pressure within a normal range. Nutrition  Eat a diet that is high in fiber and potassium, and low in salt (sodium), added sugar, and fat. An example eating plan is called the Dietary Approaches to Stop Hypertension (DASH) diet. To eat this way: Eat plenty of fresh fruits and vegetables. Try to fill one-half of your plate at each meal with fruits and vegetables. Eat whole grains, such as whole-wheat pasta, brown rice, or whole-grain bread. Fill about one-fourth of your plate with whole grains. Eat low-fat dairy products. Avoid fatty cuts of meat, processed or cured meats, and poultry with skin. Fill about one-fourth of your plate with lean proteins such as fish, chicken without skin, beans, eggs, and tofu. Avoid pre-made and processed foods. These tend to be higher in sodium, added sugar, and fat. Reduce your daily sodium intake. Most people with hypertension should eat less than 1,500 mg of sodium a day. Lifestyle  Work with your health care provider to maintain a healthy body weight or to lose weight. Ask what an ideal weight is for you. Get at least 30 minutes of exercise that causes your heart to beat faster (aerobic exercise) most days of the week. Activities may include walking, swimming, or biking. Include exercise to strengthen your muscles (resistance exercise), such as weight lifting, as part of your weekly exercise routine. Try to do these types of exercises for 30 minutes at least 3 days a week. Do not use any products that contain nicotine or tobacco, such as cigarettes, e-cigarettes,   and chewing tobacco. If you need help quitting, ask your health care provider. Control any long-term (chronic) conditions you have, such as high  cholesterol or diabetes. Identify your sources of stress and find ways to manage stress. This may include meditation, deep breathing, or making time for fun activities. Alcohol use Do not drink alcohol if: Your health care provider tells you not to drink. You are pregnant, may be pregnant, or are planning to become pregnant. If you drink alcohol: Limit how much you use to: 0-1 drink a day for women. 0-2 drinks a day for men. Be aware of how much alcohol is in your drink. In the [place] one drink equals one 12 oz bottle of beer (355 mL), one 5 oz glass of wine (148 mL), or one 1 oz glass of hard liquor (44 mL). Medicines Your health care provider may prescribe medicine if lifestyle changes are not enough to get your blood pressure under control and if: Your systolic blood pressure is 130 or higher. Your diastolic blood pressure is 80 or higher. Take medicines only as told by your health care provider. Follow the directions carefully. Blood pressure medicines must be taken as told by your health care provider. The medicine does not work as well when you skip doses. Skipping doses also puts you at risk for problems. Monitoring Before you monitor your blood pressure: Do not smoke, drink caffeinated beverages, or exercise within 30 minutes before taking a measurement. Use the bathroom and empty your bladder (urinate). Sit quietly for at least 5 minutes before taking measurements. Monitor your blood pressure at home as told by your health care provider. To do this: Sit with your back straight and supported. Place your feet flat on the floor. Do not cross your legs. Support your arm on a flat surface, such as a table. Make sure your upper arm is at heart level. Each time you measure, take two or three readings one minute apart and record the results. You may also need to have your blood pressure checked regularly by your health care provider. General information Talk with your health care  provider about your diet, exercise habits, and other lifestyle factors that may be contributing to hypertension. Review all the medicines you take with your health care provider because there may be side effects or interactions. Keep all visits as told by your health care provider. Your health care provider can help you create and adjust your plan for managing your high blood pressure. Where to find more information National Heart, Lung, and Blood Institute: www.nhlbi.nih.gov American Heart Association: www.heart.org Contact a health care provider if: You think you are having a reaction to medicines you have taken. You have repeated (recurrent) headaches. You feel dizzy. You have swelling in your ankles. You have trouble with your vision. Get help right away if: You develop a severe headache or confusion. You have unusual weakness or numbness, or you feel faint. You have severe pain in your chest or abdomen. You vomit repeatedly. You have trouble breathing. These symptoms may represent a serious problem that is an emergency. Do not wait to see if the symptoms will go away. Get medical help right away. Call your local emergency services (911 in the [place] Do not drive yourself to the hospital. Summary Hypertension is when the force of blood pumping through your arteries is too strong. If this condition is not controlled, it may put you at risk for serious complications. Your personal target blood pressure may vary depending on   your medical conditions, your age, and other factors. For most people, a normal blood pressure is less than 120/80. Hypertension is managed by lifestyle changes, medicines, or both. Lifestyle changes to help manage hypertension include losing weight, eating a healthy, low-sodium diet, exercising more, stopping smoking, and limiting alcohol. This information is not intended to replace advice given to you by your health care provider. Make sure you discuss any questions  you have with your health care provider. Document Revised: 12/20/2019 Document Reviewed: 11/02/2019 Elsevier Patient Education  2022 [place], Adult After being diagnosed with anxiety, you may be relieved to know why you have felt or behaved a certain way. You may also feel overwhelmed about the treatment ahead and what it will mean for your life. With care and support, you can manage this condition. How to manage lifestyle changes Managing stress and anxiety Stress is your body's reaction to life changes and events, both good and bad. Most stress will last just a few hours, but stress can be ongoing and can lead to more than just stress. Although stress can play a major role in anxiety, it is not the same as anxiety. Stress is usually caused by something external, such as a deadline, test, or competition. Stress normally passes after the triggering event has ended.  Anxiety is caused by something internal, such as imagining a terrible outcome or worrying that something will go wrong that will devastate you. Anxiety often does not go away even after the triggering event is over, and it can become long-term (chronic) worry. It is important to understand the differences between stress and anxiety and to manage your stress effectively so that it does not lead to an anxious response. Talk with your health care provider or a counselor to learn more about reducing anxiety and stress. He or she may suggest tension reduction techniques, such as: Music therapy. Spend time creating or listening to music that you enjoy and that inspires you. Mindfulness-based meditation. Practice being aware of your normal breaths while not trying to control your breathing. It can be done while sitting or walking. Centering prayer. This involves focusing on a word, phrase, or sacred image that means something to you and brings you peace. Deep breathing. To do this, expand your stomach and inhale slowly  through your nose. Hold your breath for 3-5 seconds. Then exhale slowly, letting your stomach muscles relax. Self-talk. Learn to notice and identify thought patterns that lead to anxiety reactions and change those patterns to thoughts that feel peaceful. Muscle relaxation. Taking time to tense muscles and then relax them. Choose a tension reduction technique that fits your lifestyle and personality. These techniques take time and practice. Set aside 5-15 minutes a day to do them. Therapists can offer counseling and training in these techniques. The training to help with anxiety may be covered by some insurance plans. Other things you can do to manage stress and anxiety include: Keeping a stress diary. This can help you learn what triggers your reaction and then learn ways to manage your response. Thinking about how you react to certain situations. You may not be able to control everything, but you can control your response. Making time for activities that help you relax and not feeling guilty about spending your time in this way. Doing visual imagery. This involves imagining or creating mental pictures to help you relax. Practicing yoga. Through yoga poses, you can lower tension and promote relaxation.  Medicines Medicines can help ease symptoms. Medicines  for anxiety include: Antidepressant medicines. These are usually prescribed for long-term daily control. Anti-anxiety medicines. These may be added in severe cases, especially when panic attacks occur. Medicines will be prescribed by a health care provider. When used together, medicines, psychotherapy, and tension reduction techniques may be the most effective treatment. Relationships Relationships can play a big part in helping you recover. Try to spend more time connecting with trusted friends and family members. Consider going to couples counseling if you have a partner, taking family education classes, or going to family therapy. Therapy can  help you and others better understand your condition. How to recognize changes in your anxiety Everyone responds differently to treatment for anxiety. Recovery from anxiety happens when symptoms decrease and stop interfering with your daily activities at home or work. This may mean that you will start to: Have better concentration and focus. Worry will interfere less in your daily thinking. Sleep better. Be less irritable. Have more energy. Have improved memory. It is also important to recognize when your condition is getting worse. Contact your health care provider if your symptoms interfere with home or work and you feel like your condition is not improving. Follow these instructions at home: Activity Exercise. Adults should do the following: Exercise for at least 150 minutes each week. The exercise should increase your heart rate and make you sweat (moderate-intensity exercise). Strengthening exercises at least twice a week. Get the right amount and quality of sleep. Most adults need 7-9 hours of sleep each night. Lifestyle  Eat a healthy diet that includes plenty of vegetables, fruits, whole grains, low-fat dairy products, and lean protein. Do not eat a lot of foods that are high in fats, added sugars, or salt (sodium). Make choices that simplify your life. Do not use any products that contain nicotine or tobacco. These products include cigarettes, chewing tobacco, and vaping devices, such as e-cigarettes. If you need help quitting, ask your health care provider. Avoid caffeine, alcohol, and certain over-the-counter cold medicines. These may make you feel worse. Ask your pharmacist which medicines to avoid. General instructions Take over-the-counter and prescription medicines only as told by your health care provider. Keep all follow-up visits. This is important. Where to find support You can get help and support from these sources: Self-help groups. Online and [employer]. A trusted spiritual leader. Couples counseling. Family education classes. Family therapy. Where to find more information You may find that joining a support group helps you deal with your anxiety. The following sources can help you locate counselors or support groups near you: [place]: www.mentalhealthamerica.net Anxiety and Depression Association of [place] (ADAA): https://www.clark.net/ National Alliance on Mental Illness (NAMI): www.nami.org Contact a health care provider if: You have a hard time staying focused or finishing daily tasks. You spend many hours a day feeling worried about everyday life. You become exhausted by worry. You start to have headaches or frequently feel tense. You develop chronic nausea or diarrhea. Get help right away if: You have a racing heart and shortness of breath. You have thoughts of hurting yourself or others. If you ever feel like you may hurt yourself or others, or have thoughts about taking your own life, get help right away. Go to your nearest emergency department or: Call your local emergency services (911 in the [place] Call a suicide crisis helpline, such as the [facility] at (212)267-6048 or 988 in the [place]. This is open 24 hours a day in the [place] Text the Crisis  Text Line at (272)252-9458 (in the [place] Summary Taking steps to learn and use tension reduction techniques can help calm you and help prevent triggering an anxiety reaction. When used together, medicines, psychotherapy, and tension reduction techniques may be the most effective treatment. Family, friends, and partners can play a big part in supporting you. This information is not intended to replace advice given to you by your health care provider. Make sure you discuss any questions you have with your health care provider. Document Revised: 06/27/2021 Document Reviewed: 03/25/2021 Elsevier Patient Education  [place].

## 2021-11-02 NOTE — Progress Notes (Signed)
[place], [place]  [address] Phone:  [phone]   Fax:  [phone]   Established Patient Office Visit  Subjective:  Patient ID: Abigail Medina, female    DOB: 01-Apr-1966  Age: 55 y.o. MRN: [id number]  CC:  Chief Complaint  Patient presents with   Follow-up    3 month follow up, Patient stated she is experiencing more numbness in left hand and face. Hydrochlorothiazide d/c at discharge 9/24 stated she started taking it again about 1 month ago because BP was elevated.    HPI Abigail Medina presents for follow up. She  has a past medical history of HIV (human immunodeficiency virus infection) ([place]), Hypertension, Seizures ([place]), Stroke ([place]), and Vision abnormalities.   She reports that the numbness in her face and hand has started to bother her. She was off the HCTZ per the discharge instructions.  She did start back on the hydrochlorothiazide.  She reports that she has missed her medication today.   She has occasional headache and neck pressure.  She has been losing her balance. She has noticed that "her ears are soupy". She feels like they need to pop. She denies any falls or injuries. She denies any feeling of spinning. She does not feel like it is getting worse. She has some good days. Denies visual changes, shortness of breath, dyspnea on exertion, chest pain, nausea, vomiting or any edema.   She has a lot of family support. She continues to get things together from her husband recent death.   Past Medical History:  Diagnosis Date   HIV (human immunodeficiency virus infection) ([place])    Hypertension    Seizures ([place])    Stroke ([place])    Vision abnormalities     Past Surgical History:  Procedure Laterality Date   LOOP RECORDER INSERTION N/A 03/29/2019   Procedure: LOOP RECORDER INSERTION;  Surgeon: Constance Haw, MD;  Location: [facility];  Service: Cardiovascular;  Laterality: N/A;    Family History   Problem Relation Age of Onset   Hypertension Mother     Social History   Socioeconomic History   Marital status: Single    Spouse name: Not on file   Number of children: Not on file   Years of education: Not on file   Highest education level: Not on file  Occupational History   Not on file  Tobacco Use   Smoking status: Every Day    Packs/day: 0.50    Types: Cigarettes    Start date: 10/17/2015    Last attempt to quit: 08/10/2019    Years since quitting: 2.2   Smokeless tobacco: Never   Tobacco comments:    picked up smoking after her husband died 03/14/21  Vaping Use   Vaping Use: Never used  Substance and Sexual Activity   Alcohol use: Yes    Alcohol/week: 60.0 standard drinks    Types: 60 Standard drinks or equivalent per week    Comment: "stayed drunk" after her husband passed unexpectedly 03/15/2023, is slowing down   Drug use: No   Sexual activity: Not Currently    Partners: Male    Birth control/protection: Condom    Comment: offered condoms  Other Topics Concern   Not on file  Social History Narrative   Not on file   Social Determinants of Health   Financial Resource Strain: Not on file  Food Insecurity: Not on file  Transportation Needs: Not on  file  Physical Activity: Not on file  Stress: Not on file  Social Connections: Not on file  Intimate Partner Violence: Not on file    Outpatient Medications Prior to Visit  Medication Sig Dispense Refill   albuterol (PROVENTIL HFA;VENTOLIN HFA) 108 (90 Base) MCG/ACT inhaler Inhale 2 puffs into the lungs every 4 (four) hours as needed for wheezing or shortness of breath (cough, shortness of breath or wheezing.). 1 Inhaler 6   amLODipine (NORVASC) 10 MG tablet TAKE 1 TABLET BY MOUTH EVERY DAY (Patient taking differently: Take 10 mg by mouth daily.) 90 tablet 3   atorvastatin (LIPITOR) 40 MG tablet TAKE 1 TABLET BY MOUTH DAILY AT 6 PM. (Patient taking differently: Take 40 mg by mouth daily.) 90 tablet 1   cetirizine  (ZYRTEC) 10 MG tablet TAKE 1 TABLET BY MOUTH EVERY DAY (Patient taking differently: Take 10 mg by mouth daily as needed for allergies or rhinitis.) 90 tablet 3   clopidogrel (PLAVIX) 75 MG tablet TAKE 1 TABLET BY MOUTH EVERY DAY 90 tablet 3   dolutegravir (TIVICAY) 50 MG tablet TAKE 1 TABLET BY MOUTH EVERY DAY (Patient taking differently: Take 50 mg by mouth daily.) 30 tablet 1   emtricitabine-tenofovir AF (DESCOVY) 200-25 MG tablet Take 1 tablet by mouth daily. 30 tablet 1   fluticasone (FLONASE) 50 MCG/ACT nasal spray SPRAY 2 SPRAYS INTO EACH NOSTRIL EVERY DAY 16 mL 1   PARoxetine (PAXIL) 10 MG tablet TAKE 1 TABLET BY MOUTH EVERY DAY 90 tablet 4   traZODone (DESYREL) 50 MG tablet TAKE 1/2 TABLETS BY MOUTH AT BEDTIME AS NEEDED FOR SLEEP. 30 tablet 5   triamcinolone ointment (KENALOG) 0.5 % Apply 1 application topically 2 (two) times daily. 30 g 2   Vitamin D, Ergocalciferol, (DRISDOL) 1.25 MG (50000 UNIT) CAPS capsule TAKE 1 CAPSULE BY MOUTH ONE TIME PER WEEK (Patient taking differently: Take 50,000 Units by mouth every 7 (seven) days. Mondays) 8 capsule 1   cimetidine (TAGAMET) 200 MG tablet Take 1 tablet (200 mg total) by mouth 2 (two) times daily. (Patient taking differently: Take 200 mg by mouth 2 (two) times daily as needed (reflux).) 60 tablet 2   No facility-administered medications prior to visit.    Allergies  Allergen Reactions   Lisinopril Swelling    ROS Review of Systems    Objective:    Physical Exam HENT:     Head: Normocephalic.     Nose: Nose normal.     Mouth/Throat:     Mouth: Mucous membranes are moist.  Cardiovascular:     Rate and Rhythm: Normal rate and regular rhythm.     Pulses: Normal pulses.     Heart sounds: Normal heart sounds.  Abdominal:     General: Bowel sounds are normal.     Palpations: Abdomen is soft.  Musculoskeletal:     Cervical back: Normal range of motion.  Skin:    General: Skin is warm and dry.     Capillary Refill: Capillary  refill takes less than 2 seconds.  Neurological:     General: No focal deficit present.     Mental Status: She is alert and oriented to person, place, and time.  Psychiatric:        Mood and Affect: Mood normal.        Behavior: Behavior normal.        Thought Content: Thought content normal.    BP (!) 128/97 (BP Location: Right Arm, Patient Position: Sitting)   Temp Marland Kitchen)  97.2 F (36.2 C)   Ht 5\' 3"  (1.6 m)   Wt 150 lb 0.6 oz (68.1 kg)   LMP 08/04/2014   SpO2 100%   BMI 26.58 kg/m  Wt Readings from Last 3 Encounters:  11/02/21 150 lb 0.6 oz (68.1 kg)  09/20/21 150 lb 0.4 oz (68.1 kg)  08/02/21 144 lb 0.6 oz (65.3 kg)     Health Maintenance Due  Topic Date Due   COVID-19 Vaccine (1) Never done   Zoster Vaccines- Shingrix (1 of 2) Never done   MAMMOGRAM  06/09/2021   INFLUENZA VACCINE  07/16/2021    There are no preventive care reminders to display for this patient.  Lab Results  Component Value Date   TSH 3.152 03/28/2019   Lab Results  Component Value Date   WBC 3.2 (L) 09/08/2021   HGB 14.5 09/08/2021   HCT 41.7 09/08/2021   MCV 91.9 09/08/2021   PLT 154 09/08/2021   Lab Results  Component Value Date   NA 137 09/08/2021   K 3.5 09/08/2021   CO2 26 09/08/2021   GLUCOSE 96 09/08/2021   BUN 10 09/08/2021   CREATININE 0.86 09/08/2021   BILITOT 1.5 (H) 09/08/2021   ALKPHOS 62 09/08/2021   AST 27 09/08/2021   ALT 37 09/08/2021   PROT 6.7 09/08/2021   ALBUMIN 3.3 (L) 09/08/2021   CALCIUM 8.9 09/08/2021   ANIONGAP 7 09/08/2021   Lab Results  Component Value Date   CHOL 148 09/08/2021   Lab Results  Component Value Date   HDL 65 09/08/2021   Lab Results  Component Value Date   LDLCALC 71 09/08/2021   Lab Results  Component Value Date   TRIG 59 09/08/2021   Lab Results  Component Value Date   CHOLHDL 2.3 09/08/2021   Lab Results  Component Value Date   HGBA1C 5.4 09/07/2021      Assessment & Plan:   Problem List Items Addressed This  Visit       Cardiovascular and Mediastinum   Essential hypertension - Primary Elevated today however patient has not taken her medication as directed.  Encouraged her to take her amlodipine 10 mg along with hydrochlorothiazide 25 mg daily     Relevant Medications   hydrochlorothiazide (HYDRODIURIL) 25 MG tablet   Other Relevant Orders   POCT URINALYSIS DIP (CLINITEK) (Completed)    Meds ordered this encounter  Medications   hydrochlorothiazide (HYDRODIURIL) 25 MG tablet    Sig: Take 1 tablet (25 mg total) by mouth daily.    Dispense:  90 tablet    Refill:  3    Order Specific Question:   Supervising Provider    Answer:   Tresa Garter [id number]     Follow-up: Return in about 3 months (around 02/02/2022) for Follow up HTN [id number].    [name], NP

## 2021-11-05 ENCOUNTER — Encounter: Payer: Self-pay | Admitting: Nurse Practitioner

## 2021-11-06 NOTE — Progress Notes (Signed)
Carelink Summary Report / Loop Recorder 

## 2021-11-18 ENCOUNTER — Other Ambulatory Visit: Payer: Self-pay | Admitting: Nurse Practitioner

## 2021-11-18 DIAGNOSIS — E559 Vitamin D deficiency, unspecified: Secondary | ICD-10-CM

## 2021-11-20 ENCOUNTER — Ambulatory Visit: Payer: Medicare Other | Admitting: Internal Medicine

## 2021-11-26 ENCOUNTER — Other Ambulatory Visit (HOSPITAL_COMMUNITY): Payer: Self-pay

## 2021-11-26 ENCOUNTER — Other Ambulatory Visit: Payer: Self-pay

## 2021-11-26 ENCOUNTER — Ambulatory Visit (INDEPENDENT_AMBULATORY_CARE_PROVIDER_SITE_OTHER): Payer: Medicare Other | Admitting: Internal Medicine

## 2021-11-26 ENCOUNTER — Telehealth: Payer: Self-pay

## 2021-11-26 ENCOUNTER — Encounter: Payer: Self-pay | Admitting: Internal Medicine

## 2021-11-26 VITALS — BP 129/91 | HR 85 | Temp 97.5°F | Ht 63.0 in | Wt 150.0 lb

## 2021-11-26 DIAGNOSIS — Z79899 Other long term (current) drug therapy: Secondary | ICD-10-CM

## 2021-11-26 DIAGNOSIS — B20 Human immunodeficiency virus [HIV] disease: Secondary | ICD-10-CM

## 2021-11-26 DIAGNOSIS — Z23 Encounter for immunization: Secondary | ICD-10-CM

## 2021-11-26 DIAGNOSIS — Z0000 Encounter for general adult medical examination without abnormal findings: Secondary | ICD-10-CM

## 2021-11-26 MED ORDER — DESCOVY 200-25 MG PO TABS
1.0000 | ORAL_TABLET | Freq: Every day | ORAL | 11 refills | Status: DC
  Filled 2021-11-26 – 2021-12-25 (×3): qty 30, 30d supply, fill #0
  Filled 2022-01-15: qty 30, 30d supply, fill #1
  Filled 2022-02-06: qty 30, 30d supply, fill #2
  Filled 2022-03-20: qty 30, 30d supply, fill #3
  Filled 2022-04-11: qty 30, 30d supply, fill #4
  Filled 2022-05-23: qty 30, 30d supply, fill #5
  Filled 2022-06-13: qty 30, 30d supply, fill #6
  Filled 2022-07-18: qty 30, 30d supply, fill #7
  Filled 2022-08-26: qty 30, 30d supply, fill #8
  Filled 2022-09-24: qty 30, 30d supply, fill #9
  Filled 2022-10-24: qty 30, 30d supply, fill #10

## 2021-11-26 MED ORDER — TIVICAY 50 MG PO TABS
50.0000 mg | ORAL_TABLET | Freq: Every day | ORAL | 11 refills | Status: DC
  Filled 2021-11-26 – 2021-12-25 (×3): qty 30, 30d supply, fill #0
  Filled 2022-01-15: qty 30, 30d supply, fill #1
  Filled 2022-02-06: qty 30, 30d supply, fill #2
  Filled 2022-03-20: qty 30, 30d supply, fill #3
  Filled 2022-04-11: qty 30, 30d supply, fill #4
  Filled 2022-05-23: qty 30, 30d supply, fill #5
  Filled 2022-06-13: qty 30, 30d supply, fill #6
  Filled 2022-07-18: qty 30, 30d supply, fill #7
  Filled 2022-08-26: qty 30, 30d supply, fill #8
  Filled 2022-09-24: qty 30, 30d supply, fill #9
  Filled 2022-10-24: qty 30, 30d supply, fill #10

## 2021-11-26 NOTE — Progress Notes (Signed)
RFV: follow up for hiv disease  Patient ID: Abigail Medina, female   DOB: 1966-10-08, 55 y.o.   MRN: [id number]  HPI 55yo F with well controlled hiv disease. Well controlled on tivicay-descovy. -CD 4 count of 456/VL<20, on may 2022. Doing well with adherence, but may have missed 2 days in a row.   [facility].  Was feeling poorly last week so that is why she rescheduled today. Lasted 24hrs. With some mild respiratory symptoms.   Outpatient Encounter Medications as of 11/26/2021  Medication Sig   albuterol (PROVENTIL HFA;VENTOLIN HFA) 108 (90 Base) MCG/ACT inhaler Inhale 2 puffs into the lungs every 4 (four) hours as needed for wheezing or shortness of breath (cough, shortness of breath or wheezing.).   amLODipine (NORVASC) 10 MG tablet TAKE 1 TABLET BY MOUTH EVERY DAY (Patient taking differently: Take 10 mg by mouth daily.)   atorvastatin (LIPITOR) 40 MG tablet TAKE 1 TABLET BY MOUTH DAILY AT 6 PM. (Patient taking differently: Take 40 mg by mouth daily.)   cetirizine (ZYRTEC) 10 MG tablet TAKE 1 TABLET BY MOUTH EVERY DAY (Patient taking differently: Take 10 mg by mouth daily as needed for allergies or rhinitis.)   cimetidine (TAGAMET) 200 MG tablet Take 1 tablet (200 mg total) by mouth 2 (two) times daily. (Patient taking differently: Take 200 mg by mouth 2 (two) times daily as needed (reflux).)   clopidogrel (PLAVIX) 75 MG tablet TAKE 1 TABLET BY MOUTH EVERY DAY   dolutegravir (TIVICAY) 50 MG tablet TAKE 1 TABLET BY MOUTH EVERY DAY (Patient taking differently: Take 50 mg by mouth daily.)   emtricitabine-tenofovir AF (DESCOVY) 200-25 MG tablet Take 1 tablet by mouth daily.   fluticasone (FLONASE) 50 MCG/ACT nasal spray SPRAY 2 SPRAYS INTO EACH NOSTRIL EVERY DAY   hydrochlorothiazide (HYDRODIURIL) 25 MG tablet Take 1 tablet (25 mg total) by mouth daily.   PARoxetine (PAXIL) 10 MG tablet TAKE 1 TABLET BY MOUTH EVERY DAY   traZODone (DESYREL) 50 MG tablet TAKE 1/2  TABLETS BY MOUTH AT BEDTIME AS NEEDED FOR SLEEP.   triamcinolone ointment (KENALOG) 0.5 % Apply 1 application topically 2 (two) times daily.   Vitamin D, Ergocalciferol, (DRISDOL) 1.25 MG (50000 UNIT) CAPS capsule TAKE 1 CAPSULE BY MOUTH ONE TIME PER WEEK   No facility-administered encounter medications on file as of 11/26/2021.     Patient Active Problem List   Diagnosis Date Noted   Left-sided weakness 09/07/2021   Paresthesia 09/06/2021   Healthcare maintenance 04/13/2019   Aneurysm of thoracic aorta 03/29/2019   Cerebral thrombosis with cerebral infarction 03/28/2019   CVA (cerebral vascular accident) ([place]) 03/28/2019   Acute focal neurological deficit 03/27/2019   Hypokalemia 03/27/2019   Hyperbilirubinemia 03/27/2019   History of CVA with residual deficit    Pain of back and left lower extremity 12/15/2018   Upper respiratory infection, viral 12/15/2018   Ulnar neuritis 06/22/2015   Tobacco dependence 06/02/2015   Numbness and tingling of right arm 05/30/2015   Right arm weakness 05/30/2015   Arm numbness left 10/10/2014   Vitamin D deficiency 08/05/2014   Allergic rhinitis 05/25/2013   Nasal septal perforation 05/25/2013   Polypharmacy 08/20/2012   HIV (human immunodeficiency virus infection) ([place]) 08/20/2012   HLD (hyperlipidemia) 07/15/2012   BP (high blood pressure) 07/15/2012   Cerebrovascular accident, late effects 01/30/2012   Depression with anxiety 07/15/2011   Seborrhea capitis 04/18/2011   Seborrheic dermatitis 03/07/2011   Hemiplegia, late effect of cerebrovascular  disease ([place]) 01/25/2011   VAGINAL DISCHARGE 12/26/2010   NEVUS 08/29/2010   AMENORRHEA 07/19/2010   SKIN RASH 05/21/2010   THRUSH 12/28/2009   Human immunodeficiency virus (HIV) disease ([place]) 08/06/2007   Essential hypertension 08/06/2007     Health Maintenance Due  Topic Date Due   COVID-19 Vaccine (1) Never done   Zoster Vaccines- Shingrix (1 of 2) Never done   MAMMOGRAM  06/09/2021    INFLUENZA VACCINE  07/16/2021     Review of Systems Left eye irritation for contact this morning ; otherwise 12 point ros  Soc hx: at most smoking a cigarette a day.Marland Kitchen Physical Exam   BP (!) 129/91   Pulse 85   Temp (!) 97.5 F (36.4 C) (Oral)   Ht 5\' 3"  (1.6 m)   Wt 150 lb (68 kg)   LMP 08/04/2014   SpO2 98%   BMI 26.57 kg/m   Physical Exam  Constitutional:  oriented to person, place, and time. appears well-developed and well-nourished. No distress.  HENT: [place]/AT, PERRLA, no scleral icterus Mouth/Throat: Oropharynx is clear and moist. No oropharyngeal exudate.  Cardiovascular: Normal rate, regular rhythm and normal heart sounds. Exam reveals no gallop and no friction rub.  No murmur heard.  Pulmonary/Chest: Effort normal and breath sounds normal. No respiratory distress.  has no wheezes.  Neck = supple, no nuchal rigidity Abdominal: Soft. Bowel sounds are normal.  exhibits no distension. There is no tenderness.  Lymphadenopathy: no cervical adenopathy. No axillary adenopathy Neurological: alert and oriented to person, place, and time.  Skin: Skin is warm and dry. No rash noted. No erythema.  Psychiatric: a normal mood and affect.  behavior is normal.   Lab Results  Component Value Date   CD4TCELL 40 04/17/2021   Lab Results  Component Value Date   CD4TABS 456 04/17/2021   CD4TABS 453 10/18/2020   CD4TABS 573 02/02/2020   Lab Results  Component Value Date   HIV1RNAQUANT Not Detected 04/17/2021   Lab Results  Component Value Date   HEPBSAB NEG 03/10/2014   Lab Results  Component Value Date   LABRPR NON-REACTIVE 04/17/2021    CBC Lab Results  Component Value Date   WBC 3.2 (L) 09/08/2021   RBC 4.54 09/08/2021   HGB 14.5 09/08/2021   HCT 41.7 09/08/2021   PLT 154 09/08/2021   MCV 91.9 09/08/2021   MCH 31.9 09/08/2021   MCHC 34.8 09/08/2021   RDW 13.1 09/08/2021   LYMPHSABS 1.7 09/08/2021   MONOABS 0.4 09/08/2021   EOSABS 0.1 09/08/2021     BMET Lab Results  Component Value Date   NA 137 09/08/2021   K 3.5 09/08/2021   CL 104 09/08/2021   CO2 26 09/08/2021   GLUCOSE 96 09/08/2021   BUN 10 09/08/2021   CREATININE 0.86 09/08/2021   CALCIUM 8.9 09/08/2021   GFRNONAA >60 09/08/2021   GFRAA 94 10/18/2020      Assessment and Plan  Hiv disease = see if [facility] can mail drugs. Will get cd 4 count and hiv vl  Long term medication management = cr is stable on current regimen  Health maintenance - will give flu shot today. Deferred covid.  - Referral for colonoscopy  Smoking cessation = so close to being Nearly done --only smoking 1 cig per day. Continue to support her and quit date.

## 2021-11-26 NOTE — Telephone Encounter (Signed)
Canceled [facility]icay and [facility] with [facility]. Provider sent medications to [facility].   Beryle Flock, RN

## 2021-11-27 ENCOUNTER — Other Ambulatory Visit (HOSPITAL_COMMUNITY): Payer: Self-pay

## 2021-11-27 LAB — T-HELPER CELL (CD4) - (RCID CLINIC ONLY)
CD4 % Helper T Cell: 45 % (ref 33–65)
CD4 T Cell Abs: 536 /uL (ref 400–1790)

## 2021-11-28 LAB — HIV-1 RNA QUANT-NO REFLEX-BLD
HIV 1 RNA Quant: NOT DETECTED Copies/mL
HIV-1 RNA Quant, Log: NOT DETECTED Log cps/mL

## 2021-12-24 ENCOUNTER — Other Ambulatory Visit: Payer: Self-pay | Admitting: Internal Medicine

## 2021-12-24 DIAGNOSIS — B20 Human immunodeficiency virus [HIV] disease: Secondary | ICD-10-CM

## 2021-12-25 ENCOUNTER — Other Ambulatory Visit (HOSPITAL_COMMUNITY): Payer: Self-pay

## 2021-12-25 NOTE — Telephone Encounter (Signed)
Please advise on refill for Flonase.  When should pt schedule follow up appt?

## 2022-01-07 ENCOUNTER — Ambulatory Visit (INDEPENDENT_AMBULATORY_CARE_PROVIDER_SITE_OTHER): Payer: Medicare Other

## 2022-01-07 DIAGNOSIS — I63132 Cerebral infarction due to embolism of left carotid artery: Secondary | ICD-10-CM | POA: Diagnosis not present

## 2022-01-08 LAB — CUP PACEART REMOTE DEVICE CHECK
Date Time Interrogation Session: 20230122234758
Implantable Pulse Generator Implant Date: 20200413

## 2022-01-15 ENCOUNTER — Other Ambulatory Visit (HOSPITAL_COMMUNITY): Payer: Self-pay

## 2022-01-16 ENCOUNTER — Other Ambulatory Visit (HOSPITAL_COMMUNITY): Payer: Self-pay

## 2022-01-17 ENCOUNTER — Other Ambulatory Visit (HOSPITAL_COMMUNITY): Payer: Self-pay

## 2022-01-18 NOTE — Progress Notes (Signed)
Carelink Summary Report / Loop Recorder 

## 2022-01-24 ENCOUNTER — Other Ambulatory Visit: Payer: Self-pay | Admitting: Nurse Practitioner

## 2022-01-24 DIAGNOSIS — Z1211 Encounter for screening for malignant neoplasm of colon: Secondary | ICD-10-CM

## 2022-01-24 NOTE — Progress Notes (Signed)
   [facility] [address] [place], [place]  [address] Phone:  [phone]   Fax:  [phone] 

## 2022-01-25 ENCOUNTER — Ambulatory Visit
Admission: RE | Admit: 2022-01-25 | Discharge: 2022-01-25 | Disposition: A | Discharge status: Home / Self Care | Payer: Medicare Other | Source: Ambulatory Visit | Attending: Nurse Practitioner | Admitting: Nurse Practitioner

## 2022-01-25 DIAGNOSIS — Z1231 Encounter for screening mammogram for malignant neoplasm of breast: Secondary | ICD-10-CM | POA: Diagnosis not present

## 2022-01-29 ENCOUNTER — Other Ambulatory Visit: Payer: Self-pay | Admitting: Nurse Practitioner

## 2022-01-29 DIAGNOSIS — R928 Other abnormal and inconclusive findings on diagnostic imaging of breast: Secondary | ICD-10-CM

## 2022-02-04 ENCOUNTER — Other Ambulatory Visit: Payer: Self-pay

## 2022-02-04 ENCOUNTER — Encounter: Payer: Self-pay | Admitting: Nurse Practitioner

## 2022-02-04 ENCOUNTER — Ambulatory Visit (INDEPENDENT_AMBULATORY_CARE_PROVIDER_SITE_OTHER): Payer: Medicare Other | Admitting: Nurse Practitioner

## 2022-02-04 VITALS — BP 129/98 | HR 90 | Temp 97.4°F | Ht 63.0 in | Wt 142.8 lb

## 2022-02-04 DIAGNOSIS — F17200 Nicotine dependence, unspecified, uncomplicated: Secondary | ICD-10-CM | POA: Diagnosis not present

## 2022-02-04 DIAGNOSIS — B20 Human immunodeficiency virus [HIV] disease: Secondary | ICD-10-CM

## 2022-02-04 DIAGNOSIS — G4700 Insomnia, unspecified: Secondary | ICD-10-CM | POA: Diagnosis not present

## 2022-02-04 DIAGNOSIS — K219 Gastro-esophageal reflux disease without esophagitis: Secondary | ICD-10-CM

## 2022-02-04 DIAGNOSIS — E559 Vitamin D deficiency, unspecified: Secondary | ICD-10-CM | POA: Diagnosis not present

## 2022-02-04 DIAGNOSIS — F10220 Alcohol dependence with intoxication, uncomplicated: Secondary | ICD-10-CM | POA: Diagnosis not present

## 2022-02-04 DIAGNOSIS — I69959 Hemiplegia and hemiparesis following unspecified cerebrovascular disease affecting unspecified side: Secondary | ICD-10-CM

## 2022-02-04 DIAGNOSIS — I10 Essential (primary) hypertension: Secondary | ICD-10-CM

## 2022-02-04 MED ORDER — CIMETIDINE 200 MG PO TABS: 200.0000 mg | ORAL_TABLET | Freq: Two times a day (BID) | ORAL | 2 refills | Status: AC

## 2022-02-04 MED ORDER — AMLODIPINE BESYLATE 10 MG PO TABS: 10.0000 mg | ORAL_TABLET | Freq: Every day | ORAL | 0 refills | Status: DC

## 2022-02-04 MED ORDER — VITAMIN D (ERGOCALCIFEROL) 1.25 MG (50000 UNIT) PO CAPS: 50000.0000 [IU] | ORAL_CAPSULE | ORAL | 0 refills | Status: DC

## 2022-02-04 MED ORDER — PAROXETINE HCL 10 MG PO TABS: 10.0000 mg | ORAL_TABLET | Freq: Every day | ORAL | 0 refills | Status: DC

## 2022-02-04 MED ORDER — ATORVASTATIN CALCIUM 40 MG PO TABS: 40.0000 mg | ORAL_TABLET | Freq: Every day | ORAL | 0 refills | Status: DC

## 2022-02-04 MED ORDER — TRAZODONE HCL 50 MG PO TABS: 50.0000 mg | ORAL_TABLET | Freq: Every day | ORAL | 0 refills | Status: DC

## 2022-02-04 MED ORDER — HYDROCHLOROTHIAZIDE 25 MG PO TABS: 25.0000 mg | ORAL_TABLET | Freq: Every day | ORAL | 0 refills | Status: DC

## 2022-02-04 MED ORDER — CLOPIDOGREL BISULFATE 75 MG PO TABS: 75.0000 mg | ORAL_TABLET | Freq: Every day | ORAL | 0 refills | Status: DC

## 2022-02-04 NOTE — Progress Notes (Signed)
[place], [place]  [address] Phone:  [phone]   Fax:  [phone]   Established Patient Office Visit  Subjective:  Patient ID: Abigail Medina, female    DOB: Sep 27, 1966  Age: 56 y.o. MRN: [id number]  CC:  Chief Complaint  Patient presents with   Follow-up    Pt is here today for her 3 month follow up visit with no concerns or issues to discuss.    HPI Abigail Medina presents for follow up. She  has a past medical history of HIV (human immunodeficiency virus infection) ([place]), Hypertension, Seizures ([place]), Stroke ([place]), and Vision abnormalities.   Abigail Medina is in today for follow up for Hypertension. The current prescribed treatment is amlodipine 10 mg.Compliance is reported and home blood pressure monitoring is done with a range of normal "fine". BP with recent home visit was normal.The  DASH diet is being followed. An exercise regimen is not  ongoing. She is active in her home. Denies headache, dizziness, visual changes, shortness of breath, dyspnea on exertion, chest pain, nausea, vomiting or any edema.   She continue to have heartburn with gas. She uses tagamet regularly which is effective.   She feels like her mood is stable. She is using the Paxil 10 mg and Trazodone 50 mg as directed. She has recently been able to donate her husbands clothes.   She is scheduled her repeat mammogram and [place]. She reports that she has been anxious today because she did not remember having this appointment. She wanted to make she that everything is ok.   She has not heard from the GI office. Referral note indicates a MyChart message was sent. Abigail Medina doe snot use MyChart.  Past Medical History:  Diagnosis Date   HIV (human immunodeficiency virus infection) ([place])    Hypertension    Seizures ([place])    Stroke ([place])    Vision abnormalities     Past Surgical History:  Procedure Laterality Date   LOOP RECORDER INSERTION N/A 03/29/2019    Procedure: LOOP RECORDER INSERTION;  Surgeon: Constance Haw, MD;  Location: [facility];  Service: Cardiovascular;  Laterality: N/A;    Family History  Problem Relation Age of Onset   Hypertension Mother     Social History   Socioeconomic History   Marital status: Single    Spouse name: Not on file   Number of children: Not on file   Years of education: Not on file   Highest education level: Not on file  Occupational History   Not on file  Tobacco Use   Smoking status: Every Day    Packs/day: 0.50    Types: Cigarettes    Start date: 10/17/2015    Last attempt to quit: 08/10/2019    Years since quitting: 2.4   Smokeless tobacco: Never   Tobacco comments:    picked up smoking after her husband died 04-02-2021         1 to 3 cigs per day  Vaping Use   Vaping Use: Never used  Substance and Sexual Activity   Alcohol use: Yes    Alcohol/week: 60.0 standard drinks    Types: 60 Standard drinks or equivalent per week    Comment: occ   Drug use: No   Sexual activity: Not Currently    Partners: Male    Birth control/protection: Condom    Comment: offered condoms  Other Topics Concern   Not on  file  Social History Narrative   Not on file   Social Determinants of Health   Financial Resource Strain: Not on file  Food Insecurity: Not on file  Transportation Needs: Not on file  Physical Activity: Not on file  Stress: Not on file  Social Connections: Not on file  Intimate Partner Violence: Not on file    Outpatient Medications Prior to Visit  Medication Sig Dispense Refill   albuterol (PROVENTIL HFA;VENTOLIN HFA) 108 (90 Base) MCG/ACT inhaler Inhale 2 puffs into the lungs every 4 (four) hours as needed for wheezing or shortness of breath (cough, shortness of breath or wheezing.). 1 Inhaler 6   cetirizine (ZYRTEC) 10 MG tablet TAKE 1 TABLET BY MOUTH EVERY DAY (Patient taking differently: Take 10 mg by mouth daily as needed for allergies or rhinitis.) 90 tablet 3    dolutegravir (TIVICAY) 50 MG tablet Take 1 tablet (50 mg total) by mouth daily. 30 tablet 11   emtricitabine-tenofovir AF (DESCOVY) 200-25 MG tablet Take 1 tablet by mouth daily. 30 tablet 11   fluticasone (FLONASE) 50 MCG/ACT nasal spray SPRAY 2 SPRAYS INTO EACH NOSTRIL EVERY DAY 16 mL 1   triamcinolone ointment (KENALOG) 0.5 % Apply 1 application topically 2 (two) times daily. 30 g 2   amLODipine (NORVASC) 10 MG tablet TAKE 1 TABLET BY MOUTH EVERY DAY (Patient taking differently: Take 10 mg by mouth daily.) 90 tablet 3   atorvastatin (LIPITOR) 40 MG tablet TAKE 1 TABLET BY MOUTH DAILY AT 6 PM. (Patient taking differently: Take 40 mg by mouth daily.) 90 tablet 1   clopidogrel (PLAVIX) 75 MG tablet TAKE 1 TABLET BY MOUTH EVERY DAY 90 tablet 3   hydrochlorothiazide (HYDRODIURIL) 25 MG tablet Take 1 tablet (25 mg total) by mouth daily. 90 tablet 3   PARoxetine (PAXIL) 10 MG tablet TAKE 1 TABLET BY MOUTH EVERY DAY 90 tablet 4   traZODone (DESYREL) 50 MG tablet TAKE 1/2 TABLETS BY MOUTH AT BEDTIME AS NEEDED FOR SLEEP. 30 tablet 5   Vitamin D, Ergocalciferol, (DRISDOL) 1.25 MG (50000 UNIT) CAPS capsule TAKE 1 CAPSULE BY MOUTH ONE TIME PER WEEK 8 capsule 1   cimetidine (TAGAMET) 200 MG tablet Take 1 tablet (200 mg total) by mouth 2 (two) times daily. (Patient taking differently: Take 200 mg by mouth 2 (two) times daily as needed (reflux).) 60 tablet 2   No facility-administered medications prior to visit.    Allergies  Allergen Reactions   Lisinopril Swelling    ROS Review of Systems    Objective:    Physical Exam Constitutional:      General: She is not in acute distress.    Appearance: Normal appearance.  HENT:     Head: Normocephalic and atraumatic.     Nose: Nose normal.     Mouth/Throat:     Mouth: Mucous membranes are moist.  Cardiovascular:     Rate and Rhythm: Normal rate and regular rhythm.     Pulses: Normal pulses.     Heart sounds: Normal heart sounds.  Pulmonary:      Effort: Pulmonary effort is normal.     Comments: diminshed Musculoskeletal:        General: Normal range of motion.     Cervical back: Normal range of motion.  Skin:    General: Skin is warm and dry.     Capillary Refill: Capillary refill takes less than 2 seconds.  Neurological:     General: No focal deficit present.  Mental Status: She is alert and oriented to person, place, and time.  Psychiatric:        Mood and Affect: Mood normal.        Behavior: Behavior normal.    BP (!) 129/98    Pulse 90    Temp (!) 97.4 F (36.3 C)    Ht 5\' 3"  (1.6 m)    Wt 142 lb 12.8 oz (64.8 kg)    LMP 08/04/2014    SpO2 97%    BMI 25.30 kg/m  Wt Readings from Last 3 Encounters:  02/04/22 142 lb 12.8 oz (64.8 kg)  11/26/21 150 lb (68 kg)  11/02/21 150 lb 0.6 oz (68.1 kg)     There are no preventive care reminders to display for this patient.   There are no preventive care reminders to display for this patient.  Lab Results  Component Value Date   TSH 3.152 03/28/2019   Lab Results  Component Value Date   WBC 3.2 (L) 09/08/2021   HGB 14.5 09/08/2021   HCT 41.7 09/08/2021   MCV 91.9 09/08/2021   PLT 154 09/08/2021   Lab Results  Component Value Date   NA 137 09/08/2021   K 3.5 09/08/2021   CO2 26 09/08/2021   GLUCOSE 96 09/08/2021   BUN 10 09/08/2021   CREATININE 0.86 09/08/2021   BILITOT 1.5 (H) 09/08/2021   ALKPHOS 62 09/08/2021   AST 27 09/08/2021   ALT 37 09/08/2021   PROT 6.7 09/08/2021   ALBUMIN 3.3 (L) 09/08/2021   CALCIUM 8.9 09/08/2021   ANIONGAP 7 09/08/2021   Lab Results  Component Value Date   CHOL 148 09/08/2021   Lab Results  Component Value Date   HDL 65 09/08/2021   Lab Results  Component Value Date   LDLCALC 71 09/08/2021   Lab Results  Component Value Date   TRIG 59 09/08/2021   Lab Results  Component Value Date   CHOLHDL 2.3 09/08/2021   Lab Results  Component Value Date   HGBA1C 5.4 09/07/2021      Assessment & Plan:   Problem  List Items Addressed This Visit       Cardiovascular and Mediastinum   Essential hypertension - Primary Elevated today due to anxiety related to appt Declined recheck  Encouraged on going compliance with current medication regimen Encouraged home monitoring and recording BP <130/80 Eating a heart-healthy diet with less salt Encouraged regular physical activity     Relevant Medications   amLODipine (NORVASC) 10 MG tablet   atorvastatin (LIPITOR) 40 MG tablet   hydrochlorothiazide (HYDRODIURIL) 25 MG tablet     Nervous and Auditory   Hemiplegia, late effect of cerebrovascular disease (HCC) Stable     Other   HIV (human immunodeficiency virus infection) ([place]) (Chronic) Enourage patient to keep all follow-up visits with your infectious disease. Take your medicines as directed Get all recommended lab tests Encourage patient to make choices that keep you healthy and protect others.    Vitamin D deficiency   Relevant Medications   Vitamin D, Ergocalciferol, (DRISDOL) 1.25 MG (50000 UNIT) CAPS capsule   Tobacco dependence Persistent    Alcohol dependence with uncomplicated intoxication ([place])   Other Visit Diagnoses     Insomnia     Stable  We discussed at length sleep hygiene measures including regular sleep schedule, optimal sleep environment, and relaxing presleep rituals. Avoid daytime naps. Avoid caffeine after noon. Avoid excess alcohol. Avoid tobacco. Recommended daily exercise  Relevant Medications   traZODone (DESYREL) 50 MG tablet   Gastroesophageal reflux disease without esophagitis     Stable   Continue current regimen   Relevant Medications   cimetidine (TAGAMET) 200 MG tablet       Meds ordered this encounter  Medications   amLODipine (NORVASC) 10 MG tablet    Sig: Take 1 tablet (10 mg total) by mouth daily.    Dispense:  90 tablet    Refill:  0    Order Specific Question:   Supervising Provider    Answer:   Tresa Garter [[id number]]    atorvastatin (LIPITOR) 40 MG tablet    Sig: Take 1 tablet (40 mg total) by mouth daily.    Dispense:  90 tablet    Refill:  0    Order Specific Question:   Supervising Provider    Answer:   Tresa Garter [id number]   clopidogrel (PLAVIX) 75 MG tablet    Sig: Take 1 tablet (75 mg total) by mouth daily.    Dispense:  90 tablet    Refill:  0    Order Specific Question:   Supervising Provider    Answer:   Tresa Garter [[id number]]   hydrochlorothiazide (HYDRODIURIL) 25 MG tablet    Sig: Take 1 tablet (25 mg total) by mouth daily.    Dispense:  90 tablet    Refill:  0    Order Specific Question:   Supervising Provider    Answer:   Tresa Garter [[id number]]   PARoxetine (PAXIL) 10 MG tablet    Sig: Take 1 tablet (10 mg total) by mouth daily.    Dispense:  90 tablet    Refill:  0    Order Specific Question:   Supervising Provider    Answer:   Tresa Garter [[id number]]   traZODone (DESYREL) 50 MG tablet    Sig: Take 1 tablet (50 mg total) by mouth at bedtime.    Dispense:  90 tablet    Refill:  0    Order Specific Question:   Supervising Provider    Answer:   Tresa Garter [[id number]]   cimetidine (TAGAMET) 200 MG tablet    Sig: Take 1 tablet (200 mg total) by mouth 2 (two) times daily.    Dispense:  60 tablet    Refill:  2    Order Specific Question:   Supervising Provider    Answer:   Tresa Garter [[id number]]   Vitamin D, Ergocalciferol, (DRISDOL) 1.25 MG (50000 UNIT) CAPS capsule    Sig: Take 1 capsule (50,000 Units total) by mouth every 7 (seven) days. Weekly    Dispense:  12 capsule    Refill:  0    Order Specific Question:   Supervising Provider    Answer:   Tresa Garter [id number]    Follow-up: Return in about 3 months (around 05/04/2022) for Follow up HTN [id number].    [name], NP

## 2022-02-04 NOTE — Patient Instructions (Addendum)
To schedule your colonoscopy   [facility] [phone] option 1  Managing Your Hypertension Hypertension, also called high blood pressure, is when the force of the blood pressing against the walls of the arteries is too strong. Arteries are blood vessels that carry blood from your heart throughout your body. Hypertension forces the heart to work harder to pump blood and may cause the arteries to become narrow or stiff. Understanding blood pressure readings Your personal target blood pressure may vary depending on your medical conditions, your age, and other factors. A blood pressure reading includes a higher number over a lower number. Ideally, your blood pressure should be below 120/80. You should know that: The first, or top, number is called the systolic pressure. It is a measure of the pressure in your arteries as your heart beats. The second, or bottom number, is called the diastolic pressure. It is a measure of the pressure in your arteries as the heart relaxes. Blood pressure is classified into four stages. Based on your blood pressure reading, your health care provider may use the following stages to determine what type of treatment you need, if any. Systolic pressure and diastolic pressure are measured in a unit called mmHg. Normal Systolic pressure: below 151. Diastolic pressure: below 80. Elevated Systolic pressure: 761-607. Diastolic pressure: below 80. Hypertension stage 1 Systolic pressure: 371-062. Diastolic pressure: 69-48. Hypertension stage 2 Systolic pressure: 546 or above. Diastolic pressure: 90 or above. How can this condition affect me? Managing your hypertension is an important responsibility. Over time, hypertension can damage the arteries and decrease blood flow to important parts of the body, including the brain, heart, and kidneys. Having untreated or uncontrolled hypertension can lead to: A heart attack. A stroke. A weakened blood vessel  (aneurysm). Heart failure. Kidney damage. Eye damage. Metabolic syndrome. Memory and concentration problems. Vascular dementia. What actions can I take to manage this condition? Hypertension can be managed by making lifestyle changes and possibly by taking medicines. Your health care provider will help you make a plan to bring your blood pressure within a normal range. Nutrition  Eat a diet that is high in fiber and potassium, and low in salt (sodium), added sugar, and fat. An example eating plan is called the Dietary Approaches to Stop Hypertension (DASH) diet. To eat this way: Eat plenty of fresh fruits and vegetables. Try to fill one-half of your plate at each meal with fruits and vegetables. Eat whole grains, such as whole-wheat pasta, brown rice, or whole-grain bread. Fill about one-fourth of your plate with whole grains. Eat low-fat dairy products. Avoid fatty cuts of meat, processed or cured meats, and poultry with skin. Fill about one-fourth of your plate with lean proteins such as fish, chicken without skin, beans, eggs, and tofu. Avoid pre-made and processed foods. These tend to be higher in sodium, added sugar, and fat. Reduce your daily sodium intake. Most people with hypertension should eat less than 1,500 mg of sodium a day. Lifestyle  Work with your health care provider to maintain a healthy body weight or to lose weight. Ask what an ideal weight is for you. Get at least 30 minutes of exercise that causes your heart to beat faster (aerobic exercise) most days of the week. Activities may include walking, swimming, or biking. Include exercise to strengthen your muscles (resistance exercise), such as weight lifting, as part of your weekly exercise routine. Try to do these types of exercises for 30 minutes at least 3 days a week. Do not  use any products that contain nicotine or tobacco, such as cigarettes, e-cigarettes, and chewing tobacco. If you need help quitting, ask your health  care provider. Control any long-term (chronic) conditions you have, such as high cholesterol or diabetes. Identify your sources of stress and find ways to manage stress. This may include meditation, deep breathing, or making time for fun activities. Alcohol use Do not drink alcohol if: Your health care provider tells you not to drink. You are pregnant, may be pregnant, or are planning to become pregnant. If you drink alcohol: Limit how much you use to: 0-1 drink a day for women. 0-2 drinks a day for men. Be aware of how much alcohol is in your drink. In the [place] one drink equals one 12 oz bottle of beer (355 mL), one 5 oz glass of wine (148 mL), or one 1 oz glass of hard liquor (44 mL). Medicines Your health care provider may prescribe medicine if lifestyle changes are not enough to get your blood pressure under control and if: Your systolic blood pressure is 130 or higher. Your diastolic blood pressure is 80 or higher. Take medicines only as told by your health care provider. Follow the directions carefully. Blood pressure medicines must be taken as told by your health care provider. The medicine does not work as well when you skip doses. Skipping doses also puts you at risk for problems. Monitoring Before you monitor your blood pressure: Do not smoke, drink caffeinated beverages, or exercise within 30 minutes before taking a measurement. Use the bathroom and empty your bladder (urinate). Sit quietly for at least 5 minutes before taking measurements. Monitor your blood pressure at home as told by your health care provider. To do this: Sit with your back straight and supported. Place your feet flat on the floor. Do not cross your legs. Support your arm on a flat surface, such as a table. Make sure your upper arm is at heart level. Each time you measure, take two or three readings one minute apart and record the results. You may also need to have your blood pressure checked regularly by  your health care provider. General information Talk with your health care provider about your diet, exercise habits, and other lifestyle factors that may be contributing to hypertension. Review all the medicines you take with your health care provider because there may be side effects or interactions. Keep all visits as told by your health care provider. Your health care provider can help you create and adjust your plan for managing your high blood pressure. Where to find more information National Heart, Lung, and Blood Institute: https://wilson-eaton.com/ American Heart Association: www.heart.org Contact a health care provider if: You think you are having a reaction to medicines you have taken. You have repeated (recurrent) headaches. You feel dizzy. You have swelling in your ankles. You have trouble with your vision. Get help right away if: You develop a severe headache or confusion. You have unusual weakness or numbness, or you feel faint. You have severe pain in your chest or abdomen. You vomit repeatedly. You have trouble breathing. These symptoms may represent a serious problem that is an emergency. Do not wait to see if the symptoms will go away. Get medical help right away. Call your local emergency services (911 in the [place] Do not drive yourself to the hospital. Summary Hypertension is when the force of blood pumping through your arteries is too strong. If this condition is not controlled, it may put you at risk  for serious complications. Your personal target blood pressure may vary depending on your medical conditions, your age, and other factors. For most people, a normal blood pressure is less than 120/80. Hypertension is managed by lifestyle changes, medicines, or both. Lifestyle changes to help manage hypertension include losing weight, eating a healthy, low-sodium diet, exercising more, stopping smoking, and limiting alcohol. This information is not intended to replace advice given  to you by your health care provider. Make sure you discuss any questions you have with your health care provider. Document Revised: 12/20/2019 Document Reviewed: 11/02/2019 Elsevier Patient Education  2022 [employer].

## 2022-02-06 ENCOUNTER — Other Ambulatory Visit (HOSPITAL_COMMUNITY): Payer: Self-pay

## 2022-02-10 LAB — CUP PACEART REMOTE DEVICE CHECK
Date Time Interrogation Session: 20230224234740
Implantable Pulse Generator Implant Date: 20200413

## 2022-02-11 ENCOUNTER — Ambulatory Visit (INDEPENDENT_AMBULATORY_CARE_PROVIDER_SITE_OTHER): Payer: Medicare Other

## 2022-02-11 DIAGNOSIS — I63132 Cerebral infarction due to embolism of left carotid artery: Secondary | ICD-10-CM

## 2022-02-13 ENCOUNTER — Ambulatory Visit
Admission: RE | Admit: 2022-02-13 | Discharge: 2022-02-13 | Disposition: A | Discharge status: Home / Self Care | Payer: Medicare Other | Source: Ambulatory Visit | Attending: Nurse Practitioner | Admitting: Nurse Practitioner

## 2022-02-13 ENCOUNTER — Encounter: Payer: Self-pay | Admitting: Gastroenterology

## 2022-02-13 DIAGNOSIS — R928 Other abnormal and inconclusive findings on diagnostic imaging of breast: Secondary | ICD-10-CM

## 2022-02-13 DIAGNOSIS — R922 Inconclusive mammogram: Secondary | ICD-10-CM | POA: Diagnosis not present

## 2022-02-15 ENCOUNTER — Other Ambulatory Visit (HOSPITAL_COMMUNITY): Payer: Self-pay

## 2022-02-18 NOTE — Progress Notes (Signed)
Carelink Summary Report / Loop Recorder 

## 2022-03-01 ENCOUNTER — Ambulatory Visit: Payer: Medicare Other | Admitting: Nurse Practitioner

## 2022-03-06 ENCOUNTER — Encounter: Payer: Self-pay | Admitting: Gastroenterology

## 2022-03-06 ENCOUNTER — Ambulatory Visit (INDEPENDENT_AMBULATORY_CARE_PROVIDER_SITE_OTHER): Payer: Medicare Other | Admitting: Gastroenterology

## 2022-03-06 ENCOUNTER — Telehealth: Payer: Self-pay

## 2022-03-06 VITALS — BP 112/80 | HR 80 | Ht 63.0 in | Wt 144.0 lb

## 2022-03-06 DIAGNOSIS — Z1211 Encounter for screening for malignant neoplasm of colon: Secondary | ICD-10-CM | POA: Diagnosis not present

## 2022-03-06 DIAGNOSIS — Z7902 Long term (current) use of antithrombotics/antiplatelets: Secondary | ICD-10-CM

## 2022-03-06 DIAGNOSIS — R1319 Other dysphagia: Secondary | ICD-10-CM

## 2022-03-06 MED ORDER — PLENVU 140 G PO SOLR: 140.0000 g | ORAL | 0 refills | Status: DC

## 2022-03-06 NOTE — Progress Notes (Signed)
? ? ?[facility] Consult Note: ? ?History: ?Abigail Medina ?03/06/2022 ? ?Referring provider: [name], NP ? ?Reason for consult/chief complaint: Colon Cancer Screening (Patient taking Plavix, no issues) ? ? ?Subjective  ?HPI: ? ?This is a very pleasant 56 year old woman referred by colon cancer screening. ? ?Since her TIA/CVA in September of last year, she has not seen neurology in follow-up.  She has been seen by infectious disease and primary care, most recently 02/04/2022. ? ?Abigail Medina was seen as a telemedicine appointment with Dr. Lucio Edward on 05/18/2019 for consideration of colon cancer screening, though at that point she was less than 3 months out from an acute CVA that had occurred 03/27/2019.  The reported plan was to contact the patient a month later for scheduling.  There is a phone note from February 2021 indicating need for appointment with Dr. Fuller Plan, though that does not appear to have occurred. ? ?She denies chronic abdominal pain, altered bowel habits, and for years has had some episodic small-volume rectal bleeding, usually on the paper and has attributed to hemorrhoids.  She also has episodic solid food dysphagia, usually to meat.  She denies heartburn or regurgitation. ? ?ROS: ? ?Review of Systems  ?Constitutional:  Negative for appetite change and unexpected weight change.  ?HENT:  Negative for mouth sores and voice change.   ?Eyes:  Negative for pain and redness.  ?Respiratory:  Negative for cough and shortness of breath.   ?Cardiovascular:  Negative for chest pain and palpitations.  ?Genitourinary:  Negative for dysuria and hematuria.  ?Musculoskeletal:  Negative for arthralgias and myalgias.  ?Skin:  Negative for pallor and rash.  ?Neurological:  Negative for weakness and headaches.  ?     Residual numbness and tingling from prior CVAs  ?Hematological:  Negative for adenopathy.  ? ? ?Past Medical History: ?Past Medical History:  ?Diagnosis Date  ? HIV (human  immunodeficiency virus infection) ([place])   ? Hypertension   ? Seizures ([place])   ? Stroke [facility])   ? Vision abnormalities   ? ?From hospital discharge summary 09/08/2021: ? ?"56 year old with past medical history significant for HIV, recurrent CVA, thoracic aortic aneurysm, hypertension, hyperlipidemia who presented with concern of left-sided numbness and weakness.  She had a history of CVA with residual left-sided hemiparesis in 2012 recurrent stroke back in 2020 and had a loop recorder by cardiology but believes no arrhythmia was ever found.  Telemetry neuro consulted and recommendation given.  MRI of the brain and MRA head and neck are unremarkable.  Patient was transferred to [facility] for further evaluation by neurologist.  Patient was evaluated by the stroke team.  They recommended to continue with current medications and no further evaluation needed.  Patient will follow up with Dr. [name] in 40-month ?  ?1-Worsening left-sided weakness with paresthesia of the left upper extremity:  ?TIA vs unmasking previous stroke symptoms setting stress, insomnia.  ?Improved.  She only have mild left hand weakness. ?MRI/MRA negative. ?LDL 71, hemoglobin A1c 5.4.  UDS negative. ?Patient was evaluated by PT OT no further recommendation needed. ?echo unremarkable ?Per neurology continue with current management Plavix and statins ?Counseling in regards smoking was provided to the patient. ?B-12 elevated. ?  ?Mild transaminases: ?Resolved. ?  ?Hypertension: Resume Norvasc at discharge.  Plan to hold hydrochlorothiazide for now blood pressure has been in the 130s and 120s ?Resumption of hydrochlorothiazide as an outpatient. ?Hypokalemia: Corrected ?HIV: Continue with current medications. ?History of ascending thoracic aortic aneurysm: Follow-up as an  outpatient" ? ? ?Past Surgical History: ?Past Surgical History:  ?Procedure Laterality Date  ? LOOP RECORDER INSERTION N/A 03/29/2019  ? Procedure: LOOP RECORDER INSERTION;  Surgeon:  Constance Haw, MD;  Location: [facility];  Service: Cardiovascular;  Laterality: N/A;  ? ? ? ?Family History: ?Family History  ?Problem Relation Age of Onset  ? Hypertension Mother   ? ? ?Social History: ?Social History  ? ?Socioeconomic History  ? Marital status: Single  ?  Spouse name: Not on file  ? Number of children: Not on file  ? Years of education: Not on file  ? Highest education level: Not on file  ?Occupational History  ? Not on file  ?Tobacco Use  ? Smoking status: Every Day  ?  Packs/day: 0.50  ?  Types: Cigarettes  ?  Start date: 10/17/2015  ?  Last attempt to quit: 08/10/2019  ?  Years since quitting: 2.5  ? Smokeless tobacco: Never  ? Tobacco comments:  ?  picked up smoking after her husband died 2021-03-23  ?    ?   1 to 3 cigs per day  ?Vaping Use  ? Vaping Use: Never used  ?Substance and Sexual Activity  ? Alcohol use: Yes  ?  Alcohol/week: 60.0 standard drinks  ?  Types: 60 Standard drinks or equivalent per week  ?  Comment: occ  ? Drug use: No  ? Sexual activity: Not Currently  ?  Partners: Male  ?  Birth control/protection: Condom  ?  Comment: offered condoms  ?Other Topics Concern  ? Not on file  ?Social History Narrative  ? Not on file  ? ?Social Determinants of Health  ? ?Financial Resource Strain: Not on file  ?Food Insecurity: Not on file  ?Transportation Needs: Not on file  ?Physical Activity: Not on file  ?Stress: Not on file  ?Social Connections: Not on file  ? ? ?Allergies: ?Allergies  ?Allergen Reactions  ? Lisinopril Swelling  ? ? ?Outpatient Meds: ?Current Outpatient Medications  ?Medication Sig Dispense Refill  ? albuterol (PROVENTIL HFA;VENTOLIN HFA) 108 (90 Base) MCG/ACT inhaler Inhale 2 puffs into the lungs every 4 (four) hours as needed for wheezing or shortness of breath (cough, shortness of breath or wheezing.). 1 Inhaler 6  ? amLODipine (NORVASC) 10 MG tablet Take 1 tablet (10 mg total) by mouth daily. 90 tablet 0  ? atorvastatin (LIPITOR) 40 MG tablet Take 1  tablet (40 mg total) by mouth daily. 90 tablet 0  ? cetirizine (ZYRTEC) 10 MG tablet TAKE 1 TABLET BY MOUTH EVERY DAY (Patient taking differently: Take 10 mg by mouth daily as needed for allergies or rhinitis.) 90 tablet 3  ? cimetidine (TAGAMET) 200 MG tablet Take 1 tablet (200 mg total) by mouth 2 (two) times daily. 60 tablet 2  ? clopidogrel (PLAVIX) 75 MG tablet Take 1 tablet (75 mg total) by mouth daily. 90 tablet 0  ? dolutegravir (TIVICAY) 50 MG tablet Take 1 tablet (50 mg total) by mouth daily. 30 tablet 11  ? emtricitabine-tenofovir AF (DESCOVY) 200-25 MG tablet Take 1 tablet by mouth daily. 30 tablet 11  ? fluticasone (FLONASE) 50 MCG/ACT nasal spray SPRAY 2 SPRAYS INTO EACH NOSTRIL EVERY DAY 16 mL 1  ? hydrochlorothiazide (HYDRODIURIL) 25 MG tablet Take 1 tablet (25 mg total) by mouth daily. 90 tablet 0  ? PARoxetine (PAXIL) 10 MG tablet Take 1 tablet (10 mg total) by mouth daily. 90 tablet 0  ? PEG-KCl-NaCl-NaSulf-Na Asc-C (PLENVU) 140 g  SOLR Take 140 g by mouth as directed. 1 each 0  ? traZODone (DESYREL) 50 MG tablet Take 1 tablet (50 mg total) by mouth at bedtime. 90 tablet 0  ? triamcinolone ointment (KENALOG) 0.5 % Apply 1 application topically 2 (two) times daily. 30 g 2  ? Vitamin D, Ergocalciferol, (DRISDOL) 1.25 MG (50000 UNIT) CAPS capsule Take 1 capsule (50,000 Units total) by mouth every 7 (seven) days. Weekly 12 capsule 0  ? ?No current facility-administered medications for this visit.  ? ? ? ? ?___________________________________________________________________ ?Objective  ? ?Exam: ? ?BP 112/80   Pulse 80   Ht '5\' 3"'$  (1.6 m)   Wt 144 lb (65.3 kg)   LMP 08/04/2014   BMI 25.51 kg/m?  ?Wt Readings from Last 3 Encounters:  ?03/06/22 144 lb (65.3 kg)  ?02/04/22 142 lb 12.8 oz (64.8 kg)  ?11/26/21 150 lb (68 kg)  ? ? ?General: Well-appearing ?Eyes: sclera anicteric, no redness ?ENT: oral mucosa moist without lesions, no cervical or supraclavicular lymphadenopathy.  Upper and lower dentures in  good repair ?CV: RRR without murmur, S1/S2, no JVD, no peripheral edema ?Resp: clear to auscultation bilaterally, normal RR and effort noted ?GI: soft, no tenderness, with active bowel sounds. No guarding or palpable org

## 2022-03-06 NOTE — Patient Instructions (Signed)
If you are age 58 or older, your body mass index should be between 23-30. Your Body mass index is 25.51 kg/m?Marland Kitchen If this is out of the aforementioned range listed, please consider follow up with your Primary Care Provider. ? ?If you are age 29 or younger, your body mass index should be between 19-25. Your Body mass index is 25.51 kg/m?Marland Kitchen If this is out of the aformentioned range listed, please consider follow up with your Primary Care Provider.  ? ?________________________________________________________ ? ?The [facility] providers would like to encourage you to use [facility] to communicate with providers for non-urgent requests or questions.  Due to long hold times on the telephone, sending your provider a message by [facility] may be a faster and more efficient way to get a response.  Please allow 48 business hours for a response.  Please remember that this is for non-urgent requests.  ?_______________________________________________________ ? ?You have been scheduled for an endoscopy and colonoscopy. Please follow the written instructions given to you at your visit today. ?Please pick up your prep supplies at the pharmacy within the next 1-3 days. ?If you use inhalers (even only as needed), please bring them with you on the day of your procedure.  ? ?You will be contacted by our office prior to your procedure for directions on holding your Plavix.  If you do not hear from our office 1 week prior to your scheduled procedure, please call [phone] to discuss.  ? ?Due to recent changes in healthcare laws, you may see the results of your imaging and laboratory studies on MyChart before your provider has had a chance to review them.  We understand that in some cases there may be results that are confusing or concerning to you. Not all laboratory results come back in the same time frame and the provider may be waiting for multiple results in order to interpret others.  Please give [place] 48 hours in order for your provider to  thoroughly review all the results before contacting the office for clarification of your results.  ? ?It was a pleasure to see you today! ? ?Thank you for trusting me with your gastrointestinal care!   ? ? ? ?

## 2022-03-06 NOTE — Telephone Encounter (Signed)
[facility] Pre-operative Risk Assessment  ?   ?Request for surgical clearance:     Endoscopy Procedure ? ?What type of surgery is being performed?     EGD/Colon ? ?When is this surgery scheduled?     04-04-2022  ? ?What type of clearance is required ?   Pharmacy ? ?Are there any medications that need to be held prior to surgery and how long? Yes, Plavix,  5 days. Dr.Danis would like to put her on Aspirin 40m during the days she holds the Plavix. ? ?Practice name and name of physician performing surgery?      [facility] ? ?What is your office phone and fax number?      Phone- [phone] Fax- [phone]? ?Anesthesia type (None, local, MAC, general) ?       MAC  ?

## 2022-03-07 ENCOUNTER — Other Ambulatory Visit (HOSPITAL_COMMUNITY): Payer: Self-pay

## 2022-03-08 ENCOUNTER — Ambulatory Visit: Payer: Medicare Other | Admitting: Nurse Practitioner

## 2022-03-11 ENCOUNTER — Encounter: Payer: Self-pay | Admitting: Nurse Practitioner

## 2022-03-11 ENCOUNTER — Other Ambulatory Visit: Payer: Self-pay

## 2022-03-11 ENCOUNTER — Ambulatory Visit (INDEPENDENT_AMBULATORY_CARE_PROVIDER_SITE_OTHER): Payer: Medicare Other | Admitting: Nurse Practitioner

## 2022-03-11 VITALS — BP 114/91 | HR 75 | Temp 98.3°F | Ht 63.0 in | Wt 142.4 lb

## 2022-03-11 DIAGNOSIS — R052 Subacute cough: Secondary | ICD-10-CM | POA: Diagnosis not present

## 2022-03-11 MED ORDER — BENZONATATE 100 MG PO CAPS: 100.0000 mg | ORAL_CAPSULE | Freq: Three times a day (TID) | ORAL | 0 refills | Status: DC | PRN

## 2022-03-11 NOTE — Progress Notes (Signed)
? ?  [facility] ?[address], [place]  [address] ?Phone:  [phone]   Fax:  [phone] ?Subjective:  ?  ? Abigail Medina is a 56 y.o. female who presents for evaluation of productive cough. Symptoms began 2 months ago. Symptoms have been gradually improving since that time. Past history is significant for  allergic rhinitis  . ? ?The following portions of the patient's history were reviewed and updated as appropriate: allergies, current medications, past family history, past medical history, past social history, past surgical history, and problem list. ? ?Review of Systems ?Pertinent items are noted in HPI.  ?  ?Objective:  ? ? BP (!) 114/91   Pulse 75   Temp 98.3 ?F (36.8 ?C)   Ht '5\' 3"'$  (1.6 m)   Wt 64.6 kg   LMP 08/04/2014   SpO2 99%   BMI 25.23 kg/m?  ? ?General Appearance:    Alert, cooperative, no distress, appears stated age  ?Head:    Normocephalic, without obvious abnormality, atraumatic  ?Eyes:    PERRL, conjunctiva/corneas clear, EOM's intact, fundi  ?  benign, both eyes  ?Ears:    Normal TM's and external ear canals, both ears  ?Nose:   Nares normal, septum midline, mucosa normal, no drainage    or sinus tenderness  ?Throat:   Lips, mucosa, and tongue normal; teeth and gums normal  ?Neck:   Supple, symmetrical, trachea midline, no adenopathy;  ?  thyroid:  no enlargement/tenderness/nodules; no carotid ?  bruit or JVD  ?Back:     Symmetric, no curvature, ROM normal, no CVA tenderness  ?Lungs:     Clear to auscultation bilaterally, respirations unlabored  ?Chest Wall:    No tenderness or deformity  ? Heart:    Regular rate and rhythm, S1 and S2 normal, no murmur, rub   or gallop  ?Extremities:   Extremities normal, atraumatic, no cyanosis or edema  ?Pulses:   2+ and symmetric all extremities  ?Skin:   Skin color, texture, turgor normal, no rashes or lesions  ?Lymph nodes:   Cervical, supraclavicular normal  ?Neurologic:   CNII-XII intact, normal strength,  sensation and reflexes  ?  throughout  ?  ?  ?Assessment:  ? ? Allergic Rhinitis and Cough  ? 1. Subacute cough ?- benzonatate (TESSALON) 100 MG capsule; Take 1 capsule (100 mg total) by mouth 3 (three) times daily as needed for cough.  Dispense: 30 capsule; Refill: 0 ?  ? ?Plan:  ? ? Worsening signs and symptoms discussed. ?Rest, fluids, acetaminophen, and humidification. ?Follow up as needed for persistent, worsening cough, or appearance of new symptoms.  ?

## 2022-03-11 NOTE — Patient Instructions (Signed)
Coricidin HBP for cough ? ? ?Cough, Adult ?A cough helps to clear your throat and lungs. A cough may be a sign of an illness or another medical condition. ?An acute cough may only last 2-3 weeks, while a chronic cough may last 8 or more weeks. ?Many things can cause a cough. They include: ?Germs (viruses or bacteria) that attack the airway. ?Breathing in things that bother (irritate) your lungs. ?Allergies. ?Asthma. ?Mucus that runs down the back of your throat (postnasal drip). ?Smoking. ?Acid backing up from the stomach into the tube that moves food from the mouth to the stomach (gastroesophageal reflux). ?Some medicines. ?Lung problems. ?Other medical conditions, such as heart failure or a blood clot in the lung (pulmonary embolism). ?Follow these instructions at home: ?Medicines ?Take over-the-counter and prescription medicines only as told by your doctor. ?Talk with your doctor before you take medicines that stop a cough (cough suppressants). ?Lifestyle ? ?Do not smoke, and try not to be around smoke. Do not use any products that contain nicotine or tobacco, such as cigarettes, e-cigarettes, and chewing tobacco. If you need help quitting, ask your doctor. ?Drink enough fluid to keep your pee (urine) pale yellow. ?Avoid caffeine. ?Do not drink alcohol if your doctor tells you not to drink. ?General instructions ? ?Watch for any changes in your cough. Tell your doctor about them. ?Always cover your mouth when you cough. ?Stay away from things that make you cough, such as perfume, candles, campfire smoke, or cleaning products. ?If the air is dry, use a cool mist vaporizer or humidifier in your home. ?If your cough is worse at night, try using extra pillows to raise your head up higher while you sleep. ?Rest as needed. ?Keep all follow-up visits as told by your doctor. This is important. ?Contact a doctor if: ?You have new symptoms. ?You cough up pus. ?Your cough does not get better after 2-3 weeks, or your cough  gets worse. ?Cough medicine does not help your cough and you are not sleeping well. ?You have pain that gets worse or pain that is not helped with medicine. ?You have a fever. ?You are losing weight and you do not know why. ?You have night sweats. ?Get help right away if: ?You cough up blood. ?You have trouble breathing. ?Your heartbeat is very fast. ?These symptoms may be an emergency. Do not wait to see if the symptoms will go away. Get medical help right away. Call your local emergency services (911 in the [place] Do not drive yourself to the hospital. ?Summary ?A cough helps to clear your throat and lungs. Many things can cause a cough. ?Take over-the-counter and prescription medicines only as told by your doctor. ?Always cover your mouth when you cough. ?Contact a doctor if you have new symptoms or you have a cough that does not get better or gets worse. ?This information is not intended to replace advice given to you by your health care provider. Make sure you discuss any questions you have with your health care provider. ?Document Revised: 01/21/2020 Document Reviewed: 12/21/2018 ?Elsevier Patient Education ? [place]. ? ?

## 2022-03-12 ENCOUNTER — Telehealth: Payer: Self-pay | Admitting: Gastroenterology

## 2022-03-12 NOTE — Telephone Encounter (Signed)
Patient called to discuss prep medication Plenvu. Per patient, she cannot afford prep medication. Patient is inquiring about any discounts or alternatives. Please advise. ?

## 2022-03-13 NOTE — Telephone Encounter (Signed)
Left message for pt to return call.

## 2022-03-14 NOTE — Telephone Encounter (Signed)
Patient will come to the office to pick up a prep kit.  ?

## 2022-03-14 NOTE — Telephone Encounter (Signed)
Patient has been notified and aware to hold the Plavix 5 days prior. ?

## 2022-03-19 ENCOUNTER — Other Ambulatory Visit (HOSPITAL_COMMUNITY): Payer: Self-pay

## 2022-03-20 ENCOUNTER — Other Ambulatory Visit (HOSPITAL_COMMUNITY): Payer: Self-pay

## 2022-03-21 ENCOUNTER — Other Ambulatory Visit (HOSPITAL_COMMUNITY): Payer: Self-pay

## 2022-04-04 ENCOUNTER — Encounter: Payer: Medicare Other | Admitting: Gastroenterology

## 2022-04-09 ENCOUNTER — Other Ambulatory Visit (HOSPITAL_COMMUNITY): Payer: Self-pay

## 2022-04-11 ENCOUNTER — Other Ambulatory Visit (HOSPITAL_COMMUNITY): Payer: Self-pay

## 2022-04-12 ENCOUNTER — Telehealth: Payer: Self-pay

## 2022-04-12 NOTE — Telephone Encounter (Signed)
Attempted to contact patient to schedule medicare annual wellness visit.  ?

## 2022-04-20 ENCOUNTER — Other Ambulatory Visit: Payer: Self-pay | Admitting: Internal Medicine

## 2022-04-29 ENCOUNTER — Other Ambulatory Visit (HOSPITAL_COMMUNITY): Payer: Self-pay

## 2022-04-30 ENCOUNTER — Other Ambulatory Visit: Payer: Self-pay | Admitting: Nurse Practitioner

## 2022-04-30 DIAGNOSIS — E559 Vitamin D deficiency, unspecified: Secondary | ICD-10-CM

## 2022-05-01 ENCOUNTER — Telehealth: Payer: Self-pay

## 2022-05-01 ENCOUNTER — Ambulatory Visit (AMBULATORY_SURGERY_CENTER): Payer: Medicare Other | Admitting: Gastroenterology

## 2022-05-01 ENCOUNTER — Encounter: Payer: Self-pay | Admitting: Gastroenterology

## 2022-05-01 VITALS — BP 121/91 | HR 71 | Temp 96.9°F | Resp 18 | Ht 63.0 in | Wt 144.0 lb

## 2022-05-01 DIAGNOSIS — D122 Benign neoplasm of ascending colon: Secondary | ICD-10-CM | POA: Diagnosis not present

## 2022-05-01 DIAGNOSIS — Z1211 Encounter for screening for malignant neoplasm of colon: Secondary | ICD-10-CM

## 2022-05-01 DIAGNOSIS — K449 Diaphragmatic hernia without obstruction or gangrene: Secondary | ICD-10-CM

## 2022-05-01 DIAGNOSIS — R1319 Other dysphagia: Secondary | ICD-10-CM

## 2022-05-01 DIAGNOSIS — K222 Esophageal obstruction: Secondary | ICD-10-CM

## 2022-05-01 DIAGNOSIS — I10 Essential (primary) hypertension: Secondary | ICD-10-CM | POA: Diagnosis not present

## 2022-05-01 DIAGNOSIS — K2940 Chronic atrophic gastritis without bleeding: Secondary | ICD-10-CM

## 2022-05-01 MED ORDER — SODIUM CHLORIDE 0.9 % IV SOLN: 500.0000 mL | Freq: Once | INTRAVENOUS | Status: DC

## 2022-05-01 MED ORDER — PANTOPRAZOLE SODIUM 40 MG PO TBEC: 40.0000 mg | DELAYED_RELEASE_TABLET | Freq: Every day | ORAL | 3 refills | Status: DC

## 2022-05-01 NOTE — Progress Notes (Signed)
History and Physical: ? This patient presents for endoscopic testing for: ?Encounter Diagnoses  ?Name Primary?  ? Special screening for malignant neoplasms, colon Yes  ? Esophageal dysphagia   ? ? ?This 56 year old woman presents for outpatient EGD and colonoscopy for esophageal dysphagia and colon cancer screening.  Clinical details of her history can be found in the 03/06/2022 [facility] office consult note.  No clinical changes since that visit. ?She has held Plavix for the last 5 days. ? ?Patient is otherwise without complaints or active issues today. ? ? ?Past Medical History: ?Past Medical History:  ?Diagnosis Date  ? HIV (human immunodeficiency virus infection) ([place])   ? Hypertension   ? Seizures ([place])   ? Stroke [facility])   ? Vision abnormalities   ? ? ? ?Past Surgical History: ?Past Surgical History:  ?Procedure Laterality Date  ? LOOP RECORDER INSERTION N/A 03/29/2019  ? Procedure: LOOP RECORDER INSERTION;  Surgeon: Constance Haw, MD;  Location: [place] [facility];  Service: Cardiovascular;  Laterality: N/A;  ? ? ?Allergies: ?Allergies  ?Allergen Reactions  ? Lisinopril Swelling  ? ? ?Outpatient Meds: ?Current Outpatient Medications  ?Medication Sig Dispense Refill  ? albuterol (PROVENTIL HFA;VENTOLIN HFA) 108 (90 Base) MCG/ACT inhaler Inhale 2 puffs into the lungs every 4 (four) hours as needed for wheezing or shortness of breath (cough, shortness of breath or wheezing.). 1 Inhaler 6  ? amLODipine (NORVASC) 10 MG tablet Take 1 tablet (10 mg total) by mouth daily. 90 tablet 0  ? atorvastatin (LIPITOR) 40 MG tablet Take 1 tablet (40 mg total) by mouth daily. 90 tablet 0  ? benzonatate (TESSALON) 100 MG capsule Take 1 capsule (100 mg total) by mouth 3 (three) times daily as needed for cough. 30 capsule 0  ? cetirizine (ZYRTEC) 10 MG tablet TAKE 1 TABLET BY MOUTH EVERY DAY (Patient taking differently: Take 10 mg by mouth daily as needed for allergies or rhinitis.) 90 tablet 3  ? cimetidine (TAGAMET) 200 MG tablet  Take 1 tablet (200 mg total) by mouth 2 (two) times daily. 60 tablet 2  ? clopidogrel (PLAVIX) 75 MG tablet Take 1 tablet (75 mg total) by mouth daily. 90 tablet 0  ? dolutegravir (TIVICAY) 50 MG tablet Take 1 tablet (50 mg total) by mouth daily. 30 tablet 11  ? emtricitabine-tenofovir AF (DESCOVY) 200-25 MG tablet Take 1 tablet by mouth daily. 30 tablet 11  ? fluticasone (FLONASE) 50 MCG/ACT nasal spray SPRAY 2 SPRAYS INTO EACH NOSTRIL EVERY DAY 16 mL 1  ? hydrochlorothiazide (HYDRODIURIL) 25 MG tablet Take 1 tablet (25 mg total) by mouth daily. 90 tablet 0  ? PARoxetine (PAXIL) 10 MG tablet Take 1 tablet (10 mg total) by mouth daily. 90 tablet 0  ? PEG-KCl-NaCl-NaSulf-Na Asc-C (PLENVU) 140 g SOLR Take 140 g by mouth as directed. 1 each 0  ? traZODone (DESYREL) 50 MG tablet Take 1 tablet (50 mg total) by mouth at bedtime. 90 tablet 0  ? triamcinolone ointment (KENALOG) 0.5 % Apply 1 application topically 2 (two) times daily. 30 g 2  ? Vitamin D, Ergocalciferol, (DRISDOL) 1.25 MG (50000 UNIT) CAPS capsule Take 1 capsule (50,000 Units total) by mouth every 7 (seven) days. Weekly 12 capsule 0  ? ?Current Facility-Administered Medications  ?Medication Dose Route Frequency Provider Last Rate Last Admin  ? 0.9 %  sodium chloride infusion  500 mL Intravenous Once Doran Stabler, MD      ? ? ? ? ?___________________________________________________________________ ?Objective  ? ?  Exam: ? ?LMP 08/04/2014  ? ?CV: RRR without murmur, S1/S2 ?Resp: clear to auscultation bilaterally, normal RR and effort noted ?GI: soft, no tenderness, with active bowel sounds. ? ? ?Assessment: ?Encounter Diagnoses  ?Name Primary?  ? Special screening for malignant neoplasms, colon Yes  ? Esophageal dysphagia   ? ? ? ?Plan: ?Colonoscopy ?EGD with dilation ? ? ?The patient is appropriate for an endoscopic procedure in the ambulatory setting. ? ? - Wilfrid Lund, MD ? ? ? ? ?

## 2022-05-01 NOTE — Progress Notes (Signed)
Pt's states no medical or surgical changes since previsit or office visit. 

## 2022-05-01 NOTE — Op Note (Signed)
[place] ?Patient Name: Gertha Lichtenberg ?Procedure Date: 05/01/2022 10:10 AM ?MRN: [id number] ?Endoscopist: Estill Cotta. Loletha Carrow , MD ?Age: 56 ?Referring MD:  ?Date of Birth: [birth year] ?Gender: Female ?Account #: [id number] ?Procedure:                Upper GI endoscopy ?Indications:              Esophageal dysphagia ?Medicines:                Monitored Anesthesia Care ?Procedure:                Pre-Anesthesia Assessment: ?                          - Prior to the procedure, a History and Physical  ?                          was performed, and patient medications and  ?                          allergies were reviewed. The patient's tolerance of  ?                          previous anesthesia was also reviewed. The risks  ?                          and benefits of the procedure and the sedation  ?                          options and risks were discussed with the patient.  ?                          All questions were answered, and informed consent  ?                          was obtained. Prior Anticoagulants: The patient has  ?                          taken Plavix (clopidogrel), last dose was 5 days  ?                          prior to procedure. ASA Grade Assessment: III - A  ?                          patient with severe systemic disease. After  ?                          reviewing the risks and benefits, the patient was  ?                          deemed in satisfactory condition to undergo the  ?                          procedure. ?  After obtaining informed consent, the endoscope was  ?                          passed under direct vision. Throughout the  ?                          procedure, the patient's blood pressure, pulse, and  ?                          oxygen saturations were monitored continuously. The  ?                          Endoscope was introduced through the mouth, and  ?                          advanced to the second part of duodenum. The upper  ?                           GI endoscopy was accomplished without difficulty.  ?                          The patient tolerated the procedure well. ?Scope In: ?Scope Out: ?Findings:                 The larynx was normal except for discoloration on  ?                          the right arytenoid and the hard palate from  ?                          smoking. (see photos) The arytenoids were also  ?                          asymmetric. ?                          A 1-2 cm hiatal hernia was present. ?                          A moderate to severe Schatzki ring was found at the  ?                          gastroesophageal junction (adult EGD scope able to  ?                          pass with minimal resistance - thus starting  ?                          diameter 9-26m). A TTS dilator was passed through  ?                          the scope. Dilation with a 13.5-14.5-15.5 mm  ?  balloon dilator was performed to 15.5 mm. The  ?                          dilation site was examined and showed moderate  ?                          mucosal disruption and moderate improvement in  ?                          luminal narrowing. ?                          Diffuse atrophic mucosa was found in the gastric  ?                          antrum. Several biopsies were obtained on the  ?                          greater curvature of the gastric body, on the  ?                          lesser curvature of the gastric body, on the  ?                          greater curvature of the gastric antrum and on the  ?                          lesser curvature of the gastric antrum with cold  ?                          forceps for histology. ?                          Patchy mild mucosal changes characterized by  ?                          erythema and scant flecks of heme were found in the  ?                          gastric body. ?                          The exam of the stomach was otherwise normal. ?                          The cardia and  gastric fundus were normal on  ?                          retroflexion. ?                          The examined duodenum was normal. ?Complications:            No immediate complications. ?Estimated Blood Loss:     Estimated blood loss was minimal. ?Impression:               -  Normal larynx. ?                          - 1-2 cm hiatal hernia. ?                          - Moderate Schatzki ring. Dilated. ?                          - Gastric mucosal atrophy. ?                          - Erythematous mucosa in the gastric body. ?                          - Normal examined duodenum. ?                          - Several biopsies were obtained on the greater  ?                          curvature of the gastric body, on the lesser  ?                          curvature of the gastric body, on the greater  ?                          curvature of the gastric antrum and on the lesser  ?                          curvature of the gastric antrum. ?Recommendation:           - Patient has a contact number available for  ?                          emergencies. The signs and symptoms of potential  ?                          delayed complications were discussed with the  ?                          patient. Return to normal activities tomorrow.  ?                          Written discharge instructions were provided to the  ?                          patient. ?                          - Resume previous diet. ?                          - Resume Plavix (clopidogrel) at prior dose  ?  tomorrow. ?                          - Use Protonix (pantoprazole) 40 mg PO daily. DIsp  ?                          #30, RF 3 ?                          - If you have been smoking, please do your best to  ?                          stop. ?                          - Follow up at an office appointment with me to be  ?                          scheduled. ?                          - Refer to an ENT specialist at appointment to be  ?                           scheduled. In-office laryngoscopy requested to  ?                          evaluate findings noted above. ?Brittny Spangle L. Loletha Carrow, MD ?05/01/2022 11:21:31 AM ?This report has been signed electronically. ?

## 2022-05-01 NOTE — Progress Notes (Signed)
Pt in recovery with monitors in place, VSS. Report given to receiving RN. Bite guard was placed with pt awake to ensure comfort. No dental or soft tissue damage noted. 

## 2022-05-01 NOTE — Progress Notes (Signed)
Called to room to assist during endoscopic procedure.  Patient ID and intended procedure confirmed with present staff. Received instructions for my participation in the procedure from the performing physician.  

## 2022-05-01 NOTE — Op Note (Signed)
[facility] ?Patient Name: Abigail Medina ?Procedure Date: 05/01/2022 10:10 AM ?MRN: [id number] ?Endoscopist: Estill Cotta. Loletha Carrow , MD ?Age: 56 ?Referring MD:  ?Date of Birth: October 17, 1966 ?Gender: Female ?Account #: [id number] ?Procedure:                Colonoscopy ?Indications:              Screening for colorectal malignant neoplasm, This  ?                          is the patient's first colonoscopy ?Medicines:                Monitored Anesthesia Care ?Procedure:                Pre-Anesthesia Assessment: ?                          - Prior to the procedure, a History and Physical  ?                          was performed, and patient medications and  ?                          allergies were reviewed. The patient's tolerance of  ?                          previous anesthesia was also reviewed. The risks  ?                          and benefits of the procedure and the sedation  ?                          options and risks were discussed with the patient.  ?                          All questions were answered, and informed consent  ?                          was obtained. Prior Anticoagulants: The patient has  ?                          taken Plavix (clopidogrel), last dose was 5 days  ?                          prior to procedure. ASA Grade Assessment: III - A  ?                          patient with severe systemic disease. After  ?                          reviewing the risks and benefits, the patient was  ?                          deemed in satisfactory condition to undergo the  ?  procedure. ?                          After obtaining informed consent, the colonoscope  ?                          was passed under direct vision. Throughout the  ?                          procedure, the patient's blood pressure, pulse, and  ?                          oxygen saturations were monitored continuously. The  ?                          Olympus CF-HQ190L ([id number]) Colonoscope was  ?                           introduced through the anus and advanced to the the  ?                          cecum, identified by appendiceal orifice and  ?                          ileocecal valve. The colonoscopy was performed  ?                          without difficulty. The patient tolerated the  ?                          procedure well. The quality of the bowel  ?                          preparation was excellent. The ileocecal valve,  ?                          appendiceal orifice, and rectum were photographed. ?Scope In: 10:36:23 AM ?Scope Out: 10:50:06 AM ?Scope Withdrawal Time: 0 hours 9 minutes 55 seconds  ?Total Procedure Duration: 0 hours 13 minutes 43 seconds  ?Findings:                 The perianal and digital rectal examinations were  ?                          normal. ?                          Repeat examination of right colon under NBI  ?                          performed. ?                          A diminutive polyp was found in the distal  ?  ascending colon. The polyp was semi-sessile. The  ?                          polyp was removed with a cold snare. Resection and  ?                          retrieval were complete. ?                          The exam was otherwise without abnormality on  ?                          direct views ( no rectal retroflexion due to narrow  ?                          anatomy). ?Complications:            No immediate complications. ?Estimated Blood Loss:     Estimated blood loss was minimal. ?Impression:               - One diminutive polyp in the distal ascending  ?                          colon, removed with a cold snare. Resected and  ?                          retrieved. ?                          - The examination was otherwise normal on direct  ?                          views. ?Recommendation:           - Patient has a contact number available for  ?                          emergencies. The signs and symptoms of potential  ?                           delayed complications were discussed with the  ?                          patient. Return to normal activities tomorrow.  ?                          Written discharge instructions were provided to the  ?                          patient. ?                          - Resume previous diet. ?                          - Resume Plavix (clopidogrel) at prior dose  ?  tomorrow. ?                          - Await pathology results. ?                          - Repeat colonoscopy is recommended for  ?                          surveillance. The colonoscopy date will be  ?                          determined after pathology results from today's  ?                          exam become available for review. ?                          - See the other procedure note for documentation of  ?                          additional recommendations. ?Laurella Tull L. Loletha Carrow, MD ?05/01/2022 10:53:31 AM ?This report has been signed electronically. ?

## 2022-05-01 NOTE — Telephone Encounter (Signed)
Per 05/01/22 procedure report   ?- Follow up at an office appointment with me to be scheduled. ?- Refer to an ENT specialist at appointment to be scheduled. In-office laryngoscopy ?requested to evaluate findings noted above.  ?Please send a referral to [facility] (Dr. Redmond Baseman) to evaluate laryngeal findings on EGD. ? ?Referral, records, demographic and insurance information faxed to Dr. Redmond Baseman at [facility] (P: [phone], F [phone]). ? ?Patient has been scheduled for a follow up appt with Dr. Loletha Carrow on Tuesday, 06/04/22 at 9:20 am. Appt information sent to patient via MyChart and mailed. ? ? ?

## 2022-05-01 NOTE — Patient Instructions (Signed)
YOU HAD AN ENDOSCOPIC PROCEDURE TODAY AT THE [facility]:   Refer to the procedure report that was given to you for any specific questions about what was found during the examination.  If the procedure report does not answer your questions, please call your gastroenterologist to clarify.  If you requested that your care partner not be given the details of your procedure findings, then the procedure report has been included in a sealed envelope for you to review at your convenience later.  YOU SHOULD EXPECT: Some feelings of bloating in the abdomen. Passage of more gas than usual.  Walking can help get rid of the air that was put into your GI tract during the procedure and reduce the bloating. If you had a lower endoscopy (such as a colonoscopy or flexible sigmoidoscopy) you may notice spotting of blood in your stool or on the toilet paper. If you underwent a bowel prep for your procedure, you may not have a normal bowel movement for a few days.  Please Note:  You might notice some irritation and congestion in your nose or some drainage.  This is from the oxygen used during your procedure.  There is no need for concern and it should clear up in a day or so.  SYMPTOMS TO REPORT IMMEDIATELY:   Following lower endoscopy (colonoscopy or flexible sigmoidoscopy):  Excessive amounts of blood in the stool  Significant tenderness or worsening of abdominal pains  Swelling of the abdomen that is new, acute  Fever of 100F or higher   Following upper endoscopy (EGD)  Vomiting of blood or coffee ground material  New chest pain or pain under the shoulder blades  Painful or persistently difficult swallowing  New shortness of breath  Fever of 100F or higher  Black, tarry-looking stools  For urgent or emergent issues, a gastroenterologist can be reached at any hour by calling [phone]. Do not use MyChart messaging for urgent concerns.    DIET:  We do recommend a small meal at first, but  then you may proceed to your regular diet.  Drink plenty of fluids but you should avoid alcoholic beverages for 24 hours.  ACTIVITY:  You should plan to take it easy for the rest of today and you should NOT DRIVE or use heavy machinery until tomorrow (because of the sedation medicines used during the test).    FOLLOW UP: Our staff will call the number listed on your records 48-72 hours following your procedure to check on you and address any questions or concerns that you may have regarding the information given to you following your procedure. If we do not reach you, we will leave a message.  We will attempt to reach you two times.  During this call, we will ask if you have developed any symptoms of COVID 19. If you develop any symptoms (ie: fever, flu-like symptoms, shortness of breath, cough etc.) before then, please call [phone].  If you test positive for Covid 19 in the 2 weeks post procedure, please call and report this information to us.    If any biopsies were taken you will be contacted by phone or by letter within the next 1-3 weeks.  Please call us at [phone] if you have not heard about the biopsies in 3 weeks.    SIGNATURES/CONFIDENTIALITY: You and/or your care partner have signed paperwork which will be entered into your electronic medical record.  These signatures attest to the fact that that the information above on   your After Visit Summary has been reviewed and is understood.  Full responsibility of the confidentiality of this discharge information lies with you and/or your care-partner. 

## 2022-05-03 ENCOUNTER — Telehealth: Payer: Self-pay

## 2022-05-03 NOTE — Telephone Encounter (Signed)
  Follow up Call-     05/01/2022   10:10 AM  Call back number  Post procedure Call Back phone  # [phone]  Permission to leave phone message Yes     Patient questions:  Do you have a fever, pain , or abdominal swelling? No. Pain Score  0 *  Have you tolerated food without any problems? Yes.    Have you been able to return to your normal activities? Yes.    Do you have any questions about your discharge instructions: Diet   No. Medications  No. Follow up visit  No.  Do you have questions or concerns about your Care? No.  Actions: * If pain score is 4 or above: No action needed, pain <4.

## 2022-05-06 ENCOUNTER — Ambulatory Visit: Payer: Medicare Other | Admitting: Nurse Practitioner

## 2022-05-07 ENCOUNTER — Encounter: Payer: Self-pay | Admitting: Gastroenterology

## 2022-05-08 ENCOUNTER — Other Ambulatory Visit: Payer: Self-pay | Admitting: Nurse Practitioner

## 2022-05-10 ENCOUNTER — Other Ambulatory Visit: Payer: Self-pay | Admitting: Nurse Practitioner

## 2022-05-10 DIAGNOSIS — I10 Essential (primary) hypertension: Secondary | ICD-10-CM

## 2022-05-11 ENCOUNTER — Other Ambulatory Visit: Payer: Self-pay | Admitting: Nurse Practitioner

## 2022-05-11 DIAGNOSIS — G4700 Insomnia, unspecified: Secondary | ICD-10-CM

## 2022-05-12 ENCOUNTER — Other Ambulatory Visit: Payer: Self-pay | Admitting: Internal Medicine

## 2022-05-12 DIAGNOSIS — G629 Polyneuropathy, unspecified: Secondary | ICD-10-CM

## 2022-05-14 NOTE — Telephone Encounter (Signed)
Okay to refill? 

## 2022-05-15 ENCOUNTER — Other Ambulatory Visit: Payer: Self-pay

## 2022-05-15 DIAGNOSIS — G629 Polyneuropathy, unspecified: Secondary | ICD-10-CM

## 2022-05-15 MED ORDER — CETIRIZINE HCL 10 MG PO TABS: 10.0000 mg | ORAL_TABLET | Freq: Every day | ORAL | 1 refills | Status: DC

## 2022-05-18 ENCOUNTER — Other Ambulatory Visit: Payer: Self-pay | Admitting: Internal Medicine

## 2022-05-23 ENCOUNTER — Other Ambulatory Visit (HOSPITAL_COMMUNITY): Payer: Self-pay

## 2022-05-27 ENCOUNTER — Ambulatory Visit (INDEPENDENT_AMBULATORY_CARE_PROVIDER_SITE_OTHER): Payer: Medicare Other

## 2022-05-27 ENCOUNTER — Other Ambulatory Visit (HOSPITAL_COMMUNITY): Payer: Self-pay

## 2022-05-27 DIAGNOSIS — I63132 Cerebral infarction due to embolism of left carotid artery: Secondary | ICD-10-CM

## 2022-05-29 LAB — CUP PACEART REMOTE DEVICE CHECK
Date Time Interrogation Session: 20230606004307
Implantable Pulse Generator Implant Date: 20200413

## 2022-06-02 ENCOUNTER — Other Ambulatory Visit: Payer: Self-pay | Admitting: Nurse Practitioner

## 2022-06-02 DIAGNOSIS — E559 Vitamin D deficiency, unspecified: Secondary | ICD-10-CM

## 2022-06-04 ENCOUNTER — Encounter: Payer: Self-pay | Admitting: Gastroenterology

## 2022-06-04 ENCOUNTER — Ambulatory Visit (INDEPENDENT_AMBULATORY_CARE_PROVIDER_SITE_OTHER): Payer: Medicare Other | Admitting: Gastroenterology

## 2022-06-04 VITALS — BP 118/72 | HR 82 | Ht 63.0 in | Wt 146.0 lb

## 2022-06-04 DIAGNOSIS — K222 Esophageal obstruction: Secondary | ICD-10-CM | POA: Diagnosis not present

## 2022-06-04 DIAGNOSIS — K449 Diaphragmatic hernia without obstruction or gangrene: Secondary | ICD-10-CM

## 2022-06-04 DIAGNOSIS — D122 Benign neoplasm of ascending colon: Secondary | ICD-10-CM | POA: Diagnosis not present

## 2022-06-04 DIAGNOSIS — R1319 Other dysphagia: Secondary | ICD-10-CM | POA: Diagnosis not present

## 2022-06-04 NOTE — Progress Notes (Signed)
[facility] Progress Note  Chief Complaint: Esophageal dysphagia  Subjective  History: Abigail Medina follows up for dysphagia after recent procedures.  She was initially seen in the office 03/06/2022 and underwent EGD/colonoscopy on May 17.  Colonoscopy was complete exam, good prep, diminutive tubular adenoma removed. EGD with following findings: - The larynx was normal except for discoloration on the right arytenoid and the hard palate from smoking. (see photos) The arytenoids were also asymmetric. Findings: - A 1-2 cm hiatal hernia was present. - A moderate to severe Schatzki ring was found at the gastroesophageal junction (adult EGD scope able to pass with minimal resistance - thus starting diameter 9-57m). A TTS dilator was passed through the scope. Dilation with a 13.5-14.5-15.5 mm balloon dilator was performed to 15.5 mm. The dilation site was examined and showed moderate mucosal disruption and moderate improvement in luminal narrowing. - Diffuse atrophic mucosa was found in the gastric antrum. Several biopsies were obtained on the greater curvature of the gastric body, on the lesser curvature of the gastric body, on the greater curvature of the gastric antrum and on the lesser curvature of the gastric antrum with cold forceps for histology. - Patchy mild mucosal changes characterized by erythema and scant flecks of heme were found in the gastric body. - The exam of the stomach was otherwise normal. - The cardia and gastric fundus were normal on retroflexion. - The examined duodenum was normal.  Gastric biopsies negative for H. Pylori ENT referral sent to Dr. DMelida Quitterfor laryngeal findings noted above ________________________________   LAlbin Fellingis glad to report her dysphagia has resolved.  She even tested it out by having some steak last week and had no trouble.  She is wearing her dentures, tries to chew things well and slow down her eating. Bowel habits are regular  without rectal bleeding. She was not experiencing pyrosis or regurgitation prior to EGD, no esophagitis was found.  She was prescribed pantoprazole 40 mg a day to take for the next 2 to 3 months, but she had not realized that is not picked up the prescription  ROS: Cardiovascular:  no chest pain Respiratory: no dyspnea  The patient's Past Medical, Family and Social History were reviewed and are on file in the EMR. Still smoking Objective:  Med list reviewed  Current Outpatient Medications:    albuterol (PROVENTIL HFA;VENTOLIN HFA) 108 (90 Base) MCG/ACT inhaler, Inhale 2 puffs into the lungs every 4 (four) hours as needed for wheezing or shortness of breath (cough, shortness of breath or wheezing.)., Disp: 1 Inhaler, Rfl: 6   amLODipine (NORVASC) 10 MG tablet, TAKE 1 TABLET BY MOUTH EVERY DAY, Disp: 90 tablet, Rfl: 0   atorvastatin (LIPITOR) 40 MG tablet, TAKE 1 TABLET BY MOUTH EVERY DAY, Disp: 90 tablet, Rfl: 0   benzonatate (TESSALON) 100 MG capsule, Take 1 capsule (100 mg total) by mouth 3 (three) times daily as needed for cough., Disp: 30 capsule, Rfl: 0   cetirizine (ZYRTEC) 10 MG tablet, Take 1 tablet (10 mg total) by mouth daily., Disp: 90 tablet, Rfl: 1   cimetidine (TAGAMET) 200 MG tablet, Take 1 tablet (200 mg total) by mouth 2 (two) times daily., Disp: 60 tablet, Rfl: 2   clopidogrel (PLAVIX) 75 MG tablet, Take 1 tablet (75 mg total) by mouth daily., Disp: 90 tablet, Rfl: 0   dolutegravir (TIVICAY) 50 MG tablet, Take 1 tablet (50 mg total) by mouth daily., Disp: 30 tablet, Rfl: 11   emtricitabine-tenofovir AF (DESCOVY)  200-25 MG tablet, Take 1 tablet by mouth daily., Disp: 30 tablet, Rfl: 11   fluticasone (FLONASE) 50 MCG/ACT nasal spray, SPRAY 2 SPRAYS INTO EACH NOSTRIL EVERY DAY, Disp: 16 mL, Rfl: 1   hydrochlorothiazide (HYDRODIURIL) 25 MG tablet, TAKE 1 TABLET (25 MG TOTAL) BY MOUTH DAILY., Disp: 90 tablet, Rfl: 0   pantoprazole (PROTONIX) 40 MG tablet, Take 1 tablet (40 mg  total) by mouth daily., Disp: 30 tablet, Rfl: 3   PARoxetine (PAXIL) 10 MG tablet, Take 1 tablet (10 mg total) by mouth daily., Disp: 90 tablet, Rfl: 0   traZODone (DESYREL) 50 MG tablet, TAKE 1 TABLET BY MOUTH EVERYDAY AT BEDTIME, Disp: 90 tablet, Rfl: 0   triamcinolone ointment (KENALOG) 0.5 %, Apply 1 application topically 2 (two) times daily., Disp: 30 g, Rfl: 2   Vitamin D, Ergocalciferol, (DRISDOL) 1.25 MG (50000 UNIT) CAPS capsule, TAKE 1 CAPSULE (50,000 UNITS TOTAL) BY MOUTH EVERY 7 (SEVEN) DAYS. WEEKLY, Disp: 12 capsule, Rfl: 0   Vital signs in last 24 hrs: Vitals:   06/04/22 0923  BP: 118/72  Pulse: 82  SpO2: 99%   Wt Readings from Last 3 Encounters:  06/04/22 146 lb (66.2 kg)  05/01/22 144 lb (65.3 kg)  03/11/22 142 lb 6.4 oz (64.6 kg)    Physical Exam  Looks well, no additional exam.  Entire visit spent in discussion of her condition and findings.  Labs:   ___________________________________________ Radiologic studies:   ____________________________________________ Other:   _____________________________________________ Assessment & Plan  Assessment: Encounter Diagnoses  Name Primary?   Esophageal dysphagia Yes   Schatzki's ring    Adenomatous polyp of ascending colon    Hiatal hernia    I showed her diagrams of the anatomy described hiatal hernia and esophageal dysphagia, Schatzki ring.  She had a good endoscopic and symptomatic result after balloon dilation.  I do feel she should take Protonix once daily for the next 2 months, and encouraged her to pick that up at the pharmacy  Make every effort to stop smoking.  Contact us if recurrence of dysphagia.  Be sure to slow down eating, cut and chew food well with something to drink after every few bites.  She should attend the ENT office consult as scheduled to evaluate the laryngeal findings on EGD.  The visualized discoloration on the arytenoids may be benign and related to smoking, but I would feel more  comfortable if ENT evaluated it.  She will otherwise see me as needed and contact me if there is recurrence of dysphagia she may need future EGD with endoscopic dilation.   20 minutes were spent on this encounter (including chart review, history/exam, counseling/coordination of care, and documentation) > 50% of that time was spent on counseling and coordination of care.  Nelida Meuse III

## 2022-06-04 NOTE — Patient Instructions (Signed)
If you are age 58 or older, your body mass index should be between 23-30. Your Body mass index is 25.86 kg/m. If this is out of the aforementioned range listed, please consider follow up with your Primary Care Provider.  If you are age 54 or younger, your body mass index should be between 19-25. Your Body mass index is 25.86 kg/m. If this is out of the aformentioned range listed, please consider follow up with your Primary Care Provider.   ________________________________________________________  The [facility] providers would like to encourage you to use [facility] to communicate with providers for non-urgent requests or questions.  Due to long hold times on the telephone, sending your provider a message by [facility] may be a faster and more efficient way to get a response.  Please allow 48 business hours for a response.  Please remember that this is for non-urgent requests.  _______________________________________________________  It was a pleasure to see you today!  Thank you for trusting me with your gastrointestinal care!

## 2022-06-10 ENCOUNTER — Ambulatory Visit: Payer: Medicare Other | Admitting: Nurse Practitioner

## 2022-06-12 ENCOUNTER — Encounter: Payer: Self-pay | Admitting: Nurse Practitioner

## 2022-06-12 ENCOUNTER — Ambulatory Visit (INDEPENDENT_AMBULATORY_CARE_PROVIDER_SITE_OTHER): Payer: Medicare Other | Admitting: Nurse Practitioner

## 2022-06-12 VITALS — BP 123/98 | HR 80 | Temp 97.5°F | Ht 63.0 in | Wt 145.6 lb

## 2022-06-12 DIAGNOSIS — I69959 Hemiplegia and hemiparesis following unspecified cerebrovascular disease affecting unspecified side: Secondary | ICD-10-CM

## 2022-06-12 DIAGNOSIS — M79642 Pain in left hand: Secondary | ICD-10-CM | POA: Diagnosis not present

## 2022-06-12 DIAGNOSIS — R531 Weakness: Secondary | ICD-10-CM | POA: Diagnosis not present

## 2022-06-12 DIAGNOSIS — I10 Essential (primary) hypertension: Secondary | ICD-10-CM

## 2022-06-12 NOTE — Patient Instructions (Signed)
You were seen today in the [facility] for reevaluation of HTN and other complaints. Labs were collected, results will be available via MyChart or, if abnormal, you will be contacted by clinic staff. You were prescribed medications, please take as directed. Please follow up in 6 mths for wellness visit

## 2022-06-12 NOTE — Progress Notes (Signed)
Abigail Medina, [place]  [address] Phone:  [phone]   Fax:  [phone] Subjective:   Patient ID: Abigail Medina, female    DOB: 01/21/1966, 56 y.o.   MRN: [id number]  Chief Complaint  Patient presents with   Follow-up    3 month follow up;Hypertension Patient states that she has been having pressure off and on in her ears that causes her to become unbalanced at times. Patient also states that she may need to se a neurologist due to the pain she has been having in her left hand off and on after her stroke.    HPI Abigail Medina 56 y.o. female  has a past medical history of HIV (human immunodeficiency virus infection) ([place]), Hypertension, Seizures ([place]), Stroke ([place]), and Vision abnormalities. To the [facility] for follow up on HTN.   Hypertension: Patient here for follow-up of elevated blood pressure. She is not exercising and is adherent to low salt diet.  Blood pressure is well controlled at home. Cardiac symptoms none. Patient denies chest pain, dyspnea, and near-syncope.  Cardiovascular risk factors: hypertension and sedentary lifestyle. Use of agents associated with hypertension: none. History of target organ damage: none. Checks B/P at home regularly, similar to today's values.   States that she has had intermittent pain in left hand x 2 wks, suspects its related to previous stroke. Requesting referral to neurology. Last visit with neurologist last year, when she suspected she may be having a stroke.   Also concerned about "suction feeling" in the bilateral ears, has been having symptoms for several months. States that at times it causes her to feel off balanced. Endorses some ringing in bilateral ears, but denies any pain or drainage from ears. Denies any other concerns today.   Denies any fatigue, chest pain, shortness of breath, HA or dizziness. Denies any blurred vision, numbness or tingling.   Past Medical History:  Diagnosis  Date   HIV (human immunodeficiency virus infection) ([place])    Hypertension    Seizures ([place])    Stroke ([place])    Vision abnormalities     Past Surgical History:  Procedure Laterality Date   LOOP RECORDER INSERTION N/A 03/29/2019   Procedure: LOOP RECORDER INSERTION;  Surgeon: Constance Haw, MD;  Location: [facility];  Service: Cardiovascular;  Laterality: N/A;    Family History  Problem Relation Age of Onset   Hypertension Mother    Colon cancer Neg Hx    Stomach cancer Neg Hx    Esophageal cancer Neg Hx     Social History   Socioeconomic History   Marital status: Single    Spouse name: Not on file   Number of children: Not on file   Years of education: Not on file   Highest education level: Not on file  Occupational History   Not on file  Tobacco Use   Smoking status: Every Day    Packs/day: 0.50    Types: Cigarettes    Start date: 10/17/2015    Last attempt to quit: 08/10/2019    Years since quitting: 2.8   Smokeless tobacco: Never   Tobacco comments:    picked up smoking after her husband died 03-14-21         1 to 3 cigs per day  Vaping Use   Vaping Use: Never used  Substance and Sexual Activity   Alcohol use: Yes    Alcohol/week: 60.0 standard drinks of alcohol  Types: 60 Standard drinks or equivalent per week    Comment: occ   Drug use: No   Sexual activity: Not Currently    Partners: Male    Birth control/protection: Condom    Comment: offered condoms  Other Topics Concern   Not on file  Social History Narrative   Not on file   Social Determinants of Health   Financial Resource Strain: Not on file  Food Insecurity: Not on file  Transportation Needs: Not on file  Physical Activity: Not on file  Stress: Not on file  Social Connections: Not on file  Intimate Partner Violence: Not on file    Outpatient Medications Prior to Visit  Medication Sig Dispense Refill   albuterol (PROVENTIL HFA;VENTOLIN HFA) 108 (90 Base) MCG/ACT inhaler  Inhale 2 puffs into the lungs every 4 (four) hours as needed for wheezing or shortness of breath (cough, shortness of breath or wheezing.). 1 Inhaler 6   amLODipine (NORVASC) 10 MG tablet TAKE 1 TABLET BY MOUTH EVERY DAY 90 tablet 0   atorvastatin (LIPITOR) 40 MG tablet TAKE 1 TABLET BY MOUTH EVERY DAY 90 tablet 0   cetirizine (ZYRTEC) 10 MG tablet Take 1 tablet (10 mg total) by mouth daily. 90 tablet 1   cimetidine (TAGAMET) 200 MG tablet Take 1 tablet (200 mg total) by mouth 2 (two) times daily. 60 tablet 2   clopidogrel (PLAVIX) 75 MG tablet Take 1 tablet (75 mg total) by mouth daily. 90 tablet 0   dolutegravir (TIVICAY) 50 MG tablet Take 1 tablet (50 mg total) by mouth daily. 30 tablet 11   emtricitabine-tenofovir AF (DESCOVY) 200-25 MG tablet Take 1 tablet by mouth daily. 30 tablet 11   fluticasone (FLONASE) 50 MCG/ACT nasal spray SPRAY 2 SPRAYS INTO EACH NOSTRIL EVERY DAY 16 mL 1   hydrochlorothiazide (HYDRODIURIL) 25 MG tablet TAKE 1 TABLET (25 MG TOTAL) BY MOUTH DAILY. 90 tablet 0   pantoprazole (PROTONIX) 40 MG tablet Take 1 tablet (40 mg total) by mouth daily. 30 tablet 3   PARoxetine (PAXIL) 10 MG tablet Take 1 tablet (10 mg total) by mouth daily. 90 tablet 0   traZODone (DESYREL) 50 MG tablet TAKE 1 TABLET BY MOUTH EVERYDAY AT BEDTIME 90 tablet 0   triamcinolone ointment (KENALOG) 0.5 % Apply 1 application topically 2 (two) times daily. 30 g 2   Vitamin D, Ergocalciferol, (DRISDOL) 1.25 MG (50000 UNIT) CAPS capsule TAKE 1 CAPSULE (50,000 UNITS TOTAL) BY MOUTH EVERY 7 (SEVEN) DAYS. WEEKLY 12 capsule 0   benzonatate (TESSALON) 100 MG capsule Take 1 capsule (100 mg total) by mouth 3 (three) times daily as needed for cough. (Patient not taking: Reported on 06/12/2022) 30 capsule 0   No facility-administered medications prior to visit.    Allergies  Allergen Reactions   Lisinopril Swelling    Review of Systems  Constitutional:  Negative for chills, fever and malaise/fatigue.  HENT:   Positive for tinnitus. Negative for congestion, ear discharge, ear pain, hearing loss, nosebleeds, sinus pain and sore throat.        See HPI  Eyes: Negative.   Respiratory:  Negative for cough, shortness of breath and stridor.   Cardiovascular:  Negative for chest pain, palpitations and leg swelling.  Gastrointestinal:  Negative for abdominal pain, blood in stool, constipation, diarrhea, nausea and vomiting.  Musculoskeletal:  Negative for back pain, falls, joint pain, myalgias and neck pain.       See HPI  Skin: Negative.   Neurological: Negative.  Psychiatric/Behavioral:  Negative for depression. The patient is not nervous/anxious.   All other systems reviewed and are negative.      Objective:    Physical Exam Constitutional:      General: She is not in acute distress.    Appearance: Normal appearance.  HENT:     Head: Normocephalic.     Right Ear: External ear normal. There is impacted cerumen.     Left Ear: Tympanic membrane, ear canal and external ear normal. There is no impacted cerumen.     Ears:     Comments: Right ear exam wnl s/p irrigation     Nose: Nose normal. No congestion or rhinorrhea.     Mouth/Throat:     Mouth: Mucous membranes are moist.     Pharynx: Oropharynx is clear. No oropharyngeal exudate or posterior oropharyngeal erythema.  Eyes:     General: No scleral icterus.       Right eye: No discharge.        Left eye: No discharge.     Extraocular Movements: Extraocular movements intact.     Conjunctiva/sclera: Conjunctivae normal.     Pupils: Pupils are equal, round, and reactive to light.  Neck:     Vascular: No carotid bruit.  Cardiovascular:     Rate and Rhythm: Normal rate and regular rhythm.     Pulses: Normal pulses.     Heart sounds: Normal heart sounds.     Comments: No obvious peripheral edema Pulmonary:     Effort: Pulmonary effort is normal.     Breath sounds: Normal breath sounds.  Musculoskeletal:        General: No swelling,  tenderness, deformity or signs of injury. Normal range of motion.     Cervical back: Normal range of motion and neck supple. No rigidity or tenderness.     Right lower leg: No edema.     Left lower leg: No edema.  Lymphadenopathy:     Cervical: No cervical adenopathy.  Skin:    General: Skin is warm and dry.     Capillary Refill: Capillary refill takes less than 2 seconds.  Neurological:     General: No focal deficit present.     Mental Status: She is alert and oriented to person, place, and time.  Psychiatric:        Mood and Affect: Mood normal.        Behavior: Behavior normal.        Thought Content: Thought content normal.        Judgment: Judgment normal.     BP (!) 123/98 Comment: pt has not taken b/p medication this morning.  Pulse 80   Temp (!) 97.5 F (36.4 C)   Ht '5\' 3"'$  (1.6 m)   Wt 145 lb 9.6 oz (66 kg)   LMP 08/04/2014   SpO2 100%   BMI 25.79 kg/m  Wt Readings from Last 3 Encounters:  06/12/22 145 lb 9.6 oz (66 kg)  06/04/22 146 lb (66.2 kg)  05/01/22 144 lb (65.3 kg)    Immunization History  Administered Date(s) Administered   Hepatitis B 01/19/2009, 04/18/2011, 12/03/2011   Influenza Split 10/08/2011, 10/13/2012   Influenza Whole 12/22/2008, 12/29/2009, 08/29/2010   Influenza,inj,Quad PF,6+ Mos 11/30/2013, 10/10/2014, 10/03/2015, 01/23/2017, 10/17/2019, 10/18/2020, 11/26/2021   Pneumococcal Conjugate-13 12/16/2009   Pneumococcal Polysaccharide-23 03/12/2007, 04/09/2012   Tdap 08/07/2016    Diabetic Foot Exam - Simple   No data filed     Lab Results  Component Value Date  TSH 3.152 03/28/2019   Lab Results  Component Value Date   WBC 3.2 (L) 09/08/2021   HGB 14.5 09/08/2021   HCT 41.7 09/08/2021   MCV 91.9 09/08/2021   PLT 154 09/08/2021   Lab Results  Component Value Date   NA 137 09/08/2021   K 3.5 09/08/2021   CO2 26 09/08/2021   GLUCOSE 96 09/08/2021   BUN 10 09/08/2021   CREATININE 0.86 09/08/2021   BILITOT 1.5 (H) 09/08/2021    ALKPHOS 62 09/08/2021   AST 27 09/08/2021   ALT 37 09/08/2021   PROT 6.7 09/08/2021   ALBUMIN 3.3 (L) 09/08/2021   CALCIUM 8.9 09/08/2021   ANIONGAP 7 09/08/2021   Lab Results  Component Value Date   CHOL 148 09/08/2021   CHOL 167 04/17/2021   CHOL 146 08/09/2020   Lab Results  Component Value Date   HDL 65 09/08/2021   HDL 67 04/17/2021   HDL 63 08/09/2020   Lab Results  Component Value Date   LDLCALC 71 09/08/2021   LDLCALC 83 04/17/2021   LDLCALC 69 08/09/2020   Lab Results  Component Value Date   TRIG 59 09/08/2021   TRIG 79 04/17/2021   TRIG 72 08/09/2020   Lab Results  Component Value Date   CHOLHDL 2.3 09/08/2021   CHOLHDL 2.5 04/17/2021   CHOLHDL 2.3 08/09/2020   Lab Results  Component Value Date   HGBA1C 5.4 09/07/2021   HGBA1C 5.2 03/28/2019   HGBA1C  01/18/2011    5.1 (NOTE)                                                                       According to the ADA Clinical Practice Recommendations for 2011, when HbA1c is used as a screening test:   >=6.5%   Diagnostic of Diabetes Mellitus           (if abnormal result  is confirmed)  5.7-6.4%   Increased risk of developing Diabetes Mellitus  References:Diagnosis and Classification of Diabetes Mellitus,Diabetes NWGN,5621,30(QMVHQ 1):S62-S69 and Standards of Medical Care in         Diabetes - 2011,Diabetes Care,2011,34  (Suppl 1):S11-S61.       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[place])   Relevant Orders   Ambulatory referral to Neurology   Left-sided weakness   Relevant Orders   Ambulatory referral to Neurology    Other Visit Diagnoses     Left hand pain       Relevant Orders   Ambulatory referral to Neurology, referral completed  Discussed possible causes Discussed non pharmacological methods for management of symptoms Informed to take OTC medications as needed    Follow up in 6 mths for wellness visit, sooner as needed     I am having Abigail Medina maintain her albuterol, fluticasone, triamcinolone ointment, Tivicay, Descovy, clopidogrel, PARoxetine, cimetidine, benzonatate, pantoprazole,  atorvastatin, amLODipine, hydrochlorothiazide, traZODone, cetirizine, and Vitamin D (Ergocalciferol).  No orders of the defined types were placed in this encounter.    Teena Dunk, NP

## 2022-06-12 NOTE — Progress Notes (Signed)
Carelink Summary Report / Loop Recorder 

## 2022-06-13 ENCOUNTER — Other Ambulatory Visit (HOSPITAL_COMMUNITY): Payer: Self-pay

## 2022-06-20 ENCOUNTER — Other Ambulatory Visit (HOSPITAL_COMMUNITY): Payer: Self-pay

## 2022-06-27 LAB — CUP PACEART REMOTE DEVICE CHECK
Date Time Interrogation Session: 20230709004410
Implantable Pulse Generator Implant Date: 20200413

## 2022-06-29 ENCOUNTER — Other Ambulatory Visit: Payer: Self-pay | Admitting: Nurse Practitioner

## 2022-07-01 ENCOUNTER — Ambulatory Visit (INDEPENDENT_AMBULATORY_CARE_PROVIDER_SITE_OTHER): Payer: Medicare Other

## 2022-07-01 DIAGNOSIS — I63132 Cerebral infarction due to embolism of left carotid artery: Secondary | ICD-10-CM

## 2022-07-05 ENCOUNTER — Telehealth: Payer: Self-pay

## 2022-07-05 NOTE — Telephone Encounter (Signed)
Muscle Relaxer refill

## 2022-07-09 ENCOUNTER — Encounter: Payer: Self-pay | Admitting: Neurology

## 2022-07-09 ENCOUNTER — Ambulatory Visit (INDEPENDENT_AMBULATORY_CARE_PROVIDER_SITE_OTHER): Payer: Medicare Other | Admitting: Neurology

## 2022-07-09 VITALS — BP 113/82 | HR 80 | Ht 63.0 in | Wt 145.6 lb

## 2022-07-09 DIAGNOSIS — R531 Weakness: Secondary | ICD-10-CM | POA: Diagnosis not present

## 2022-07-09 DIAGNOSIS — I6330 Cerebral infarction due to thrombosis of unspecified cerebral artery: Secondary | ICD-10-CM

## 2022-07-09 DIAGNOSIS — R200 Anesthesia of skin: Secondary | ICD-10-CM

## 2022-07-09 DIAGNOSIS — G5622 Lesion of ulnar nerve, left upper limb: Secondary | ICD-10-CM

## 2022-07-09 DIAGNOSIS — I63132 Cerebral infarction due to embolism of left carotid artery: Secondary | ICD-10-CM | POA: Diagnosis not present

## 2022-07-09 DIAGNOSIS — B20 Human immunodeficiency virus [HIV] disease: Secondary | ICD-10-CM

## 2022-07-09 NOTE — Progress Notes (Signed)
[facility]  PATIENT: Abigail Medina DOB: 1966/11/11  REFERRING DOCTOR OR PCP: Bo Merino, NP SOURCE: Patient, notes from primary care, notes from hospitalizations, imaging and lab reports, MRI images personally reviewed.  _________________________________   HISTORICAL  CHIEF COMPLAINT:  Chief Complaint  Patient presents with   New Patient (Initial Visit)    RM 14, alone. CVA hx. Last stroke 08/2021. Having numbness/tingling in left hand. Has pain in hand if she leans on hand. Legs feel heavy sometimes while walking. She fell about a month ago. Chair slipped out from under her.     HISTORY OF PRESENT ILLNESS:  I had the pleasure of seeing patient, Abigail Medina, at [facility] for neurologic consultation regarding her strokes.  In February 2012, she presented with left sided weakness and numbness. The weakness was much worse in the hand and the face than in the leg. Numbness was on the left side. Her right side had no weakness or numbness at the time. She had minimal recovery after the stroke and continued to have numbness and weakness in the left hand and face.  Additionally, gait was reduced.   In April 2020, she had facial numbness and presented to the ED and was found to have an acute left posterior frontal CVA.   That stroke did not affect the arms or legs.  Most recent MRI of the brain from 09/06/2021 shows multiple chronic infarctions in the right cerebellar hemisphere, and in the bilateral parietal and frontal lobes.  Currently, she notes gait is off due to reduced balance.   Turns are off balanced.   She can walk without an aid.   She has no recent falls.  She continues to have left sided weakness and soreness combined with numbness.  Her weakness and numbness is owrse in he left hand than the leg.  Her right side is fine.     Compared to last year, she notes more numbness in the left hand. This involves all fingers.  Of note, a  nerve conduction study in the past had shown ulnar neuropathy bilaterally, worse on the left.   She has HIV (diagnosed 2000).   She is on Tivicay and Descovy   Viral load is zero.  CD4 is normal.     Vascular risks: Hypertension, aneurysm of thoracic aorta, hyperlipidemia.  Current stroke prophylaxis: Clopidogrel 75 mg daily.  IMAGING: The CT scan of the head 06/01/2011 showed chronic right frontal wedge-shaped stroke and a small wedge-shaped right cerebellar hemisphere stroke. The MRI from 01/25/2011 showed that the cerebellar stroke was chronic at that time and the right frontal stroke was acute at that time.     MRI of the cervical spine 08/28/11 just showed minimal DDD and the spinal cord was normal.   Laboratory tests and records from her 2012 hospital stay were also evaluated.  MRI 03/28/2019 showed acute posterior left frontal lobe stroke and old strokes bilateral frontal and parietal lobes and right cerebellum.     MRI of the brain 09/06/2021 showed chronic infarcts and encephalomalacia in the bilateral MCA territories, including perirolandic cortex on the right, parietal lobe and anterior frontal lobe on the left. Chronic infarct in the right cerebellum.  Nothing acute and no new strokes compared to t2020  MRA neck/head 09/06/2021 were negative  OTHER STUDIES On 04/15/2012, she had a nerve conduction study of both arms. 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 The median nerves were normal. She did not have EMG testing  at that time.   A year earlier, she had nerve conduction and EMG testing left arm and that test showed left ulnar neuropathy. Limited EMG did not show radiculopathy.      REVIEW OF SYSTEMS: Constitutional: No fevers, chills, sweats, or change in appetite Eyes: No visual changes, double vision, eye pain Ear, nose and throat: No hearing loss, ear pain, nasal congestion, sore throat Cardiovascular: No chest pain, palpitations Respiratory:  No shortness of breath  at rest or with exertion.   No wheezes GastrointestinaI: No nausea, vomiting, diarrhea, abdominal pain, fecal incontinence Genitourinary:  No dysuria, urinary retention or frequency.  No nocturia. Musculoskeletal:  No neck pain, back pain Integumentary: No rash, pruritus, skin lesions Neurological: as above Psychiatric: No depression at this time.  No anxiety Endocrine: No palpitations, diaphoresis, change in appetite, change in weigh or increased thirst Hematologic/Lymphatic:  No anemia, purpura, petechiae. Allergic/Immunologic: No itchy/runny eyes, nasal congestion, recent allergic reactions, rashes  ALLERGIES: Allergies  Allergen Reactions   Lisinopril Swelling    HOME MEDICATIONS:  Current Outpatient Medications:    albuterol (PROVENTIL HFA;VENTOLIN HFA) 108 (90 Base) MCG/ACT inhaler, Inhale 2 puffs into the lungs every 4 (four) hours as needed for wheezing or shortness of breath (cough, shortness of breath or wheezing.)., Disp: 1 Inhaler, Rfl: 6   amLODipine (NORVASC) 10 MG tablet, TAKE 1 TABLET BY MOUTH EVERY DAY, Disp: 90 tablet, Rfl: 0   atorvastatin (LIPITOR) 40 MG tablet, TAKE 1 TABLET BY MOUTH EVERY DAY, Disp: 90 tablet, Rfl: 0   benzonatate (TESSALON) 100 MG capsule, Take 1 capsule (100 mg total) by mouth 3 (three) times daily as needed for cough., Disp: 30 capsule, Rfl: 0   cetirizine (ZYRTEC) 10 MG tablet, Take 1 tablet (10 mg total) by mouth daily., Disp: 90 tablet, Rfl: 1   clopidogrel (PLAVIX) 75 MG tablet, TAKE 1 TABLET BY MOUTH EVERY DAY, Disp: 90 tablet, Rfl: 0   dolutegravir (TIVICAY) 50 MG tablet, Take 1 tablet (50 mg total) by mouth daily., Disp: 30 tablet, Rfl: 11   emtricitabine-tenofovir AF (DESCOVY) 200-25 MG tablet, Take 1 tablet by mouth daily., Disp: 30 tablet, Rfl: 11   fluticasone (FLONASE) 50 MCG/ACT nasal spray, SPRAY 2 SPRAYS INTO EACH NOSTRIL EVERY DAY, Disp: 16 mL, Rfl: 1   hydrochlorothiazide (HYDRODIURIL) 25 MG tablet, TAKE 1 TABLET (25 MG TOTAL) BY  MOUTH DAILY., Disp: 90 tablet, Rfl: 0   pantoprazole (PROTONIX) 40 MG tablet, Take 1 tablet (40 mg total) by mouth daily., Disp: 30 tablet, Rfl: 3   PARoxetine (PAXIL) 10 MG tablet, Take 1 tablet (10 mg total) by mouth daily., Disp: 90 tablet, Rfl: 0   traZODone (DESYREL) 50 MG tablet, TAKE 1 TABLET BY MOUTH EVERYDAY AT BEDTIME, Disp: 90 tablet, Rfl: 0   triamcinolone ointment (KENALOG) 0.5 %, Apply 1 application topically 2 (two) times daily., Disp: 30 g, Rfl: 2   Vitamin D, Ergocalciferol, (DRISDOL) 1.25 MG (50000 UNIT) CAPS capsule, TAKE 1 CAPSULE (50,000 UNITS TOTAL) BY MOUTH EVERY 7 (SEVEN) DAYS. WEEKLY, Disp: 12 capsule, Rfl: 0   cimetidine (TAGAMET) 200 MG tablet, Take 1 tablet (200 mg total) by mouth 2 (two) times daily., Disp: 60 tablet, Rfl: 2  PAST MEDICAL HISTORY: Past Medical History:  Diagnosis Date   HIV (human immunodeficiency virus infection) ([place])    Hypertension    Seizures ([place])    Stroke ([place])    Vision abnormalities     PAST SURGICAL HISTORY: Past Surgical History:  Procedure Laterality Date  LOOP RECORDER INSERTION N/A 03/29/2019   Procedure: LOOP RECORDER INSERTION;  Surgeon: Constance Haw, MD;  Location: [facility];  Service: Cardiovascular;  Laterality: N/A;    FAMILY HISTORY: Family History  Problem Relation Age of Onset   Hypertension Mother    Colon cancer Neg Hx    Stomach cancer Neg Hx    Esophageal cancer Neg Hx     SOCIAL HISTORY:  Social History   Socioeconomic History   Marital status: Single    Spouse name: Not on file   Number of children: Not on file   Years of education: Not on file   Highest education level: Not on file  Occupational History   Not on file  Tobacco Use   Smoking status: Every Day    Packs/day: 0.50    Types: Cigarettes    Start date: 10/17/2015    Last attempt to quit: 08/10/2019    Years since quitting: 2.9   Smokeless tobacco: Never   Tobacco comments:    picked up smoking after her husband  died 10-Mar-2021         1 to 3 cigs per day  Vaping Use   Vaping Use: Never used  Substance and Sexual Activity   Alcohol use: Yes    Alcohol/week: 60.0 standard drinks of alcohol    Types: 60 Standard drinks or equivalent per week    Comment: Drinks on weekends (3 drinks)   Drug use: No   Sexual activity: Not Currently    Partners: Male    Birth control/protection: Condom    Comment: offered condoms  Other Topics Concern   Not on file  Social History Narrative   Right handed   Caffeine use: no coffee, tea or soda   Social Determinants of Health   Financial Resource Strain: Not on file  Food Insecurity: Not on file  Transportation Needs: Not on file  Physical Activity: Not on file  Stress: Not on file  Social Connections: Not on file  Intimate Partner Violence: Not on file     PHYSICAL EXAM  Vitals:   07/09/22 1133  BP: 113/82  Pulse: 80  Weight: 145 lb 9.6 oz (66 kg)  Height: '5\' 3"'$  (1.6 m)    Body mass index is 25.79 kg/m.   General: The patient is well-developed and well-nourished and in no acute distress  HEENT:  Head is [place]/AT.  Sclera are anicteric.  Neck: No carotid bruits are noted.  The neck is nontender.  Cardiovascular: The heart has a regular rate and rhythm with a normal S1 and S2. There were no murmurs, gallops or rubs.    Skin: Extremities are without rash or  edema.  Musculoskeletal:  Back is nontender  Neurologic Exam  Mental status: The patient is alert and oriented x 3 at the time of the examination. The patient has apparent normal recent and remote memory, with an apparently normal attention span and concentration ability.   Speech is normal.  Cranial nerves: Extraocular movements are full. Pupils are equal, round, and reactive to light and accomodation.  Visual fields are full.  Facial symmetry is present. There is good facial sensation to soft touch bilaterally.Facial strength is normal.  Trapezius and sternocleidomastoid strength is  normal. No dysarthria is noted.  The tongue is midline, and the patient has symmetric elevation of the soft palate. No obvious hearing deficits are noted.  Motor:  Muscle bulk is normal.   Tone is normal. Strength is  4-  to 4/ 5 in left intrinsic hand muscles and 4+/5 wrist muscles and 5/5 elsewhere in  4 extremities.   Sensory: Sensory testing is reduced in left arm and leg to touch and vibration and furhter reduced in left hand to pinprick, soft touch and vibration sensation.   Positive left ulnar and median Tinel's sign.  None on the right.  Coordination: Cerebellar testing reveals good finger-nose-finger and heel-to-shin bilaterally.  Gait and station: Station is normal.   Gait is near normal. Tandem gait is wide. Romberg is negative.   Reflexes: Deep tendon reflexes are symmetric and 3  bilaterally.   Plantar responses are flexor.    DIAGNOSTIC DATA (LABS, IMAGING, TESTING) - I reviewed patient records, labs, notes, testing and imaging myself where available.  Lab Results  Component Value Date   WBC 3.2 (L) 09/08/2021   HGB 14.5 09/08/2021   HCT 41.7 09/08/2021   MCV 91.9 09/08/2021   PLT 154 09/08/2021      Component Value Date/Time   NA 137 09/08/2021 0623   NA 141 09/26/2020 1000   K 3.5 09/08/2021 0623   CL 104 09/08/2021 0623   CO2 26 09/08/2021 0623   GLUCOSE 96 09/08/2021 0623   BUN 10 09/08/2021 0623   BUN 13 09/26/2020 1000   CREATININE 0.86 09/08/2021 0623   CREATININE 0.79 04/17/2021 1146   CALCIUM 8.9 09/08/2021 0623   PROT 6.7 09/08/2021 0623   PROT 6.4 09/26/2020 1000   ALBUMIN 3.3 (L) 09/08/2021 0623   ALBUMIN 4.0 09/26/2020 1000   AST 27 09/08/2021 0623   ALT 37 09/08/2021 0623   ALKPHOS 62 09/08/2021 0623   BILITOT 1.5 (H) 09/08/2021 0623   BILITOT 0.9 09/26/2020 1000   GFRNONAA >60 09/08/2021 0623   GFRNONAA 81 10/18/2020 1215   GFRAA 94 10/18/2020 1215   Lab Results  Component Value Date   CHOL 148 09/08/2021   HDL 65 09/08/2021   LDLCALC  71 09/08/2021   TRIG 59 09/08/2021   CHOLHDL 2.3 09/08/2021   Lab Results  Component Value Date   HGBA1C 5.4 09/07/2021   Lab Results  Component Value Date   VITAMINB12 1,086 (H) 09/08/2021   Lab Results  Component Value Date   TSH 3.152 03/28/2019       ASSESSMENT AND PLAN  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  She is on Plavix and tolerates it well.  NCV/EMG of the left arm to further evaluate the numbness.  She appears to have an ulnar neuropathy and possibly median neuropathy.  Additionally, the strokes have affected her left hand. Continue Plavix and other medications Rtc 6 months  Braylon Grenda A. Felecia Shelling, MD, [facility] Shave 08/08/2352, 6:14 PM Certified in Neurology, Clinical Neurophysiology, Sleep Medicine and Neuroimaging  [facility] [address], [place], [place] [address] [phone]

## 2022-07-10 NOTE — Telephone Encounter (Signed)
Review of patient medications, there are no muscle relaxers on the patients medication list.   LVM for patient to call back with the medication name that she would like to be refilled.   May need an appt to evaluate for need.

## 2022-07-12 ENCOUNTER — Other Ambulatory Visit (HOSPITAL_COMMUNITY): Payer: Self-pay

## 2022-07-18 ENCOUNTER — Other Ambulatory Visit (HOSPITAL_COMMUNITY): Payer: Self-pay

## 2022-07-24 ENCOUNTER — Telehealth: Payer: Self-pay | Admitting: Neurology

## 2022-07-24 NOTE — Telephone Encounter (Signed)
Order for NCV/EMG sent to [facility] [phone].

## 2022-07-31 ENCOUNTER — Other Ambulatory Visit (HOSPITAL_COMMUNITY): Payer: Self-pay

## 2022-08-01 NOTE — Progress Notes (Signed)
Carelink Summary Report / Loop Recorder 

## 2022-08-02 ENCOUNTER — Other Ambulatory Visit: Payer: Self-pay | Admitting: Nurse Practitioner

## 2022-08-03 ENCOUNTER — Other Ambulatory Visit: Payer: Self-pay | Admitting: Gastroenterology

## 2022-08-03 DIAGNOSIS — R1319 Other dysphagia: Secondary | ICD-10-CM

## 2022-08-05 ENCOUNTER — Ambulatory Visit (INDEPENDENT_AMBULATORY_CARE_PROVIDER_SITE_OTHER): Payer: Medicare Other

## 2022-08-05 DIAGNOSIS — I63132 Cerebral infarction due to embolism of left carotid artery: Secondary | ICD-10-CM

## 2022-08-06 LAB — CUP PACEART REMOTE DEVICE CHECK
Date Time Interrogation Session: 20230820232633
Implantable Pulse Generator Implant Date: 20200413

## 2022-08-12 NOTE — Telephone Encounter (Signed)
Received fax from [facility] Laurelyn Sickle, "Tried several times to contact patient with no success" [phone]

## 2022-08-15 ENCOUNTER — Other Ambulatory Visit: Payer: Self-pay | Admitting: Internal Medicine

## 2022-08-15 ENCOUNTER — Ambulatory Visit: Payer: Self-pay

## 2022-08-15 ENCOUNTER — Ambulatory Visit (HOSPITAL_COMMUNITY): Payer: Medicare Other

## 2022-08-15 ENCOUNTER — Other Ambulatory Visit (HOSPITAL_COMMUNITY): Payer: Self-pay | Admitting: Nurse Practitioner

## 2022-08-15 ENCOUNTER — Ambulatory Visit (HOSPITAL_COMMUNITY)
Admission: RE | Admit: 2022-08-15 | Discharge: 2022-08-15 | Disposition: A | Discharge status: Home / Self Care | Payer: Medicare Other | Source: Ambulatory Visit | Attending: Nurse Practitioner | Admitting: Nurse Practitioner

## 2022-08-15 DIAGNOSIS — M19071 Primary osteoarthritis, right ankle and foot: Secondary | ICD-10-CM | POA: Diagnosis not present

## 2022-08-15 DIAGNOSIS — M25571 Pain in right ankle and joints of right foot: Secondary | ICD-10-CM

## 2022-08-15 DIAGNOSIS — I10 Essential (primary) hypertension: Secondary | ICD-10-CM

## 2022-08-15 DIAGNOSIS — M79661 Pain in right lower leg: Secondary | ICD-10-CM | POA: Diagnosis not present

## 2022-08-15 DIAGNOSIS — M2011 Hallux valgus (acquired), right foot: Secondary | ICD-10-CM | POA: Diagnosis not present

## 2022-08-15 DIAGNOSIS — M79604 Pain in right leg: Secondary | ICD-10-CM | POA: Insufficient documentation

## 2022-08-15 DIAGNOSIS — M79674 Pain in right toe(s): Secondary | ICD-10-CM | POA: Diagnosis not present

## 2022-08-16 ENCOUNTER — Other Ambulatory Visit (HOSPITAL_COMMUNITY): Payer: Self-pay | Admitting: Nurse Practitioner

## 2022-08-16 ENCOUNTER — Ambulatory Visit (HOSPITAL_COMMUNITY)
Admission: RE | Admit: 2022-08-16 | Discharge: 2022-08-16 | Disposition: A | Discharge status: Home / Self Care | Payer: Medicare Other | Source: Ambulatory Visit | Attending: Nurse Practitioner | Admitting: Nurse Practitioner

## 2022-08-16 DIAGNOSIS — R296 Repeated falls: Secondary | ICD-10-CM | POA: Diagnosis not present

## 2022-08-16 DIAGNOSIS — R609 Edema, unspecified: Secondary | ICD-10-CM | POA: Diagnosis not present

## 2022-08-16 DIAGNOSIS — S8991XA Unspecified injury of right lower leg, initial encounter: Secondary | ICD-10-CM | POA: Diagnosis not present

## 2022-08-16 DIAGNOSIS — M25561 Pain in right knee: Secondary | ICD-10-CM | POA: Diagnosis not present

## 2022-08-26 ENCOUNTER — Ambulatory Visit: Payer: Medicare Other | Admitting: Neurology

## 2022-08-26 ENCOUNTER — Other Ambulatory Visit (HOSPITAL_COMMUNITY): Payer: Self-pay

## 2022-09-02 ENCOUNTER — Other Ambulatory Visit (HOSPITAL_COMMUNITY): Payer: Self-pay

## 2022-09-02 NOTE — Progress Notes (Signed)
Carelink Summary Report / Loop Recorder 

## 2022-09-03 ENCOUNTER — Other Ambulatory Visit: Payer: Self-pay | Admitting: Nurse Practitioner

## 2022-09-08 ENCOUNTER — Other Ambulatory Visit: Payer: Self-pay | Admitting: Nurse Practitioner

## 2022-09-09 ENCOUNTER — Ambulatory Visit (INDEPENDENT_AMBULATORY_CARE_PROVIDER_SITE_OTHER): Payer: Medicare Other

## 2022-09-09 DIAGNOSIS — I63132 Cerebral infarction due to embolism of left carotid artery: Secondary | ICD-10-CM | POA: Diagnosis not present

## 2022-09-10 LAB — CUP PACEART REMOTE DEVICE CHECK
Date Time Interrogation Session: 20230922231806
Implantable Pulse Generator Implant Date: 20200413

## 2022-09-12 ENCOUNTER — Other Ambulatory Visit: Payer: Self-pay | Admitting: Nurse Practitioner

## 2022-09-12 DIAGNOSIS — I10 Essential (primary) hypertension: Secondary | ICD-10-CM

## 2022-09-24 ENCOUNTER — Other Ambulatory Visit (HOSPITAL_COMMUNITY): Payer: Self-pay

## 2022-09-24 NOTE — Progress Notes (Signed)
Carelink Summary Report / Loop Recorder 

## 2022-09-25 ENCOUNTER — Telehealth: Payer: Self-pay

## 2022-09-25 NOTE — Telephone Encounter (Signed)
Spoke with patient informed her her loop recorder has has reached [facility], patient stated she did not know if she would like to leave the loop in and have it taken patient stated she would think about it and call back.

## 2022-10-01 ENCOUNTER — Other Ambulatory Visit (HOSPITAL_COMMUNITY): Payer: Self-pay

## 2022-10-22 ENCOUNTER — Other Ambulatory Visit (HOSPITAL_COMMUNITY): Payer: Self-pay

## 2022-10-22 ENCOUNTER — Other Ambulatory Visit: Payer: Self-pay | Admitting: Nurse Practitioner

## 2022-10-24 ENCOUNTER — Other Ambulatory Visit (HOSPITAL_COMMUNITY): Payer: Self-pay

## 2022-10-24 DIAGNOSIS — Z139 Encounter for screening, unspecified: Secondary | ICD-10-CM | POA: Diagnosis not present

## 2022-10-24 DIAGNOSIS — I10 Essential (primary) hypertension: Secondary | ICD-10-CM | POA: Diagnosis not present

## 2022-10-24 DIAGNOSIS — E785 Hyperlipidemia, unspecified: Secondary | ICD-10-CM | POA: Diagnosis not present

## 2022-10-24 DIAGNOSIS — Z13220 Encounter for screening for lipoid disorders: Secondary | ICD-10-CM | POA: Diagnosis not present

## 2022-10-24 DIAGNOSIS — Z823 Family history of stroke: Secondary | ICD-10-CM | POA: Diagnosis not present

## 2022-11-04 ENCOUNTER — Other Ambulatory Visit (HOSPITAL_COMMUNITY): Payer: Self-pay

## 2022-11-04 DIAGNOSIS — K219 Gastro-esophageal reflux disease without esophagitis: Secondary | ICD-10-CM | POA: Diagnosis not present

## 2022-11-04 DIAGNOSIS — Z8673 Personal history of transient ischemic attack (TIA), and cerebral infarction without residual deficits: Secondary | ICD-10-CM | POA: Diagnosis not present

## 2022-11-04 DIAGNOSIS — E7849 Other hyperlipidemia: Secondary | ICD-10-CM | POA: Diagnosis not present

## 2022-11-04 DIAGNOSIS — Z7901 Long term (current) use of anticoagulants: Secondary | ICD-10-CM | POA: Diagnosis not present

## 2022-11-04 DIAGNOSIS — I10 Essential (primary) hypertension: Secondary | ICD-10-CM | POA: Diagnosis not present

## 2022-11-15 ENCOUNTER — Other Ambulatory Visit: Payer: Self-pay | Admitting: Internal Medicine

## 2022-11-15 DIAGNOSIS — G629 Polyneuropathy, unspecified: Secondary | ICD-10-CM

## 2022-11-15 NOTE — Telephone Encounter (Signed)
Please advise on refill.

## 2022-11-19 NOTE — Telephone Encounter (Signed)
Routing medication to PCP to take over maintenance medication

## 2022-11-20 ENCOUNTER — Other Ambulatory Visit: Payer: Self-pay | Admitting: Nurse Practitioner

## 2022-11-28 ENCOUNTER — Encounter: Payer: Self-pay | Admitting: Infectious Diseases

## 2022-11-28 ENCOUNTER — Ambulatory Visit (INDEPENDENT_AMBULATORY_CARE_PROVIDER_SITE_OTHER): Payer: Medicare Other | Admitting: Infectious Diseases

## 2022-11-28 ENCOUNTER — Other Ambulatory Visit: Payer: Self-pay | Admitting: Internal Medicine

## 2022-11-28 ENCOUNTER — Other Ambulatory Visit: Payer: Self-pay

## 2022-11-28 ENCOUNTER — Other Ambulatory Visit (HOSPITAL_COMMUNITY): Payer: Self-pay

## 2022-11-28 VITALS — BP 110/81 | HR 78 | Resp 16 | Ht 63.0 in | Wt 138.3 lb

## 2022-11-28 DIAGNOSIS — B20 Human immunodeficiency virus [HIV] disease: Secondary | ICD-10-CM | POA: Diagnosis not present

## 2022-11-28 DIAGNOSIS — J069 Acute upper respiratory infection, unspecified: Secondary | ICD-10-CM

## 2022-11-28 DIAGNOSIS — E876 Hypokalemia: Secondary | ICD-10-CM

## 2022-11-28 DIAGNOSIS — R0789 Other chest pain: Secondary | ICD-10-CM | POA: Insufficient documentation

## 2022-11-28 DIAGNOSIS — F17200 Nicotine dependence, unspecified, uncomplicated: Secondary | ICD-10-CM | POA: Diagnosis not present

## 2022-11-28 MED ORDER — BENZONATATE 200 MG PO CAPS: 200.0000 mg | ORAL_CAPSULE | Freq: Four times a day (QID) | ORAL | 0 refills | Status: AC | PRN

## 2022-11-28 MED ORDER — ALBUTEROL SULFATE HFA 108 (90 BASE) MCG/ACT IN AERS: 2.0000 | INHALATION_SPRAY | RESPIRATORY_TRACT | 11 refills | Status: AC | PRN

## 2022-11-28 MED ORDER — MELOXICAM 7.5 MG PO TABS: 7.5000 mg | ORAL_TABLET | Freq: Every day | ORAL | 0 refills | Status: AC

## 2022-11-28 MED ORDER — TIVICAY 50 MG PO TABS
50.0000 mg | ORAL_TABLET | Freq: Every day | ORAL | 5 refills | Status: DC
  Filled 2022-11-28: qty 30, 30d supply, fill #0
  Filled 2023-01-07: qty 30, 30d supply, fill #1
  Filled 2023-02-06: qty 30, 30d supply, fill #2
  Filled 2023-03-04: qty 30, 30d supply, fill #3
  Filled 2023-04-14: qty 30, 30d supply, fill #4
  Filled 2023-05-13: qty 30, 30d supply, fill #5

## 2022-11-28 MED ORDER — DESCOVY 200-25 MG PO TABS
1.0000 | ORAL_TABLET | Freq: Every day | ORAL | 5 refills | Status: DC
  Filled 2022-11-28: qty 30, 30d supply, fill #0
  Filled 2023-01-07: qty 30, 30d supply, fill #1
  Filled 2023-02-06: qty 30, 30d supply, fill #2
  Filled 2023-03-04: qty 30, 30d supply, fill #3
  Filled 2023-04-14: qty 30, 30d supply, fill #4
  Filled 2023-05-13: qty 30, 30d supply, fill #5

## 2022-11-28 NOTE — Assessment & Plan Note (Signed)
She has quit smoking! Congratulated her today and encouraged to continue cessation.

## 2022-11-28 NOTE — Assessment & Plan Note (Addendum)
Very well controlled on once daily Tivicay + Descovy. No concerns with access or adherence to medication. They are tolerating the medication well without side effects. No drug interactions identified. Pertinent lab tests ordered today.  No changes to insurance coverage.  No dental needs today.  No concern over anxious/depressed mood.  Sexual health and family planning discussed - no needs today - will update pap smear in 44mat her convenience.  Vaccines updated today - see health maintenance section.  On statin therapy for secondary CV event (h/o cva)  Return in about 6 months (around 05/30/2023).

## 2022-11-28 NOTE — Patient Instructions (Addendum)
Nice to meet you!  Coricidin HBP would be good to take for the nasal congestion  Plenty of fluids, food you can tolerate and rest  Cough medicine - tesslon pearls, can also use a teaspoon of honey   Aches and pain in the hips / rib - meloxicam - take 1 tablet once a day with food. Would take daily for 5 days then can use as needed.   Tylenol 2 tabs every 6-8 hours as needed for extra aches and pains.    4-6 month follow up so I can help do your pap smear.   Nurse visit for Flu and pneumonia shot in 2 weeks when you feel better   Shingles vaccine can be done at the pharmacy - two doses 2 months apart.

## 2022-11-28 NOTE — Progress Notes (Signed)
Name: Calyn Sivils  DOB: 04-13-1966 MRN: [id number] PCP: Fenton Foy, NP    Subjective:   Chief Complaint  Patient presents with   Follow-up      HPI: Ms. Malachowski is here for her annual follow-up.  Regularly follows with Dr. Baxter Flattery last seen in December 2022.  No significant changes to her health since her last office visit together.  She continues her Tivicay and Descovy taken together every day without missed doses.   She wonders if she is getting over the flu - has been feeling down since last Thursday. She had sore throat, nasal congestion, mucus production dripping down and subjective fevers.  She says that she is improving and feeling a little bit better.  The only lingering symptom is a cough that still wakes her up at night as well as pain under her bilateral lower ribs from coughing.  She does not have a productive cough; feels like it is more dripping from her nose.  She says she needs a refill of her albuterol inhaler. She has taken a few doses of ibuprofen but this is not helping much and she does not want to take it too often because of her blood pressure. She has not yet had the flu shot.  Quit smoking about 1 month ago! Has smoked off and on for a while, started back a few years ago. Feels very ready to let it go.   Has not had a pap smear in > 5 years. Some remote abnormal studies but nothing recent. No call back for further testing or interventions.       11/28/2022   10:23 AM  Depression screen PHQ 2/9  Decreased Interest 0  Down, Depressed, Hopeless 0  PHQ - 2 Score 0    Review of Systems  Constitutional:  Positive for chills and malaise/fatigue.  Respiratory:  Positive for cough.   Cardiovascular:  Positive for chest pain.  All other systems reviewed and are negative.   Past Medical History:  Diagnosis Date   HIV (human immunodeficiency virus infection) ([place])    Hypertension    Seizures ([place])    Stroke ([place])    Vision abnormalities      Outpatient Medications Prior to Visit  Medication Sig Dispense Refill   amLODipine (NORVASC) 10 MG tablet TAKE 1 TABLET BY MOUTH EVERY DAY 90 tablet 0   atorvastatin (LIPITOR) 40 MG tablet TAKE 1 TABLET BY MOUTH EVERY DAY 30 tablet 0   cetirizine (ZYRTEC) 10 MG tablet TAKE 1 TABLET BY MOUTH EVERY DAY 90 tablet 1   clopidogrel (PLAVIX) 75 MG tablet TAKE 1 TABLET BY MOUTH EVERY DAY 90 tablet 0   fluticasone (FLONASE) 50 MCG/ACT nasal spray SPRAY 2 SPRAYS INTO EACH NOSTRIL EVERY DAY 16 mL 1   hydrochlorothiazide (HYDRODIURIL) 25 MG tablet TAKE 1 TABLET (25 MG TOTAL) BY MOUTH DAILY. 90 tablet 0   pantoprazole (PROTONIX) 40 MG tablet TAKE 1 TABLET BY MOUTH EVERY DAY 90 tablet 0   PARoxetine (PAXIL) 10 MG tablet TAKE 1 TABLET BY MOUTH EVERY DAY 90 tablet 0   traZODone (DESYREL) 50 MG tablet TAKE 1 TABLET BY MOUTH EVERYDAY AT BEDTIME 90 tablet 0   triamcinolone ointment (KENALOG) 0.5 % Apply 1 application topically 2 (two) times daily. 30 g 2   Vitamin D, Ergocalciferol, (DRISDOL) 1.25 MG (50000 UNIT) CAPS capsule TAKE 1 CAPSULE (50,000 UNITS TOTAL) BY MOUTH EVERY 7 (SEVEN) DAYS. WEEKLY 12 capsule 0   albuterol (PROVENTIL HFA;VENTOLIN HFA)  108 (90 Base) MCG/ACT inhaler Inhale 2 puffs into the lungs every 4 (four) hours as needed for wheezing or shortness of breath (cough, shortness of breath or wheezing.). 1 Inhaler 6   dolutegravir (TIVICAY) 50 MG tablet Take 1 tablet (50 mg total) by mouth daily. 30 tablet 11   emtricitabine-tenofovir AF (DESCOVY) 200-25 MG tablet Take 1 tablet by mouth daily. 30 tablet 11   cimetidine (TAGAMET) 200 MG tablet Take 1 tablet (200 mg total) by mouth 2 (two) times daily. 60 tablet 2   benzonatate (TESSALON) 100 MG capsule Take 1 capsule (100 mg total) by mouth 3 (three) times daily as needed for cough. (Patient not taking: Reported on 11/28/2022) 30 capsule 0   No facility-administered medications prior to visit.     Allergies  Allergen Reactions   Lisinopril  Swelling    Social History   Tobacco Use   Smoking status: Former    Packs/day: 0.50    Types: Cigarettes    Start date: 10/17/2015    Quit date: 10/30/2022    Years since quitting: 0.0   Smokeless tobacco: Never   Tobacco comments:    Stopped again 10/2022.   Vaping Use   Vaping Use: Never used  Substance Use Topics   Alcohol use: Yes    Alcohol/week: 60.0 standard drinks of alcohol    Types: 60 Standard drinks or equivalent per week    Comment: Drinks on weekends (3 drinks)   Drug use: No    Family History  Problem Relation Age of Onset   Hypertension Mother    Colon cancer Neg Hx    Stomach cancer Neg Hx    Esophageal cancer Neg Hx     Social History   Substance and Sexual Activity  Sexual Activity Not Currently   Partners: Male   Birth control/protection: Condom   Comment: offered condoms     Objective:   Vitals:   11/28/22 1020  BP: 110/81  Pulse: 78  Resp: 16  SpO2: 100%  Weight: 138 lb 4.8 oz (62.7 kg)  Height: '5\' 3"'$  (1.6 m)   Body mass index is 24.5 kg/m.  Physical Exam Vitals reviewed.  Constitutional:      Appearance: Normal appearance. She is not ill-appearing.  HENT:     Mouth/Throat:     Mouth: Mucous membranes are moist.     Pharynx: Oropharynx is clear.  Eyes:     General: No scleral icterus. Cardiovascular:     Rate and Rhythm: Normal rate and regular rhythm.  Pulmonary:     Effort: Pulmonary effort is normal. No respiratory distress.     Breath sounds: No wheezing, rhonchi or rales.  Chest:       Comments: Reproducible pain at lower rip bilaterally.  Neurological:     Mental Status: She is oriented to person, place, and time.  Psychiatric:        Mood and Affect: Mood normal.        Thought Content: Thought content normal.     Lab Results Lab Results  Component Value Date   WBC 6.0 11/28/2022   HGB 14.9 11/28/2022   HCT 41.2 11/28/2022   MCV 88.2 11/28/2022   PLT 206 11/28/2022    Lab Results  Component  Value Date   CREATININE 0.86 09/08/2021   BUN 10 09/08/2021   NA 137 09/08/2021   K 3.5 09/08/2021   CL 104 09/08/2021   CO2 26 09/08/2021    Lab Results  Component Value  Date   ALT 37 09/08/2021   AST 27 09/08/2021   ALKPHOS 62 09/08/2021   BILITOT 1.5 (H) 09/08/2021    Lab Results  Component Value Date   CHOL 148 09/08/2021   HDL 65 09/08/2021   LDLCALC 71 09/08/2021   TRIG 59 09/08/2021   CHOLHDL 2.3 09/08/2021   HIV 1 RNA Quant (Copies/mL)  Date Value  11/26/2021 Not Detected  04/17/2021 Not Detected  10/18/2020 <20   CD4 T Cell Abs (/uL)  Date Value  11/26/2021 536  04/17/2021 456  10/18/2020 453     Assessment & Plan:   Problem List Items Addressed This Visit       Unprioritized   Human immunodeficiency virus (HIV) disease ([place]) - Primary (Chronic)    Very well controlled on once daily Tivicay + Descovy. No concerns with access or adherence to medication. They are tolerating the medication well without side effects. No drug interactions identified. Pertinent lab tests ordered today.  No changes to insurance coverage.  No dental needs today.  No concern over anxious/depressed mood.  Sexual health and family planning discussed - no needs today - will update pap smear in 53mat her convenience.  Vaccines updated today - see health maintenance section.  On statin therapy for secondary CV event (h/o cva)  Return in about 6 months (around 05/30/2023).        Relevant Orders   HIV 1 RNA quant-no reflex-bld   T-helper cells (CD4) count   COMPLETE METABOLIC PANEL WITH GFR   CBC (Completed)   Costochondral chest pain    Reproducible pain at the joints of 12 rib bilaterally. Will treat with meloxicam 7.5 mg QD x 5d then PRN in hopes this will impact BP less. OK to take short term with HIV meds. Counseled to avoid coadministration with ibuprofen. Can do PRN tylenol 1g TID PRN.  Tessalon pearls and inhaler.       Tobacco dependence    She has quit smoking!  Congratulated her today and encouraged to continue cessation.       Upper respiratory infection, viral    Clear lung fields with overall improving presumed viral illness. She is nearly a week out from acute chills/subjective fevers. We discussed symptomatic care with BP safe options for OTC cares.  No indications for secondary bacterial infection or further testing at this time.  Refill her albuterol inhaler per her request.        SJanene Madeira MSN, NP-C RWestbrookfor Infectious DSmithvillePager: [phone]: [phone] 11/28/22  8:02 PM

## 2022-11-28 NOTE — Assessment & Plan Note (Signed)
Clear lung fields with overall improving presumed viral illness. She is nearly a week out from acute chills/subjective fevers. We discussed symptomatic care with BP safe options for OTC cares.  No indications for secondary bacterial infection or further testing at this time.  Refill her albuterol inhaler per her request.

## 2022-11-28 NOTE — Assessment & Plan Note (Addendum)
Reproducible pain at the joints of 12 rib bilaterally. Will treat with meloxicam 7.5 mg QD x 5d then PRN in hopes this will impact BP less. OK to take short term with HIV meds. Counseled to avoid coadministration with ibuprofen. Can do PRN tylenol 1g TID PRN.  Tessalon pearls and inhaler.

## 2022-11-29 ENCOUNTER — Other Ambulatory Visit (HOSPITAL_COMMUNITY): Payer: Self-pay

## 2022-11-29 ENCOUNTER — Other Ambulatory Visit: Payer: Self-pay

## 2022-11-29 LAB — T-HELPER CELLS (CD4) COUNT (NOT AT ARMC)
CD4 % Helper T Cell: 30 % — ABNORMAL LOW (ref 33–65)
CD4 T Cell Abs: 386 /uL — ABNORMAL LOW (ref 400–1790)

## 2022-11-30 ENCOUNTER — Other Ambulatory Visit: Payer: Self-pay | Admitting: Nurse Practitioner

## 2022-12-02 LAB — COMPLETE METABOLIC PANEL WITH GFR
AG Ratio: 1 (calc) (ref 1.0–2.5)
ALT: 42 U/L — ABNORMAL HIGH (ref 6–29)
AST: 32 U/L (ref 10–35)
Albumin: 3.7 g/dL (ref 3.6–5.1)
Alkaline phosphatase (APISO): 68 U/L (ref 37–153)
BUN: 13 mg/dL (ref 7–25)
CO2: 32 mmol/L (ref 20–32)
Calcium: 9 mg/dL (ref 8.6–10.4)
Chloride: 97 mmol/L — ABNORMAL LOW (ref 98–110)
Creat: 0.99 mg/dL (ref 0.50–1.03)
Globulin: 3.7 g/dL (calc) (ref 1.9–3.7)
Glucose, Bld: 109 mg/dL — ABNORMAL HIGH (ref 65–99)
Potassium: 3 mmol/L — ABNORMAL LOW (ref 3.5–5.3)
Sodium: 141 mmol/L (ref 135–146)
Total Bilirubin: 1.4 mg/dL — ABNORMAL HIGH (ref 0.2–1.2)
Total Protein: 7.4 g/dL (ref 6.1–8.1)
eGFR: 66 mL/min/{1.73_m2} (ref >=60)

## 2022-12-02 LAB — CBC
HCT: 41.2 % (ref 35.0–45.0)
Hemoglobin: 14.9 g/dL (ref 11.7–15.5)
MCH: 31.9 pg (ref 27.0–33.0)
MCHC: 36.2 g/dL — ABNORMAL HIGH (ref 32.0–36.0)
MCV: 88.2 fL (ref 80.0–100.0)
MPV: 11.5 fL (ref 7.5–12.5)
Platelets: 206 10*3/uL (ref 140–400)
RBC: 4.67 10*6/uL (ref 3.80–5.10)
RDW: 11.8 % (ref 11.0–15.0)
WBC: 6 10*3/uL (ref 3.8–10.8)

## 2022-12-02 LAB — HIV-1 RNA QUANT-NO REFLEX-BLD
HIV 1 RNA Quant: NOT DETECTED Copies/mL
HIV-1 RNA Quant, Log: NOT DETECTED Log cps/mL

## 2022-12-04 MED ORDER — POTASSIUM CHLORIDE CRYS ER 20 MEQ PO TBCR: 20.0000 meq | EXTENDED_RELEASE_TABLET | Freq: Every day | ORAL | 5 refills | Status: AC

## 2022-12-04 NOTE — Addendum Note (Signed)
Addended by:  Callas on: 12/04/2022 03:53 PM   Modules accepted: Orders

## 2022-12-08 ENCOUNTER — Other Ambulatory Visit: Payer: Self-pay | Admitting: Nurse Practitioner

## 2022-12-08 DIAGNOSIS — I10 Essential (primary) hypertension: Secondary | ICD-10-CM

## 2022-12-12 ENCOUNTER — Ambulatory Visit: Payer: Medicare Other | Admitting: Nurse Practitioner

## 2022-12-12 ENCOUNTER — Ambulatory Visit: Payer: Medicare Other

## 2022-12-13 ENCOUNTER — Other Ambulatory Visit: Payer: Self-pay

## 2022-12-13 ENCOUNTER — Ambulatory Visit: Payer: Medicare Other

## 2022-12-24 ENCOUNTER — Other Ambulatory Visit (HOSPITAL_COMMUNITY): Payer: Self-pay

## 2023-01-07 ENCOUNTER — Other Ambulatory Visit (HOSPITAL_COMMUNITY): Payer: Self-pay

## 2023-01-14 ENCOUNTER — Other Ambulatory Visit: Payer: Self-pay

## 2023-01-21 ENCOUNTER — Ambulatory Visit (INDEPENDENT_AMBULATORY_CARE_PROVIDER_SITE_OTHER): Payer: 59 | Admitting: Neurology

## 2023-01-21 ENCOUNTER — Encounter: Payer: Self-pay | Admitting: Neurology

## 2023-01-21 VITALS — BP 118/86 | HR 83 | Ht 63.0 in | Wt 143.4 lb

## 2023-01-21 DIAGNOSIS — R200 Anesthesia of skin: Secondary | ICD-10-CM | POA: Diagnosis not present

## 2023-01-21 DIAGNOSIS — I63132 Cerebral infarction due to embolism of left carotid artery: Secondary | ICD-10-CM | POA: Diagnosis not present

## 2023-01-21 NOTE — Progress Notes (Signed)
Patient: Abigail Medina Date of Birth: 05/10/1966  Reason for Visit: Follow up History from: Patient Primary Neurologist: [facility]   ASSESSMENT AND PLAN 57 y.o. year old female   56.  Left arm numbness, paresthesia 2.  History of strokes 3.  HIV disease -I will reorder EMG nerve conduction study for further evaluation of the left arm numbness -Will determine follow-up based on results  HISTORY OF PRESENT ILLNESS: Today 01/21/23,  At last visit EMG was ordered and it was sent to [facility]. Seems as tho they tried to contact her without success. Has left hand numbness to the hand, forearm, feels cold. Worse in the morning. Will try to sit on it or shake it out. Feels like it asleep all the time. It isn't weak.   HISTORY Dr. Felecia Shelling 07/09/22: I had the pleasure of seeing patient, Abigail Medina, at [facility] for neurologic consultation regarding her strokes.   In February 2012, she presented with left sided weakness and numbness. The weakness was much worse in the hand and the face than in the leg. Numbness was on the left side. Her right side had no weakness or numbness at the time. She had minimal recovery after the stroke and continued to have numbness and weakness in the left hand and face.  Additionally, gait was reduced.   In April 2020, she had facial numbness and presented to the ED and was found to have an acute left posterior frontal CVA.   That stroke did not affect the arms or legs.  Most recent MRI of the brain from 09/06/2021 shows multiple chronic infarctions in the right cerebellar hemisphere, and in the bilateral parietal and frontal lobes.   Currently, she notes gait is off due to reduced balance.   Turns are off balanced.   She can walk without an aid.   She has no recent falls.  She continues to have left sided weakness and soreness combined with numbness.  Her weakness and numbness is owrse in he left hand than the leg.  Her right side is fine.       Compared to last year, she notes more numbness in the left hand. This involves all fingers.  Of note, a nerve conduction study in the past had shown ulnar neuropathy bilaterally, worse on the left.   She has HIV (diagnosed 2000).   She is on Tivicay and Descovy   Viral load is zero.  CD4 is normal.     Vascular risks: Hypertension, aneurysm of thoracic aorta, hyperlipidemia.   Current stroke prophylaxis: Clopidogrel 75 mg daily.   IMAGING: The CT scan of the head 06/01/2011 showed chronic right frontal wedge-shaped stroke and a small wedge-shaped right cerebellar hemisphere stroke. The MRI from 01/25/2011 showed that the cerebellar stroke was chronic at that time and the right frontal stroke was acute at that time.      MRI of the cervical spine 08/28/11 just showed minimal DDD and the spinal cord was normal.   Laboratory tests and records from her 2012 hospital stay were also evaluated.   MRI 03/28/2019 showed acute posterior left frontal lobe stroke and old strokes bilateral frontal and parietal lobes and right cerebellum.      MRI of the brain 09/06/2021 showed chronic infarcts and encephalomalacia in the bilateral MCA territories, including perirolandic cortex on the right, parietal lobe and anterior frontal lobe on the left. Chronic infarct in the right cerebellum.  Nothing acute and no new strokes compared to  t2020   MRA neck/head 09/06/2021 were negative   OTHER STUDIES On 04/15/2012, she had a nerve conduction study of both arms. 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 The median nerves were normal. She did not have EMG testing at that time.   A year earlier, she had nerve conduction and EMG testing left arm and that test showed left ulnar neuropathy. Limited EMG did not show radiculopathy.   REVIEW OF SYSTEMS: Out of a complete 14 system review of symptoms, the patient complains only of the following symptoms, and all other reviewed systems are negative.  See  HPI  ALLERGIES: Allergies  Allergen Reactions   Lisinopril Swelling    HOME MEDICATIONS: Outpatient Medications Prior to Visit  Medication Sig Dispense Refill   albuterol (VENTOLIN HFA) 108 (90 Base) MCG/ACT inhaler Inhale 2 puffs into the lungs every 4 (four) hours as needed for wheezing or shortness of breath (cough, shortness of breath or wheezing.). 1 each 11   amLODipine (NORVASC) 10 MG tablet TAKE 1 TABLET BY MOUTH EVERY DAY 90 tablet 0   atorvastatin (LIPITOR) 40 MG tablet TAKE 1 TABLET BY MOUTH EVERY DAY 30 tablet 0   benzonatate (TESSALON) 200 MG capsule Take 1 capsule (200 mg total) by mouth every 6 (six) hours as needed for cough. 30 capsule 0   cetirizine (ZYRTEC) 10 MG tablet TAKE 1 TABLET BY MOUTH EVERY DAY 90 tablet 1   clopidogrel (PLAVIX) 75 MG tablet TAKE 1 TABLET BY MOUTH EVERY DAY 90 tablet 0   dolutegravir (TIVICAY) 50 MG tablet Take 1 tablet (50 mg total) by mouth daily. 30 tablet 5   emtricitabine-tenofovir AF (DESCOVY) 200-25 MG tablet Take 1 tablet by mouth daily. 30 tablet 5   fluticasone (FLONASE) 50 MCG/ACT nasal spray SPRAY 2 SPRAYS INTO EACH NOSTRIL EVERY DAY 16 mL 1   hydrochlorothiazide (HYDRODIURIL) 25 MG tablet TAKE 1 TABLET (25 MG TOTAL) BY MOUTH DAILY. 90 tablet 0   meloxicam (MOBIC) 7.5 MG tablet Take 1 tablet (7.5 mg total) by mouth daily. 20 tablet 0   pantoprazole (PROTONIX) 40 MG tablet TAKE 1 TABLET BY MOUTH EVERY DAY 90 tablet 0   PARoxetine (PAXIL) 10 MG tablet TAKE 1 TABLET BY MOUTH EVERY DAY 90 tablet 0   potassium chloride SA (KLOR-CON M) 20 MEQ tablet Take 1 tablet (20 mEq total) by mouth daily. 30 tablet 5   traZODone (DESYREL) 50 MG tablet TAKE 1 TABLET BY MOUTH EVERYDAY AT BEDTIME 90 tablet 0   triamcinolone ointment (KENALOG) 0.5 % Apply 1 application topically 2 (two) times daily. 30 g 2   Vitamin D, Ergocalciferol, (DRISDOL) 1.25 MG (50000 UNIT) CAPS capsule TAKE 1 CAPSULE (50,000 UNITS TOTAL) BY MOUTH EVERY 7 (SEVEN) DAYS. WEEKLY 12  capsule 0   cimetidine (TAGAMET) 200 MG tablet Take 1 tablet (200 mg total) by mouth 2 (two) times daily. 60 tablet 2   No facility-administered medications prior to visit.    PAST MEDICAL HISTORY: Past Medical History:  Diagnosis Date   HIV (human immunodeficiency virus infection) ([place])    Hypertension    Seizures ([place])    Stroke ([place])    Vision abnormalities     PAST SURGICAL HISTORY: Past Surgical History:  Procedure Laterality Date   LOOP RECORDER INSERTION N/A 03/29/2019   Procedure: LOOP RECORDER INSERTION;  Surgeon: Constance Haw, MD;  Location: [facility];  Service: Cardiovascular;  Laterality: N/A;    FAMILY HISTORY: Family History  Problem Relation Age  of Onset   Hypertension Mother    Colon cancer Neg Hx    Stomach cancer Neg Hx    Esophageal cancer Neg Hx     SOCIAL HISTORY: Social History   Socioeconomic History   Marital status: Single    Spouse name: Not on file   Number of children: Not on file   Years of education: Not on file   Highest education level: Not on file  Occupational History   Not on file  Tobacco Use   Smoking status: Former    Packs/day: 0.50    Types: Cigarettes    Start date: 10/17/2015    Quit date: 10/30/2022    Years since quitting: 0.2   Smokeless tobacco: Never   Tobacco comments:    Stopped again 10/2022.   Vaping Use   Vaping Use: Never used  Substance and Sexual Activity   Alcohol use: Yes    Alcohol/week: 60.0 standard drinks of alcohol    Types: 60 Standard drinks or equivalent per week    Comment: Drinks on weekends (3 drinks)   Drug use: No   Sexual activity: Not Currently    Partners: Male    Birth control/protection: Condom    Comment: offered condoms  Other Topics Concern   Not on file  Social History Narrative   Right handed   Caffeine use: no coffee, tea or soda   Social Determinants of Radio broadcast assistant Strain: Not on file  Food Insecurity: Not on file  Transportation  Needs: Not on file  Physical Activity: Not on file  Stress: Not on file  Social Connections: Not on file  Intimate Partner Violence: Not on file    PHYSICAL EXAM  Vitals:   01/21/23 1100  BP: 118/86  Pulse: 83  Weight: 143 lb 6.4 oz (65 kg)  Height: '5\' 3"'$  (1.6 m)   Body mass index is 25.4 kg/m.  Generalized: Well developed, in no acute distress  Neurological examination  Mentation: Alert oriented to time, place, history taking. Follows all commands speech and language fluent Cranial nerve II-XII: Pupils were equal round reactive to light. Extraocular movements were full, visual field were full on confrontational test. Facial sensation and strength were normal. Head turning and shoulder shrug  were normal and symmetric. Motor: 4/5 left hand weakness with grip and strength Sensory: Decreased soft touch sensation to the left face, arm Coordination: Cerebellar testing reveals good finger-nose-finger and heel-to-shin bilaterally.  Gait and station: Gait is normal.  Reflexes: Deep tendon reflexes are symmetric but increased throughout.  DIAGNOSTIC DATA (LABS, IMAGING, TESTING) - I reviewed patient records, labs, notes, testing and imaging myself where available.  Lab Results  Component Value Date   WBC 6.0 11/28/2022   HGB 14.9 11/28/2022   HCT 41.2 11/28/2022   MCV 88.2 11/28/2022   PLT 206 11/28/2022      Component Value Date/Time   NA 141 11/28/2022 1052   NA 141 09/26/2020 1000   K 3.0 (L) 11/28/2022 1052   CL 97 (L) 11/28/2022 1052   CO2 32 11/28/2022 1052   GLUCOSE 109 (H) 11/28/2022 1052   BUN 13 11/28/2022 1052   BUN 13 09/26/2020 1000   CREATININE 0.99 11/28/2022 1052   CALCIUM 9.0 11/28/2022 1052   PROT 7.4 11/28/2022 1052   PROT 6.4 09/26/2020 1000   ALBUMIN 3.3 (L) 09/08/2021 0623   ALBUMIN 4.0 09/26/2020 1000   AST 32 11/28/2022 1052   ALT 42 (H) 11/28/2022 1052  ALKPHOS 62 09/08/2021 0623   BILITOT 1.4 (H) 11/28/2022 1052   BILITOT 0.9 09/26/2020  1000   GFRNONAA >60 09/08/2021 0623   GFRNONAA 81 10/18/2020 1215   GFRAA 94 10/18/2020 1215   Lab Results  Component Value Date   CHOL 148 09/08/2021   HDL 65 09/08/2021   LDLCALC 71 09/08/2021   TRIG 59 09/08/2021   CHOLHDL 2.3 09/08/2021   Lab Results  Component Value Date   HGBA1C 5.4 09/07/2021   Lab Results  Component Value Date   VITAMINB12 1,086 (H) 09/08/2021   Lab Results  Component Value Date   TSH 3.152 03/28/2019    Butler Denmark, AGNP-C, DNP 01/21/2023, 11:36 AM [facility] [address] [place], [place] [address] [phone]

## 2023-01-21 NOTE — Patient Instructions (Signed)
I will order an nerve conduction study for further evaluation of your symptoms

## 2023-02-06 ENCOUNTER — Other Ambulatory Visit (HOSPITAL_COMMUNITY): Payer: Self-pay

## 2023-02-07 ENCOUNTER — Other Ambulatory Visit (HOSPITAL_COMMUNITY): Payer: Self-pay

## 2023-03-04 ENCOUNTER — Other Ambulatory Visit (HOSPITAL_COMMUNITY): Payer: Self-pay

## 2023-03-10 ENCOUNTER — Ambulatory Visit: Payer: 59 | Attending: Nurse Practitioner

## 2023-03-13 ENCOUNTER — Encounter: Payer: 59 | Admitting: Neurology

## 2023-03-17 ENCOUNTER — Other Ambulatory Visit: Payer: Self-pay

## 2023-03-20 ENCOUNTER — Encounter: Payer: 59 | Admitting: Neurology

## 2023-03-25 ENCOUNTER — Other Ambulatory Visit: Payer: Self-pay | Admitting: Nurse Practitioner

## 2023-03-25 DIAGNOSIS — Z1231 Encounter for screening mammogram for malignant neoplasm of breast: Secondary | ICD-10-CM

## 2023-03-26 ENCOUNTER — Ambulatory Visit
Admission: RE | Admit: 2023-03-26 | Discharge: 2023-03-26 | Disposition: A | Discharge status: Home / Self Care | Payer: 59 | Source: Ambulatory Visit | Attending: Nurse Practitioner | Admitting: Nurse Practitioner

## 2023-03-26 DIAGNOSIS — Z1231 Encounter for screening mammogram for malignant neoplasm of breast: Secondary | ICD-10-CM

## 2023-04-14 ENCOUNTER — Other Ambulatory Visit: Payer: Self-pay

## 2023-04-21 ENCOUNTER — Other Ambulatory Visit: Payer: Self-pay

## 2023-05-08 ENCOUNTER — Other Ambulatory Visit (HOSPITAL_COMMUNITY): Payer: Self-pay

## 2023-05-13 ENCOUNTER — Other Ambulatory Visit (HOSPITAL_COMMUNITY): Payer: Self-pay

## 2023-05-15 ENCOUNTER — Other Ambulatory Visit: Payer: Self-pay

## 2023-05-15 ENCOUNTER — Ambulatory Visit (INDEPENDENT_AMBULATORY_CARE_PROVIDER_SITE_OTHER): Payer: 59 | Admitting: Infectious Diseases

## 2023-05-15 ENCOUNTER — Encounter: Payer: Self-pay | Admitting: Infectious Diseases

## 2023-05-15 ENCOUNTER — Other Ambulatory Visit (HOSPITAL_COMMUNITY)
Admission: RE | Admit: 2023-05-15 | Discharge: 2023-05-15 | Disposition: A | Discharge status: Home / Self Care | Payer: 59 | Source: Ambulatory Visit | Attending: Infectious Diseases | Admitting: Infectious Diseases

## 2023-05-15 VITALS — BP 119/88 | HR 81 | Temp 97.7°F | Ht 63.0 in | Wt 145.0 lb

## 2023-05-15 DIAGNOSIS — B20 Human immunodeficiency virus [HIV] disease: Secondary | ICD-10-CM

## 2023-05-15 DIAGNOSIS — Z124 Encounter for screening for malignant neoplasm of cervix: Secondary | ICD-10-CM

## 2023-05-15 DIAGNOSIS — Z1151 Encounter for screening for human papillomavirus (HPV): Secondary | ICD-10-CM | POA: Diagnosis not present

## 2023-05-15 DIAGNOSIS — A630 Anogenital (venereal) warts: Secondary | ICD-10-CM

## 2023-05-15 DIAGNOSIS — Z01419 Encounter for gynecological examination (general) (routine) without abnormal findings: Secondary | ICD-10-CM | POA: Diagnosis present

## 2023-05-15 NOTE — Assessment & Plan Note (Signed)
On statin therapy - here for pap smear / cervical cancer screening.  FU scheduled with Dr. Drue Second for annual follow up in December.

## 2023-05-15 NOTE — Assessment & Plan Note (Signed)
Given size and volume I am not confident aldera will work for her and recommended referral to GYN for treatment.

## 2023-05-15 NOTE — Progress Notes (Signed)
Name: Abigail Medina  DOB: 1966/05/22 MRN: [id number] PCP: Barbette Merino, NP    Subjective:   Chief Complaint  Patient presents with   Follow-up    pap      HPI: Abigail Medina is here to update her pap smear for cervical cancer screening - last done in 2020 with normal test results. Has had some remote h/o abnormal studies that required biopsy but not in a long time.  Post-menopausal.  She has a skin tag on right thigh that she wants to see if OK to use OTC removal treatments. Also has several genital warts she would like to have me look at.   She is on atorvostatin daily and continues on her tivicay/descovy for treatment.       05/15/2023    2:22 PM  Depression screen PHQ 2/9  Decreased Interest 0  Down, Depressed, Hopeless 0  PHQ - 2 Score 0    Review of Systems  All other systems reviewed and are negative.   Past Medical History:  Diagnosis Date   HIV (human immunodeficiency virus infection) ([facility])    Hypertension    Seizures ([facility])    Stroke ([facility])    Vision abnormalities     Outpatient Medications Prior to Visit  Medication Sig Dispense Refill   albuterol (VENTOLIN HFA) 108 (90 Base) MCG/ACT inhaler Inhale 2 puffs into the lungs every 4 (four) hours as needed for wheezing or shortness of breath (cough, shortness of breath or wheezing.). 1 each 11   amLODipine (NORVASC) 10 MG tablet TAKE 1 TABLET BY MOUTH EVERY DAY 90 tablet 0   atorvastatin (LIPITOR) 40 MG tablet TAKE 1 TABLET BY MOUTH EVERY DAY 30 tablet 0   benzonatate (TESSALON) 200 MG capsule Take 1 capsule (200 mg total) by mouth every 6 (six) hours as needed for cough. 30 capsule 0   cetirizine (ZYRTEC) 10 MG tablet TAKE 1 TABLET BY MOUTH EVERY DAY 90 tablet 1   cimetidine (TAGAMET) 200 MG tablet Take 1 tablet (200 mg total) by mouth 2 (two) times daily. 60 tablet 2   clopidogrel (PLAVIX) 75 MG tablet TAKE 1 TABLET BY MOUTH EVERY DAY 90 tablet 0   dolutegravir (TIVICAY) 50 MG tablet Take 1 tablet (50 mg  total) by mouth daily. 30 tablet 5   emtricitabine-tenofovir AF (DESCOVY) 200-25 MG tablet Take 1 tablet by mouth daily. 30 tablet 5   fluticasone (FLONASE) 50 MCG/ACT nasal spray SPRAY 2 SPRAYS INTO EACH NOSTRIL EVERY DAY 16 mL 1   hydrochlorothiazide (HYDRODIURIL) 25 MG tablet TAKE 1 TABLET (25 MG TOTAL) BY MOUTH DAILY. 90 tablet 0   pantoprazole (PROTONIX) 40 MG tablet TAKE 1 TABLET BY MOUTH EVERY DAY 90 tablet 0   PARoxetine (PAXIL) 10 MG tablet TAKE 1 TABLET BY MOUTH EVERY DAY 90 tablet 0   potassium chloride SA (KLOR-CON M) 20 MEQ tablet Take 1 tablet (20 mEq total) by mouth daily. 30 tablet 5   traZODone (DESYREL) 50 MG tablet TAKE 1 TABLET BY MOUTH EVERYDAY AT BEDTIME 90 tablet 0   triamcinolone ointment (KENALOG) 0.5 % Apply 1 application topically 2 (two) times daily. 30 g 2   Vitamin D, Ergocalciferol, (DRISDOL) 1.25 MG (50000 UNIT) CAPS capsule TAKE 1 CAPSULE (50,000 UNITS TOTAL) BY MOUTH EVERY 7 (SEVEN) DAYS. WEEKLY 12 capsule 0   meloxicam (MOBIC) 7.5 MG tablet Take 1 tablet (7.5 mg total) by mouth daily. (Patient not taking: Reported on 05/15/2023) 20 tablet 0   No facility-administered  medications prior to visit.     Allergies  Allergen Reactions   Lisinopril Swelling    Social History   Tobacco Use   Smoking status: Every Day    Packs/day: .5    Types: Cigarettes    Start date: 10/17/2015    Last attempt to quit: 10/30/2022    Years since quitting: 0.5   Smokeless tobacco: Never  Vaping Use   Vaping Use: Never used  Substance Use Topics   Alcohol use: Yes    Alcohol/week: 60.0 standard drinks of alcohol    Types: 60 Standard drinks or equivalent per week    Comment: Drinks on weekends (3 drinks)   Drug use: No    Family History  Problem Relation Age of Onset   Hypertension Mother    Colon cancer Neg Hx    Stomach cancer Neg Hx    Esophageal cancer Neg Hx     Social History   Substance and Sexual Activity  Sexual Activity Not Currently   Partners:  Male   Birth control/protection: Condom   Comment: offered condoms     Objective:   Vitals:   05/15/23 1415  BP: 119/88  Pulse: 81  Temp: 97.7 F (36.5 C)  TempSrc: Temporal  SpO2: 100%  Weight: 145 lb (65.8 kg)  Height: 5\' 3"  (1.6 m)   Body mass index is 25.69 kg/m.  Physical Exam Exam conducted with a chaperone present.  Genitourinary:      Comments: Multiple flat warts noted around perianal skin with 2 larger more mounded ones.     Lab Results Lab Results  Component Value Date   WBC 6.0 11/28/2022   HGB 14.9 11/28/2022   HCT 41.2 11/28/2022   MCV 88.2 11/28/2022   PLT 206 11/28/2022    Lab Results  Component Value Date   CREATININE 0.99 11/28/2022   BUN 13 11/28/2022   NA 141 11/28/2022   K 3.0 (L) 11/28/2022   CL 97 (L) 11/28/2022   CO2 32 11/28/2022    Lab Results  Component Value Date   ALT 42 (H) 11/28/2022   AST 32 11/28/2022   ALKPHOS 62 09/08/2021   BILITOT 1.4 (H) 11/28/2022    Lab Results  Component Value Date   CHOL 148 09/08/2021   HDL 65 09/08/2021   LDLCALC 71 09/08/2021   TRIG 59 09/08/2021   CHOLHDL 2.3 09/08/2021   HIV 1 RNA Quant (Copies/mL)  Date Value  11/28/2022 Not Detected  11/26/2021 Not Detected  04/17/2021 Not Detected   CD4 T Cell Abs (/uL)  Date Value  11/28/2022 386 (L)  11/26/2021 536  04/17/2021 456     Assessment & Plan:   Problem List Items Addressed This Visit       Unprioritized   Human immunodeficiency virus (HIV) disease (HCC) - Primary (Chronic)    On statin therapy - here for pap smear / cervical cancer screening.  FU scheduled with Dr. Drue Medina for annual follow up in December.       Perianal venereal warts    Given size and volume I am not confident aldera will work for her and recommended referral to GYN for treatment.       Relevant Orders   Ambulatory referral to Gynecology   Screening for cervical cancer    Normal pelvic exam. External perineal warts noted.  Cervical brushing  collected for cytology and HPV.  Discussed recommended screening interval for women living with HIV disease meant to be lifelong and at  an interval of Q1-3 years pending results. Acceptable to space out Q3y with 3 consecutively normal exams. Further recommendations for Abigail Medina to follow today's results.  Results will be communicated to the patient via mychart.       Relevant Orders   Cytology - PAP( [place])   Rexene Alberts, MSN, NP-C [facility] for Infectious Disease [facility] Pager: [phone] Office: [phone]  05/15/23  3:09 PM

## 2023-05-15 NOTE — Assessment & Plan Note (Signed)
Normal pelvic exam. External perineal warts noted.  Cervical brushing collected for cytology and HPV.  Discussed recommended screening interval for women living with HIV disease meant to be lifelong and at an interval of Q1-3 years pending results. Acceptable to space out Q3y with 3 consecutively normal exams. Further recommendations for Abigail Medina to follow today's results.  Results will be communicated to the patient via mychart.

## 2023-05-19 ENCOUNTER — Other Ambulatory Visit (HOSPITAL_COMMUNITY): Payer: Self-pay

## 2023-05-20 LAB — CYTOLOGY - PAP
Adequacy: ABSENT
Comment: NEGATIVE
Diagnosis: NEGATIVE
High risk HPV: NEGATIVE

## 2023-06-12 ENCOUNTER — Other Ambulatory Visit: Payer: Self-pay | Admitting: Infectious Diseases

## 2023-06-12 ENCOUNTER — Encounter: Payer: 59 | Admitting: Neurology

## 2023-06-12 DIAGNOSIS — E876 Hypokalemia: Secondary | ICD-10-CM

## 2023-06-12 NOTE — Telephone Encounter (Signed)
Patient has PCP.   Danniel Tones D Collyns Mcquigg, RN  

## 2023-06-16 ENCOUNTER — Other Ambulatory Visit (HOSPITAL_COMMUNITY): Payer: Self-pay

## 2023-06-16 ENCOUNTER — Other Ambulatory Visit: Payer: Self-pay | Admitting: Internal Medicine

## 2023-06-16 ENCOUNTER — Other Ambulatory Visit: Payer: Self-pay

## 2023-06-16 DIAGNOSIS — B20 Human immunodeficiency virus [HIV] disease: Secondary | ICD-10-CM

## 2023-06-16 MED ORDER — TIVICAY 50 MG PO TABS
50.0000 mg | ORAL_TABLET | Freq: Every day | ORAL | 5 refills | Status: DC
  Filled 2023-06-16: qty 30, 30d supply, fill #0
  Filled 2023-07-10 – 2023-07-23 (×2): qty 30, 30d supply, fill #1
  Filled 2023-08-19: qty 30, 30d supply, fill #2
  Filled 2023-09-22: qty 30, 30d supply, fill #3
  Filled 2023-10-24: qty 30, 30d supply, fill #4
  Filled 2023-12-03: qty 30, 30d supply, fill #5

## 2023-06-16 MED ORDER — DESCOVY 200-25 MG PO TABS
1.0000 | ORAL_TABLET | Freq: Every day | ORAL | 5 refills | Status: DC
  Filled 2023-06-16: qty 30, 30d supply, fill #0
  Filled 2023-07-10 – 2023-07-23 (×2): qty 30, 30d supply, fill #1
  Filled 2023-08-19: qty 30, 30d supply, fill #2
  Filled 2023-09-22: qty 30, 30d supply, fill #3
  Filled 2023-10-24: qty 30, 30d supply, fill #4
  Filled 2023-12-03: qty 30, 30d supply, fill #5

## 2023-06-20 ENCOUNTER — Other Ambulatory Visit: Payer: Self-pay

## 2023-07-10 ENCOUNTER — Other Ambulatory Visit (HOSPITAL_COMMUNITY): Payer: Self-pay

## 2023-07-23 ENCOUNTER — Other Ambulatory Visit (HOSPITAL_COMMUNITY): Payer: Self-pay

## 2023-07-25 ENCOUNTER — Other Ambulatory Visit (HOSPITAL_COMMUNITY): Payer: Self-pay

## 2023-08-14 ENCOUNTER — Encounter: Payer: 59 | Admitting: Neurology

## 2023-08-14 ENCOUNTER — Encounter: Payer: Self-pay | Admitting: Neurology

## 2023-08-14 ENCOUNTER — Encounter: Payer: Medicare HMO | Admitting: Neurology

## 2023-08-19 ENCOUNTER — Other Ambulatory Visit (HOSPITAL_COMMUNITY): Payer: Self-pay

## 2023-08-20 ENCOUNTER — Encounter: Payer: Self-pay | Admitting: Neurology

## 2023-08-28 IMAGING — US US BREAST*L* LIMITED INC AXILLA
1 series · 6 of 6 positions shown · non-contrast
Comparison: Previous exam(s).

CLINICAL DATA: Patient returns after screening study for evaluation
possible LEFT breast mass.

EXAM:
DIGITAL DIAGNOSTIC UNILATERAL LEFT MAMMOGRAM WITH TOMOSYNTHESIS AND
CAD; ULTRASOUND LEFT BREAST LIMITED
TECHNIQUE: Left digital diagnostic mammography and breast tomosynthesis was
performed. The images were evaluated with computer-aided detection.;
Targeted ultrasound examination of the left breast was performed.

[Series 1: us breast*left* limited inc axilla · 0.06mm/px · 6 of 6 slices shown]
[im 1/6]
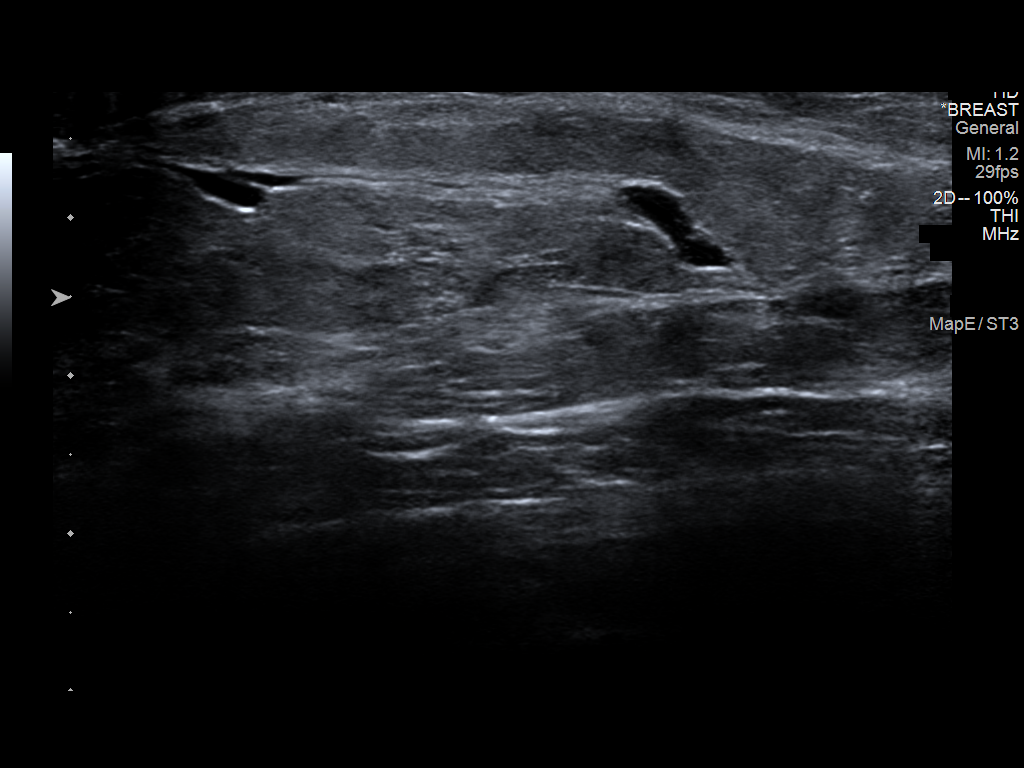
[im 2/6]
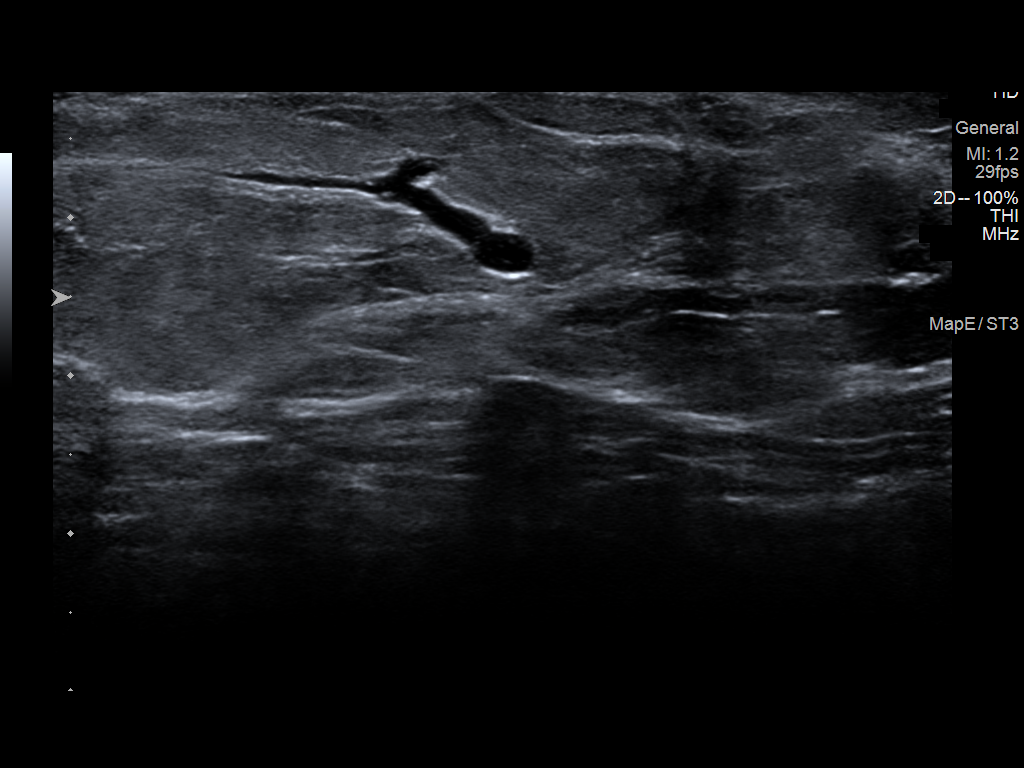
[im 3/6]
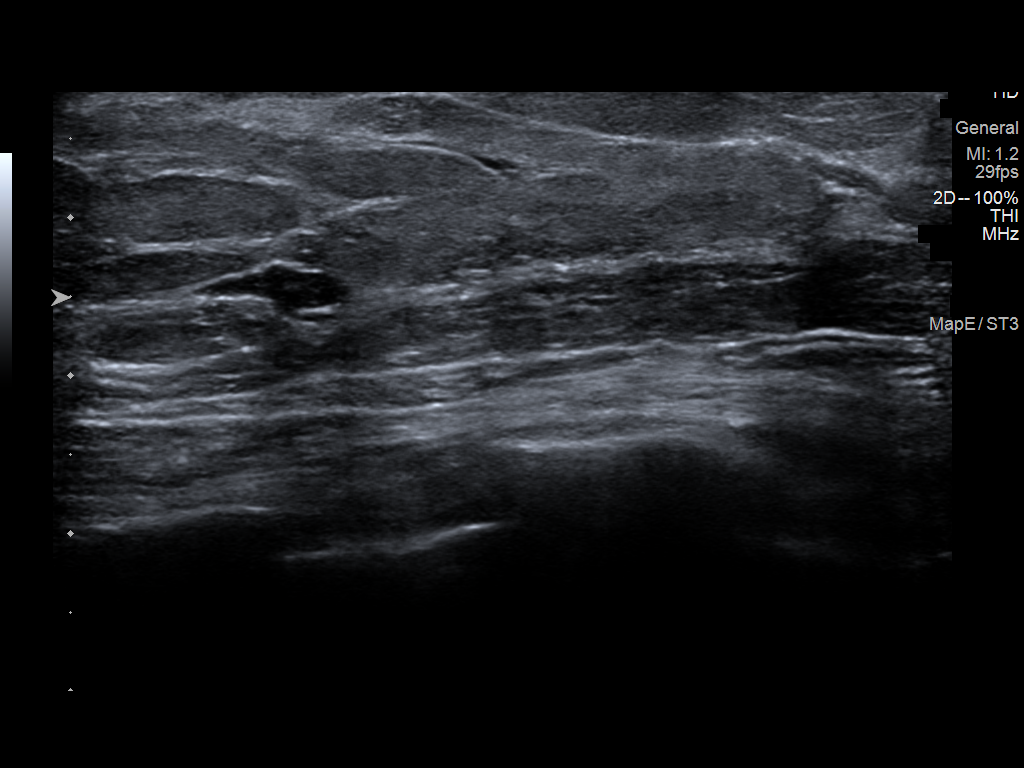
[im 4/6]
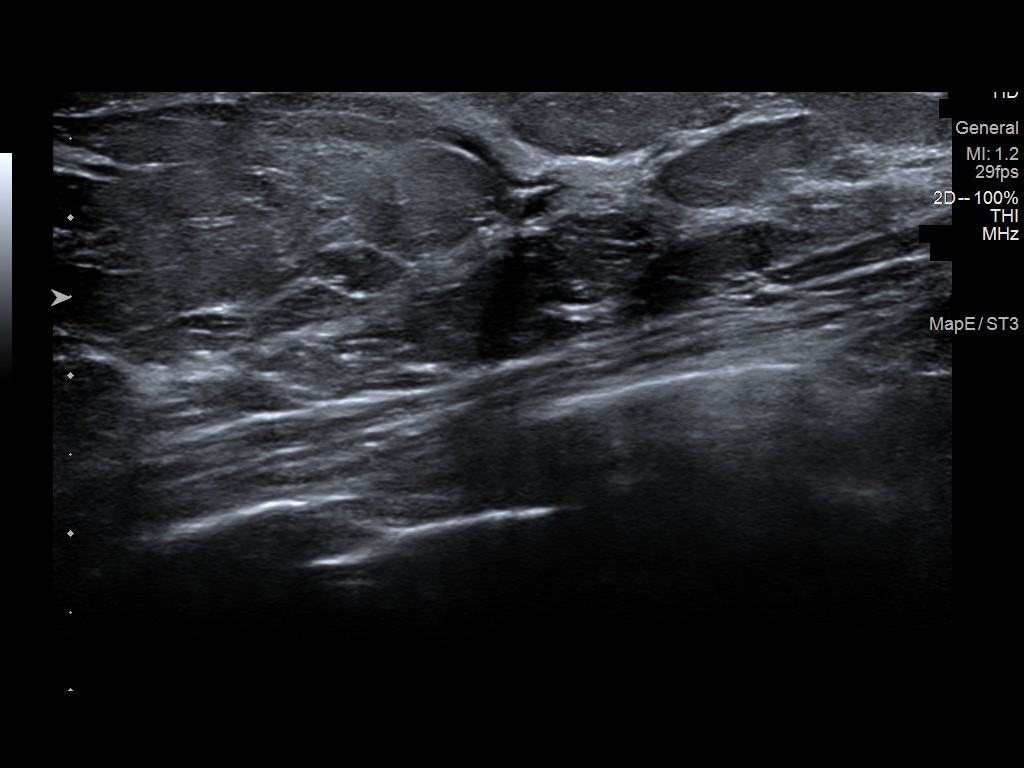
[im 5/6]
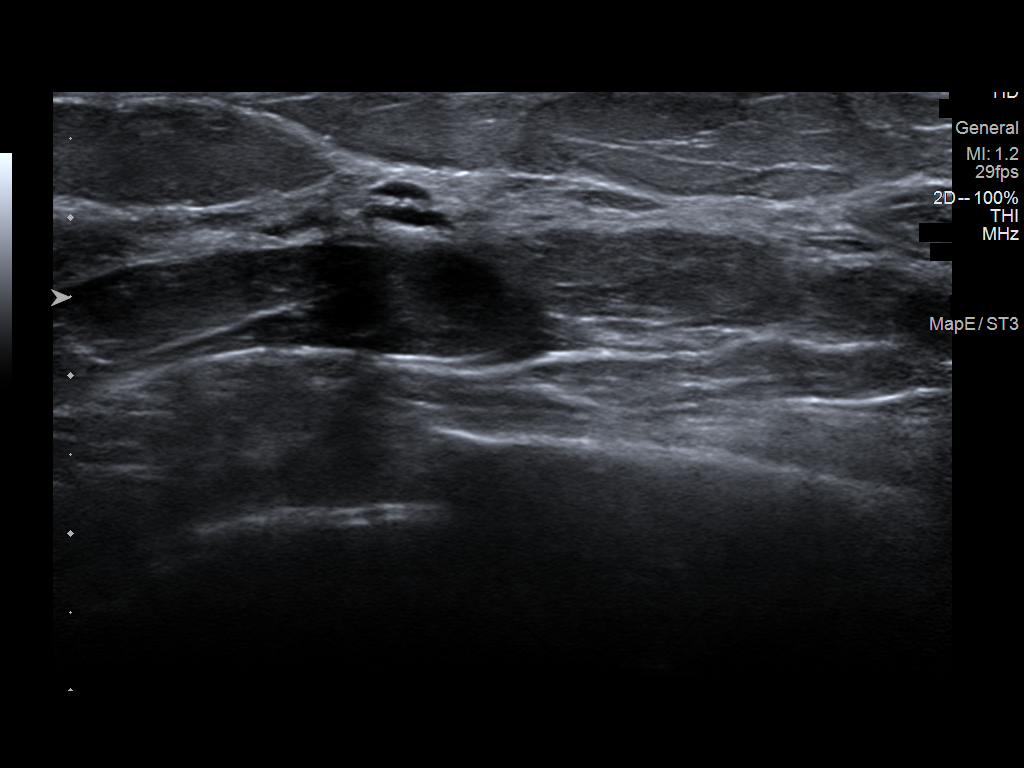
[im 6/6]
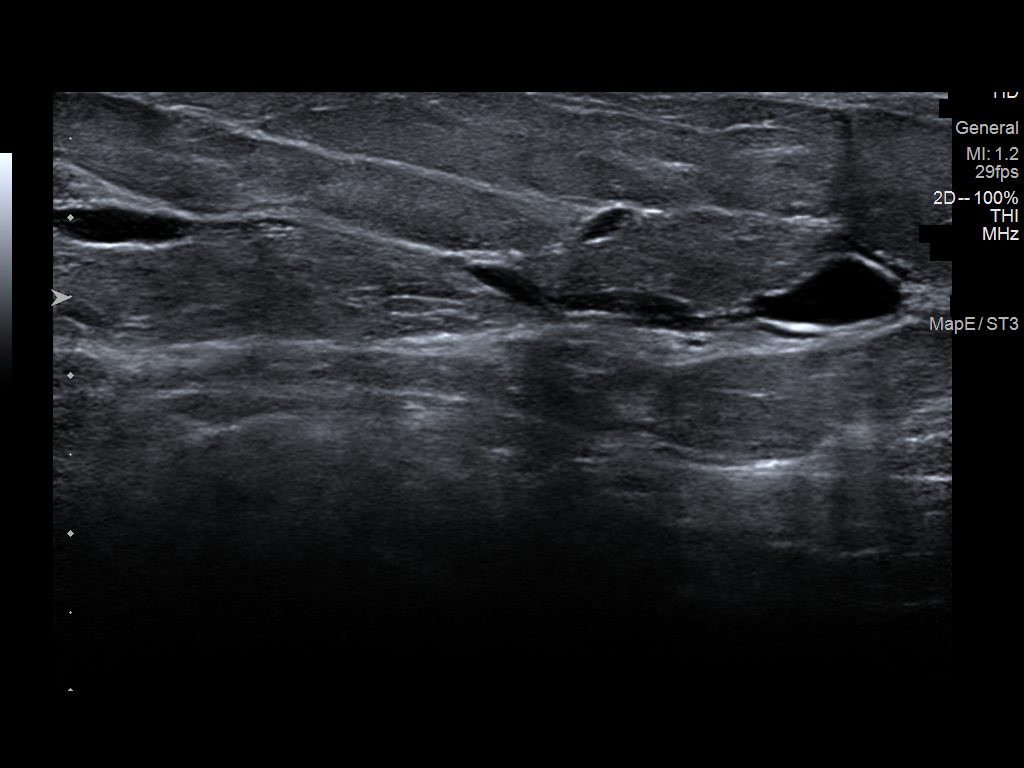

[6 of 6 positions shown; findings below may reference images not displayed]

ACR Breast Density Category b: There are scattered areas of
fibroglandular density.
FINDINGS: Additional 2-D and 3-D images are performed. These views confirm
presence of a circumscribed oval mass in the LOWER central LEFT
breast, best seen on the oblique projection.

Targeted ultrasound is performed, showing multiple ducts with focal
areas of dilatation in the 3-6 o'clock locations. No intraductal
mass identified. No solid mass identified.
IMPRESSION: Minimally dilated ducts in the LOWER portion of the LEFT breast. No
mammographic or ultrasound evidence for malignancy.

RECOMMENDATION:
Screening mammogram in one year.(Code:E4-C-EZD)

I have discussed the findings and recommendations with the patient.
If applicable, a reminder letter will be sent to the patient
regarding the next appointment.

BI-RADS CATEGORY  2: Benign.

## 2023-08-28 IMAGING — MG MM DIGITAL DIAGNOSTIC UNILAT*L* W/ TOMO W/ CAD
6 of 10 series · 6 of 30 positions shown · non-contrast
Comparison: Previous exam(s).

CLINICAL DATA: Patient returns after screening study for evaluation
possible LEFT breast mass.

EXAM:
DIGITAL DIAGNOSTIC UNILATERAL LEFT MAMMOGRAM WITH TOMOSYNTHESIS AND
CAD; ULTRASOUND LEFT BREAST LIMITED
TECHNIQUE: Left digital diagnostic mammography and breast tomosynthesis was
performed. The images were evaluated with computer-aided detection.;
Targeted ultrasound examination of the left breast was performed.

[L CC synth-2D (1 of 3)]
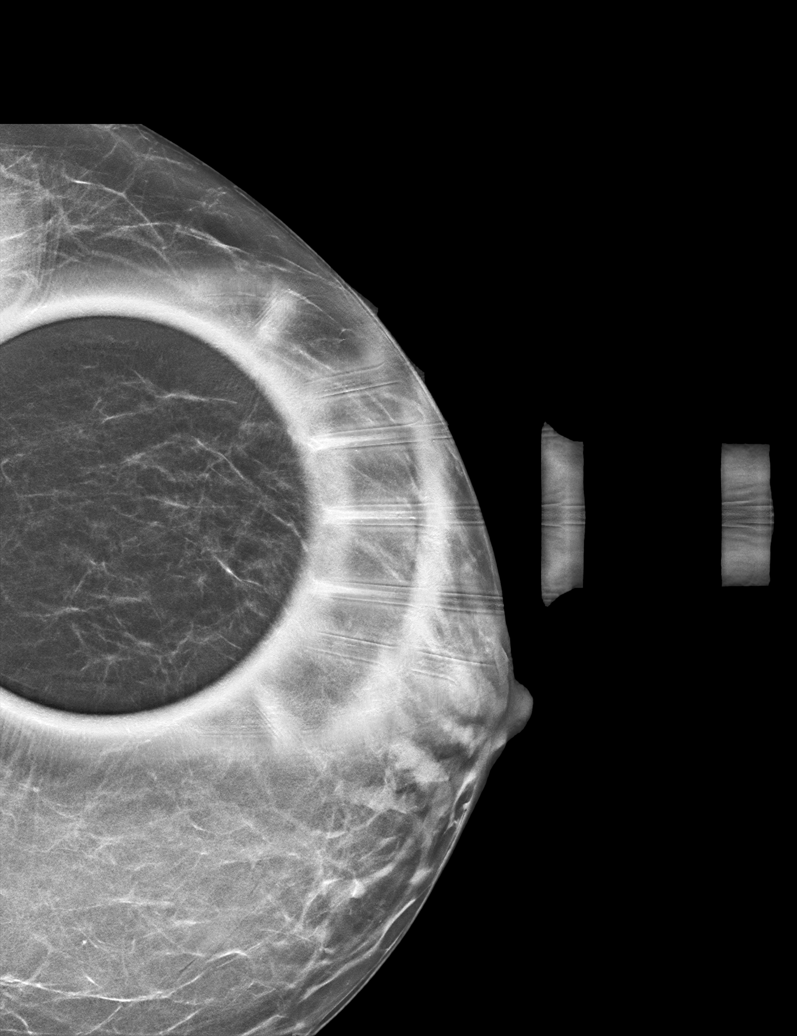

[L CC synth-2D (2 of 3)]
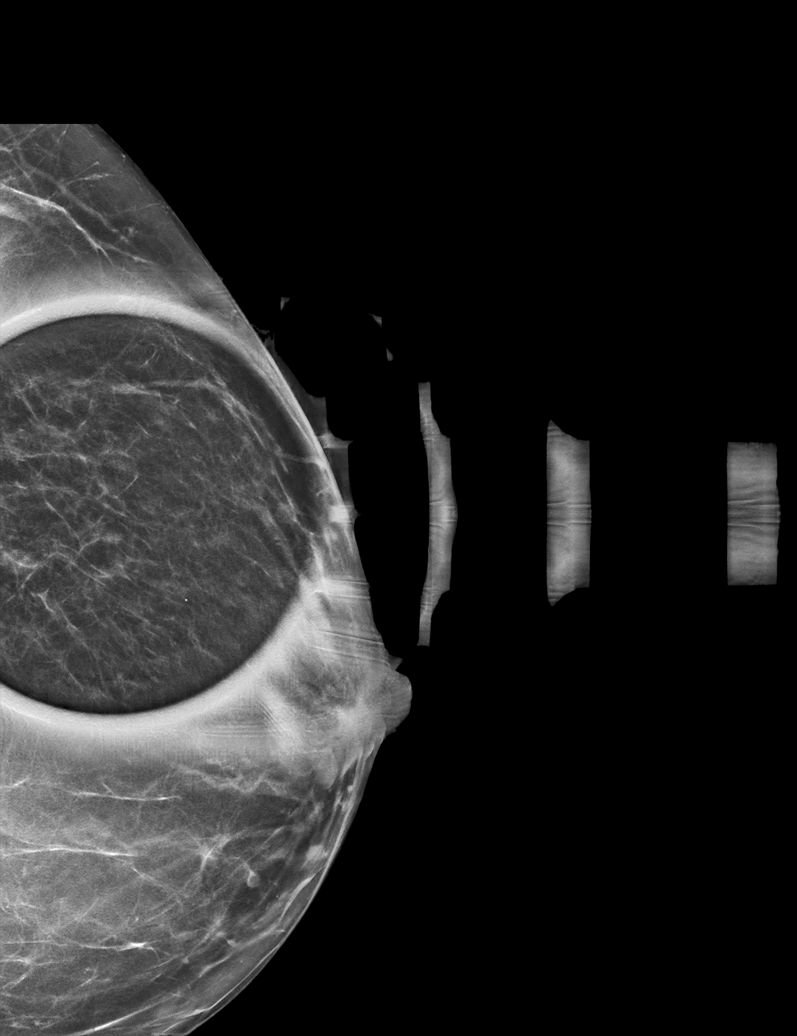

[L MLO synth-2D (1 of 2)]
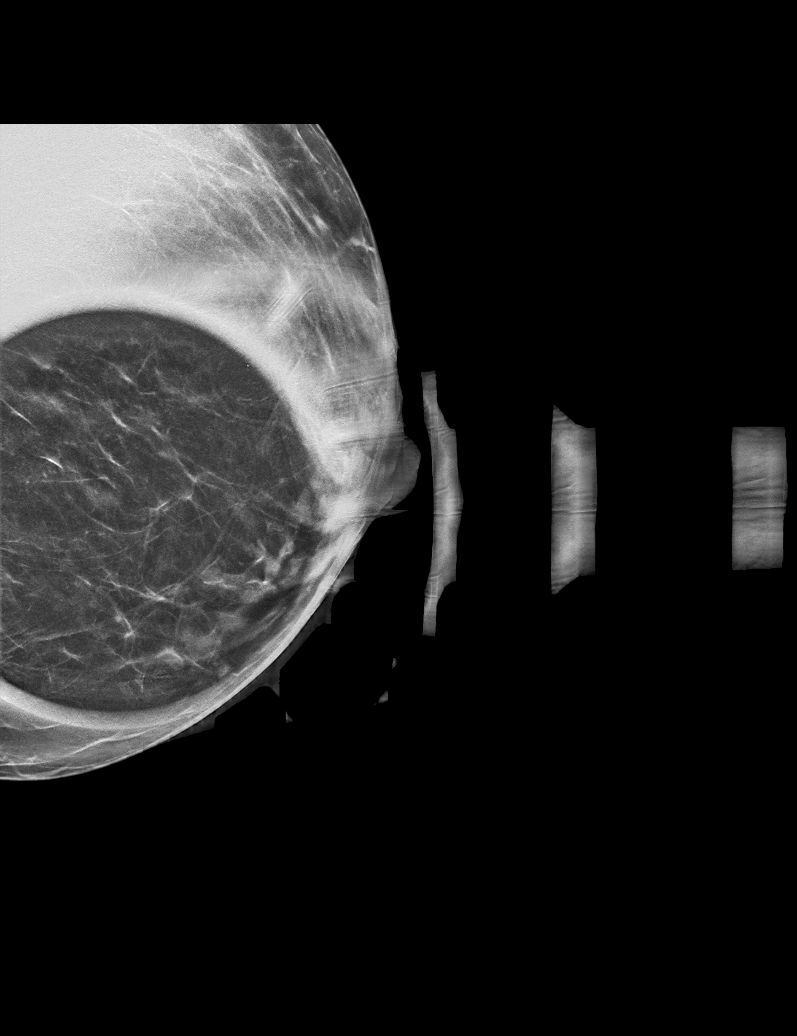

[L MLO synth-2D (2 of 2)]
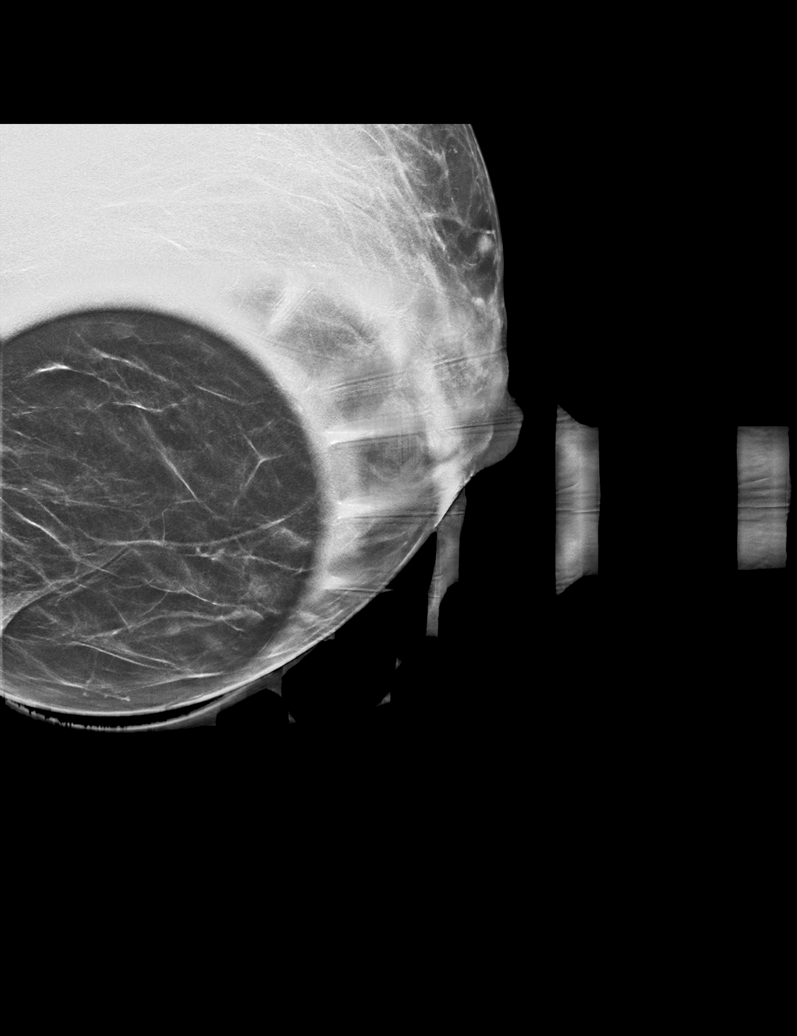

[L CC synth-2D (3 of 3)]
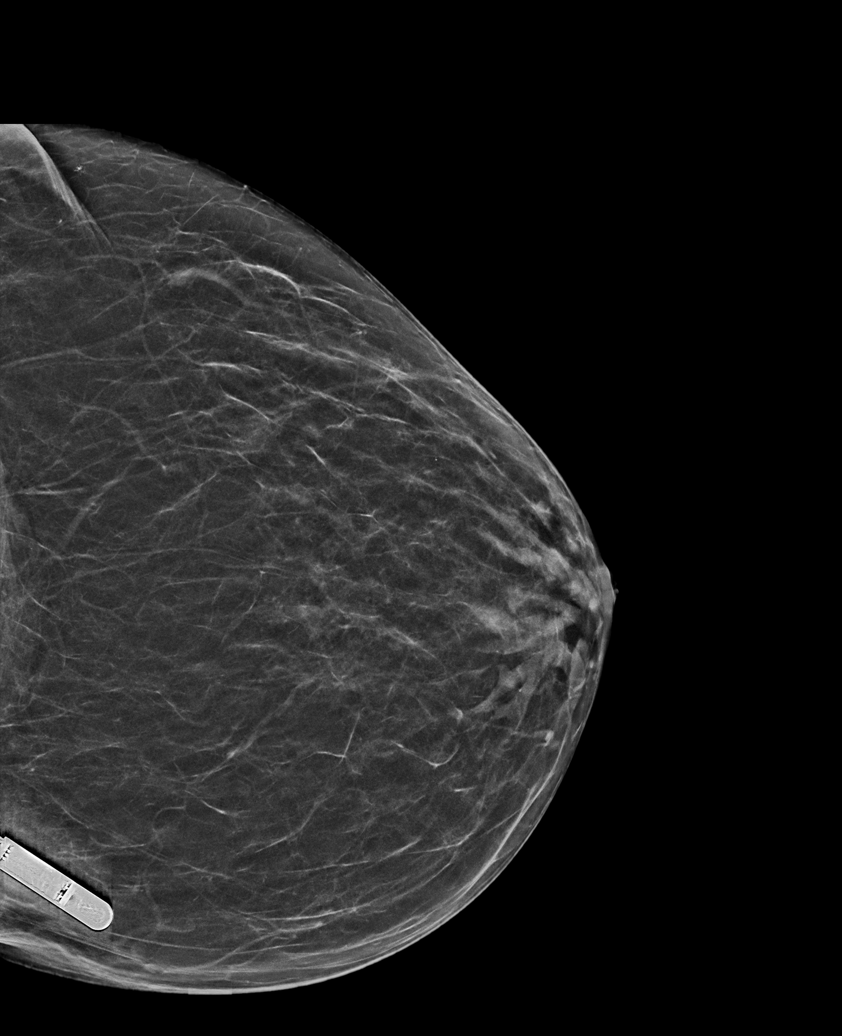

[L CC tomo · tomo slice 29/58.0]
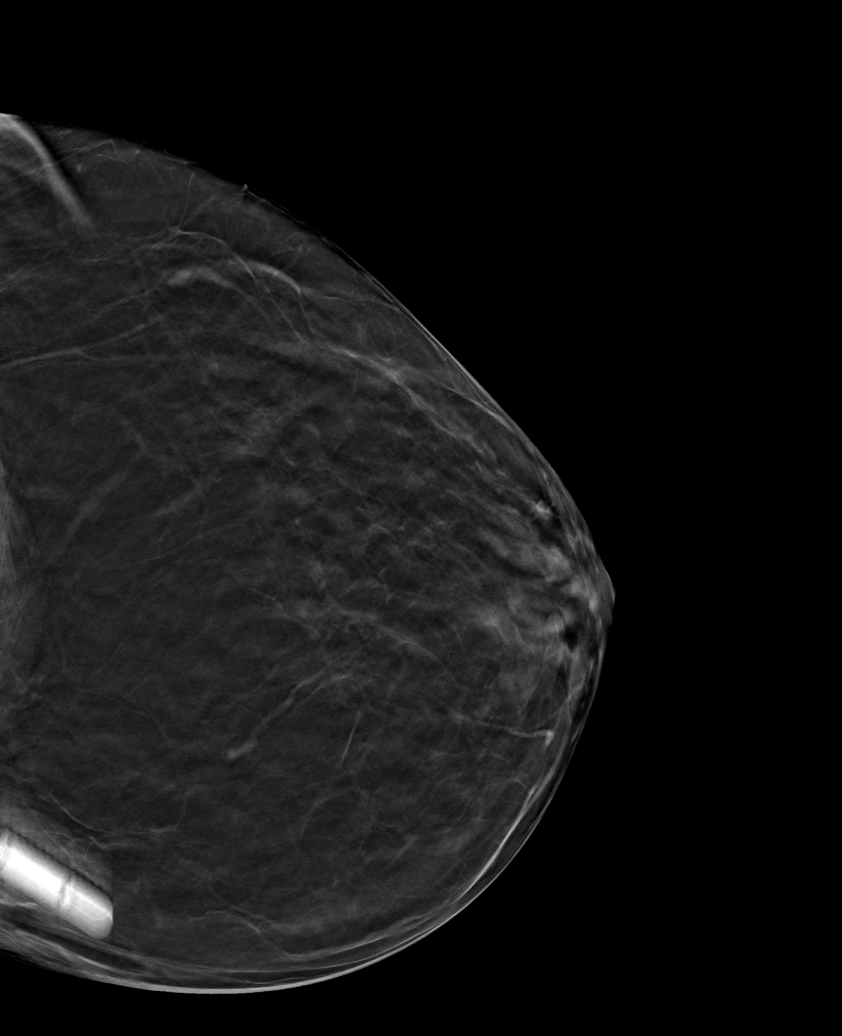

[6 of 30 positions shown; findings below may reference images not displayed]

ACR Breast Density Category b: There are scattered areas of
fibroglandular density.
FINDINGS: Additional 2-D and 3-D images are performed. These views confirm
presence of a circumscribed oval mass in the LOWER central LEFT
breast, best seen on the oblique projection.

Targeted ultrasound is performed, showing multiple ducts with focal
areas of dilatation in the 3-6 o'clock locations. No intraductal
mass identified. No solid mass identified.
IMPRESSION: Minimally dilated ducts in the LOWER portion of the LEFT breast. No
mammographic or ultrasound evidence for malignancy.

RECOMMENDATION:
Screening mammogram in one year.(Code:E4-C-EZD)

I have discussed the findings and recommendations with the patient.
If applicable, a reminder letter will be sent to the patient
regarding the next appointment.

BI-RADS CATEGORY  2: Benign.

## 2023-09-02 ENCOUNTER — Other Ambulatory Visit (HOSPITAL_COMMUNITY): Payer: Self-pay | Admitting: Nurse Practitioner

## 2023-09-22 ENCOUNTER — Other Ambulatory Visit: Payer: Self-pay

## 2023-09-22 NOTE — Progress Notes (Signed)
Specialty Pharmacy Ongoing Clinical Assessment Note  Abigail Medina is a 57 y.o. female who is being followed by the specialty pharmacy service for RxSp HIV   Patient's specialty medication(s) reviewed today: Dolutegravir Sodium; Emtricitabine-Tenofovir Af   Missed doses in the last 4 weeks: 0   Patient did not have any additional questions or concerns.   Therapeutic benefit summary: Patient is achieving benefit   Adverse events/side effects summary: No adverse events/side effects   Patient's therapy is appropriate to: Continue    Goals Addressed             This Visit's Progress    Achieve Undetectable HIV Viral Load < 20       Patient is on track. Patient will maintain adherence         Follow up:  6 months

## 2023-09-22 NOTE — Progress Notes (Signed)
Specialty Pharmacy Refill Coordination Note  Paitlyn Mcclatchey is a 57 y.o. female contacted today regarding refills of specialty medication(s) Dolutegravir Sodium; Emtricitabine-Tenofovir Af   Patient requested Delivery   Delivery date: 09/30/23   Verified address: [address]   Medication will be filled on 09/29/23.

## 2023-10-24 ENCOUNTER — Other Ambulatory Visit: Payer: Self-pay

## 2023-10-24 NOTE — Progress Notes (Signed)
Specialty Pharmacy Refill Coordination Note  Abigail Medina is a 57 y.o. female contacted today regarding refills of specialty medication(s) Dolutegravir Sodium; Emtricitabine-Tenofovir Af   Patient requested Delivery   Delivery date: 11/05/23   Verified address: [address]   Medication will be filled on 11/04/23.

## 2023-11-04 ENCOUNTER — Other Ambulatory Visit: Payer: Self-pay

## 2023-12-02 ENCOUNTER — Other Ambulatory Visit: Payer: Self-pay

## 2023-12-03 ENCOUNTER — Other Ambulatory Visit: Payer: Self-pay

## 2023-12-03 NOTE — Progress Notes (Signed)
Specialty Pharmacy Refill Coordination Note  Abigail Medina is a 57 y.o. female contacted today regarding refills of specialty medication(s) Dolutegravir Sodium (Tivicay); Emtricitabine-Tenofovir AF (Descovy)   Patient requested Delivery   Delivery date: 12/09/23   Verified address: [address]   [place] [address]   Medication will be filled on 12/08/23.

## 2023-12-04 ENCOUNTER — Ambulatory Visit (INDEPENDENT_AMBULATORY_CARE_PROVIDER_SITE_OTHER): Payer: 59 | Admitting: Internal Medicine

## 2023-12-04 ENCOUNTER — Other Ambulatory Visit: Payer: Self-pay

## 2023-12-04 ENCOUNTER — Encounter: Payer: Self-pay | Admitting: Internal Medicine

## 2023-12-04 ENCOUNTER — Other Ambulatory Visit (HOSPITAL_COMMUNITY): Payer: Self-pay

## 2023-12-04 VITALS — BP 127/88 | HR 84 | Temp 97.9°F | Ht 63.0 in | Wt 143.0 lb

## 2023-12-04 DIAGNOSIS — Z23 Encounter for immunization: Secondary | ICD-10-CM | POA: Diagnosis not present

## 2023-12-04 DIAGNOSIS — E876 Hypokalemia: Secondary | ICD-10-CM | POA: Diagnosis not present

## 2023-12-04 DIAGNOSIS — Z872 Personal history of diseases of the skin and subcutaneous tissue: Secondary | ICD-10-CM

## 2023-12-04 DIAGNOSIS — B20 Human immunodeficiency virus [HIV] disease: Secondary | ICD-10-CM | POA: Diagnosis not present

## 2023-12-04 MED ORDER — TIVICAY 50 MG PO TABS: 50.0000 mg | ORAL_TABLET | Freq: Every day | ORAL | 5 refills | Status: DC

## 2023-12-04 MED ORDER — DESCOVY 200-25 MG PO TABS
1.0000 | ORAL_TABLET | Freq: Every day | ORAL | 5 refills | Status: DC
  Filled 2023-12-04: qty 30, 30d supply, fill #0
  Filled 2023-12-26: qty 30, 30d supply, fill #1
  Filled 2024-01-22: qty 30, 30d supply, fill #2
  Filled 2024-03-05: qty 30, 30d supply, fill #3
  Filled 2024-04-20: qty 30, 30d supply, fill #4
  Filled 2024-05-17: qty 30, 30d supply, fill #5

## 2023-12-04 MED ORDER — DESCOVY 200-25 MG PO TABS: 1.0000 | ORAL_TABLET | Freq: Every day | ORAL | 5 refills | Status: DC

## 2023-12-04 MED ORDER — TIVICAY 50 MG PO TABS
50.0000 mg | ORAL_TABLET | Freq: Every day | ORAL | 5 refills | Status: DC
  Filled 2023-12-04: qty 30, 30d supply, fill #0
  Filled 2023-12-26: qty 30, 30d supply, fill #1
  Filled 2024-01-22: qty 30, 30d supply, fill #2
  Filled 2024-03-05: qty 30, 30d supply, fill #3
  Filled 2024-04-20: qty 30, 30d supply, fill #4
  Filled 2024-05-17: qty 30, 30d supply, fill #5

## 2023-12-04 NOTE — Progress Notes (Signed)
Patient ID: Abigail Medina, female   DOB: October 21, 1966, 57 y.o.   MRN: [id number]  HPI The patient, with a known history of HIV, presents with recent episodes of unexplained swelling. They experienced a sudden onset of left foot swelling, which was associated with a small scratch. The swelling was severe enough to limit ambulation. They sought care at an urgent care center, where a possible infection was suspected. The swelling subsequently subsided, but the foot remains slightly sore.  In addition to the foot swelling, the patient also experienced facial swelling around the mouth. This occurred on the day after Thanksgiving, coinciding with the onset of the foot swelling. Despite these episodes, the patient reports feeling generally well.  The patient is on a regimen of Tivicay and Descovy for HIV management, which they report taking regularly. They also report taking potassium supplements, as indicated by a history of low potassium levels. They have been up-to-date with routine health screenings, including a mammogram and Pap smear conducted within the year. The patient declined the COVID vaccine but agreed to receive the flu vaccine.  Outpatient Encounter Medications as of 12/04/2023  Medication Sig   albuterol (VENTOLIN HFA) 108 (90 Base) MCG/ACT inhaler Inhale 2 puffs into the lungs every 4 (four) hours as needed for wheezing or shortness of breath (cough, shortness of breath or wheezing.).   amLODipine (NORVASC) 10 MG tablet TAKE 1 TABLET BY MOUTH EVERY DAY   atorvastatin (LIPITOR) 40 MG tablet TAKE 1 TABLET BY MOUTH EVERY DAY   cetirizine (ZYRTEC) 10 MG tablet TAKE 1 TABLET BY MOUTH EVERY DAY   clopidogrel (PLAVIX) 75 MG tablet TAKE 1 TABLET BY MOUTH EVERY DAY   dolutegravir (TIVICAY) 50 MG tablet Take 1 tablet (50 mg total) by mouth daily.   emtricitabine-tenofovir AF (DESCOVY) 200-25 MG tablet Take 1 tablet by mouth daily.   fluticasone (FLONASE) 50 MCG/ACT nasal spray SPRAY 2  SPRAYS INTO EACH NOSTRIL EVERY DAY   pantoprazole (PROTONIX) 40 MG tablet TAKE 1 TABLET BY MOUTH EVERY DAY   PARoxetine (PAXIL) 10 MG tablet TAKE 1 TABLET BY MOUTH EVERY DAY   potassium chloride SA (KLOR-CON M) 20 MEQ tablet Take 1 tablet (20 mEq total) by mouth daily.   traZODone (DESYREL) 50 MG tablet TAKE 1 TABLET BY MOUTH EVERYDAY AT BEDTIME   triamcinolone ointment (KENALOG) 0.5 % Apply 1 application topically 2 (two) times daily.   Vitamin D, Ergocalciferol, (DRISDOL) 1.25 MG (50000 UNIT) CAPS capsule TAKE 1 CAPSULE (50,000 UNITS TOTAL) BY MOUTH EVERY 7 (SEVEN) DAYS. WEEKLY   cimetidine (TAGAMET) 200 MG tablet Take 1 tablet (200 mg total) by mouth 2 (two) times daily.   hydrochlorothiazide (HYDRODIURIL) 25 MG tablet TAKE 1 TABLET (25 MG TOTAL) BY MOUTH DAILY.   meloxicam (MOBIC) 7.5 MG tablet Take 1 tablet (7.5 mg total) by mouth daily. (Patient not taking: Reported on 12/04/2023)   No facility-administered encounter medications on file as of 12/04/2023.     Patient Active Problem List   Diagnosis Date Noted   Perianal venereal warts 05/15/2023   Screening for cervical cancer 05/15/2023   Costochondral chest pain 11/28/2022   Alcohol dependence with uncomplicated intoxication (HCC) 02/04/2022   Left-sided weakness 09/07/2021   Paresthesia 09/06/2021   Healthcare maintenance 04/13/2019   Aneurysm of thoracic aorta (HCC) 03/29/2019   Cerebral thrombosis with cerebral infarction 03/28/2019   CVA (cerebral vascular accident) (HCC) 03/28/2019   Acute focal neurological deficit 03/27/2019   Hyperbilirubinemia 03/27/2019   History of CVA  with residual deficit    Pain of back and left lower extremity 12/15/2018   Ulnar neuritis 06/22/2015   Tobacco dependence 06/02/2015   Numbness and tingling of right arm 05/30/2015   Right arm weakness 05/30/2015   Arm numbness left 10/10/2014   Vitamin D deficiency 08/05/2014   Allergic rhinitis 05/25/2013   Nasal septal perforation 05/25/2013    Polypharmacy 08/20/2012   HLD (hyperlipidemia) 07/15/2012   BP (high blood pressure) 07/15/2012   Cerebrovascular accident, late effects 01/30/2012   Depression with anxiety 07/15/2011   Hemiplegia, late effect of cerebrovascular disease (HCC) 01/25/2011   Benign neoplasm of skin 08/29/2010   Human immunodeficiency virus (HIV) disease (HCC) 08/06/2007   Essential hypertension 08/06/2007     Health Maintenance Due  Topic Date Due   Medicare Annual Wellness (AWV)  Never done   COVID-19 Vaccine (1) Never done   Zoster Vaccines- Shingrix (1 of 2) Never done   INFLUENZA VACCINE  07/17/2023     Review of Systems 12 point ros is negative except what is mentioned above Physical Exam   BP 127/88   Pulse 84   Temp 97.9 F (36.6 C) (Temporal)   Ht 5\' 3"  (1.6 m)   Wt 143 lb (64.9 kg)   LMP 08/04/2014   SpO2 99%   BMI 25.33 kg/m   Physical Exam  Constitutional:  oriented to person, place, and time. appears well-developed and well-nourished. No distress.  HENT: [place]/AT, PERRLA, no scleral icterus Mouth/Throat: Oropharynx is clear and moist. No oropharyngeal exudate.  Cardiovascular: Normal rate, regular rhythm and normal heart sounds. Exam reveals no gallop and no friction rub.  No murmur heard.  Pulmonary/Chest: Effort normal and breath sounds normal. No respiratory distress.  has no wheezes.  Neck = supple, no nuchal rigidity Abdominal: Soft. Bowel sounds are normal.  exhibits no distension. There is no tenderness.  Lymphadenopathy: no cervical adenopathy. No axillary adenopathy Neurological: alert and oriented to person, place, and time.  Skin: Skin is warm and dry. No rash noted. No erythema.  Psychiatric: a normal mood and affect.  behavior is normal.   Lab Results  Component Value Date   CD4TCELL 30 (L) 11/28/2022   Lab Results  Component Value Date   CD4TABS 386 (L) 11/28/2022   CD4TABS 536 11/26/2021   CD4TABS 456 04/17/2021   Lab Results  Component Value Date    HIV1RNAQUANT Not Detected 11/28/2022   Lab Results  Component Value Date   HEPBSAB NEG 03/10/2014   Lab Results  Component Value Date   LABRPR NON-REACTIVE 04/17/2021    CBC Lab Results  Component Value Date   WBC 6.0 11/28/2022   RBC 4.67 11/28/2022   HGB 14.9 11/28/2022   HCT 41.2 11/28/2022   PLT 206 11/28/2022   MCV 88.2 11/28/2022   MCH 31.9 11/28/2022   MCHC 36.2 (H) 11/28/2022   RDW 11.8 11/28/2022   LYMPHSABS 1.7 09/08/2021   MONOABS 0.4 09/08/2021   EOSABS 0.1 09/08/2021    BMET Lab Results  Component Value Date   NA 141 11/28/2022   K 3.0 (L) 11/28/2022   CL 97 (L) 11/28/2022   CO2 32 11/28/2022   GLUCOSE 109 (H) 11/28/2022   BUN 13 11/28/2022   CREATININE 0.99 11/28/2022   CALCIUM 9.0 11/28/2022   GFRNONAA >60 09/08/2021   GFRAA 94 10/18/2020   LABS Potassium: low  RADIOLOGY Mammogram: normal (2024)  PATHOLOGY Pap smear: normal (2024)   Assessment and Plan HIV Well-managed on Tivicay and  Descovy. No new symptoms. Lab work shows hypokalemia, currently on potassium supplements. Emphasized adherence to supplements to prevent complications like muscle weakness and arrhythmias. - Refill Tivicay and Descovy - will check hiv labs and kidney function and cbc  hypokalemia - Order lab work to monitor potassium levels and overall health - Continue potassium supplements, and consider eating a banana a day  Long term medication management =  will check labs to see that she is stable  Cellulitis Swelling in left foot and left side of face, likely cellulitis. Symptoms improved, but left foot remains slightly sore. Discussed recurrence risk and monitoring for infection signs. - Advise use of ACE bandage for support and pressure reduction on left foot - Monitor for recurrence of swelling or infection signs  General Health Maintenance Up to date with mammogram and Pap smear. Declined COVID vaccine, needs flu vaccine. - Administer flu vaccine - Continue  routine health screenings  Follow-up - Schedule follow-up appointment after lab results are available.

## 2023-12-05 LAB — T-HELPER CELL (CD4) - (RCID CLINIC ONLY)
CD4 % Helper T Cell: 40 % (ref 33–65)
CD4 T Cell Abs: 416 /uL (ref 400–1790)

## 2023-12-07 LAB — HIV-1 RNA QUANT-NO REFLEX-BLD
HIV 1 RNA Quant: NOT DETECTED {copies}/mL
HIV-1 RNA Quant, Log: NOT DETECTED {Log}

## 2023-12-07 LAB — CBC WITH DIFFERENTIAL/PLATELET
Absolute Lymphocytes: 1072 {cells}/uL (ref 850–3900)
Absolute Monocytes: 258 {cells}/uL (ref 200–950)
Basophils Absolute: 20 {cells}/uL (ref 0–200)
Basophils Relative: 0.7 %
Eosinophils Absolute: 59 {cells}/uL (ref 15–500)
Eosinophils Relative: 2.1 %
HCT: 45.6 % — ABNORMAL HIGH (ref 35.0–45.0)
Hemoglobin: 15.7 g/dL — ABNORMAL HIGH (ref 11.7–15.5)
MCH: 31.3 pg (ref 27.0–33.0)
MCHC: 34.4 g/dL (ref 32.0–36.0)
MCV: 91 fL (ref 80.0–100.0)
MPV: 10.8 fL (ref 7.5–12.5)
Monocytes Relative: 9.2 %
Neutro Abs: 1392 {cells}/uL — ABNORMAL LOW (ref 1500–7800)
Neutrophils Relative %: 49.7 %
Platelets: 112 10*3/uL — ABNORMAL LOW (ref 140–400)
RBC: 5.01 10*6/uL (ref 3.80–5.10)
RDW: 12.1 % (ref 11.0–15.0)
Total Lymphocyte: 38.3 %
WBC: 2.8 10*3/uL — ABNORMAL LOW (ref 3.8–10.8)

## 2023-12-07 LAB — LIPID PANEL
Cholesterol: 148 mg/dL (ref <200)
HDL: 64 mg/dL (ref >=50)
LDL Cholesterol (Calc): 70 mg/dL
Non-HDL Cholesterol (Calc): 84 mg/dL (ref <130)
Total CHOL/HDL Ratio: 2.3 (calc) (ref <5.0)
Triglycerides: 56 mg/dL (ref <150)

## 2023-12-07 LAB — COMPLETE METABOLIC PANEL WITH GFR
AG Ratio: 1.5 (calc) (ref 1.0–2.5)
ALT: 33 U/L — ABNORMAL HIGH (ref 6–29)
AST: 24 U/L (ref 10–35)
Albumin: 4.2 g/dL (ref 3.6–5.1)
Alkaline phosphatase (APISO): 82 U/L (ref 37–153)
BUN: 11 mg/dL (ref 7–25)
CO2: 32 mmol/L (ref 20–32)
Calcium: 9.4 mg/dL (ref 8.6–10.4)
Chloride: 99 mmol/L (ref 98–110)
Creat: 0.76 mg/dL (ref 0.50–1.03)
Globulin: 2.8 g/dL (ref 1.9–3.7)
Glucose, Bld: 105 mg/dL — ABNORMAL HIGH (ref 65–99)
Potassium: 3 mmol/L — ABNORMAL LOW (ref 3.5–5.3)
Sodium: 140 mmol/L (ref 135–146)
Total Bilirubin: 1.1 mg/dL (ref 0.2–1.2)
Total Protein: 7 g/dL (ref 6.1–8.1)
eGFR: 90 mL/min/{1.73_m2} (ref >=60)

## 2023-12-07 LAB — RPR: RPR Ser Ql: NONREACTIVE

## 2023-12-08 ENCOUNTER — Other Ambulatory Visit: Payer: Self-pay

## 2023-12-26 ENCOUNTER — Other Ambulatory Visit: Payer: Self-pay

## 2023-12-26 NOTE — Progress Notes (Signed)
 Specialty Pharmacy Refill Coordination Note  Abigail Medina is a 58 y.o. female contacted today regarding refills of specialty medication(s) Dolutegravir  Sodium (Tivicay ); Emtricitabine -Tenofovir  AF (Descovy )   Patient requested Delivery   Delivery date: 01/05/24   Verified address: [address]   [place] [address]   Medication will be filled on 01/02/24.

## 2024-01-02 ENCOUNTER — Other Ambulatory Visit: Payer: Self-pay

## 2024-01-21 ENCOUNTER — Other Ambulatory Visit (HOSPITAL_COMMUNITY): Payer: Self-pay

## 2024-01-22 ENCOUNTER — Other Ambulatory Visit (HOSPITAL_COMMUNITY): Payer: Self-pay

## 2024-01-22 ENCOUNTER — Other Ambulatory Visit (HOSPITAL_COMMUNITY): Payer: Self-pay | Admitting: Pharmacy Technician

## 2024-01-22 NOTE — Progress Notes (Signed)
 Specialty Pharmacy Refill Coordination Note  Abigail Medina is a 58 y.o. female contacted today regarding refills of specialty medication(s) Dolutegravir  Sodium (Tivicay ); Emtricitabine -Tenofovir  AF (Descovy )   Patient requested Delivery   Delivery date: 02/03/24   Verified address: [address] [place]   Medication will be filled on 02/02/24.

## 2024-02-27 ENCOUNTER — Other Ambulatory Visit: Payer: Self-pay

## 2024-03-01 ENCOUNTER — Other Ambulatory Visit: Payer: Self-pay

## 2024-03-05 ENCOUNTER — Other Ambulatory Visit: Payer: Self-pay

## 2024-03-05 ENCOUNTER — Other Ambulatory Visit (HOSPITAL_COMMUNITY): Payer: Self-pay

## 2024-03-05 NOTE — Progress Notes (Signed)
 Specialty Pharmacy Refill Coordination Note  Abigail Medina is a 58 y.o. female contacted today regarding refills of specialty medication(s) Dolutegravir Sodium (Tivicay); Emtricitabine-Tenofovir AF (Descovy)   Patient requested Delivery   Delivery date: 03/23/24   Verified address: [address] [place] [address]   Medication will be filled on 03/22/24.

## 2024-03-09 ENCOUNTER — Other Ambulatory Visit: Payer: Self-pay | Admitting: Nurse Practitioner

## 2024-03-09 DIAGNOSIS — Z1231 Encounter for screening mammogram for malignant neoplasm of breast: Secondary | ICD-10-CM

## 2024-03-26 ENCOUNTER — Ambulatory Visit
Admission: RE | Admit: 2024-03-26 | Discharge: 2024-03-26 | Disposition: A | Discharge status: Home / Self Care | Source: Ambulatory Visit | Attending: Nurse Practitioner | Admitting: Nurse Practitioner

## 2024-03-26 DIAGNOSIS — Z1231 Encounter for screening mammogram for malignant neoplasm of breast: Secondary | ICD-10-CM

## 2024-04-07 ENCOUNTER — Other Ambulatory Visit: Payer: Self-pay

## 2024-04-08 ENCOUNTER — Other Ambulatory Visit (HOSPITAL_COMMUNITY): Payer: Self-pay

## 2024-04-14 ENCOUNTER — Other Ambulatory Visit: Payer: Self-pay

## 2024-04-20 ENCOUNTER — Other Ambulatory Visit (HOSPITAL_COMMUNITY): Payer: Self-pay

## 2024-04-20 ENCOUNTER — Other Ambulatory Visit: Payer: Self-pay

## 2024-04-20 NOTE — Progress Notes (Signed)
 Specialty Pharmacy Ongoing Clinical Assessment Note  Abigail Medina is a 58 y.o. female who is being followed by the specialty pharmacy service for RxSp HIV   Patient's specialty medication(s) reviewed today: Dolutegravir  Sodium (Tivicay ); Emtricitabine -Tenofovir  AF (Descovy )   Missed doses in the last 4 weeks: 0   Patient/Caregiver did not have any additional questions or concerns.   Therapeutic benefit summary: Patient is achieving benefit   Adverse events/side effects summary: Experienced adverse events/side effects (occasional ringing in the ears, tolerable, unsure if releated to the medication and will discuss with MD at next office visit)   Patient's therapy is appropriate to: Continue    Goals Addressed             This Visit's Progress    Achieve Undetectable HIV Viral Load < 20   On track    Patient is on track. Patient will maintain adherence.  Patient's viral load remains undetectable as of 12/04/23.          Follow up:  6 months  Malachi Screws Specialty Pharmacist

## 2024-04-20 NOTE — Progress Notes (Signed)
 Specialty Pharmacy Refill Coordination Note  Abigail Medina is a 58 y.o. female contacted today regarding refills of specialty medication(s) Dolutegravir  Sodium (Tivicay ); Emtricitabine -Tenofovir  AF (Descovy )   Patient requested Delivery   Delivery date: 04/22/24   Verified address: [address] [place] [address]   Medication will be filled on 04/21/24.

## 2024-04-21 ENCOUNTER — Other Ambulatory Visit: Payer: Self-pay

## 2024-05-14 NOTE — Progress Notes (Signed)
 The ASCVD Risk score (Arnett DK, et al., 2019) failed to calculate for the following reasons:   Risk score cannot be calculated because patient has a medical history suggesting prior/existing ASCVD  Arlon Bergamo, BSN, RN

## 2024-05-17 ENCOUNTER — Other Ambulatory Visit: Payer: Self-pay

## 2024-05-17 ENCOUNTER — Other Ambulatory Visit: Payer: Self-pay | Admitting: Pharmacy Technician

## 2024-05-17 NOTE — Progress Notes (Signed)
 Specialty Pharmacy Refill Coordination Note  Damyah Gugel is a 58 y.o. female contacted today regarding refills of specialty medication(s) Dolutegravir  Sodium (Tivicay ); Emtricitabine -Tenofovir  AF (Descovy )   Patient requested Delivery   Delivery date: 05/20/24   Verified address: [address] [place], [place] [address]   Medication will be filled on 05/19/24.

## 2024-06-09 ENCOUNTER — Ambulatory Visit: Payer: 59 | Admitting: Internal Medicine

## 2024-06-11 ENCOUNTER — Other Ambulatory Visit: Payer: Self-pay

## 2024-06-11 ENCOUNTER — Other Ambulatory Visit: Payer: Self-pay | Admitting: Internal Medicine

## 2024-06-11 DIAGNOSIS — B20 Human immunodeficiency virus [HIV] disease: Secondary | ICD-10-CM

## 2024-06-11 MED ORDER — TIVICAY 50 MG PO TABS
50.0000 mg | ORAL_TABLET | Freq: Every day | ORAL | 1 refills | Status: DC
Start: 2024-06-11 — End: 2024-08-20
  Filled 2024-06-11 – 2024-06-29 (×3): qty 30, 30d supply, fill #0
  Filled 2024-07-23: qty 30, 30d supply, fill #1

## 2024-06-11 MED ORDER — DESCOVY 200-25 MG PO TABS
1.0000 | ORAL_TABLET | Freq: Every day | ORAL | 1 refills | Status: DC
  Filled 2024-06-11 – 2024-06-29 (×3): qty 30, 30d supply, fill #0
  Filled 2024-07-23: qty 30, 30d supply, fill #1

## 2024-06-14 ENCOUNTER — Other Ambulatory Visit: Payer: Self-pay

## 2024-06-16 ENCOUNTER — Other Ambulatory Visit (HOSPITAL_COMMUNITY): Payer: Self-pay

## 2024-06-29 ENCOUNTER — Other Ambulatory Visit (HOSPITAL_COMMUNITY): Payer: Self-pay

## 2024-06-29 ENCOUNTER — Other Ambulatory Visit: Payer: Self-pay

## 2024-06-29 NOTE — Progress Notes (Signed)
 Specialty Pharmacy Refill Coordination Note  Abigail Medina is a 58 y.o. female contacted today regarding refills of specialty medication(s) Dolutegravir  Sodium (Tivicay ); Emtricitabine -Tenofovir  AF (Descovy )   Patient requested Delivery   Delivery date: 07/01/24   Verified address: [address] [place], [place] [address]   Medication will be filled on 06/29/24 or 06/30/24.

## 2024-07-14 ENCOUNTER — Encounter (HOSPITAL_COMMUNITY): Payer: Self-pay | Admitting: Nurse Practitioner

## 2024-07-15 ENCOUNTER — Encounter: Payer: Self-pay | Admitting: Internal Medicine

## 2024-07-15 ENCOUNTER — Other Ambulatory Visit: Payer: Self-pay

## 2024-07-15 ENCOUNTER — Ambulatory Visit: Admitting: Internal Medicine

## 2024-07-15 ENCOUNTER — Other Ambulatory Visit (HOSPITAL_COMMUNITY)
Admission: RE | Admit: 2024-07-15 | Discharge: 2024-07-15 | Disposition: A | Discharge status: Home / Self Care | Source: Ambulatory Visit | Attending: Internal Medicine | Admitting: Internal Medicine

## 2024-07-15 VITALS — BP 119/86 | HR 88 | Temp 98.1°F | Ht 63.0 in | Wt 137.0 lb

## 2024-07-15 DIAGNOSIS — F419 Anxiety disorder, unspecified: Secondary | ICD-10-CM

## 2024-07-15 DIAGNOSIS — A630 Anogenital (venereal) warts: Secondary | ICD-10-CM

## 2024-07-15 DIAGNOSIS — B20 Human immunodeficiency virus [HIV] disease: Secondary | ICD-10-CM

## 2024-07-15 DIAGNOSIS — B3731 Acute candidiasis of vulva and vagina: Secondary | ICD-10-CM | POA: Diagnosis not present

## 2024-07-15 DIAGNOSIS — Z9189 Other specified personal risk factors, not elsewhere classified: Secondary | ICD-10-CM

## 2024-07-15 DIAGNOSIS — L918 Other hypertrophic disorders of the skin: Secondary | ICD-10-CM

## 2024-07-15 LAB — URINE CYTOLOGY ANCILLARY ONLY
Chlamydia: NEGATIVE
Comment: NEGATIVE
Comment: NORMAL
Neisseria Gonorrhea: NEGATIVE

## 2024-07-15 NOTE — Progress Notes (Signed)
 RFV: follow up for hiv disease Patient ID: Abigail Medina, female   DOB: 05/24/1966, 58 y.o.   MRN: [id number]  HPI  Abigail Medina is a 58yoF with well controlled HIV disease, she is on tivicay /descovy . She reports feeling anxious/ Jittery -- improved since stopping coffee. But still occasionally still gets anxious. Staring on atarax , but hasn't been taking paxil .   Outpatient Encounter Medications as of 07/15/2024  Medication Sig   albuterol  (VENTOLIN  HFA) 108 (90 Base) MCG/ACT inhaler Inhale 2 puffs into the lungs every 4 (four) hours as needed for wheezing or shortness of breath (cough, shortness of breath or wheezing.).   amLODipine  (NORVASC ) 10 MG tablet TAKE 1 TABLET BY MOUTH EVERY DAY   atorvastatin  (LIPITOR) 40 MG tablet TAKE 1 TABLET BY MOUTH EVERY DAY   cetirizine  (ZYRTEC ) 10 MG tablet TAKE 1 TABLET BY MOUTH EVERY DAY   cimetidine  (TAGAMET ) 200 MG tablet Take 1 tablet (200 mg total) by mouth 2 (two) times daily.   clopidogrel  (PLAVIX ) 75 MG tablet TAKE 1 TABLET BY MOUTH EVERY DAY   dolutegravir  (TIVICAY ) 50 MG tablet Take 1 tablet (50 mg total) by mouth daily.   emtricitabine -tenofovir  AF (DESCOVY ) 200-25 MG tablet Take 1 tablet by mouth daily.   fluticasone  (FLONASE ) 50 MCG/ACT nasal spray SPRAY 2 SPRAYS INTO EACH NOSTRIL EVERY DAY   hydrochlorothiazide  (HYDRODIURIL ) 25 MG tablet TAKE 1 TABLET (25 MG TOTAL) BY MOUTH DAILY.   hydrOXYzine  (ATARAX ) 10 MG tablet Take 10 mg by mouth 3 (three) times daily as needed.   pantoprazole  (PROTONIX ) 40 MG tablet TAKE 1 TABLET BY MOUTH EVERY DAY   potassium chloride  SA (KLOR-CON  M) 20 MEQ tablet Take 1 tablet (20 mEq total) by mouth daily.   triamcinolone  ointment (KENALOG ) 0.5 % Apply 1 application topically 2 (two) times daily.   Vitamin D , Ergocalciferol , (DRISDOL ) 1.25 MG (50000 UNIT) CAPS capsule TAKE 1 CAPSULE (50,000 UNITS TOTAL) BY MOUTH EVERY 7 (SEVEN) DAYS. WEEKLY   meloxicam  (MOBIC ) 7.5 MG tablet Take 1 tablet (7.5 mg total) by  mouth daily. (Patient not taking: Reported on 07/15/2024)   PARoxetine  (PAXIL ) 10 MG tablet TAKE 1 TABLET BY MOUTH EVERY DAY   traZODone  (DESYREL ) 50 MG tablet TAKE 1 TABLET BY MOUTH EVERYDAY AT BEDTIME (Patient not taking: Reported on 07/15/2024)   No facility-administered encounter medications on file as of 07/15/2024.     Patient Active Problem List   Diagnosis Date Noted   Perianal venereal warts 05/15/2023   Screening for cervical cancer 05/15/2023   Costochondral chest pain 11/28/2022   Alcohol dependence with uncomplicated intoxication (HCC) 02/04/2022   Left-sided weakness 09/07/2021   Paresthesia 09/06/2021   Healthcare maintenance 04/13/2019   Aneurysm of thoracic aorta (HCC) 03/29/2019   Cerebral thrombosis with cerebral infarction 03/28/2019   CVA (cerebral vascular accident) (HCC) 03/28/2019   Acute focal neurological deficit 03/27/2019   Hyperbilirubinemia 03/27/2019   History of CVA with residual deficit    Pain of back and left lower extremity 12/15/2018   Ulnar neuritis 06/22/2015   Tobacco dependence 06/02/2015   Numbness and tingling of right arm 05/30/2015   Right arm weakness 05/30/2015   Arm numbness left 10/10/2014   Vitamin D  deficiency 08/05/2014   Allergic rhinitis 05/25/2013   Nasal septal perforation 05/25/2013   Polypharmacy 08/20/2012   HLD (hyperlipidemia) 07/15/2012   BP (high blood pressure) 07/15/2012   Cerebrovascular accident, late effects 01/30/2012   Depression with anxiety 07/15/2011   Hemiplegia, late effect of cerebrovascular disease (HCC)  01/25/2011   Benign neoplasm of skin 08/29/2010   Human immunodeficiency virus (HIV) disease (HCC) 08/06/2007   Essential hypertension 08/06/2007     Health Maintenance Due  Topic Date Due   Medicare Annual Wellness (AWV)  Never done   COVID-19 Vaccine (1) Never done   Zoster Vaccines- Shingrix (1 of 2) Never done   Pneumococcal Vaccine: 19-49 Years (4 of 4 - PCV20 or PCV21) 04/09/2017    Pneumococcal Vaccine: 50+ Years (4 of 4 - PCV20 or PCV21) 04/09/2017   Cervical Cancer Screening (Pap smear)  05/14/2024     Review of Systems 12 point ros is otherwise negative. Except - she reports vaginal candidiasis.  Physical Exam   BP 119/86   Pulse 88   Temp 98.1 F (36.7 C) (Oral)   Ht 5' 3 (1.6 m)   Wt 137 lb (62.1 kg)   LMP 08/04/2014   SpO2 98%   BMI 24.27 kg/m   Physical Exam  Constitutional:  oriented to person, place, and time. appears well-developed and well-nourished. No distress.  HENT: [place]/AT, PERRLA, no scleral icterus Mouth/Throat: Oropharynx is clear and moist. No oropharyngeal exudate.  Cardiovascular: Normal rate, regular rhythm and normal heart sounds. Exam reveals no gallop and no friction rub.  No murmur heard.  Pulmonary/Chest: Effort normal and breath sounds normal. No respiratory distress.  has no wheezes.  Neck = supple, no nuchal rigidity Abdominal: Soft. Bowel sounds are normal.  exhibits no distension. There is no tenderness.  Lymphadenopathy: no cervical adenopathy. No axillary adenopathy Neurological: alert and oriented to person, place, and time.  Skin: Skin is warm and dry. No rash noted. No erythema.  Psychiatric: a normal mood and affect.  behavior is normal.   Lab Results  Component Value Date   CD4TCELL 40 12/04/2023   Lab Results  Component Value Date   CD4TABS 416 12/04/2023   CD4TABS 386 (L) 11/28/2022   CD4TABS 536 11/26/2021   Lab Results  Component Value Date   HIV1RNAQUANT Not Detected 12/04/2023   Lab Results  Component Value Date   HEPBSAB NEG 03/10/2014   Lab Results  Component Value Date   LABRPR NON-REACTIVE 12/04/2023    CBC Lab Results  Component Value Date   WBC 2.8 (L) 12/04/2023   RBC 5.01 12/04/2023   HGB 15.7 (H) 12/04/2023   HCT 45.6 (H) 12/04/2023   PLT 112 (L) 12/04/2023   MCV 91.0 12/04/2023   MCH 31.3 12/04/2023   MCHC 34.4 12/04/2023   RDW 12.1 12/04/2023   LYMPHSABS 1.7 09/08/2021    MONOABS 0.4 09/08/2021   EOSABS 59 12/04/2023    BMET Lab Results  Component Value Date   NA 140 12/04/2023   K 3.0 (L) 12/04/2023   CL 99 12/04/2023   CO2 32 12/04/2023   GLUCOSE 105 (H) 12/04/2023   BUN 11 12/04/2023   CREATININE 0.76 12/04/2023   CALCIUM  9.4 12/04/2023   GFRNONAA >60 09/08/2021   GFRAA 94 10/18/2020      Assessment and Plan  Skin tags, perineum = refer to dermatology for removal  Hiv disease= will check labs. Refill on meds. Anticipate she well controlled. She take tivicay  and descovy  daily  Sexually active == will check rpr and urine gc/chlamydia. Her partner is interested in PrEP. Will arrange further call  ? Vaginal candidiasis = has fluc at home .asked her to take a dose  Anxiety = can start paxil . And also Discussed how to minimize anxiety

## 2024-07-16 LAB — T-HELPER CELL (CD4) - (RCID CLINIC ONLY)
CD4 % Helper T Cell: 41 % (ref 33–65)
CD4 T Cell Abs: 482 /uL (ref 400–1790)

## 2024-07-17 LAB — COMPLETE METABOLIC PANEL WITHOUT GFR
AG Ratio: 1.6 (calc) (ref 1.0–2.5)
ALT: 23 U/L (ref 6–29)
AST: 24 U/L (ref 10–35)
Albumin: 4.4 g/dL (ref 3.6–5.1)
Alkaline phosphatase (APISO): 75 U/L (ref 37–153)
BUN: 13 mg/dL (ref 7–25)
CO2: 29 mmol/L (ref 20–32)
Calcium: 9.4 mg/dL (ref 8.6–10.4)
Chloride: 102 mmol/L (ref 98–110)
Creat: 0.72 mg/dL (ref 0.50–1.05)
Globulin: 2.7 g/dL (ref 1.9–3.7)
Glucose, Bld: 84 mg/dL (ref 65–99)
Potassium: 3.2 mmol/L — ABNORMAL LOW (ref 3.5–5.3)
Sodium: 141 mmol/L (ref 135–146)
Total Bilirubin: 1.4 mg/dL — ABNORMAL HIGH (ref 0.2–1.2)
Total Protein: 7.1 g/dL (ref 6.1–8.1)

## 2024-07-17 LAB — CBC WITH DIFFERENTIAL/PLATELET
Absolute Lymphocytes: 1386 {cells}/uL (ref 850–3900)
Absolute Monocytes: 272 {cells}/uL (ref 200–950)
Basophils Absolute: 19 {cells}/uL (ref 0–200)
Basophils Relative: 0.6 %
Eosinophils Absolute: 51 {cells}/uL (ref 15–500)
Eosinophils Relative: 1.6 %
HCT: 44.9 % (ref 35.0–45.0)
Hemoglobin: 15.6 g/dL — ABNORMAL HIGH (ref 11.7–15.5)
MCH: 32 pg (ref 27.0–33.0)
MCHC: 34.7 g/dL (ref 32.0–36.0)
MCV: 92 fL (ref 80.0–100.0)
MPV: 10.7 fL (ref 7.5–12.5)
Monocytes Relative: 8.5 %
Neutro Abs: 1472 {cells}/uL — ABNORMAL LOW (ref 1500–7800)
Neutrophils Relative %: 46 %
Platelets: 163 Thousand/uL (ref 140–400)
RBC: 4.88 Million/uL (ref 3.80–5.10)
RDW: 12.7 % (ref 11.0–15.0)
Total Lymphocyte: 43.3 %
WBC: 3.2 Thousand/uL — ABNORMAL LOW (ref 3.8–10.8)

## 2024-07-17 LAB — RPR: RPR Ser Ql: NONREACTIVE

## 2024-07-17 LAB — LIPID PANEL
Cholesterol: 168 mg/dL (ref <200)
HDL: 72 mg/dL (ref >=50)
LDL Cholesterol (Calc): 82 mg/dL
Non-HDL Cholesterol (Calc): 96 mg/dL (ref <130)
Total CHOL/HDL Ratio: 2.3 (calc) (ref <5.0)
Triglycerides: 65 mg/dL (ref <150)

## 2024-07-17 LAB — HIV-1 RNA QUANT-NO REFLEX-BLD
HIV 1 RNA Quant: NOT DETECTED {copies}/mL
HIV-1 RNA Quant, Log: NOT DETECTED {Log_copies}/mL

## 2024-07-24 ENCOUNTER — Other Ambulatory Visit (HOSPITAL_COMMUNITY): Payer: Self-pay

## 2024-07-26 ENCOUNTER — Other Ambulatory Visit: Payer: Self-pay

## 2024-07-26 ENCOUNTER — Other Ambulatory Visit (HOSPITAL_COMMUNITY): Payer: Self-pay

## 2024-07-26 NOTE — Progress Notes (Signed)
 Specialty Pharmacy Refill Coordination Note  Abigail Medina is a 58 y.o. female contacted today regarding refills of specialty medication(s) Dolutegravir  Sodium (Tivicay ); Emtricitabine -Tenofovir  AF (Descovy )   Patient requested Delivery   Delivery date: 07/29/24   Verified address: [address] [place], [place] [address]   Medication will be filled on 07/28/24.

## 2024-07-27 ENCOUNTER — Other Ambulatory Visit: Payer: Self-pay

## 2024-08-20 ENCOUNTER — Other Ambulatory Visit: Payer: Self-pay | Admitting: Internal Medicine

## 2024-08-20 ENCOUNTER — Other Ambulatory Visit: Payer: Self-pay

## 2024-08-20 DIAGNOSIS — B20 Human immunodeficiency virus [HIV] disease: Secondary | ICD-10-CM

## 2024-08-20 MED ORDER — TIVICAY 50 MG PO TABS
50.0000 mg | ORAL_TABLET | Freq: Every day | ORAL | 2 refills | Status: DC
  Filled 2024-08-20 – 2024-08-24 (×2): qty 30, 30d supply, fill #0
  Filled 2024-09-21: qty 30, 30d supply, fill #1
  Filled 2024-10-18: qty 30, 30d supply, fill #2

## 2024-08-20 MED ORDER — DESCOVY 200-25 MG PO TABS
1.0000 | ORAL_TABLET | Freq: Every day | ORAL | 2 refills | Status: DC
  Filled 2024-08-20 – 2024-08-24 (×2): qty 30, 30d supply, fill #0
  Filled 2024-09-21: qty 30, 30d supply, fill #1
  Filled 2024-10-18: qty 30, 30d supply, fill #2

## 2024-08-24 ENCOUNTER — Other Ambulatory Visit: Payer: Self-pay

## 2024-08-24 ENCOUNTER — Other Ambulatory Visit: Payer: Self-pay | Admitting: Pharmacy Technician

## 2024-08-24 NOTE — Progress Notes (Signed)
 Specialty Pharmacy Refill Coordination Note  Abigail Medina is a 58 y.o. female contacted today regarding refills of specialty medication(s) Dolutegravir  Sodium (Tivicay ); Emtricitabine -Tenofovir  AF (Descovy )   Patient requested Delivery   Delivery date: 08/26/24   Verified address: [address] [place]   Medication will be filled on 08/25/24.

## 2024-09-16 ENCOUNTER — Other Ambulatory Visit: Payer: Self-pay

## 2024-09-21 ENCOUNTER — Other Ambulatory Visit: Payer: Self-pay

## 2024-09-21 ENCOUNTER — Other Ambulatory Visit: Payer: Self-pay | Admitting: Pharmacy Technician

## 2024-09-21 NOTE — Progress Notes (Signed)
 Specialty Pharmacy Refill Coordination Note  Abigail Medina is a 59 y.o. female contacted today regarding refills of specialty medication(s) Dolutegravir  Sodium (Tivicay ); Emtricitabine -Tenofovir  AF (Descovy )   Patient requested Delivery   Delivery date: 09/23/24   Verified address: [address]   [place]   Medication will be filled on 09/22/24.

## 2024-10-13 ENCOUNTER — Other Ambulatory Visit: Payer: Self-pay

## 2024-10-14 ENCOUNTER — Other Ambulatory Visit: Payer: Self-pay

## 2024-10-14 ENCOUNTER — Other Ambulatory Visit (HOSPITAL_COMMUNITY): Payer: Self-pay

## 2024-10-18 ENCOUNTER — Other Ambulatory Visit: Payer: Self-pay

## 2024-10-18 ENCOUNTER — Other Ambulatory Visit (HOSPITAL_COMMUNITY): Payer: Self-pay

## 2024-10-18 NOTE — Progress Notes (Signed)
 Specialty Pharmacy Refill Coordination Note  Spoke with Alanni Vader  Iisha Soyars is a 58 y.o. female contacted today regarding refills of specialty medication(s) Dolutegravir  Sodium (Tivicay ); Emtricitabine -Tenofovir  AF (Descovy )  Doses on hand: 9  Patient requested: Delivery   Delivery date: 10/22/24   Verified address: [address] [place] [address]  Medication will be filled on 10/21/24 .

## 2024-10-20 ENCOUNTER — Other Ambulatory Visit: Payer: Self-pay

## 2024-11-12 ENCOUNTER — Other Ambulatory Visit (HOSPITAL_COMMUNITY): Payer: Self-pay

## 2024-11-16 ENCOUNTER — Other Ambulatory Visit: Payer: Self-pay

## 2024-11-16 ENCOUNTER — Other Ambulatory Visit: Payer: Self-pay | Admitting: Internal Medicine

## 2024-11-16 ENCOUNTER — Other Ambulatory Visit (HOSPITAL_COMMUNITY): Payer: Self-pay

## 2024-11-16 DIAGNOSIS — B20 Human immunodeficiency virus [HIV] disease: Secondary | ICD-10-CM

## 2024-11-16 MED ORDER — TIVICAY 50 MG PO TABS
50.0000 mg | ORAL_TABLET | Freq: Every day | ORAL | 5 refills | Status: AC
  Filled 2024-11-16 – 2024-11-18 (×2): qty 30, 30d supply, fill #0
  Filled 2024-12-17: qty 30, 30d supply, fill #1
  Filled 2025-01-21: qty 30, 30d supply, fill #2

## 2024-11-16 MED ORDER — DESCOVY 200-25 MG PO TABS
1.0000 | ORAL_TABLET | Freq: Every day | ORAL | 5 refills | Status: AC
  Filled 2024-11-16 – 2024-11-18 (×2): qty 30, 30d supply, fill #0
  Filled 2024-12-17: qty 30, 30d supply, fill #1
  Filled 2025-01-21: qty 30, 30d supply, fill #2

## 2024-11-18 ENCOUNTER — Other Ambulatory Visit: Payer: Self-pay

## 2024-11-18 ENCOUNTER — Ambulatory Visit: Admitting: Internal Medicine

## 2024-11-18 ENCOUNTER — Encounter: Payer: Self-pay | Admitting: Internal Medicine

## 2024-11-18 VITALS — BP 124/91 | HR 58 | Temp 98.8°F | Wt 138.0 lb

## 2024-11-18 DIAGNOSIS — Z23 Encounter for immunization: Secondary | ICD-10-CM

## 2024-11-18 DIAGNOSIS — M5440 Lumbago with sciatica, unspecified side: Secondary | ICD-10-CM | POA: Diagnosis not present

## 2024-11-18 DIAGNOSIS — Z79899 Other long term (current) drug therapy: Secondary | ICD-10-CM | POA: Diagnosis not present

## 2024-11-18 DIAGNOSIS — B20 Human immunodeficiency virus [HIV] disease: Secondary | ICD-10-CM | POA: Diagnosis not present

## 2024-11-18 NOTE — Progress Notes (Signed)
 Specialty Pharmacy Refill Coordination Note  Abigail Medina is a 58 y.o. female contacted today regarding refills of specialty medication(s) Dolutegravir  Sodium (Tivicay ); Emtricitabine -Tenofovir  AF (Descovy )   Patient requested Delivery   Delivery date: 11/29/24   Verified address: [address] [place] [address]   Medication will be filled on: 11/26/24

## 2024-11-18 NOTE — Progress Notes (Unsigned)
 RFV: follow up for hiv disease  Patient ID: Abigail Medina, female   DOB: [birth year], 58 y.o.   MRN: [id number]  HPI 58yo F with hiv disease, CD 4 count 482/VL<20 ( in July 2025) on tivicay /descovy . Only missed one dose.  Last week noticed right hip /low back pain if standing too long. Had back pain x 2 days. Now improved  Outpatient Encounter Medications as of 11/18/2024  Medication Sig   albuterol  (VENTOLIN  HFA) 108 (90 Base) MCG/ACT inhaler Inhale 2 puffs into the lungs every 4 (four) hours as needed for wheezing or shortness of breath (cough, shortness of breath or wheezing.).   amLODipine  (NORVASC ) 10 MG tablet TAKE 1 TABLET BY MOUTH EVERY DAY   atorvastatin  (LIPITOR) 40 MG tablet TAKE 1 TABLET BY MOUTH EVERY DAY   cetirizine  (ZYRTEC ) 10 MG tablet TAKE 1 TABLET BY MOUTH EVERY DAY   cimetidine  (TAGAMET ) 200 MG tablet Take 1 tablet (200 mg total) by mouth 2 (two) times daily.   clopidogrel  (PLAVIX ) 75 MG tablet TAKE 1 TABLET BY MOUTH EVERY DAY   dolutegravir  (TIVICAY ) 50 MG tablet Take 1 tablet (50 mg total) by mouth daily.   emtricitabine -tenofovir  AF (DESCOVY ) 200-25 MG tablet Take 1 tablet by mouth daily.   fluticasone  (FLONASE ) 50 MCG/ACT nasal spray SPRAY 2 SPRAYS INTO EACH NOSTRIL EVERY DAY   hydrochlorothiazide  (HYDRODIURIL ) 25 MG tablet TAKE 1 TABLET (25 MG TOTAL) BY MOUTH DAILY.   hydrOXYzine  (ATARAX ) 10 MG tablet Take 10 mg by mouth 3 (three) times daily as needed.   meloxicam  (MOBIC ) 7.5 MG tablet Take 1 tablet (7.5 mg total) by mouth daily.   pantoprazole  (PROTONIX ) 40 MG tablet TAKE 1 TABLET BY MOUTH EVERY DAY   potassium chloride  SA (KLOR-CON  M) 20 MEQ tablet Take 1 tablet (20 mEq total) by mouth daily.   traZODone  (DESYREL ) 50 MG tablet TAKE 1 TABLET BY MOUTH EVERYDAY AT BEDTIME   triamcinolone  ointment (KENALOG ) 0.5 % Apply 1 application topically 2 (two) times daily.   Vitamin D , Ergocalciferol , (DRISDOL ) 1.25 MG (50000 UNIT) CAPS capsule TAKE 1 CAPSULE (50,000  UNITS TOTAL) BY MOUTH EVERY 7 (SEVEN) DAYS. WEEKLY   No facility-administered encounter medications on file as of 11/18/2024.     Patient Active Problem List   Diagnosis Date Noted   Perianal venereal warts 05/15/2023   Screening for cervical cancer 05/15/2023   Costochondral chest pain 11/28/2022   Alcohol dependence with uncomplicated intoxication (HCC) 02/04/2022   Left-sided weakness 09/07/2021   Paresthesia 09/06/2021   Healthcare maintenance 04/13/2019   Aneurysm of thoracic aorta 03/29/2019   Cerebral thrombosis with cerebral infarction 03/28/2019   CVA (cerebral vascular accident) (HCC) 03/28/2019   Acute focal neurological deficit 03/27/2019   Hyperbilirubinemia 03/27/2019   History of CVA with residual deficit    Pain of back and left lower extremity 12/15/2018   Ulnar neuritis 06/22/2015   Tobacco dependence 06/02/2015   Numbness and tingling of right arm 05/30/2015   Right arm weakness 05/30/2015   Arm numbness left 10/10/2014   Vitamin D  deficiency 08/05/2014   Allergic rhinitis 05/25/2013   Nasal septal perforation 05/25/2013   Polypharmacy 08/20/2012   HLD (hyperlipidemia) 07/15/2012   BP (high blood pressure) 07/15/2012   Cerebrovascular accident, late effects 01/30/2012   Depression with anxiety 07/15/2011   Hemiplegia, late effect of cerebrovascular disease (HCC) 01/25/2011   Benign neoplasm of skin 08/29/2010   Human immunodeficiency virus (HIV) disease (HCC) 08/06/2007   Essential hypertension 08/06/2007  Health Maintenance Due  Topic Date Due   Medicare Annual Wellness (AWV)  Never done   COVID-19 Vaccine (1) Never done   Zoster Vaccines- Shingrix (1 of 2) Never done   Pneumococcal Vaccine: 50+ Years (4 of 4 - PCV20 or PCV21) 04/09/2017   Cervical Cancer Screening (Pap smear)  05/14/2024   Influenza Vaccine  07/16/2024     Review of Systems  Physical Exam   BP (!) 124/91   Pulse (!) 58   Temp 98.8 F (37.1 C) (Oral)   Wt 138 lb (62.6  kg)   LMP 08/04/2014   SpO2 98%   BMI 24.45 kg/m   Physical Exam  Constitutional:  oriented to person, place, and time. appears well-developed and well-nourished. No distress.  HENT: [place]/AT, PERRLA, no scleral icterus Mouth/Throat: Oropharynx is clear and moist. No oropharyngeal exudate.  Cardiovascular: Normal rate, regular rhythm and normal heart sounds. Exam reveals no gallop and no friction rub.  No murmur heard.  Pulmonary/Chest: Effort normal and breath sounds normal. No respiratory distress.  has no wheezes.  Neck = supple, no nuchal rigidity Abdominal: Soft. Bowel sounds are normal.  exhibits no distension. There is no tenderness.  Lymphadenopathy: no cervical adenopathy. No axillary adenopathy Neurological: alert and oriented to person, place, and time.  Skin: Skin is warm and dry. No rash noted. No erythema.  Psychiatric: a normal mood and affect.  behavior is normal.   Lab Results  Component Value Date   CD4TCELL 41 07/15/2024   Lab Results  Component Value Date   CD4TABS 482 07/15/2024   CD4TABS 416 12/04/2023   CD4TABS 386 (L) 11/28/2022   Lab Results  Component Value Date   HIV1RNAQUANT NOT DETECTED 07/15/2024   Lab Results  Component Value Date   HEPBSAB NEG 03/10/2014   Lab Results  Component Value Date   LABRPR NON-REACTIVE 07/15/2024    CBC Lab Results  Component Value Date   WBC 3.2 (L) 07/15/2024   RBC 4.88 07/15/2024   HGB 15.6 (H) 07/15/2024   HCT 44.9 07/15/2024   PLT 163 07/15/2024   MCV 92.0 07/15/2024   MCH 32.0 07/15/2024   MCHC 34.7 07/15/2024   RDW 12.7 07/15/2024   LYMPHSABS 1.7 09/08/2021   MONOABS 0.4 09/08/2021   EOSABS 51 07/15/2024    BMET Lab Results  Component Value Date   NA 141 07/15/2024   K 3.2 (L) 07/15/2024   CL 102 07/15/2024   CO2 29 07/15/2024   GLUCOSE 84 07/15/2024   BUN 13 07/15/2024   CREATININE 0.72 07/15/2024   CALCIUM  9.4 07/15/2024   GFRNONAA >60 09/08/2021   GFRAA 94 10/18/2020       Assessment and Plan  Will do lumbar xray Labs Flu shot

## 2024-11-19 LAB — T-HELPER CELL (CD4) - (RCID CLINIC ONLY)
CD4 % Helper T Cell: 47 % (ref 33–65)
CD4 T Cell Abs: 444 /uL (ref 400–1790)

## 2024-11-20 LAB — HIV-1 RNA QUANT-NO REFLEX-BLD
HIV 1 RNA Quant: NOT DETECTED {copies}/mL
HIV-1 RNA Quant, Log: NOT DETECTED {Log_copies}/mL

## 2024-11-20 LAB — COMPLETE METABOLIC PANEL WITHOUT GFR
AG Ratio: 1.3 (calc) (ref 1.0–2.5)
ALT: 57 U/L — ABNORMAL HIGH (ref 6–29)
AST: 46 U/L — ABNORMAL HIGH (ref 10–35)
Albumin: 4.3 g/dL (ref 3.6–5.1)
Alkaline phosphatase (APISO): 82 U/L (ref 37–153)
BUN: 10 mg/dL (ref 7–25)
CO2: 30 mmol/L (ref 20–32)
Calcium: 9.5 mg/dL (ref 8.6–10.4)
Chloride: 102 mmol/L (ref 98–110)
Creat: 0.85 mg/dL (ref 0.50–1.05)
Globulin: 3.2 g/dL (ref 1.9–3.7)
Glucose, Bld: 104 mg/dL — ABNORMAL HIGH (ref 65–99)
Potassium: 3.2 mmol/L — ABNORMAL LOW (ref 3.5–5.3)
Sodium: 142 mmol/L (ref 135–146)
Total Bilirubin: 1.2 mg/dL (ref 0.2–1.2)
Total Protein: 7.5 g/dL (ref 6.1–8.1)

## 2024-11-20 LAB — CBC WITH DIFFERENTIAL/PLATELET
Absolute Lymphocytes: 1065 {cells}/uL (ref 850–3900)
Absolute Monocytes: 243 {cells}/uL (ref 200–950)
Basophils Absolute: 21 {cells}/uL (ref 0–200)
Basophils Relative: 0.7 %
Eosinophils Absolute: 51 {cells}/uL (ref 15–500)
Eosinophils Relative: 1.7 %
HCT: 45.2 % (ref 35.9–46.0)
Hemoglobin: 16 g/dL — ABNORMAL HIGH (ref 11.7–15.5)
MCH: 32.1 pg (ref 27.0–33.0)
MCHC: 35.4 g/dL (ref 31.6–35.4)
MCV: 90.8 fL (ref 81.4–101.7)
MPV: 10.9 fL (ref 7.5–12.5)
Monocytes Relative: 8.1 %
Neutro Abs: 1620 {cells}/uL (ref 1500–7800)
Neutrophils Relative %: 54 %
Platelets: 176 Thousand/uL (ref 140–400)
RBC: 4.98 Million/uL (ref 3.80–5.10)
RDW: 12.7 % (ref 11.0–15.0)
Total Lymphocyte: 35.5 %
WBC: 3 Thousand/uL — ABNORMAL LOW (ref 3.8–10.8)

## 2024-11-20 LAB — LIPID PANEL
Cholesterol: 160 mg/dL (ref <200)
HDL: 73 mg/dL (ref >=50)
LDL Cholesterol (Calc): 72 mg/dL
Non-HDL Cholesterol (Calc): 87 mg/dL (ref <130)
Total CHOL/HDL Ratio: 2.2 (calc) (ref <5.0)
Triglycerides: 72 mg/dL (ref <150)

## 2024-11-20 LAB — SYPHILIS: RPR W/REFLEX TO RPR TITER AND TREPONEMAL ANTIBODIES, TRADITIONAL SCREENING AND DIAGNOSIS ALGORITHM: RPR Ser Ql: NONREACTIVE

## 2024-11-25 ENCOUNTER — Ambulatory Visit
Admission: RE | Admit: 2024-11-25 | Discharge: 2024-11-25 | Disposition: A | Discharge status: Home / Self Care | Source: Ambulatory Visit | Attending: Internal Medicine | Admitting: Internal Medicine

## 2024-11-25 DIAGNOSIS — M5440 Lumbago with sciatica, unspecified side: Secondary | ICD-10-CM

## 2024-11-26 ENCOUNTER — Other Ambulatory Visit: Payer: Self-pay

## 2024-12-17 ENCOUNTER — Other Ambulatory Visit (HOSPITAL_COMMUNITY): Payer: Self-pay

## 2024-12-21 ENCOUNTER — Other Ambulatory Visit: Payer: Self-pay

## 2024-12-23 ENCOUNTER — Other Ambulatory Visit: Payer: Self-pay

## 2024-12-23 NOTE — Progress Notes (Signed)
 Specialty Pharmacy Refill Coordination Note  Abigail Medina is a 59 y.o. female contacted today regarding refills of specialty medication(s) Dolutegravir  Sodium (Tivicay ); Emtricitabine -Tenofovir  AF (Descovy )   Patient requested Delivery   Delivery date: 12/31/24   Verified address: [address] [place] [address]   Medication will be filled on: 12/30/24

## 2024-12-30 ENCOUNTER — Other Ambulatory Visit (HOSPITAL_COMMUNITY): Payer: Self-pay

## 2024-12-30 ENCOUNTER — Other Ambulatory Visit: Payer: Self-pay

## 2025-01-21 ENCOUNTER — Other Ambulatory Visit: Payer: Self-pay

## 2025-01-21 NOTE — Progress Notes (Signed)
 Specialty Pharmacy Refill Coordination Note  Abigail Medina is a 59 y.o. female contacted today regarding refills of specialty medication(s) Dolutegravir  Sodium (Tivicay ); Emtricitabine -Tenofovir  AF (Descovy )   Patient requested Delivery   Delivery date: 01/28/25   Verified address: [address] [place] [address]   Medication will be filled on: 01/27/25

## 2025-01-21 NOTE — Progress Notes (Signed)
 Specialty Pharmacy Ongoing Clinical Assessment Note  Abigail Medina is a 59 y.o. female who is being followed by the specialty pharmacy service for RxSp HIV   Patient's specialty medication(s) reviewed today: Dolutegravir  Sodium (Tivicay ); Emtricitabine -Tenofovir  AF (Descovy )   Missed doses in the last 4 weeks: 0   Patient/Caregiver did not have any additional questions or concerns.   Therapeutic benefit summary: Patient is achieving benefit   Adverse events/side effects summary: No adverse events/side effects   Patient's therapy is appropriate to: Continue    Goals Addressed             This Visit's Progress    Achieve Undetectable HIV Viral Load < 20   On track    Patient is on track. Patient will maintain adherence.  Patient's viral load remains undetectable as of 12//4/25          Follow up: 12 months  [facility] Specialty Pharmacist

## 2025-02-28 ENCOUNTER — Ambulatory Visit: Admitting: Dermatology

## 2025-05-12 ENCOUNTER — Ambulatory Visit: Admitting: Internal Medicine
